# Patient Record
Sex: Female | Born: 1965
Health system: Southern US, Community
[De-identification: ages and names within clinical notes are randomized; demographics above are authoritative.]

## PROBLEM LIST (undated history)

## (undated) DIAGNOSIS — E039 Hypothyroidism, unspecified: Secondary | ICD-10-CM

## (undated) DIAGNOSIS — S060XAA Concussion with loss of consciousness status unknown, initial encounter: Secondary | ICD-10-CM

## (undated) DIAGNOSIS — G4700 Insomnia, unspecified: Secondary | ICD-10-CM

## (undated) DIAGNOSIS — G459 Transient cerebral ischemic attack, unspecified: Secondary | ICD-10-CM

## (undated) DIAGNOSIS — E079 Disorder of thyroid, unspecified: Secondary | ICD-10-CM

## (undated) DIAGNOSIS — Z21 Asymptomatic human immunodeficiency virus [HIV] infection status: Secondary | ICD-10-CM

## (undated) DIAGNOSIS — J45909 Unspecified asthma, uncomplicated: Secondary | ICD-10-CM

## (undated) DIAGNOSIS — F32A Depression, unspecified: Secondary | ICD-10-CM

## (undated) DIAGNOSIS — M5030 Other cervical disc degeneration, unspecified cervical region: Secondary | ICD-10-CM

## (undated) DIAGNOSIS — I10 Essential (primary) hypertension: Secondary | ICD-10-CM

## (undated) DIAGNOSIS — G709 Myoneural disorder, unspecified: Secondary | ICD-10-CM

## (undated) DIAGNOSIS — F419 Anxiety disorder, unspecified: Secondary | ICD-10-CM

## (undated) DIAGNOSIS — B20 Human immunodeficiency virus [HIV] disease: Secondary | ICD-10-CM

## (undated) DIAGNOSIS — M1990 Unspecified osteoarthritis, unspecified site: Secondary | ICD-10-CM

## (undated) DIAGNOSIS — T7840XA Allergy, unspecified, initial encounter: Secondary | ICD-10-CM

## (undated) DIAGNOSIS — M797 Fibromyalgia: Secondary | ICD-10-CM

## (undated) DIAGNOSIS — Z5900 Homelessness unspecified: Secondary | ICD-10-CM

## (undated) DIAGNOSIS — Z226 Carrier of human T-lymphotropic virus type-1 [HTLV-1] infection: Secondary | ICD-10-CM

## (undated) DIAGNOSIS — S060X9A Concussion with loss of consciousness of unspecified duration, initial encounter: Secondary | ICD-10-CM

## (undated) DIAGNOSIS — F329 Major depressive disorder, single episode, unspecified: Secondary | ICD-10-CM

## (undated) HISTORY — DX: Allergy, unspecified, initial encounter: T78.40XA

## (undated) HISTORY — DX: Carrier of human t-lymphotropic virus type-1 (htlv-1) infection: Z22.6

## (undated) HISTORY — DX: Concussion with loss of consciousness of unspecified duration, initial encounter: S06.0X9A

## (undated) HISTORY — DX: Human immunodeficiency virus (HIV) disease: B20

## (undated) HISTORY — DX: Essential (primary) hypertension: I10

## (undated) HISTORY — DX: Asymptomatic human immunodeficiency virus (hiv) infection status: Z21

## (undated) HISTORY — DX: Homelessness unspecified: Z59.00

## (undated) HISTORY — DX: Other cervical disc degeneration, unspecified cervical region: M50.30

## (undated) HISTORY — DX: Disorder of thyroid, unspecified: E07.9

## (undated) HISTORY — DX: Insomnia, unspecified: G47.00

## (undated) HISTORY — DX: Unspecified osteoarthritis, unspecified site: M19.90

## (undated) HISTORY — DX: Myoneural disorder, unspecified: G70.9

## (undated) HISTORY — PX: BREAST BIOPSY: SHX20

## (undated) HISTORY — DX: Unspecified asthma, uncomplicated: J45.909

## (undated) HISTORY — DX: Depression, unspecified: F32.A

## (undated) HISTORY — DX: Concussion with loss of consciousness status unknown, initial encounter: S06.0XAA

## (undated) HISTORY — PX: ABDOMINAL HYSTERECTOMY: SHX81

## (undated) HISTORY — DX: Fibromyalgia: M79.7

## (undated) HISTORY — DX: Hypothyroidism, unspecified: E03.9

## (undated) HISTORY — DX: Anxiety disorder, unspecified: F41.9

## (undated) HISTORY — DX: Major depressive disorder, single episode, unspecified: F32.9

## (undated) HISTORY — DX: Homelessness: Z59.0

---
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
## 1995-06-26 HISTORY — PX: URACHAL CYST EXCISION: SUR579

## 1995-06-26 HISTORY — PX: HERNIA REPAIR: SHX51

## 2004-06-25 HISTORY — PX: CHOLECYSTECTOMY: SHX55

## 2013-06-25 HISTORY — PX: TUMOR EXCISION: SHX421

## 2015-03-29 DIAGNOSIS — M7711 Lateral epicondylitis, right elbow: Secondary | ICD-10-CM | POA: Insufficient documentation

## 2015-07-27 HISTORY — PX: COLONOSCOPY: SHX174

## 2016-09-29 ENCOUNTER — Emergency Department (HOSPITAL_COMMUNITY): Payer: Medicare (Managed Care)

## 2016-09-29 ENCOUNTER — Emergency Department (HOSPITAL_COMMUNITY)
Admission: EM | Admit: 2016-09-29 | Discharge: 2016-09-29 | Disposition: A | Discharge status: Home / Self Care | Payer: Medicare (Managed Care) | Attending: Emergency Medicine | Admitting: Emergency Medicine

## 2016-09-29 DIAGNOSIS — Z79899 Other long term (current) drug therapy: Secondary | ICD-10-CM | POA: Insufficient documentation

## 2016-09-29 DIAGNOSIS — R109 Unspecified abdominal pain: Secondary | ICD-10-CM

## 2016-09-29 DIAGNOSIS — M797 Fibromyalgia: Secondary | ICD-10-CM | POA: Diagnosis not present

## 2016-09-29 DIAGNOSIS — R319 Hematuria, unspecified: Secondary | ICD-10-CM | POA: Diagnosis not present

## 2016-09-29 LAB — CBC WITH DIFFERENTIAL/PLATELET
Basophils Absolute: 0.1 10*3/uL (ref 0.0–0.1)
Basophils Relative: 1 %
EOS ABS: 0.2 10*3/uL (ref 0.0–0.7)
EOS PCT: 2 %
HCT: 40.4 % (ref 36.0–46.0)
Hemoglobin: 13.1 g/dL (ref 12.0–15.0)
LYMPHS ABS: 4.9 10*3/uL — AB (ref 0.7–4.0)
Lymphocytes Relative: 47 %
MCH: 26.7 pg (ref 26.0–34.0)
MCHC: 32.4 g/dL (ref 30.0–36.0)
MCV: 82.4 fL (ref 78.0–100.0)
MONO ABS: 0.6 10*3/uL (ref 0.1–1.0)
Monocytes Relative: 6 %
Neutro Abs: 4.5 10*3/uL (ref 1.7–7.7)
Neutrophils Relative %: 44 %
PLATELETS: 362 10*3/uL (ref 150–400)
RBC: 4.9 MIL/uL (ref 3.87–5.11)
RDW: 13.5 % (ref 11.5–15.5)
WBC: 10.2 10*3/uL (ref 4.0–10.5)

## 2016-09-29 LAB — COMPREHENSIVE METABOLIC PANEL
ALK PHOS: 71 U/L (ref 38–126)
ALT: 15 U/L (ref 14–54)
AST: 19 U/L (ref 15–41)
Albumin: 3.8 g/dL (ref 3.5–5.0)
Anion gap: 11 (ref 5–15)
BUN: 7 mg/dL (ref 6–20)
CALCIUM: 9.3 mg/dL (ref 8.9–10.3)
CO2: 25 mmol/L (ref 22–32)
CREATININE: 0.89 mg/dL (ref 0.44–1.00)
Chloride: 104 mmol/L (ref 101–111)
Glucose, Bld: 95 mg/dL (ref 65–99)
Potassium: 3.6 mmol/L (ref 3.5–5.1)
SODIUM: 140 mmol/L (ref 135–145)
TOTAL PROTEIN: 7 g/dL (ref 6.5–8.1)
Total Bilirubin: 0.9 mg/dL (ref 0.3–1.2)

## 2016-09-29 LAB — RAPID URINE DRUG SCREEN, HOSP PERFORMED
Amphetamines: NOT DETECTED
Barbiturates: NOT DETECTED
Benzodiazepines: POSITIVE — AB
Cocaine: NOT DETECTED
OPIATES: NOT DETECTED
Tetrahydrocannabinol: NOT DETECTED

## 2016-09-29 LAB — URINALYSIS, ROUTINE W REFLEX MICROSCOPIC
BILIRUBIN URINE: NEGATIVE
Glucose, UA: NEGATIVE mg/dL
KETONES UR: NEGATIVE mg/dL
LEUKOCYTES UA: NEGATIVE
Nitrite: NEGATIVE
PH: 5 (ref 5.0–8.0)
Protein, ur: 30 mg/dL — AB
Specific Gravity, Urine: 1.026 (ref 1.005–1.030)

## 2016-09-29 LAB — LIPASE, BLOOD

## 2016-09-29 LAB — POC URINE PREG, ED: PREG TEST UR: NEGATIVE

## 2016-09-29 MED ORDER — MORPHINE SULFATE (PF) 4 MG/ML IV SOLN
4.0000 mg | Freq: Once | INTRAVENOUS | Status: AC
  Administered 2016-09-29: 4 mg via INTRAVENOUS
  Filled 2016-09-29: qty 1

## 2016-09-29 MED ORDER — PREDNISONE 20 MG PO TABS: 40.0000 mg | ORAL_TABLET | Freq: Every day | ORAL | 0 refills | Status: DC

## 2016-09-29 MED ORDER — OXYCODONE-ACETAMINOPHEN 5-325 MG PO TABS: 1.0000 | ORAL_TABLET | ORAL | 0 refills | Status: DC | PRN

## 2016-09-29 MED ORDER — KETOROLAC TROMETHAMINE 30 MG/ML IJ SOLN
30.0000 mg | Freq: Once | INTRAMUSCULAR | Status: AC
  Administered 2016-09-29: 30 mg via INTRAVENOUS
  Filled 2016-09-29: qty 1

## 2016-09-29 NOTE — ED Provider Notes (Signed)
[facility]-EMERGENCY DEPT Provider Note   CSN: [id number] Arrival date & time: 09/29/16  0143     History   Chief Complaint Chief Complaint  Patient presents with  . Flank Pain    HPI Brianna Roberts is a 51 y.o. female with a hx of HLT1, fibromyalgia, HTN, kidney stones presents to the Emergency Department complaining of gradual, persistent, progressively worsening right shoulder pain onset several months ago after an MRI.  She denies falls or trauma.  Pt reports taking Amitriptyline for her pain and using a tens unit without relief. She reports taking oxycodone for breakthrough pain, but has not taken the 1 tablet she has left.  Associated symptoms include right flank pain and dysuria onset 24 hrs ago.  Pt denies urinary urgency, frequency and hematuria.  She reports she normally requires Toradol and morphine for her pain.  Pt reports she relocated to the area 6 mos ago and has not yet found a PCP to manage her chronic conditions.  Nothing makes it better and movement and palpation makes it worse.  Pt denies fever, chills, headache, neck pain, chest pain, abd pain, N/V/D, weakness, dizziness, syncope.     The history is provided by the patient and medical records. No language interpreter was used.    No past medical history on file.  There are no active problems to display for this patient.   No past surgical history on file.  OB History    No data available       Home Medications    Prior to Admission medications   Medication Sig Start Date End Date Taking? Authorizing Provider  acyclovir (ZOVIRAX) 200 MG capsule Take 200 mg by mouth 2 (two) times daily.   Yes Historical Provider, MD  albuterol (PROVENTIL HFA;VENTOLIN HFA) 108 (90 Base) MCG/ACT inhaler Inhale 1-2 puffs into the lungs every 6 (six) hours as needed for wheezing or shortness of breath.   Yes Historical Provider, MD  albuterol (PROVENTIL) (2.5 MG/3ML) 0.083% nebulizer solution Take 2.5 mg by nebulization every 6 (six)  hours as needed for wheezing or shortness of breath.   Yes Historical Provider, MD  amitriptyline (ELAVIL) 50 MG tablet Take 50 mg by mouth at bedtime.   Yes Historical Provider, MD  budesonide-formoterol (SYMBICORT) 160-4.5 MCG/ACT inhaler Inhale 2 puffs into the lungs 2 (two) times daily.   Yes Historical Provider, MD  diazepam (VALIUM) 5 MG tablet Take 5 mg by mouth every 8 (eight) hours as needed for anxiety.   Yes Historical Provider, MD  fluticasone (FLONASE) 50 MCG/ACT nasal spray Place 1 spray into both nostrils daily.   Yes Historical Provider, MD  furosemide (LASIX) 40 MG tablet Take 40 mg by mouth 2 (two) times daily.   Yes Historical Provider, MD  hydrOXYzine (ATARAX/VISTARIL) 10 MG tablet Take 10 mg by mouth at bedtime.   Yes Historical Provider, MD  ipratropium-albuterol (DUONEB) 0.5-2.5 (3) MG/3ML SOLN Take 3 mLs by nebulization every 6 (six) hours as needed (for breathing).   Yes Historical Provider, MD  levothyroxine (SYNTHROID, LEVOTHROID) 25 MCG tablet Take 25 mcg by mouth daily before breakfast.   Yes Historical Provider, MD  losartan (COZAAR) 100 MG tablet Take 100 mg by mouth daily.   Yes Historical Provider, MD  metoprolol (LOPRESSOR) 100 MG tablet Take 100 mg by mouth 2 (two) times daily.   Yes Historical Provider, MD  oxyCODONE (OXY IR/ROXICODONE) 5 MG immediate release tablet Take 5 mg by mouth every 4 (four) hours as needed for severe  pain.   Yes Historical Provider, MD  potassium chloride SA (K-DUR,KLOR-CON) 20 MEQ tablet Take 20 mEq by mouth 2 (two) times daily.   Yes Historical Provider, MD  Probiotic Product (ALIGN) 4 MG CAPS Take 4 mg by mouth daily.   Yes Historical Provider, MD  sertraline (ZOLOFT) 100 MG tablet Take 100 mg by mouth daily.   Yes Historical Provider, MD  oxyCODONE-acetaminophen (PERCOCET/ROXICET) 5-325 MG tablet Take 1 tablet by mouth every 4 (four) hours as needed for severe pain. 09/29/16   Kanaya Gunnarson, PA-C  predniSONE (DELTASONE) 20 MG  tablet Take 2 tablets (40 mg total) by mouth daily. 09/29/16   Dahlia Client Hendrick Pavich, PA-C    Family History No family history on file.  Social History Social History  Substance Use Topics  . Smoking status: Not on file  . Smokeless tobacco: Not on file  . Alcohol use Not on file     Allergies   Sulfa antibiotics; Codeine; Dilaudid [hydromorphone hcl]; Penicillins; and Spironolactone   Review of Systems Review of Systems  Constitutional: Negative for chills and fever.  Respiratory: Negative for shortness of breath.   Gastrointestinal: Negative for abdominal pain, diarrhea and nausea.  Genitourinary: Positive for flank pain. Negative for dysuria and urgency.  Musculoskeletal: Positive for arthralgias (right shoulder) and myalgias. Negative for gait problem and neck pain.  All other systems reviewed and are negative.    Physical Exam Updated Vital Signs BP 128/74   Pulse 93   Temp 98.3 F (36.8 C) (Oral)   Resp 18   Ht  (1.753 m)   Wt (!) 147.9 kg   SpO2 95%   BMI 48.14 kg/m   Physical Exam  Constitutional: She appears well-developed and well-nourished. No distress.  Awake, alert, nontoxic appearance Obese  HENT:  Head: Normocephalic and atraumatic.  Mouth/Throat: Oropharynx is clear and moist. No oropharyngeal exudate.  Eyes: Conjunctivae are normal. No scleral icterus.  Neck: Normal range of motion. Neck supple.  Cardiovascular: Normal rate, regular rhythm and intact distal pulses.   Pulmonary/Chest: Effort normal and breath sounds normal. No respiratory distress. She has no wheezes.  Equal chest expansion  Abdominal: Soft. Bowel sounds are normal. She exhibits no mass. There is no tenderness. There is CVA tenderness. There is no rebound and no guarding.  Musculoskeletal: Normal range of motion. She exhibits no edema.  Decreased ROM of the C, T and L spine due to pain Pt refuses to move or use her right arm stating it is too painful, but is able to assist  her left arm with removing her shirt.; no swelling, erythema or induration; no deformity.  Pt winces and pulls back at the slightest touch of her right arm, shoulder, and paraspinal muscles  Neurological: She is alert.  Speech is clear and goal oriented Moves extremities without ataxia  Skin: Skin is warm and dry. She is not diaphoretic.  Psychiatric: Her mood appears anxious.  Nursing note and vitals reviewed.    ED Treatments / Results  Labs (all labs ordered are listed, but only abnormal results are displayed) Labs Reviewed  URINALYSIS, ROUTINE W REFLEX MICROSCOPIC - Abnormal; Notable for the following:       Result Value   APPearance HAZY (*)    Hgb urine dipstick SMALL (*)    Protein, ur 30 (*)    Bacteria, UA FEW (*)    Squamous Epithelial / LPF 0-5 (*)    All other components within normal limits  CBC WITH DIFFERENTIAL/PLATELET -  Abnormal; Notable for the following:    Lymphs Abs 4.9 (*)    All other components within normal limits  LIPASE, BLOOD - Abnormal; Notable for the following:    Lipase <10 (*)    All other components within normal limits  RAPID URINE DRUG SCREEN, HOSP PERFORMED - Abnormal; Notable for the following:    Benzodiazepines POSITIVE (*)    All other components within normal limits  COMPREHENSIVE METABOLIC PANEL  POC URINE PREG, ED    Radiology Ct Renal Stone Study  Result Date: 09/29/2016 CLINICAL DATA:  Right flank pain x1 month.  History of renal stones. EXAM: CT ABDOMEN AND PELVIS WITHOUT CONTRAST TECHNIQUE: Multidetector CT imaging of the abdomen and pelvis was performed following the standard protocol without IV contrast. COMPARISON:  None. FINDINGS: Lower chest: Normal visualized cardiac chamber size without pericardial effusion. Clear lung bases. Hepatobiliary: Fatty infiltration of the liver. Cholecystectomy. No space-occupying mass of the liver noted. No biliary dilatation is visualized. Pancreas: Unremarkable. No pancreatic ductal dilatation  or surrounding inflammatory changes. Spleen: Normal in size without focal abnormality. Adrenals/Urinary Tract: Normal bilateral adrenal glands. No hydroureteronephrosis on either side. No obstructing calculus. A few punctate calcifications are suggested bilaterally without obstruction. These may represent tiny renal calculi or vascular calcifications. The urinary bladder is physiologically distended without stones or mass. Stomach/Bowel: Stomach is within normal limits. Appendix appears normal. No evidence of bowel wall thickening, distention, or inflammatory changes. Vascular/Lymphatic: No significant vascular findings are present. No enlarged abdominal or pelvic lymph nodes. Reproductive: Status post hysterectomy. No adnexal masses. Other: Pelvic calcifications consistent phleboliths are noted in the lower pelvis, one just anterior to the region the urethra but believed not to be within the urethra lack of any signs of collecting system or bladder obstruction. Musculoskeletal: No acute or significant osseous findings. IMPRESSION: 1. No obstructive uropathy. Tiny punctate calcifications are suggested within both kidneys which may represent vascular or nonobstructing calcifications. 2. No acute bowel inflammation. 3. Cholecystectomy and hysterectomy. 4. Normal-appearing appendix. Electronically Signed   By: Tollie Eth M.D.   On: 09/29/2016 03:31    Procedures Procedures (including critical care time)  Medications Ordered in ED Medications  morphine 4 MG/ML injection 4 mg (4 mg Intravenous Given 09/29/16 0259)  ketorolac (TORADOL) 30 MG/ML injection 30 mg (30 mg Intravenous Given 09/29/16 0418)     Initial Impression / Assessment and Plan / ED Course  I have reviewed the triage vital signs and the nursing notes.  Pertinent labs & imaging results that were available during my care of the patient were reviewed by me and considered in my medical decision making (see chart for details).     Labs  reassuring. No leukocytosis. Urine with small amount hemoglobin. Urine exiting positive for benzodiazepines. CT scan is without obstructive uropathy.  There is no acute bowel inflammation and normal appearing appendix. Patient's pain managed with morphine and Toradol per her request.  She is requesting prednisone at discharge as this helps her fibromyalgia. Patient without known diabetes. Blood sugar within normal today. Will give burst. Patient also discharged with small prescription of oxycodone. Long discussion about need for primary care physician.  The ED will not refill her oxycodone for chronic pain. She will be discharged home to follow-up with primary care physician.  BP 128/74   Pulse 93   Temp 98.3 F (36.8 C) (Oral)   Resp 18   Ht  (1.753 m)   Wt (!) 147.9 kg   SpO2 95%  BMI 48.14 kg/m    Final Clinical Impressions(s) / ED Diagnoses   Final diagnoses:  Right flank pain  Fibromyalgia affecting shoulder region  Hematuria, unspecified type    New Prescriptions New Prescriptions   OXYCODONE-ACETAMINOPHEN (PERCOCET/ROXICET) 5-325 MG TABLET    Take 1 tablet by mouth every 4 (four) hours as needed for severe pain.   PREDNISONE (DELTASONE) 20 MG TABLET    Take 2 tablets (40 mg total) by mouth daily.     Dahlia Client Recardo Linn, PA-C 09/29/16 0430    Melene Plan, DO 09/29/16 [phone]

## 2016-09-29 NOTE — ED Notes (Signed)
Patient transported to CT 

## 2016-09-29 NOTE — ED Triage Notes (Signed)
Pt complaining of R flank pain x 1 month. Pt states hx of kidney stones, states feels same. Pt also states need PCP. Told to see PCP monthly, has not had a PCP x 6 months. Pt with multiple complaints.

## 2016-09-29 NOTE — Discharge Instructions (Signed)
1. Medications: prednisone, oxycodone, usual home medications 2. Treatment: rest, drink plenty of fluids,  3. Follow Up: Please followup with your primary doctor in 2-3 days for discussion of your diagnoses and further evaluation after today's visit; if you do not have a primary care doctor use the resource guide provided to find one; Please return to the ER for high fevers, intractable vomiting, worsening symptoms

## 2016-10-05 ENCOUNTER — Ambulatory Visit (INDEPENDENT_AMBULATORY_CARE_PROVIDER_SITE_OTHER): Payer: Medicare Other | Admitting: Physician Assistant

## 2016-10-05 VITALS — BP 144/86 | HR 75 | Temp 99.3°F | Resp 17 | Ht 68.5 in | Wt 328.0 lb

## 2016-10-05 DIAGNOSIS — F419 Anxiety disorder, unspecified: Secondary | ICD-10-CM | POA: Insufficient documentation

## 2016-10-05 DIAGNOSIS — I10 Essential (primary) hypertension: Secondary | ICD-10-CM | POA: Insufficient documentation

## 2016-10-05 DIAGNOSIS — R600 Localized edema: Secondary | ICD-10-CM | POA: Diagnosis not present

## 2016-10-05 DIAGNOSIS — K580 Irritable bowel syndrome with diarrhea: Secondary | ICD-10-CM

## 2016-10-05 DIAGNOSIS — N83201 Unspecified ovarian cyst, right side: Secondary | ICD-10-CM

## 2016-10-05 DIAGNOSIS — M7711 Lateral epicondylitis, right elbow: Secondary | ICD-10-CM

## 2016-10-05 DIAGNOSIS — E039 Hypothyroidism, unspecified: Secondary | ICD-10-CM | POA: Diagnosis not present

## 2016-10-05 DIAGNOSIS — F0781 Postconcussional syndrome: Secondary | ICD-10-CM

## 2016-10-05 DIAGNOSIS — G43809 Other migraine, not intractable, without status migrainosus: Secondary | ICD-10-CM

## 2016-10-05 DIAGNOSIS — M797 Fibromyalgia: Secondary | ICD-10-CM

## 2016-10-05 DIAGNOSIS — J309 Allergic rhinitis, unspecified: Secondary | ICD-10-CM

## 2016-10-05 DIAGNOSIS — Z0100 Encounter for examination of eyes and vision without abnormal findings: Secondary | ICD-10-CM

## 2016-10-05 DIAGNOSIS — N83209 Unspecified ovarian cyst, unspecified side: Secondary | ICD-10-CM | POA: Insufficient documentation

## 2016-10-05 DIAGNOSIS — Z6841 Body Mass Index (BMI) 40.0 and over, adult: Secondary | ICD-10-CM

## 2016-10-05 DIAGNOSIS — B028 Zoster with other complications: Secondary | ICD-10-CM

## 2016-10-05 DIAGNOSIS — G9332 Myalgic encephalomyelitis/chronic fatigue syndrome: Secondary | ICD-10-CM | POA: Insufficient documentation

## 2016-10-05 DIAGNOSIS — M5030 Other cervical disc degeneration, unspecified cervical region: Secondary | ICD-10-CM

## 2016-10-05 DIAGNOSIS — K589 Irritable bowel syndrome without diarrhea: Secondary | ICD-10-CM | POA: Insufficient documentation

## 2016-10-05 DIAGNOSIS — J3089 Other allergic rhinitis: Secondary | ICD-10-CM

## 2016-10-05 DIAGNOSIS — Z09 Encounter for follow-up examination after completed treatment for conditions other than malignant neoplasm: Secondary | ICD-10-CM

## 2016-10-05 DIAGNOSIS — B333 Retrovirus infections, not elsewhere classified: Secondary | ICD-10-CM | POA: Diagnosis not present

## 2016-10-05 DIAGNOSIS — G43909 Migraine, unspecified, not intractable, without status migrainosus: Secondary | ICD-10-CM | POA: Insufficient documentation

## 2016-10-05 DIAGNOSIS — R5382 Chronic fatigue, unspecified: Secondary | ICD-10-CM

## 2016-10-05 DIAGNOSIS — Z01419 Encounter for gynecological examination (general) (routine) without abnormal findings: Secondary | ICD-10-CM

## 2016-10-05 DIAGNOSIS — N200 Calculus of kidney: Secondary | ICD-10-CM

## 2016-10-05 DIAGNOSIS — Z7689 Persons encountering health services in other specified circumstances: Secondary | ICD-10-CM

## 2016-10-05 DIAGNOSIS — B029 Zoster without complications: Secondary | ICD-10-CM | POA: Insufficient documentation

## 2016-10-05 DIAGNOSIS — A609 Anogenital herpesviral infection, unspecified: Secondary | ICD-10-CM

## 2016-10-05 DIAGNOSIS — F329 Major depressive disorder, single episode, unspecified: Secondary | ICD-10-CM

## 2016-10-05 DIAGNOSIS — L509 Urticaria, unspecified: Secondary | ICD-10-CM

## 2016-10-05 DIAGNOSIS — F32A Depression, unspecified: Secondary | ICD-10-CM | POA: Insufficient documentation

## 2016-10-05 DIAGNOSIS — G4733 Obstructive sleep apnea (adult) (pediatric): Secondary | ICD-10-CM | POA: Insufficient documentation

## 2016-10-05 DIAGNOSIS — J45909 Unspecified asthma, uncomplicated: Secondary | ICD-10-CM

## 2016-10-05 DIAGNOSIS — G4700 Insomnia, unspecified: Secondary | ICD-10-CM

## 2016-10-05 DIAGNOSIS — R531 Weakness: Secondary | ICD-10-CM

## 2016-10-05 HISTORY — DX: Localized edema: R60.0

## 2016-10-05 HISTORY — DX: Unspecified asthma, uncomplicated: J45.909

## 2016-10-05 HISTORY — DX: Allergic rhinitis, unspecified: J30.9

## 2016-10-05 HISTORY — DX: Migraine, unspecified, not intractable, without status migrainosus: G43.909

## 2016-10-05 HISTORY — DX: Unspecified ovarian cyst, unspecified side: N83.209

## 2016-10-05 MED ORDER — ARIPIPRAZOLE 10 MG PO TABS: 10.0000 mg | ORAL_TABLET | Freq: Every day | ORAL | 0 refills | Status: DC

## 2016-10-05 MED ORDER — LEVOTHYROXINE SODIUM 25 MCG PO TABS: 25.0000 ug | ORAL_TABLET | Freq: Every day | ORAL | 3 refills | Status: DC

## 2016-10-05 MED ORDER — ALBUTEROL SULFATE HFA 108 (90 BASE) MCG/ACT IN AERS: 1.0000 | INHALATION_SPRAY | Freq: Four times a day (QID) | RESPIRATORY_TRACT | 3 refills | Status: DC | PRN

## 2016-10-05 MED ORDER — DIAZEPAM 5 MG PO TABS: 5.0000 mg | ORAL_TABLET | Freq: Three times a day (TID) | ORAL | 0 refills | Status: DC | PRN

## 2016-10-05 MED ORDER — ACYCLOVIR 200 MG PO CAPS: 200.0000 mg | ORAL_CAPSULE | Freq: Two times a day (BID) | ORAL | 3 refills | Status: DC

## 2016-10-05 MED ORDER — SERTRALINE HCL 100 MG PO TABS
100.0000 mg | ORAL_TABLET | Freq: Every day | ORAL | 3 refills | Status: DC
Start: 2016-10-05 — End: 2017-08-09

## 2016-10-05 MED ORDER — FUROSEMIDE 40 MG PO TABS: 40.0000 mg | ORAL_TABLET | Freq: Two times a day (BID) | ORAL | 3 refills | Status: DC

## 2016-10-05 MED ORDER — FLUTICASONE PROPIONATE 50 MCG/ACT NA SUSP: 1.0000 | Freq: Every day | NASAL | 3 refills | Status: DC

## 2016-10-05 MED ORDER — OXYCODONE HCL 5 MG PO TABS: 5.0000 mg | ORAL_TABLET | ORAL | 0 refills | Status: DC | PRN

## 2016-10-05 MED ORDER — LOSARTAN POTASSIUM 100 MG PO TABS
100.0000 mg | ORAL_TABLET | Freq: Every day | ORAL | 3 refills | Status: DC
Start: 2016-10-05 — End: 2017-08-09

## 2016-10-05 MED ORDER — HYDROXYZINE HCL 10 MG PO TABS: 10.0000 mg | ORAL_TABLET | Freq: Every day | ORAL | 3 refills | Status: DC

## 2016-10-05 MED ORDER — METOPROLOL TARTRATE 100 MG PO TABS: 100.0000 mg | ORAL_TABLET | Freq: Two times a day (BID) | ORAL | 3 refills | Status: DC

## 2016-10-05 MED ORDER — IPRATROPIUM-ALBUTEROL 0.5-2.5 (3) MG/3ML IN SOLN: 3.0000 mL | Freq: Four times a day (QID) | RESPIRATORY_TRACT | 3 refills | Status: DC | PRN

## 2016-10-05 MED ORDER — AMITRIPTYLINE HCL 50 MG PO TABS: 50.0000 mg | ORAL_TABLET | Freq: Every day | ORAL | 3 refills | Status: DC

## 2016-10-05 NOTE — Progress Notes (Signed)
Patient ID: Brianna Roberts, female     DOB: 1966/01/31, 51 y.o.    MRN: [id number]  PCP: No PCP Per Patient  Chief Complaint  Patient presents with  . Establish Care  . Hospitalization Follow-up    Subjective:   This patient is new to this practice and presents for medication refills, hospital follow-up and to establish care. She is accompanied by her nurse case manager, [place].  History is provided by the patient, who is a fair-to-good historian and through review in [place].  She was seen in the ED with flank pain, thought due to nephrolithiasis, on 09/29/2016 and encouraged to establish for primary care. She also needs multiple specialty referrals. In [place] she was followed by: PCP, rheumatology, infectious disease, neurology, pain management, GYN, GI, orthopedics, psychiatry/psychotherapy, ophthalmology, allergy, sleep medicine and had regular PT/OT.  Husband died of lymphoma in 2012/08/04, due to HTLV-1 in August 04, 2012. She is enrolled in an HTLV study at the [facility]. Moved here from [place] because she needed a change. Has lost about 60 lbs, working to lose more.  "I need to get out of this flare up. It's been 3 months and I'm tired of it."  Had her records sent to another practice where she was to establish, but the provider there declined to see her due to the complexity of her medical issues.  Review of Systems  Constitutional: Positive for fatigue. Negative for activity change, appetite change, chills, diaphoresis, fever and unexpected weight change.  HENT: Positive for congestion, postnasal drip, rhinorrhea and sinus pressure. Negative for dental problem, drooling, ear discharge, ear pain, facial swelling, hearing loss, mouth sores, nosebleeds, sinus pain, sneezing, sore throat, tinnitus, trouble swallowing and voice change.        Allergies are worse here than in [place]  Eyes: Negative.   Respiratory: Negative for apnea, cough, choking, chest tightness, shortness of  breath, wheezing and stridor.        Asthma is controlled  Cardiovascular: Positive for leg swelling. Negative for chest pain and palpitations.  Gastrointestinal: Negative for abdominal distention, abdominal pain, anal bleeding, blood in stool, constipation, diarrhea, nausea, rectal pain and vomiting.       IBS-D is controlled  Endocrine: Negative.   Genitourinary: Negative.   Musculoskeletal: Positive for myalgias. Negative for gait problem and joint swelling.  Allergic/Immunologic: Positive for environmental allergies and immunocompromised state. Negative for food allergies.  Neurological: Positive for weakness (LEFT sided) and headaches.  Hematological: Negative.   Psychiatric/Behavioral: Positive for dysphoric mood and sleep disturbance. The patient is nervous/anxious.    Depression screen PHQ 2/9 10/05/2016  Decreased Interest 0  Down, Depressed, Hopeless 1  PHQ - 2 Score 1     Prior to Admission medications   Medication Sig Start Date End Date Taking? Authorizing Provider  acyclovir (ZOVIRAX) 200 MG capsule Take 200 mg by mouth 2 (two) times daily.   Yes Historical Provider, MD  albuterol (PROVENTIL HFA;VENTOLIN HFA) 108 (90 Base) MCG/ACT inhaler Inhale 1-2 puffs into the lungs every 6 (six) hours as needed for wheezing or shortness of breath.   Yes Historical Provider, MD  albuterol (PROVENTIL) (2.5 MG/3ML) 0.083% nebulizer solution Take 2.5 mg by nebulization every 6 (six) hours as needed for wheezing or shortness of breath.   Yes Historical Provider, MD  amitriptyline (ELAVIL) 50 MG tablet Take 50 mg by mouth at bedtime.   Yes Historical Provider, MD  ARIPiprazole (ABILIFY) 10 MG tablet Take 10 mg by mouth daily.  Yes Historical Provider, MD  budesonide-formoterol (SYMBICORT) 160-4.5 MCG/ACT inhaler Inhale 2 puffs into the lungs 2 (two) times daily.   Yes Historical Provider, MD  diazepam (VALIUM) 5 MG tablet Take 5 mg by mouth every 8 (eight) hours as needed for anxiety.   Yes  Historical Provider, MD  fluticasone (FLONASE) 50 MCG/ACT nasal spray Place 1 spray into both nostrils daily.   Yes Historical Provider, MD  furosemide (LASIX) 40 MG tablet Take 40 mg by mouth 2 (two) times daily.   Yes Historical Provider, MD  hydrOXYzine (ATARAX/VISTARIL) 10 MG tablet Take 10 mg by mouth at bedtime.   Yes Historical Provider, MD  ipratropium-albuterol (DUONEB) 0.5-2.5 (3) MG/3ML SOLN Take 3 mLs by nebulization every 6 (six) hours as needed (for breathing).   Yes Historical Provider, MD  levothyroxine (SYNTHROID, LEVOTHROID) 25 MCG tablet Take 25 mcg by mouth daily before breakfast.   Yes Historical Provider, MD  losartan (COZAAR) 100 MG tablet Take 100 mg by mouth daily.   Yes Historical Provider, MD  metoprolol (LOPRESSOR) 100 MG tablet Take 100 mg by mouth 2 (two) times daily.   Yes Historical Provider, MD  oxyCODONE (OXY IR/ROXICODONE) 5 MG immediate release tablet Take 5 mg by mouth every 4 (four) hours as needed for severe pain.   Yes Historical Provider, MD  oxyCODONE-acetaminophen (PERCOCET/ROXICET) 5-325 MG tablet Take 1 tablet by mouth every 4 (four) hours as needed for severe pain. 09/29/16  Yes Hannah Muthersbaugh, PA-C  potassium chloride SA (K-DUR,KLOR-CON) 20 MEQ tablet Take 20 mEq by mouth 2 (two) times daily.   Yes Historical Provider, MD  predniSONE (DELTASONE) 20 MG tablet Take 2 tablets (40 mg total) by mouth daily. 09/29/16  Yes Hannah Muthersbaugh, PA-C  Probiotic Product (ALIGN) 4 MG CAPS Take 4 mg by mouth daily.   Yes Historical Provider, MD  sertraline (ZOLOFT) 100 MG tablet Take 100 mg by mouth daily.   Yes Historical Provider, MD     Allergies  Allergen Reactions  . Sulfa Antibiotics Anaphylaxis  . Codeine Other (See Comments)    Unknown   . Dilaudid [Hydromorphone Hcl] Itching  . Penicillins Other (See Comments)    Unknown   . Spironolactone Other (See Comments)    "GI Issues"     Patient Active Problem List   Diagnosis Date Noted  . 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 Fibromyalgia 10/05/2016  . Benign essential HTN 10/05/2016  . Hypothyroidism 10/05/2016  . Allergic rhinitis 10/05/2016  . Urticaria 10/05/2016  . Chronic fatigue syndrome 10/05/2016  . Asthma, chronic 10/05/2016  . HSV (herpes simplex virus) anogenital infection 10/05/2016  . Shingles 10/05/2016  . Anxiety and depression 10/05/2016  . Bilateral leg edema 10/05/2016  . Ovarian cyst 10/05/2016  . Nephrolithiasis 10/05/2016  . IBS (irritable bowel syndrome) 10/05/2016  . OSA (obstructive sleep apnea) 10/05/2016  . Left-sided weakness 10/05/2016  . Migraine 10/05/2016  . Arm weakness 03/29/2015     Family History  Problem Relation Age of Onset  . Cancer Mother 74    breast cancer  . Diabetes Mother   . Heart disease Mother   . Hyperlipidemia Mother   . Hypertension Mother   . Stroke Mother   . Mental illness Mother   . Heart failure Mother   . Headache Mother     Rocky Morel Cyst  . Heart disease Father   . Alcohol abuse Father   . Hypertension Sister   . Heart disease Brother   .  Mental illness Brother   . COPD Brother   . Heart failure Brother   . Hypertension Sister      Social History   Social History  . Marital status: Widowed    Spouse name: Ferdie Ping  . Number of children: 0  . Years of education: college+   Occupational History  . disability     fibromyalgia   Social History Main Topics  . Smoking status: Current Every Day Smoker    Packs/day: 0.30    Years: 33.00    Types: Cigarettes  . Smokeless tobacco: Never Used  . Alcohol use No  . Drug use: No  . Sexual activity: No   Other Topics Concern  . Not on file   Social History Narrative   Moved to [place], [place] from [place], [place] 04/28/2016 in need of a change.   Her family remains in [place].   Lives alone.         Objective:  Physical Exam  Constitutional: She is oriented to person, place, and time.  She appears well-developed and well-nourished. She is active and cooperative. No distress.  BP (!) 144/86 (BP Location: Left Arm, Patient Position: Sitting, Cuff Size: Large)   Pulse 75   Temp 99.3 F (37.4 C) (Oral)   Resp 17   Ht 5' 8.5" (1.74 m)   Wt (!) 328 lb (148.8 kg)   SpO2 94%   BMI 49.15 kg/m    Eyes: Conjunctivae are normal.  Pulmonary/Chest: Effort normal.  Neurological: She is alert and oriented to person, place, and time.  Psychiatric: She has a normal mood and affect. Her speech is normal and behavior is normal.             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      Relevant Medications   acyclovir (ZOVIRAX) 200 MG capsule   Other Relevant Orders   Ambulatory referral to Infectious Disease   Fibromyalgia    Continue amitriptyline. Refer to rheumatology. Care Everywhere notes seropositive RA, but the patient does not recall that, only that her previous clinician told her that the studies were normal. I see Sjogren's antibodies (negative), ANA (negative), ESR (11).      Relevant Medications   amitriptyline (ELAVIL) 50 MG tablet   oxyCODONE (OXY IR/ROXICODONE) 5 MG immediate release tablet   Other Relevant Orders   Ambulatory referral to Rheumatology   Benign essential HTN    Above goal today, but records indicate previous control. Continue current regimen, encourage consistent CPAP use. If remains elevated at next visit, will increase drug therapy.      Relevant Medications   metoprolol (LOPRESSOR) 100 MG tablet   losartan (COZAAR) 100 MG tablet   furosemide (LASIX) 40 MG tablet   Hypothyroidism    TSH was normal in 12/2015. Continue low dose levothyroxine. Will repeat when update other labs.      Relevant Medications   metoprolol (LOPRESSOR) 100 MG tablet   levothyroxine (SYNTHROID,  LEVOTHROID) 25 MCG tablet   Allergic rhinitis    Worse since relocating here from [place], but stable. Continue current treatment.      Relevant Medications   fluticasone (FLONASE) 50 MCG/ACT nasal spray   Urticaria    Will review Care Everywhere documents to understand work-up thus far and rationale for not prescribing an  Epipen. Though if she is really experiencing sensation that her throat is closing several times a week, trigger is certainly needed for avoidance/treatment.      Relevant Medications   hydrOXYzine (ATARAX/VISTARIL) 10 MG tablet   Other Relevant Orders   Ambulatory referral to Allergy   Chronic fatigue syndrome    Not discussed at this visit.      Relevant Orders   Ambulatory referral to Allergy   Asthma, chronic    Controlled. Recent prednisone for unrelated issue likely helped with exacerbation due to increased allergy symptoms. Continue current treatment. Refer to local allergy specialist.      Relevant Medications   ipratropium-albuterol (DUONEB) 0.5-2.5 (3) MG/3ML SOLN   albuterol (PROVENTIL HFA;VENTOLIN HFA) 108 (90 Base) MCG/ACT inhaler   HSV (herpes simplex virus) anogenital infection    Continue acyclovir.      Relevant Medications   acyclovir (ZOVIRAX) 200 MG capsule   Shingles    Will likely recommend Shingrix.      Relevant Medications   acyclovir (ZOVIRAX) 200 MG capsule   Anxiety and depression    Continue current treatment for now. Refer to establish with local psychiatry and psychology.      Relevant Medications   sertraline (ZOLOFT) 100 MG tablet   diazepam (VALIUM) 5 MG tablet   ARIPiprazole (ABILIFY) 10 MG tablet   Other Relevant Orders   Ambulatory referral to Psychiatry   Bilateral leg edema    Continue Lasix.      Relevant Medications   furosemide (LASIX) 40 MG tablet   Ovarian cyst    Can be evaluated with GYN.      Nephrolithiasis    Given recurrent symptoms and no objective finding beyond hematuria, recommend  urology evaluation.      Relevant Medications   oxyCODONE (OXY IR/ROXICODONE) 5 MG immediate release tablet   Other Relevant Orders   Ambulatory referral to Urology   Right lateral epicondylitis    Requests orthopedics referral, but wants to hold off initially, given the multiple other specialists she'll be establishing with.      Relevant Medications   oxyCODONE (OXY IR/ROXICODONE) 5 MG immediate release tablet   IBS (irritable bowel syndrome)    Currently stable. Refer to GI to establish local specialist. Colonoscopy 10/03/2015.      Relevant Orders   Ambulatory referral to Gastroenterology   OSA (obstructive sleep apnea)    Continue efforts for weight loss and consistent CPAP use. Requests referral to local sleep specialist. Is working with her nurse case manager to get replacement parts for her set up that have malfunctioned.      Relevant Orders   Ambulatory referral to Sleep Studies   Left-sided weakness    This diagnosis was obtained via Care Everywhere and was not discussed with the patient at this visit.      Relevant Orders   Ambulatory referral to Neurology   Migraine    Refer to neurology. Encourage consistent CPAP use. Continue amitriptyline.      Relevant Medications   sertraline (ZOLOFT) 100 MG tablet   metoprolol (LOPRESSOR) 100 MG tablet   losartan (COZAAR) 100 MG tablet   furosemide (LASIX) 40 MG tablet   amitriptyline (ELAVIL) 50 MG tablet   oxyCODONE (OXY IR/ROXICODONE) 5 MG immediate release tablet   Other Relevant Orders   Ambulatory referral to Neurology   DDD (degenerative disc disease), cervical    This diagnosis was obtained via review of Care Everywhere and not discussed with the patient at  this visit.      Relevant Medications   oxyCODONE (OXY IR/ROXICODONE) 5 MG immediate release tablet   Insomnia    No specific treatment used. No new treatment provided.      BMI 45.0-49.9, adult [facility])    Certainly contributes to multiple issues for  her. Has successfully lost 50 lbs through lifestyle changes. Will plan to refer to [facility] in the future.      Post concussion syndrome    This diagnosis was obtained via Care Everywhere and not discussed at this visit.      Relevant Medications   sertraline (ZOLOFT) 100 MG tablet   hydrOXYzine (ATARAX/VISTARIL) 10 MG tablet   diazepam (VALIUM) 5 MG tablet   ARIPiprazole (ABILIFY) 10 MG tablet   amitriptyline (ELAVIL) 50 MG tablet    Other Visit Diagnoses    Encounter to establish care    -  Primary   Encounter for examination following treatment at hospital       Routine eye exam       Relevant Orders   Ambulatory referral to Ophthalmology   Encounter for annual routine gynecological examination       Relevant Orders   Ambulatory referral to Gynecology       Return in about 6 weeks (around 11/16/2016) for re-evaluation of blood pressure .   Fara Chute, PA-C [facility]

## 2016-10-05 NOTE — Patient Instructions (Signed)
We recommend that you schedule a mammogram for breast cancer screening. Typically, you do not need a referral to do this. Please contact a local imaging center to schedule your mammogram.  [facility] - [phone]  *ask for the Radiology Department [facility]) - [phone] or [phone]  [facility] - [phone] [facility] - [phone] [facility] - [phone]  *ask for the Radiology Department [facility] - [phone]  *ask for the Radiology Department [facility] - [phone]  *ask for the Mammography Department [facility] - [phone]     IF you received an x-ray today, you will receive an invoice from [facility]. Please contact [facility] at [phone] with questions or concerns regarding your invoice.   IF you received labwork today, you will receive an invoice from [facility]. Please contact [facility] at [phone] with questions or concerns regarding your invoice.   Our billing staff will not be able to assist you with questions regarding bills from these companies.  You will be contacted with the lab results as soon as they are available. The fastest way to get your results is to activate your My Chart account. Instructions are located on the last page of this paperwork. If you have not heard from us regarding the results in 2 weeks, please contact this office.      

## 2016-10-06 DIAGNOSIS — F0781 Postconcussional syndrome: Secondary | ICD-10-CM | POA: Insufficient documentation

## 2016-10-06 NOTE — Assessment & Plan Note (Signed)
Will review Care Everywhere documents to understand work-up thus far and rationale for not prescribing an Epipen. Though if she is really experiencing sensation that her throat is closing several times a week, trigger is certainly needed for avoidance/treatment.

## 2016-10-06 NOTE — Assessment & Plan Note (Signed)
This diagnosis was obtained via Care Everywhere and was not discussed with the patient at this visit.

## 2016-10-06 NOTE — Assessment & Plan Note (Signed)
Refer to neurology. Encourage consistent CPAP use. Continue amitriptyline.

## 2016-10-06 NOTE — Assessment & Plan Note (Addendum)
TSH was normal in 12/2015. Continue low dose levothyroxine. Will repeat when update other labs.

## 2016-10-06 NOTE — Assessment & Plan Note (Signed)
Continue amitriptyline. Refer to rheumatology. Care Everywhere notes seropositive RA, but the patient does not recall that, only that her previous clinician told her that the studies were normal. I see Sjogren's antibodies (negative), ANA (negative), ESR (11).

## 2016-10-06 NOTE — Assessment & Plan Note (Signed)
Will likely recommend Shingrix.

## 2016-10-06 NOTE — Assessment & Plan Note (Signed)
Not discussed at this visit.

## 2016-10-06 NOTE — Assessment & Plan Note (Signed)
Continue current treatment for now. Refer to establish with local psychiatry and psychology.

## 2016-10-06 NOTE — Assessment & Plan Note (Signed)
Certainly contributes to multiple issues for her. Has successfully lost 50 lbs through lifestyle changes. Will plan to refer to [facility] in the future.

## 2016-10-06 NOTE — Assessment & Plan Note (Signed)
Currently stable. Refer to GI to establish local specialist. Colonoscopy 10/03/2015.

## 2016-10-06 NOTE — Assessment & Plan Note (Signed)
Refer to local ID specialist to establish care for potential future needs.

## 2016-10-06 NOTE — Assessment & Plan Note (Signed)
Requests orthopedics referral, but wants to hold off initially, given the multiple other specialists she'll be establishing with.

## 2016-10-06 NOTE — Assessment & Plan Note (Signed)
Continue acyclovir °

## 2016-10-06 NOTE — Assessment & Plan Note (Signed)
Controlled. Recent prednisone for unrelated issue likely helped with exacerbation due to increased allergy symptoms. Continue current treatment. Refer to local allergy specialist.

## 2016-10-06 NOTE — Assessment & Plan Note (Addendum)
Continue efforts for weight loss and consistent CPAP use. Requests referral to local sleep specialist. Is working with her nurse case manager to get replacement parts for her set up that have malfunctioned.

## 2016-10-06 NOTE — Assessment & Plan Note (Signed)
Above goal today, but records indicate previous control. Continue current regimen, encourage consistent CPAP use. If remains elevated at next visit, will increase drug therapy.

## 2016-10-06 NOTE — Assessment & Plan Note (Signed)
Continue Lasix 

## 2016-10-06 NOTE — Assessment & Plan Note (Signed)
Worse since relocating here from [place], but stable. Continue current treatment.

## 2016-10-06 NOTE — Assessment & Plan Note (Signed)
This diagnosis was obtained via Care Everywhere and not discussed at this visit.

## 2016-10-06 NOTE — Assessment & Plan Note (Signed)
Given recurrent symptoms and no objective finding beyond hematuria, recommend urology evaluation.

## 2016-10-06 NOTE — Assessment & Plan Note (Signed)
This diagnosis was obtained via review of Care Everywhere and not discussed with the patient at this visit.

## 2016-10-06 NOTE — Assessment & Plan Note (Signed)
Can be evaluated with GYN.

## 2016-10-06 NOTE — Assessment & Plan Note (Signed)
No specific treatment used. No new treatment provided.

## 2016-10-08 ENCOUNTER — Encounter: Payer: Self-pay | Admitting: Neurology

## 2016-10-29 ENCOUNTER — Ambulatory Visit (INDEPENDENT_AMBULATORY_CARE_PROVIDER_SITE_OTHER): Payer: Medicare Other

## 2016-10-29 ENCOUNTER — Ambulatory Visit (INDEPENDENT_AMBULATORY_CARE_PROVIDER_SITE_OTHER): Payer: Medicare Other | Admitting: Physician Assistant

## 2016-10-29 DIAGNOSIS — M25511 Pain in right shoulder: Secondary | ICD-10-CM

## 2016-10-29 DIAGNOSIS — B029 Zoster without complications: Secondary | ICD-10-CM

## 2016-10-29 DIAGNOSIS — B372 Candidiasis of skin and nail: Secondary | ICD-10-CM | POA: Diagnosis not present

## 2016-10-29 DIAGNOSIS — N3943 Post-void dribbling: Secondary | ICD-10-CM

## 2016-10-29 LAB — POCT URINALYSIS DIP (MANUAL ENTRY)
BILIRUBIN UA: NEGATIVE
GLUCOSE UA: NEGATIVE mg/dL
Ketones, POC UA: NEGATIVE mg/dL
Leukocytes, UA: NEGATIVE
Nitrite, UA: NEGATIVE
PROTEIN UA: NEGATIVE mg/dL
Spec Grav, UA: >=1.03 — AB (ref 1.010–1.025)
UROBILINOGEN UA: 0.2 U/dL
pH, UA: 5.5 (ref 5.0–8.0)

## 2016-10-29 LAB — POC MICROSCOPIC URINALYSIS (UMFC): MUCUS RE: ABSENT

## 2016-10-29 MED ORDER — NYSTATIN 100000 UNIT/GM EX POWD: Freq: Two times a day (BID) | CUTANEOUS | 99 refills | Status: DC

## 2016-10-29 MED ORDER — NYSTATIN 100000 UNIT/GM EX CREA: 1.0000 "application " | TOPICAL_CREAM | Freq: Two times a day (BID) | CUTANEOUS | 99 refills | Status: DC

## 2016-10-29 MED ORDER — MELOXICAM 15 MG PO TABS: 15.0000 mg | ORAL_TABLET | Freq: Every day | ORAL | 0 refills | Status: DC

## 2016-10-29 NOTE — Patient Instructions (Addendum)
The calcium oxalate crystals in the urine support kidney stones, though there is no blood, and so any recent stone is likely passed, or not yet in the ureter. There is not current infection. Follow-up with urology is the next step.  The shoulder films are NORMAL, which is good, but does not rule out a possible tendon injury. Try the meloxicam. No ibuprofen or naproxen while you take it. If it doesn't help, let me know and we'll refer you to orthopedics. You may need an MRI.      IF you received an x-ray today, you will receive an invoice from [facility]. Please contact [facility] at [phone] with questions or concerns regarding your invoice.   IF you received labwork today, you will receive an invoice from [facility]. Please contact [facility] at [phone] with questions or concerns regarding your invoice.   Our billing staff will not be able to assist you with questions regarding bills from these companies.  You will be contacted with the lab results as soon as they are available. The fastest way to get your results is to activate your My Chart account. Instructions are located on the last page of this paperwork. If you have not heard from us regarding the results in 2 weeks, please contact this office.    We recommend that you schedule a mammogram for breast cancer screening. Typically, you do not need a referral to do this. Please contact a local imaging center to schedule your mammogram.  [facility] - [phone]  *ask for the Radiology Department [facility] - [phone]) [phone] or [phone]) [phone]  [facility] - [phone] [facility] - [phone]) [phone] [facility] - [phone]  *ask for the Radiology Department [facility] - [phone](336) [phone]  *ask for the Radiology Department [facility] - [phone]) [phone]  *ask for the Mammography Department [facility] - [phone]  [phone]
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
## 2016-10-31 NOTE — Assessment & Plan Note (Addendum)
Appears to have a resolving cluster of vesicles on the back. This is her typical presentation, and she continues daily suppressive therapy with antiviral. Still considering Shingrix vaccine, but patient wants to sort out the specialty visits first.

## 2016-11-14 ENCOUNTER — Telehealth: Payer: Self-pay | Admitting: Family Medicine

## 2016-11-14 NOTE — Telephone Encounter (Signed)
PT CALLING TO LET CHELLE KNOW THAT SHE ISNT ABLE TO COME BACK HERE DUE TO HER [employer] INSURANCE AND SHE APPRECIATE EVERYTHING THAT CHELLE HAS DONE FOR HER

## 2016-11-15 NOTE — Telephone Encounter (Signed)
Difficulty with her insurance coverage. I am not in their network, and so to see me she has to pay OOP. Also cannot see the specialists I referred her to, because I am not in network. They had set her up for a visit with someone else in a couple of weeks.  Happy to see her at any time she needs.

## 2016-11-17 ENCOUNTER — Emergency Department (HOSPITAL_COMMUNITY)
Admission: EM | Admit: 2016-11-17 | Discharge: 2016-11-17 | Disposition: A | Discharge status: Home / Self Care | Payer: Medicare (Managed Care) | Attending: Emergency Medicine | Admitting: Emergency Medicine

## 2016-11-17 ENCOUNTER — Emergency Department (HOSPITAL_COMMUNITY): Payer: Medicare (Managed Care)

## 2016-11-17 ENCOUNTER — Encounter (HOSPITAL_COMMUNITY): Payer: Self-pay

## 2016-11-17 DIAGNOSIS — R109 Unspecified abdominal pain: Secondary | ICD-10-CM | POA: Diagnosis not present

## 2016-11-17 DIAGNOSIS — I10 Essential (primary) hypertension: Secondary | ICD-10-CM | POA: Diagnosis not present

## 2016-11-17 DIAGNOSIS — E039 Hypothyroidism, unspecified: Secondary | ICD-10-CM | POA: Diagnosis not present

## 2016-11-17 DIAGNOSIS — J45909 Unspecified asthma, uncomplicated: Secondary | ICD-10-CM | POA: Diagnosis not present

## 2016-11-17 DIAGNOSIS — Z79899 Other long term (current) drug therapy: Secondary | ICD-10-CM | POA: Insufficient documentation

## 2016-11-17 DIAGNOSIS — M791 Myalgia, unspecified site: Secondary | ICD-10-CM

## 2016-11-17 DIAGNOSIS — F17210 Nicotine dependence, cigarettes, uncomplicated: Secondary | ICD-10-CM | POA: Diagnosis not present

## 2016-11-17 LAB — CBC WITH DIFFERENTIAL/PLATELET
BASOS ABS: 0.1 10*3/uL (ref 0.0–0.1)
BASOS PCT: 1 %
EOS PCT: 1 %
Eosinophils Absolute: 0.1 10*3/uL (ref 0.0–0.7)
HCT: 38.5 % (ref 36.0–46.0)
Hemoglobin: 12.6 g/dL (ref 12.0–15.0)
Lymphocytes Relative: 48 %
Lymphs Abs: 4.7 10*3/uL — ABNORMAL HIGH (ref 0.7–4.0)
MCH: 27.3 pg (ref 26.0–34.0)
MCHC: 32.7 g/dL (ref 30.0–36.0)
MCV: 83.3 fL (ref 78.0–100.0)
MONO ABS: 0.5 10*3/uL (ref 0.1–1.0)
Monocytes Relative: 5 %
NEUTROS ABS: 4.3 10*3/uL (ref 1.7–7.7)
Neutrophils Relative %: 45 %
Platelets: 324 10*3/uL (ref 150–400)
RBC: 4.62 MIL/uL (ref 3.87–5.11)
RDW: 14 % (ref 11.5–15.5)
WBC: 9.7 10*3/uL (ref 4.0–10.5)

## 2016-11-17 LAB — I-STAT CHEM 8, ED
BUN: 7 mg/dL (ref 6–20)
CALCIUM ION: 1.13 mmol/L — AB (ref 1.15–1.40)
CHLORIDE: 101 mmol/L (ref 101–111)
CREATININE: 0.9 mg/dL (ref 0.44–1.00)
GLUCOSE: 89 mg/dL (ref 65–99)
HCT: 37 % (ref 36.0–46.0)
Hemoglobin: 12.6 g/dL (ref 12.0–15.0)
Potassium: 3.3 mmol/L — ABNORMAL LOW (ref 3.5–5.1)
Sodium: 139 mmol/L (ref 135–145)
TCO2: 28 mmol/L (ref 0–100)

## 2016-11-17 LAB — URINALYSIS, ROUTINE W REFLEX MICROSCOPIC
BILIRUBIN URINE: NEGATIVE
GLUCOSE, UA: NEGATIVE mg/dL
HGB URINE DIPSTICK: NEGATIVE
KETONES UR: NEGATIVE mg/dL
LEUKOCYTES UA: NEGATIVE
NITRITE: NEGATIVE
PH: 5 (ref 5.0–8.0)
Protein, ur: 30 mg/dL — AB
RBC / HPF: NONE SEEN RBC/hpf (ref 0–5)
SPECIFIC GRAVITY, URINE: 1.03 (ref 1.005–1.030)

## 2016-11-17 LAB — I-STAT BETA HCG BLOOD, ED (MC, WL, AP ONLY): I-stat hCG, quantitative: <5 m[IU]/mL (ref <5)

## 2016-11-17 MED ORDER — METHOCARBAMOL 500 MG PO TABS: 500.0000 mg | ORAL_TABLET | Freq: Two times a day (BID) | ORAL | 0 refills | Status: DC

## 2016-11-17 MED ORDER — OXYCODONE-ACETAMINOPHEN 5-325 MG PO TABS
ORAL_TABLET | ORAL | Status: AC
  Filled 2016-11-17: qty 1

## 2016-11-17 MED ORDER — KETOROLAC TROMETHAMINE 60 MG/2ML IM SOLN
30.0000 mg | Freq: Once | INTRAMUSCULAR | Status: AC
  Administered 2016-11-17: 30 mg via INTRAMUSCULAR
  Filled 2016-11-17: qty 2

## 2016-11-17 MED ORDER — MELOXICAM 15 MG PO TABS: 15.0000 mg | ORAL_TABLET | Freq: Every day | ORAL | 0 refills | Status: DC

## 2016-11-17 MED ORDER — METHOCARBAMOL 500 MG PO TABS
1000.0000 mg | ORAL_TABLET | Freq: Once | ORAL | Status: AC
  Administered 2016-11-17: 1000 mg via ORAL
  Filled 2016-11-17: qty 2

## 2016-11-17 MED ORDER — OXYCODONE-ACETAMINOPHEN 5-325 MG PO TABS
1.0000 | ORAL_TABLET | ORAL | Status: DC | PRN
  Administered 2016-11-17: 1 via ORAL

## 2016-11-17 NOTE — ED Notes (Signed)
P 

## 2016-11-17 NOTE — ED Notes (Signed)
Pt tolerating PO fluids with no nausea or vomiting.

## 2016-11-17 NOTE — ED Notes (Signed)
Patient transported to CT 

## 2016-11-17 NOTE — ED Triage Notes (Signed)
Pt states hx of kidney stones. Pt states feels same. Pt states unable to void. Pt complaining of R flank pain.

## 2016-11-17 NOTE — ED Notes (Signed)
Bladder scan performed only 113 mL of urine.

## 2016-11-17 NOTE — ED Provider Notes (Signed)
[facility]-EMERGENCY DEPT Provider Note   CSN: [id number] Arrival date & time: 11/17/16  7829 By signing my name below, I, Levon Hedger, attest that this documentation has been prepared under the direction and in the presence of Heitor Steinhoff, MD . Electronically Signed: Levon Hedger, Scribe. 11/17/2016. 4:09 AM.   History   Chief Complaint Chief Complaint  Patient presents with  . Flank Pain   HPI Brianna Roberts is a 51 y.o. female with a history of HTN, fibromyalgia, and nephrolithiasis who presents to the Emergency Department complaining of acute on chronic flank pain which worsened two days ago.  She reports associated decreased urine output, and states she has been unable to void for 2 days. No alleviating or modifying factors noted.  She has taken her regular chronic pain medications with no significant relief of symptoms. Pt reports that she has had issues with her kidneys for about one year, but has had difficulty establishing care. Pt states she was referred to urology, but is not in network with any urologists in the area. She is currently searching for a PCP. Pt has no other acute complaints or associated symptoms at this time.    The history is provided by the patient. No language interpreter was used.  Flank Pain  This is a chronic problem. The current episode started 2 days ago. The problem occurs constantly. The problem has been gradually worsening. Pertinent negatives include no chest pain, no abdominal pain, no headaches and no shortness of breath. Nothing aggravates the symptoms. Nothing relieves the symptoms.   Past Medical History:  Diagnosis Date  . Allergy   . Anxiety   . Arthritis   . Asthma   . 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 Concussion   . DDD (degenerative disc disease), cervical   . Depression   . Homelessness   . Hypertension   . Insomnia   . Neuromuscular disorder (HCC)   . Thyroid disease     Patient Active Problem  List   Diagnosis Date Noted  . Post concussion syndrome 10/06/2016  . 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 Fibromyalgia 10/05/2016  . Benign essential HTN 10/05/2016  . Hypothyroidism 10/05/2016  . Allergic rhinitis 10/05/2016  . Urticaria 10/05/2016  . Chronic fatigue syndrome 10/05/2016  . Asthma, chronic 10/05/2016  . HSV (herpes simplex virus) anogenital infection 10/05/2016  . Shingles 10/05/2016  . Anxiety and depression 10/05/2016  . Bilateral leg edema 10/05/2016  . Ovarian cyst 10/05/2016  . Nephrolithiasis 10/05/2016  . IBS (irritable bowel syndrome) 10/05/2016  . OSA (obstructive sleep apnea) 10/05/2016  . Left-sided weakness 10/05/2016  . Migraine 10/05/2016  . BMI 45.0-49.9, adult (HCC) 10/05/2016  . DDD (degenerative disc disease), cervical   . Insomnia   . Right lateral epicondylitis 03/29/2015    Past Surgical History:  Procedure Laterality Date  . ABDOMINAL HYSTERECTOMY     Partial-ovaries and tubes remain  . CHOLECYSTECTOMY    . HERNIA REPAIR  1997  . TUMOR EXCISION Right 1984   Sinuses  . TUMOR EXCISION Left 2015   sinuses  . URACHAL CYST EXCISION  1997    OB History    No data available      Home Medications    Prior to Admission medications   Medication Sig Start Date End Date Taking? Authorizing Provider  acyclovir (ZOVIRAX) 200 MG capsule Take 1 capsule (200 mg total) by mouth 2 (two) times daily. 10/05/16   Leotis Shames,  Chelle, PA-C  albuterol (PROVENTIL HFA;VENTOLIN HFA) 108 (90 Base) MCG/ACT inhaler Inhale 1-2 puffs into the lungs every 6 (six) hours as needed for wheezing or shortness of breath. 10/05/16   Porfirio Oar, PA-C  amitriptyline (ELAVIL) 50 MG tablet Take 1 tablet (50 mg total) by mouth at bedtime. 10/05/16   Porfirio Oar, PA-C  ARIPiprazole (ABILIFY) 10 MG tablet Take 1 tablet (10 mg total) by mouth daily. 10/05/16   Porfirio Oar, PA-C  budesonide-formoterol (SYMBICORT) 160-4.5 MCG/ACT inhaler  Inhale 2 puffs into the lungs 2 (two) times daily.    [provider]  diazepam (VALIUM) 5 MG tablet Take 1 tablet (5 mg total) by mouth every 8 (eight) hours as needed for anxiety. 10/05/16   Porfirio Oar, PA-C  fluticasone (FLONASE) 50 MCG/ACT nasal spray Place 1 spray into both nostrils daily. 10/05/16   Porfirio Oar, PA-C  furosemide (LASIX) 40 MG tablet Take 1 tablet (40 mg total) by mouth 2 (two) times daily. 10/05/16   Porfirio Oar, PA-C  hydrOXYzine (ATARAX/VISTARIL) 10 MG tablet Take 1 tablet (10 mg total) by mouth at bedtime. 10/05/16   Jeffery, Chelle, PA-C  ipratropium-albuterol (DUONEB) 0.5-2.5 (3) MG/3ML SOLN Take 3 mLs by nebulization every 6 (six) hours as needed (for breathing). 10/05/16   Porfirio Oar, PA-C  levothyroxine (SYNTHROID, LEVOTHROID) 25 MCG tablet Take 1 tablet (25 mcg total) by mouth daily before breakfast. 10/05/16   Porfirio Oar, PA-C  losartan (COZAAR) 100 MG tablet Take 1 tablet (100 mg total) by mouth daily. 10/05/16   Porfirio Oar, PA-C  meloxicam (MOBIC) 15 MG tablet Take 1 tablet (15 mg total) by mouth daily. 10/29/16   Porfirio Oar, PA-C  metoprolol (LOPRESSOR) 100 MG tablet Take 1 tablet (100 mg total) by mouth 2 (two) times daily. 10/05/16   Porfirio Oar, PA-C  nystatin (MYCOSTATIN/NYSTOP) powder Apply topically 2 (two) times daily. 10/29/16 10/29/17  Porfirio Oar, PA-C  nystatin cream (MYCOSTATIN) Apply 1 application topically 2 (two) times daily. 10/29/16   Porfirio Oar, PA-C  oxyCODONE (OXY IR/ROXICODONE) 5 MG immediate release tablet Take 1 tablet (5 mg total) by mouth every 4 (four) hours as needed for severe pain. 10/05/16   Porfirio Oar, PA-C  potassium chloride SA (K-DUR,KLOR-CON) 20 MEQ tablet Take 20 mEq by mouth 2 (two) times daily.    [provider]  predniSONE (DELTASONE) 20 MG tablet Take 2 tablets (40 mg total) by mouth daily. Patient not taking: Reported on 10/29/2016 09/29/16   Muthersbaugh, Dahlia Client, PA-C    Probiotic Product (ALIGN) 4 MG CAPS Take 4 mg by mouth daily.    [provider]  sertraline (ZOLOFT) 100 MG tablet Take 1 tablet (100 mg total) by mouth daily. 10/05/16   Porfirio Oar, PA-C    Family History Family History  Problem Relation Age of Onset  . Cancer Mother 64       breast cancer  . Diabetes Mother   . Heart disease Mother   . Hyperlipidemia Mother   . Hypertension Mother   . Stroke Mother   . Mental illness Mother   . Heart failure Mother   . Headache Mother        Joellyn Quails Cyst  . Heart disease Father   . Alcohol abuse Father   . Hypertension Sister   . Heart disease Brother   . Mental illness Brother   . COPD Brother   . Heart failure Brother   . Hypertension Sister     Social History Social History  Substance Use Topics  . Smoking status: Current Every Day Smoker    Packs/day: 0.30    Years: 33.00    Types: Cigarettes  . Smokeless tobacco: Never Used  . Alcohol use No     Allergies   Sulfa antibiotics; Codeine; Dilaudid [hydromorphone hcl]; Penicillins; and Spironolactone   Review of Systems Review of Systems  Respiratory: Negative for shortness of breath.   Cardiovascular: Negative for chest pain.  Gastrointestinal: Negative for abdominal pain.  Genitourinary: Positive for decreased urine volume and flank pain.  Neurological: Negative for headaches.  All other systems reviewed and are negative.  Physical Exam Updated Vital Signs BP 134/88   Pulse 80   Temp 98.6 F (37 C) (Oral)   Resp 16   Physical Exam  Constitutional: She is oriented to person, place, and time. She appears well-developed and well-nourished. No distress.  HENT:  Head: Normocephalic and atraumatic.  Mouth/Throat: Oropharynx is clear and moist. No oropharyngeal exudate.  Moist mucous membranes   Eyes: Conjunctivae are normal. Pupils are equal, round, and reactive to light.  Neck: No JVD present.  Trachea midline No bruit  Cardiovascular: Normal  rate, regular rhythm and normal heart sounds.   Pulmonary/Chest: Effort normal and breath sounds normal. No stridor. No respiratory distress.  Abdominal: Soft. Bowel sounds are normal. She exhibits no distension.  Neurological: She is alert and oriented to person, place, and time. She has normal reflexes.  Skin: Skin is warm and dry.  Psychiatric: She has a normal mood and affect. Her behavior is normal.  Nursing note and vitals reviewed.  ED Treatments / Results  DIAGNOSTIC STUDIES:  Oxygen Saturation is 99% on RA, normal by my interpretation.    COORDINATION OF CARE:  3:50 AM Discussed treatment plan with pt at bedside and pt agreed to plan.   Labs (all labs ordered are listed, but only abnormal results are displayed) Labs Reviewed  URINALYSIS, ROUTINE W REFLEX MICROSCOPIC    EKG  EKG Interpretation None       Radiology No results found.  Procedures Procedures (including critical care time)  Medications Ordered in ED Medications  oxyCODONE-acetaminophen (PERCOCET/ROXICET) 5-325 MG per tablet 1 tablet (1 tablet Oral Given 11/17/16 0047)  oxyCODONE-acetaminophen (PERCOCET/ROXICET) 5-325 MG per tablet ( Oral Canceled Entry 11/17/16 0219)     Initial Impression / Assessment and Plan / ED Course  [place] DEA database  10/08/2016 10/05/2016 DIAZEPAM 5 MG TABLET [id number] 30 10 0 0 [id number] JEFFERY [address] [place], [place] [id number] GATE [address] [place], [place], Whitley 1965-09-23 [address] [place], [place] [address] 04 0 10/08/2016 10/05/2016 OXYCODONE HCL 5 MG TABLET [id number] 30 8 0 0 [id number] JEFFERY CHELLE [address] [place], [place] [id number] GATE [address] [place], [place], Astryd November 29, 1965 [address] [place], [place] [address] 04 28.13 09/29/2016 09/29/2016 OXYCODONE- ACETAMINOPHEN 5- 325 [id number] 6 1 0 0 [address] [place], [place] [id number] Jearld Fenton,  [place] Eve, Camelle A 12/22/1965 [address] [place], [place] [address] 03 45 06/14/2016 06/14/2016 DIAZEPAM 5 MG TABLET [id number] 90 30 0 0 [id number] Alyson Locket B MD Roni Bread, MD [id number] Jearld Fenton, [place] Orlov, Andraya A 19-Jan-1966 [address] [place], [place] [address] 03   Final Clinical Impressions(s) / ED Diagnoses  MSK flank pain:  There are no stones and this patient will not be getting narcotics for this ongoing issue.  I suspect there is a  component of drug seeking.  I will refill the mobic and start robaxin and the patient will need to follow up with her PMD.  She has had no ureteral stones on 2 visits and it is unclear if those are stones in the kidneys,  They are too deep and small to be causing her symptoms.   She patient is nontoxic-appearing on exam and vital signs are within normal limits.   Return immediately for fever >101, coldness of the extremity, numbness, intractable pain, weakness, bleeding or any concerns. Follow up with your own doctor for ongoing concerns.   The patient is nontoxic-appearing on exam and vital signs are within normal limits.   I have reviewed the triage vital signs and the nursing notes. Pertinent labs &imaging results that were available during my care of the patient were reviewed by me and considered in my medical decision making (see chart for details).  After history, exam, and medical workup I feel the patient has been appropriately medically screened and is safe for discharge home. Pertinent diagnoses were discussed with the patient. Patient was given return precautions.    I personally performed the services described in this documentation, which was scribed in my presence. The recorded information has been reviewed and is accurate.      Taariq Leitz, MD 11/17/16 [phone]

## 2016-11-17 NOTE — ED Notes (Signed)
Pt stated "it felt like fire" while urinating.

## 2016-11-22 ENCOUNTER — Telehealth: Payer: Self-pay | Admitting: Physician Assistant

## 2016-11-22 NOTE — Telephone Encounter (Signed)
Robby Sermonassandra Foy NP with [facility] states she test the pt this past  Tuesday for depression and she scored high, a 23.  She is not taking her Abilify and she encourage her to take it.  Cassandra contact number [phone]

## 2016-11-23 NOTE — Telephone Encounter (Signed)
I agree, the patient needs to take her medications. She also needs to be seen by a number of specialists, but is having difficulty with insurance coverage.  [employer] has arranged for her to see a provider in network.

## 2016-11-23 NOTE — Telephone Encounter (Signed)
Last seen 10/29/16

## 2016-11-26 ENCOUNTER — Institutional Professional Consult (permissible substitution): Payer: 59 | Admitting: Neurology

## 2016-12-06 ENCOUNTER — Encounter: Payer: Self-pay | Admitting: Physician Assistant

## 2016-12-06 NOTE — Telephone Encounter (Signed)
Pt has not gotten better with treatment.  Pt has given more specific detail in mychart message. Please advise with further instruction. [phone]

## 2016-12-10 ENCOUNTER — Ambulatory Visit: Payer: Medicare (Managed Care) | Admitting: Neurology

## 2016-12-10 ENCOUNTER — Encounter: Payer: Self-pay | Admitting: Physician Assistant

## 2016-12-17 ENCOUNTER — Encounter: Payer: Self-pay | Admitting: Gastroenterology

## 2016-12-25 ENCOUNTER — Encounter: Payer: Medicare Other | Admitting: Physician Assistant

## 2016-12-27 ENCOUNTER — Ambulatory Visit (INDEPENDENT_AMBULATORY_CARE_PROVIDER_SITE_OTHER): Payer: Medicare HMO | Admitting: Physician Assistant

## 2016-12-27 ENCOUNTER — Encounter: Payer: Self-pay | Admitting: Physician Assistant

## 2016-12-27 VITALS — BP 141/87 | HR 98 | Temp 98.9°F | Resp 16 | Ht 68.5 in | Wt 330.0 lb

## 2016-12-27 DIAGNOSIS — Z13220 Encounter for screening for lipoid disorders: Secondary | ICD-10-CM | POA: Diagnosis not present

## 2016-12-27 DIAGNOSIS — Z13228 Encounter for screening for other metabolic disorders: Secondary | ICD-10-CM

## 2016-12-27 DIAGNOSIS — Z7409 Other reduced mobility: Secondary | ICD-10-CM

## 2016-12-27 DIAGNOSIS — Z23 Encounter for immunization: Secondary | ICD-10-CM

## 2016-12-27 DIAGNOSIS — Z1231 Encounter for screening mammogram for malignant neoplasm of breast: Secondary | ICD-10-CM

## 2016-12-27 DIAGNOSIS — Z131 Encounter for screening for diabetes mellitus: Secondary | ICD-10-CM | POA: Diagnosis not present

## 2016-12-27 DIAGNOSIS — B372 Candidiasis of skin and nail: Secondary | ICD-10-CM

## 2016-12-27 DIAGNOSIS — J45909 Unspecified asthma, uncomplicated: Secondary | ICD-10-CM

## 2016-12-27 DIAGNOSIS — B029 Zoster without complications: Secondary | ICD-10-CM

## 2016-12-27 DIAGNOSIS — Z1389 Encounter for screening for other disorder: Secondary | ICD-10-CM | POA: Diagnosis not present

## 2016-12-27 DIAGNOSIS — Z0000 Encounter for general adult medical examination without abnormal findings: Secondary | ICD-10-CM | POA: Diagnosis not present

## 2016-12-27 DIAGNOSIS — N200 Calculus of kidney: Secondary | ICD-10-CM

## 2016-12-27 DIAGNOSIS — M797 Fibromyalgia: Secondary | ICD-10-CM | POA: Diagnosis not present

## 2016-12-27 DIAGNOSIS — F329 Major depressive disorder, single episode, unspecified: Secondary | ICD-10-CM

## 2016-12-27 DIAGNOSIS — Z1239 Encounter for other screening for malignant neoplasm of breast: Secondary | ICD-10-CM

## 2016-12-27 DIAGNOSIS — Z6841 Body Mass Index (BMI) 40.0 and over, adult: Secondary | ICD-10-CM

## 2016-12-27 DIAGNOSIS — I10 Essential (primary) hypertension: Secondary | ICD-10-CM

## 2016-12-27 DIAGNOSIS — F419 Anxiety disorder, unspecified: Secondary | ICD-10-CM

## 2016-12-27 DIAGNOSIS — F32A Depression, unspecified: Secondary | ICD-10-CM

## 2016-12-27 LAB — POCT URINALYSIS DIP (MANUAL ENTRY)
BILIRUBIN UA: NEGATIVE mg/dL
Bilirubin, UA: NEGATIVE
Blood, UA: NEGATIVE
Glucose, UA: NEGATIVE mg/dL
LEUKOCYTES UA: NEGATIVE
NITRITE UA: NEGATIVE
Spec Grav, UA: 1.02 (ref 1.010–1.025)
UROBILINOGEN UA: 0.2 U/dL
pH, UA: 6.5 (ref 5.0–8.0)

## 2016-12-27 MED ORDER — MELOXICAM 15 MG PO TABS: 15.0000 mg | ORAL_TABLET | Freq: Every day | ORAL | 0 refills | Status: DC

## 2016-12-27 MED ORDER — OXYCODONE HCL 5 MG PO TABS
5.0000 mg | ORAL_TABLET | ORAL | 0 refills | Status: DC | PRN
Start: 2016-12-27 — End: 2017-08-09

## 2016-12-27 MED ORDER — BUDESONIDE-FORMOTEROL FUMARATE 160-4.5 MCG/ACT IN AERO: 2.0000 | INHALATION_SPRAY | Freq: Two times a day (BID) | RESPIRATORY_TRACT | 3 refills | Status: DC

## 2016-12-27 MED ORDER — PRO COMFORT TENS UNIT DEVI: 1.0000 | Freq: Every day | 1 refills | Status: DC | PRN

## 2016-12-27 MED ORDER — ZOSTER VAC RECOMB ADJUVANTED 50 MCG/0.5ML IM SUSR: 0.5000 mL | Freq: Once | INTRAMUSCULAR | 1 refills | Status: AC

## 2016-12-27 MED ORDER — METHOCARBAMOL 500 MG PO TABS: 500.0000 mg | ORAL_TABLET | Freq: Two times a day (BID) | ORAL | 3 refills | Status: DC

## 2016-12-27 MED ORDER — NYSTATIN 100000 UNIT/GM EX POWD: Freq: Two times a day (BID) | CUTANEOUS | 99 refills | Status: AC

## 2016-12-27 MED ORDER — AMITRIPTYLINE HCL 75 MG PO TABS: 75.0000 mg | ORAL_TABLET | Freq: Every day | ORAL | 3 refills | Status: DC

## 2016-12-27 MED ORDER — DIAZEPAM 5 MG PO TABS: 5.0000 mg | ORAL_TABLET | Freq: Three times a day (TID) | ORAL | 0 refills | Status: DC | PRN

## 2016-12-27 NOTE — Progress Notes (Signed)
Subjective:    Patient ID: Brianna Roberts, female    DOB: Mar 22, 1966, 51 y.o.   MRN: [id number] PCP: Porfirio Oar, PA-C   HPI Brianna Roberts is a 51 year old female presenting for a complete physical examination.  Cervical cancer screening: not a candidate. S/p hysterectomy. No cervix. Breast cancer screening: scheduled next week. Colorectal cancer screening: Last colonoscopy in 2017. On 10 year plan.  Bone density testing: N/A HIV screening: due today STI screening: declines screening today. Has not been sexually active since 2008. Seasonal influenza vaccine: receives annually. Td/Tdap vaccination: defer for now. Pneumococcal vaccination:  Zoster vaccination: due.  Frequency of dental evaluation: has not seen in 2015. Would like referral.  Frequency of eye evaluation: has not been but has an appointment scheduled next month.  Pt's concerns today:  Fibromyalgia: Currently having some muscle spasms. Had an appointment scheduled 2 days ago but was in so much pain she was unable to come. Her right shoulder has really been bothering her. She reports having "fibro-fog." She feels feels she is being undertreated. Continuing oxycodone, meloxicam and amitriptyline 75mg . She will occasionally use Tylenol. She will occasionally valium use for muscle spasms as well as anxiety. Epicondylitis: Used to see a rheumatologist for cortisone shots. Still interested in pursuing a referral now that her insurance has changed. HTN: Occasionally takes home BPs on both arms. Systolics 128-175, diastolics 79-94. Migraines: Has not had in a very long time.  Anxiety and depression: Improving.  IBSD: Not taking probiotic. Stable. Seeing GI next month. Insomnia: Would like to re-initiate referral to sleep studies for CPAP.   Patient Active Problem List   Diagnosis Date Noted  . Post concussion syndrome 10/06/2016  . 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 Fibromyalgia 10/05/2016   . Benign essential HTN 10/05/2016  . Hypothyroidism 10/05/2016  . Allergic rhinitis 10/05/2016  . Urticaria 10/05/2016  . Chronic fatigue syndrome 10/05/2016  . Asthma, chronic 10/05/2016  . HSV (herpes simplex virus) anogenital infection 10/05/2016  . Shingles 10/05/2016  . Anxiety and depression 10/05/2016  . Bilateral leg edema 10/05/2016  . Ovarian cyst 10/05/2016  . Nephrolithiasis 10/05/2016  . IBS (irritable bowel syndrome) 10/05/2016  . OSA (obstructive sleep apnea) 10/05/2016  . Left-sided weakness 10/05/2016  . Migraine 10/05/2016  . BMI 45.0-49.9, adult (HCC) 10/05/2016  . DDD (degenerative disc disease), cervical   . Insomnia   . Right lateral epicondylitis 03/29/2015   Prior to Admission medications   Medication Sig Start Date End Date Taking? Authorizing Provider  acyclovir (ZOVIRAX) 200 MG capsule Take 1 capsule (200 mg total) by mouth 2 (two) times daily. 10/05/16  Yes Jeffery, Chelle, PA-C  albuterol (PROVENTIL HFA;VENTOLIN HFA) 108 (90 Base) MCG/ACT inhaler Inhale 1-2 puffs into the lungs every 6 (six) hours as needed for wheezing or shortness of breath. 10/05/16  Yes Jeffery, Chelle, PA-C  amitriptyline (ELAVIL) 50 MG tablet Take 1 tablet (50 mg total) by mouth at bedtime. 10/05/16  Yes Jeffery, Chelle, PA-C  ARIPiprazole (ABILIFY) 10 MG tablet Take 1 tablet (10 mg total) by mouth daily. 10/05/16  Yes Jeffery, Chelle, PA-C  budesonide-formoterol (SYMBICORT) 160-4.5 MCG/ACT inhaler Inhale 2 puffs into the lungs 2 (two) times daily.   Yes [provider]  diazepam (VALIUM) 5 MG tablet Take 1 tablet (5 mg total) by mouth every 8 (eight) hours as needed for anxiety. 10/05/16  Yes Jeffery, Chelle, PA-C  fluticasone (FLONASE) 50 MCG/ACT nasal spray Place 1 spray into both nostrils daily. 10/05/16  Yes Jeffery, Chelle, PA-C  furosemide (LASIX) 40 MG tablet Take 1 tablet (40 mg total) by mouth 2 (two) times daily. 10/05/16  Yes Jeffery, Chelle, PA-C  hydrOXYzine  (ATARAX/VISTARIL) 10 MG tablet Take 1 tablet (10 mg total) by mouth at bedtime. 10/05/16  Yes Jeffery, Chelle, PA-C  ipratropium-albuterol (DUONEB) 0.5-2.5 (3) MG/3ML SOLN Take 3 mLs by nebulization every 6 (six) hours as needed (for breathing). 10/05/16  Yes Jeffery, Chelle, PA-C  levothyroxine (SYNTHROID, LEVOTHROID) 25 MCG tablet Take 1 tablet (25 mcg total) by mouth daily before breakfast. 10/05/16  Yes Jeffery, Chelle, PA-C  losartan (COZAAR) 100 MG tablet Take 1 tablet (100 mg total) by mouth daily. 10/05/16  Yes Jeffery, Chelle, PA-C  meloxicam (MOBIC) 15 MG tablet Take 1 tablet (15 mg total) by mouth daily. 11/17/16  Yes Palumbo, April, MD  metoprolol (LOPRESSOR) 100 MG tablet Take 1 tablet (100 mg total) by mouth 2 (two) times daily. 10/05/16  Yes Jeffery, Chelle, PA-C  nystatin (MYCOSTATIN/NYSTOP) powder Apply topically 2 (two) times daily. 10/29/16 10/29/17 Yes Jeffery, Chelle, PA-C  nystatin cream (MYCOSTATIN) Apply 1 application topically 2 (two) times daily. 10/29/16  Yes Jeffery, Chelle, PA-C  oxyCODONE (OXY IR/ROXICODONE) 5 MG immediate release tablet Take 1 tablet (5 mg total) by mouth every 4 (four) hours as needed for severe pain. 10/05/16  Yes Jeffery, Chelle, PA-C  potassium chloride SA (K-DUR,KLOR-CON) 20 MEQ tablet Take 20 mEq by mouth 2 (two) times daily.   Yes [provider]  predniSONE (DELTASONE) 20 MG tablet Take 2 tablets (40 mg total) by mouth daily. 09/29/16  Yes Muthersbaugh, Dahlia Client, PA-C  Probiotic Product (ALIGN) 4 MG CAPS Take 4 mg by mouth daily.   Yes [provider]  sertraline (ZOLOFT) 100 MG tablet Take 1 tablet (100 mg total) by mouth daily. 10/05/16  Yes Jeffery, Chelle, PA-C  methocarbamol (ROBAXIN) 500 MG tablet Take 1 tablet (500 mg total) by mouth 2 (two) times daily. Patient not taking: Reported on 12/27/2016 11/17/16   Nicanor Alcon, April, MD   Allergies  Allergen Reactions  . Sulfa Antibiotics Anaphylaxis  . Codeine Other (See Comments)    Unknown   .  Dilaudid [Hydromorphone Hcl] Itching  . Penicillins Other (See Comments)    Unknown   . Spironolactone Other (See Comments)    "GI Issues"     Review of Systems     Objective:   Physical Exam  Constitutional: She appears well-developed and well-nourished.  BP (!) 141/87   Pulse 98   Temp 98.9 F (37.2 C) (Oral)   Resp 16   Ht 5' 8.5" (1.74 m)   Wt (!) 330 lb (149.7 kg)   SpO2 96%   BMI 49.45 kg/m    Patient is obese.  HENT:  Head: Normocephalic and atraumatic.  Right Ear: External ear normal.  Left Ear: External ear normal.  Nose: Nose normal.  Mouth/Throat: Oropharynx is clear and moist.  Eyes: Conjunctivae and EOM are normal. Pupils are equal, round, and reactive to light.  Neck: Normal range of motion. Neck supple. No thyromegaly present.  Cardiovascular: Normal rate, regular rhythm and normal heart sounds.   Pulses:      Radial pulses are 2+ on the right side, and 2+ on the left side.       Dorsalis pedis pulses are 2+ on the right side, and 2+ on the left side.       Right posterior tibial pulse not accessible and left posterior tibial pulse not accessible.  Pulmonary/Chest: Effort normal and breath sounds normal.  Abdominal: Soft. Normal appearance and bowel sounds are normal.  Difficult to palpate due to body habitus.  Genitourinary: No breast swelling, tenderness, discharge or bleeding. Pelvic exam was performed with patient supine.  Musculoskeletal:       Right shoulder: She exhibits tenderness. She exhibits normal range of motion, no swelling and no crepitus.       Left shoulder: She exhibits normal range of motion, no tenderness, no swelling and no crepitus.  Lymphadenopathy:       Head (right side): No submental, no submandibular, no tonsillar, no preauricular, no posterior auricular and no occipital adenopathy present.       Head (left side): No submental, no submandibular, no tonsillar, no preauricular, no posterior auricular and no occipital adenopathy  present.    She has no cervical adenopathy.  Neurological: She is alert. She has normal reflexes.  Skin: Skin is warm and dry. No bruising noted.  Nystatin powder noted in folds of skin around lower abdomen.  Psychiatric: She has a normal mood and affect. Her behavior is normal.   Dep[facility] 12/27/2016 10/29/2016 10/05/2016  Decreased Interest 0 0 0  Down, Depressed, Hopeless 0 0 1  PHQ - 2 Score 0 0 1       Assessment & Plan:   1. Annual physical exam Age appropriate guidance given. Return in 1 year for annual wellness exam, sooner as needed.  2. Breast cancer screening Mammogram scheduled next week. Encouraged monthly SBEs.  3. Screening for metabolic disorder Labs pending. - Comprehensive metabolic panel  4. Screening for diabetes mellitus Labs pending. - Hemoglobin A1c  5. Screening for lipid disorders Labs pending. - Lipid panel  6. Screening for blood or protein in urine Labs pending. - POCT urinalysis dipstick  7. Encounter for screening for HIV Labs pending.  8. Need for shingles vaccine Zoster ordered today. - Zoster Vac Recomb [facility]juvant[facility](SHINGRIX) injection; Inject 0.5 mLs into the muscle once.  Dispense: 1 each; Refill: 1  9. Fibromyalgia RF given for oxycodone, meloxicam, and amitriptyline. Encouraged regular use to optimize pain control. Prescribed TENS unit as hers was broken. Referral to home health initiated. - oxyCODONE (OXY IR/ROXICODONE) 5 MG immediate release tablet; Take 1 tablet (5 mg total) by mouth every 4 (four) hours as needed for severe pain.  Dispense: 30 tablet; Refill: 0 - meloxicam (MOBIC) 15 MG tablet; Take 1 tablet (15 mg total) by mouth daily.  Dispense: 90 tablet; Refill: 0 - amitriptyline (ELAVIL) 75 MG tablet; Take 1 tablet (75 mg total) by mouth at bedtime.  Dispense: 90 tablet; Refill: 3 - Nerve Stimulator (PRO COMFORT TENS UNIT) DEVI; 1 Device by Does not apply route daily as needed.  Dispense: 1 Device; Refill: 1 -  Ambulatory referral to Home Health  10. Benign essential HTN Labs pending. - CBC with Differential/Platelet  11. Herpes zoster without complication Stable.  12. Hypomagnesemia Incidental finding at prior encounter. Labs pending. - Magnesium  13. Cutaneous candidiasis Stable with nystatin. RF given. - nystatin (MYCOSTATIN/NYSTOP) powder; Apply topically 2 (two) times daily.  Dispense: 90 g; Refill: prn  14. Anxiety and depression Improving. RF valium.  - diazepam (VALIUM) 5 MG tablet; Take 1 tablet (5 mg total) by mouth every 8 (eight) hours as needed for anxiety.  Dispense: 90 tablet; Refill: 0  15. Nephrolithiasis Referral to nephrology stands for follow-up.  16. Chronic asthma without complication, unspecified asthma severity, unspecified whether persistent Stable. RF symbicort. -  budesonide-formoterol (SYMBICORT) 160-4.5 MCG/ACT inhaler; Inhale 2 puffs into the lungs 2 (two) times daily.  Dispense: 3 Inhaler; Refill: 3  17. Impaired physical mobility SCAT paperwork completed today. Referral to home health re-initiated. - Ambulatory referral to Home Health  18. BMI 45.0-49.9, adult [facility]) Referral to nutrition re-initiated. - Ambulatory referral to Nutrition and Diabetic Education

## 2016-12-27 NOTE — Patient Instructions (Addendum)
Please contact your new insurance carrier to find out what vaccines they cover. (Specifically check for TD and/or TDaP.)  Check the [place] bus route.  It's ok to try CBD oil.  IF you received an x-ray today, you will receive an [facility] from [facility]. Please contact [facility] at [phone] with questions or concerns regarding your invoice.   IF you received labwork today, you will receive an invoice from [facility]. Please contact [facility] at [phone] with questions or concerns regarding your invoice.   Our billing staff will not be able to assist you with questions regarding bills from these companies.  You will be contacted with the lab results as soon as they are available. The fastest way to get your results is to activate your My Chart account. Instructions are located on the last page of this paperwork. If you have not heard from us regarding the results in 2 weeks, please contact this office.

## 2016-12-27 NOTE — Progress Notes (Signed)
Patient ID: Brianna Roberts, female    DOB: 1965/12/11, 51 y.o.   MRN: [id number]  PCP: Porfirio Oar, PA-C  Chief Complaint  Patient presents with  . Annual Exam    w/ pap    Subjective:   Presents for [employer] Visit.   Cervical Cancer Screening: no longer a candidate, s/p TAH for non-cancerous reason, no history of abnormal pap test. Breast Cancer Screening: mammogram scheduled for next week Colorectal Cancer Screening: reportedly has had a colonoscopy, but no records for documentation. She has scheduled with GI for next month Bone Density Testing: not yet HIV Screening: presumably has had screening previously, but no documentation. STI Screening: very low risk, not sexually active since 2008. Seasonal Influenza Vaccination: annually Td/Tdap Vaccination: unknown date of last booster Pneumococcal Vaccination: received a dose previously, "unspecified," likely Pneumococcal 23. Next dose due at age 83. Zoster Vaccination: not yet Frequency of Dental evaluation: last visit 2015, requests referral Frequency of Eye evaluation: appointment is scheduled for next month.   CARE TEAM: Patient Care Team: Porfirio Oar, PA-C as PCP - General (Family Medicine) She has been referred to but not yet seen by a handful of specialists for her multiple medical problems. Issues with her health benefits have caused the delay.  ADVANCED DIRECTIVES: Does not have a living will or health care power of attorney, but is in the process of creating them.  HOME: Lives in an apartment. No stairs  FUNCTIONAL STATUS: Performs all her own ADLs. Uses an assistive device to ambulate due to pain. No learning or communication barriers.  FALLS: No falls in the past year.  INCONTINENCE: Burning pelvic pain, but no urinary incontinence.  Depression screen [facility] 2/9 12/27/2016 10/29/2016 10/05/2016  Decreased Interest 0 0 0  Down, Depressed, Hopeless 0 0 1  PHQ - 2 Score 0 0 1     Patient Active  Problem List   Diagnosis Date Noted  . Post concussion syndrome 10/06/2016  . 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 Fibromyalgia 10/05/2016  . Benign essential HTN 10/05/2016  . Hypothyroidism 10/05/2016  . Allergic rhinitis 10/05/2016  . Urticaria 10/05/2016  . Chronic fatigue syndrome 10/05/2016  . Asthma, chronic 10/05/2016  . HSV (herpes simplex virus) anogenital infection 10/05/2016  . Shingles 10/05/2016  . Anxiety and depression 10/05/2016  . Bilateral leg edema 10/05/2016  . Ovarian cyst 10/05/2016  . Nephrolithiasis 10/05/2016  . IBS (irritable bowel syndrome) 10/05/2016  . OSA (obstructive sleep apnea) 10/05/2016  . Left-sided weakness 10/05/2016  . Migraine 10/05/2016  . BMI 45.0-49.9, adult (HCC) 10/05/2016  . DDD (degenerative disc disease), cervical   . Insomnia   . Right lateral epicondylitis 03/29/2015    Past Medical History:  Diagnosis Date  . Allergy   . Anxiety   . Arthritis   . Asthma   . 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 Concussion   . DDD (degenerative disc disease), cervical   . Depression   . Homelessness   . Hypertension   . Insomnia   . Neuromuscular disorder (HCC)   . Thyroid disease      Prior to Admission medications   Medication Sig Start Date End Date Taking? Authorizing Provider  acyclovir (ZOVIRAX) 200 MG capsule Take 1 capsule (200 mg total) by mouth 2 (two) times daily. 10/05/16  Yes Naviyah Schaffert, PA-C  albuterol (PROVENTIL HFA;VENTOLIN HFA) 108 (90 Base) MCG/ACT inhaler Inhale 1-2 puffs into the lungs every 6 (  six) hours as needed for wheezing or shortness of breath. 10/05/16  Yes Ranvir Renovato, PA-C  amitriptyline (ELAVIL) 50 MG tablet Take 1 tablet (50 mg total) by mouth at bedtime. 10/05/16  Yes Zakiyah Diop, PA-C  ARIPiprazole (ABILIFY) 10 MG tablet Take 1 tablet (10 mg total) by mouth daily. 10/05/16  Yes Ioannis Schuh, PA-C  budesonide-formoterol  (SYMBICORT) 160-4.5 MCG/ACT inhaler Inhale 2 puffs into the lungs 2 (two) times daily.   Yes [provider]  diazepam (VALIUM) 5 MG tablet Take 1 tablet (5 mg total) by mouth every 8 (eight) hours as needed for anxiety. 10/05/16  Yes Quantrell Splitt, PA-C  fluticasone (FLONASE) 50 MCG/ACT nasal spray Place 1 spray into both nostrils daily. 10/05/16  Yes Nhi Butrum, PA-C  furosemide (LASIX) 40 MG tablet Take 1 tablet (40 mg total) by mouth 2 (two) times daily. 10/05/16  Yes Kelliann Pendergraph, PA-C  hydrOXYzine (ATARAX/VISTARIL) 10 MG tablet Take 1 tablet (10 mg total) by mouth at bedtime. 10/05/16  Yes Cassey Hurrell, PA-C  ipratropium-albuterol (DUONEB) 0.5-2.5 (3) MG/3ML SOLN Take 3 mLs by nebulization every 6 (six) hours as needed (for breathing). 10/05/16  Yes Sigurd Pugh, PA-C  levothyroxine (SYNTHROID, LEVOTHROID) 25 MCG tablet Take 1 tablet (25 mcg total) by mouth daily before breakfast. 10/05/16  Yes Nazario Russom, PA-C  losartan (COZAAR) 100 MG tablet Take 1 tablet (100 mg total) by mouth daily. 10/05/16  Yes Samaa Ueda, PA-C  meloxicam (MOBIC) 15 MG tablet Take 1 tablet (15 mg total) by mouth daily. 11/17/16  Yes Palumbo, April, MD  metoprolol (LOPRESSOR) 100 MG tablet Take 1 tablet (100 mg total) by mouth 2 (two) times daily. 10/05/16  Yes Lili Harts, PA-C  nystatin (MYCOSTATIN/NYSTOP) powder Apply topically 2 (two) times daily. 10/29/16 10/29/17 Yes Hannan Tetzlaff, PA-C  nystatin cream (MYCOSTATIN) Apply 1 application topically 2 (two) times daily. 10/29/16  Yes Damara Klunder, PA-C  oxyCODONE (OXY IR/ROXICODONE) 5 MG immediate release tablet Take 1 tablet (5 mg total) by mouth every 4 (four) hours as needed for severe pain. 10/05/16  Yes Mykenna Viele, PA-C  potassium chloride SA (K-DUR,KLOR-CON) 20 MEQ tablet Take 20 mEq by mouth 2 (two) times daily.   Yes [provider]  predniSONE (DELTASONE) 20 MG tablet Take 2 tablets (40 mg total) by mouth daily. 09/29/16   Yes Muthersbaugh, Dahlia Client, PA-C  Probiotic Product (ALIGN) 4 MG CAPS Take 4 mg by mouth daily.   Yes [provider]  sertraline (ZOLOFT) 100 MG tablet Take 1 tablet (100 mg total) by mouth daily. 10/05/16  Yes Jariana Shumard, PA-C  methocarbamol (ROBAXIN) 500 MG tablet Take 1 tablet (500 mg total) by mouth 2 (two) times daily. Patient not taking: Reported on 12/27/2016 11/17/16   Nicanor Alcon, April, MD    Allergies  Allergen Reactions  . Sulfa Antibiotics Anaphylaxis  . Codeine Other (See Comments)    Unknown   . Dilaudid [Hydromorphone Hcl] Itching  . Penicillins Other (See Comments)    Unknown   . Spironolactone Other (See Comments)    "GI Issues"    Past Surgical History:  Procedure Laterality Date  . ABDOMINAL HYSTERECTOMY     Partial-ovaries and tubes remain  . CHOLECYSTECTOMY    . HERNIA REPAIR  1997  . TUMOR EXCISION Right 1984   Sinuses  . TUMOR EXCISION Left 2015   sinuses  . URACHAL CYST EXCISION  1997    Family History  Problem Relation Age of Onset  . Cancer Mother 65  breast cancer  . Diabetes Mother   . Heart disease Mother   . Hyperlipidemia Mother   . Hypertension Mother   . Stroke Mother   . Mental illness Mother   . Heart failure Mother   . Headache Mother        Joellyn Quails Cyst  . Heart disease Father   . Alcohol abuse Father   . Hypertension Sister   . Heart disease Brother   . Mental illness Brother   . COPD Brother   . Heart failure Brother   . Hypertension Sister     Social History   Social History  . Marital status: Widowed    Spouse name: Newman Pies  . Number of children: 0  . Years of education: college+   Occupational History  . disability     fibromyalgia   Social History Main Topics  . Smoking status: Current Every Day Smoker    Packs/day: 0.30    Years: 33.00    Types: Cigarettes  . Smokeless tobacco: Never Used  . Alcohol use No  . Drug use: No  . Sexual activity: No   Other Topics Concern    . None   Social History Narrative   Moved to [place], [place] from [place], [place] 04/28/2016 in need of a change.   Her family remains in [place].   Lives alone.    Review of Systems Fibromyalgia: Currently having some muscle spasms. Had an appointment scheduled 2 days ago but was in so much pain she was unable to come. Her right shoulder has really been bothering her. She reports having "fibro-fog." She feels feels she is being undertreated. Continuing oxycodone, meloxicam and amitriptyline 75mg . She will occasionally use Tylenol. She will occasionally valium use for muscle spasms as well as anxiety. Epicondylitis: Used to see a rheumatologist for cortisone shots. Still interested in pursuing a referral now that her insurance has changed. HTN: Occasionally takes home BPs on both arms. Systolics 128-175, diastolics 79-94. Migraines: Has not had in a very long time.  Anxiety and depression: Improving.  IBSD: Not taking probiotic. Stable. Seeing GI next month. Insomnia: Would like to re-initiate referral to sleep studies for CPAP.      Objective:  Physical Exam  Constitutional: She is oriented to person, place, and time. Vital signs are normal. She appears well-developed and well-nourished. She is active and cooperative. No distress.  BP (!) 141/87   Pulse 98   Temp 98.9 F (37.2 C) (Oral)   Resp 16   Ht 5' 8.5" (1.74 m)   Wt (!) 330 lb (149.7 kg)   SpO2 96%   BMI 49.45 kg/m    HENT:  Head: Normocephalic and atraumatic.  Right Ear: Hearing, tympanic membrane, external ear and ear canal normal. No foreign bodies.  Left Ear: Hearing, tympanic membrane, external ear and ear canal normal. No foreign bodies.  Nose: Nose normal.  Mouth/Throat: Uvula is midline, oropharynx is clear and moist and mucous membranes are normal. No oral lesions. Normal dentition. No dental abscesses or uvula swelling. No oropharyngeal exudate.  Eyes: Pupils are equal, round, and reactive to light.  Conjunctivae, EOM and lids are normal. Right eye exhibits no discharge. Left eye exhibits no discharge. No scleral icterus.  Fundoscopic exam:      The right eye shows no arteriolar narrowing, no AV nicking, no exudate, no hemorrhage and no papilledema. The right eye shows red reflex.       The left eye shows no arteriolar narrowing,  no AV nicking, no exudate, no hemorrhage and no papilledema. The left eye shows red reflex.  Neck: Trachea normal, normal range of motion and full passive range of motion without pain. Neck supple. No spinous process tenderness and no muscular tenderness present. No thyroid mass and no thyromegaly present.  Cardiovascular: Normal rate, regular rhythm, normal heart sounds, intact distal pulses and normal pulses.   Pulmonary/Chest: Effort normal and breath sounds normal. Right breast exhibits no inverted nipple, no mass, no nipple discharge, no skin change and no tenderness. Left breast exhibits no inverted nipple, no mass, no nipple discharge, no skin change and no tenderness. Breasts are symmetrical.  Musculoskeletal: She exhibits no edema.       Right shoulder: She exhibits decreased range of motion, tenderness and pain.       Cervical back: Normal.       Thoracic back: Normal.       Lumbar back: Normal.  Lymphadenopathy:       Head (right side): No tonsillar, no preauricular, no posterior auricular and no occipital adenopathy present.       Head (left side): No tonsillar, no preauricular, no posterior auricular and no occipital adenopathy present.    She has no cervical adenopathy.       Right: No supraclavicular adenopathy present.       Left: No supraclavicular adenopathy present.  Neurological: She is alert and oriented to person, place, and time. She has normal strength and normal reflexes. No cranial nerve deficit. She exhibits normal muscle tone. Coordination and gait normal.  Skin: Skin is warm, dry and intact. No rash noted. She is not diaphoretic. No  cyanosis or erythema. Nails show no clubbing.  Psychiatric: She has a normal mood and affect. Her speech is normal and behavior is normal. Judgment and thought content normal.           Assessment & Plan:   Problem List Items Addressed This Visit    Fibromyalgia    Proceed with rheumatology evaluation.      Relevant Medications   oxyCODONE (OXY IR/ROXICODONE) 5 MG immediate release tablet   meloxicam (MOBIC) 15 MG tablet   amitriptyline (ELAVIL) 75 MG tablet   Nerve Stimulator (PRO COMFORT TENS UNIT) DEVI   Other Relevant Orders   Ambulatory referral to Home Health   Benign essential HTN   Relevant Orders   CBC with Differential/Platelet (Completed)   Asthma, chronic   Relevant Medications   budesonide-formoterol (SYMBICORT) 160-4.5 MCG/ACT inhaler   Shingles    Associated with urinary tract infections.      Relevant Medications   nystatin (MYCOSTATIN/NYSTOP) powder   Anxiety and depression    Proceed with psychiatry evaluation.      Relevant Medications   diazepam (VALIUM) 5 MG tablet   Nephrolithiasis    Proceed with urology evaluation.      Relevant Medications   oxyCODONE (OXY IR/ROXICODONE) 5 MG immediate release tablet   BMI 45.0-49.9, adult (HCC)    Exercise is limited, due to pain, but weight likely contributes, in turn. Once she has established with the multiple specialists, would recommend medical weight management with Dr. Dalbert GarnetBeasley.      Relevant Orders   Ambulatory referral to Nutrition and Diabetic Education   Hypomagnesemia    Update level.       Other Visit Diagnoses    Encounter for Medicare annual wellness exam    -  Primary   Age appropriate anticipatory guidance provided. Proceed with specialist visits.  Breast cancer screening       Encounter for screening mammogram for breast cancer       Relevant Orders   MM DIGITAL SCREENING BILATERAL   Screening for metabolic disorder       Relevant Orders   Comprehensive metabolic panel  (Completed)   Screening for diabetes mellitus       Relevant Orders   Hemoglobin A1c (Completed)   Screening for lipid disorders       Relevant Orders   Lipid panel (Completed)   Screening for blood or protein in urine       Relevant Orders   POCT urinalysis dipstick (Completed)   Need for shingles vaccine       Cutaneous candidiasis       Continue PRN use of anti-fungal cream and powder.   Relevant Medications   nystatin (MYCOSTATIN/NYSTOP) powder   Impaired physical mobility       Relevant Orders   Ambulatory referral to Home Health       Return in about 4 weeks (around 01/24/2017) for re-evaluation of chronic issues.   Fernande Bras, PA-C Primary Care at [facility]

## 2016-12-28 LAB — COMPREHENSIVE METABOLIC PANEL
ALBUMIN: 4.4 g/dL (ref 3.5–5.5)
ALT: 11 IU/L (ref 0–32)
AST: 14 IU/L (ref 0–40)
Albumin/Globulin Ratio: 1.6 (ref 1.2–2.2)
Alkaline Phosphatase: 88 IU/L (ref 39–117)
BILIRUBIN TOTAL: 0.2 mg/dL (ref 0.0–1.2)
BUN / CREAT RATIO: 13 (ref 9–23)
BUN: 11 mg/dL (ref 6–24)
CALCIUM: 9 mg/dL (ref 8.7–10.2)
CHLORIDE: 103 mmol/L (ref 96–106)
CO2: 21 mmol/L (ref 20–29)
Creatinine, Ser: 0.87 mg/dL (ref 0.57–1.00)
GFR calc Af Amer: 90 mL/min/{1.73_m2} (ref >59)
GFR, EST NON AFRICAN AMERICAN: 78 mL/min/{1.73_m2} (ref >59)
GLOBULIN, TOTAL: 2.7 g/dL (ref 1.5–4.5)
Glucose: 99 mg/dL (ref 65–99)
POTASSIUM: 3.9 mmol/L (ref 3.5–5.2)
SODIUM: 143 mmol/L (ref 134–144)
Total Protein: 7.1 g/dL (ref 6.0–8.5)

## 2016-12-28 LAB — CBC WITH DIFFERENTIAL/PLATELET
BASOS: 0 %
Basophils Absolute: 0.1 10*3/uL (ref 0.0–0.2)
EOS (ABSOLUTE): 0.1 10*3/uL (ref 0.0–0.4)
EOS: 1 %
HEMATOCRIT: 39.2 % (ref 34.0–46.6)
HEMOGLOBIN: 13.1 g/dL (ref 11.1–15.9)
IMMATURE GRANS (ABS): 0 10*3/uL (ref 0.0–0.1)
IMMATURE GRANULOCYTES: 0 %
LYMPHS: 32 %
Lymphocytes Absolute: 3.9 10*3/uL — ABNORMAL HIGH (ref 0.7–3.1)
MCH: 26.7 pg (ref 26.6–33.0)
MCHC: 33.4 g/dL (ref 31.5–35.7)
MCV: 80 fL (ref 79–97)
Monocytes Absolute: 0.4 10*3/uL (ref 0.1–0.9)
Monocytes: 4 %
NEUTROS ABS: 7.6 10*3/uL — AB (ref 1.4–7.0)
NEUTROS PCT: 63 %
Platelets: 361 10*3/uL (ref 150–379)
RBC: 4.9 x10E6/uL (ref 3.77–5.28)
RDW: 14.8 % (ref 12.3–15.4)
WBC: 12.2 10*3/uL — ABNORMAL HIGH (ref 3.4–10.8)

## 2016-12-28 LAB — LIPID PANEL
CHOL/HDL RATIO: 2.7 ratio (ref 0.0–4.4)
Cholesterol, Total: 143 mg/dL (ref 100–199)
HDL: 53 mg/dL (ref >39)
LDL Calculated: 70 mg/dL (ref 0–99)
Triglycerides: 101 mg/dL (ref 0–149)
VLDL Cholesterol Cal: 20 mg/dL (ref 5–40)

## 2016-12-28 LAB — HEMOGLOBIN A1C
ESTIMATED AVERAGE GLUCOSE: 123 mg/dL
HEMOGLOBIN A1C: 5.9 % — AB (ref 4.8–5.6)

## 2016-12-28 LAB — MAGNESIUM: Magnesium: 2.1 mg/dL (ref 1.6–2.3)

## 2016-12-30 ENCOUNTER — Encounter: Payer: Self-pay | Admitting: Obstetrics & Gynecology

## 2016-12-31 ENCOUNTER — Encounter: Payer: Self-pay | Admitting: Physician Assistant

## 2016-12-31 DIAGNOSIS — R7303 Prediabetes: Secondary | ICD-10-CM | POA: Insufficient documentation

## 2017-01-01 ENCOUNTER — Encounter: Payer: Self-pay | Admitting: Physician Assistant

## 2017-01-02 ENCOUNTER — Ambulatory Visit: Payer: 59 | Admitting: Obstetrics & Gynecology

## 2017-01-04 NOTE — Assessment & Plan Note (Signed)
Update level.  

## 2017-01-04 NOTE — Assessment & Plan Note (Signed)
Proceed with urology evaluation. 

## 2017-01-04 NOTE — Assessment & Plan Note (Signed)
Associated with urinary tract infections.

## 2017-01-04 NOTE — Assessment & Plan Note (Signed)
Proceed with psychiatry evaluation.

## 2017-01-04 NOTE — Assessment & Plan Note (Signed)
Proceed with rheumatology evaluation.

## 2017-01-04 NOTE — Assessment & Plan Note (Signed)
Exercise is limited, due to pain, but weight likely contributes, in turn. Once she has established with the multiple specialists, would recommend medical weight management with Dr. Dalbert GarnetBeasley.

## 2017-01-09 DIAGNOSIS — M5030 Other cervical disc degeneration, unspecified cervical region: Secondary | ICD-10-CM | POA: Diagnosis not present

## 2017-01-09 DIAGNOSIS — R69 Illness, unspecified: Secondary | ICD-10-CM | POA: Diagnosis not present

## 2017-01-09 DIAGNOSIS — K589 Irritable bowel syndrome without diarrhea: Secondary | ICD-10-CM | POA: Diagnosis not present

## 2017-01-09 DIAGNOSIS — M797 Fibromyalgia: Secondary | ICD-10-CM | POA: Diagnosis not present

## 2017-01-09 DIAGNOSIS — I10 Essential (primary) hypertension: Secondary | ICD-10-CM | POA: Diagnosis not present

## 2017-01-11 ENCOUNTER — Telehealth: Payer: Self-pay | Admitting: Family Medicine

## 2017-01-11 NOTE — Telephone Encounter (Signed)
Mr. Brianna Roberts called to inform us that pt missed visit today  Req. To adjust visit orders to 1wx1v, 2x2, 1x2

## 2017-01-11 NOTE — Telephone Encounter (Signed)
THIS MESSAGE IS FROM S. SREEJESH (P.T.) FROM [facility] FOR DR. Katrinka BlazingSMITH: (THIS MESSAGE WAS ROUTED TO DR. Katrinka BlazingSMITH BECAUSE MR. SREEJESH SAID THE ORDER HAS TO BE SIGNED BY AN MD. - CHELLE IS HER PCP). VERBAL ORDERS ARE NEEDED FOR PHYSICAL THERAPY 2 TIMES A WEEK FOR 2 WEEKS. AFTER THAT 1 TIME A WEEK FOR 3 WEEKS. HER BLOOD PRESSURE WAS 156/96 AND HE WANTED TO SPEAK TO A NURSE. JILL TALKED WITH HIM. BEST PHONE [phone] (MR. SREEJESH - PHYSICAL THERAPIST) MBC

## 2017-01-15 ENCOUNTER — Encounter: Payer: Self-pay | Admitting: Physician Assistant

## 2017-01-16 DIAGNOSIS — R69 Illness, unspecified: Secondary | ICD-10-CM | POA: Diagnosis not present

## 2017-01-16 DIAGNOSIS — K589 Irritable bowel syndrome without diarrhea: Secondary | ICD-10-CM | POA: Diagnosis not present

## 2017-01-16 DIAGNOSIS — I10 Essential (primary) hypertension: Secondary | ICD-10-CM | POA: Diagnosis not present

## 2017-01-16 DIAGNOSIS — M797 Fibromyalgia: Secondary | ICD-10-CM | POA: Diagnosis not present

## 2017-01-16 DIAGNOSIS — M5030 Other cervical disc degeneration, unspecified cervical region: Secondary | ICD-10-CM | POA: Diagnosis not present

## 2017-01-16 NOTE — Telephone Encounter (Signed)
Please advise 

## 2017-01-17 NOTE — Telephone Encounter (Signed)
Please move forward with PSYCHIATRY referral. Patient has changed insurance since referral placed in April. Doesn't have to be anywhere is particular. Neuropsychiatry would be ideal.

## 2017-01-18 DIAGNOSIS — K589 Irritable bowel syndrome without diarrhea: Secondary | ICD-10-CM | POA: Diagnosis not present

## 2017-01-18 DIAGNOSIS — M5030 Other cervical disc degeneration, unspecified cervical region: Secondary | ICD-10-CM | POA: Diagnosis not present

## 2017-01-18 DIAGNOSIS — R69 Illness, unspecified: Secondary | ICD-10-CM | POA: Diagnosis not present

## 2017-01-18 DIAGNOSIS — M797 Fibromyalgia: Secondary | ICD-10-CM | POA: Diagnosis not present

## 2017-01-18 DIAGNOSIS — I10 Essential (primary) hypertension: Secondary | ICD-10-CM | POA: Diagnosis not present

## 2017-01-19 DIAGNOSIS — M5030 Other cervical disc degeneration, unspecified cervical region: Secondary | ICD-10-CM | POA: Diagnosis not present

## 2017-01-19 DIAGNOSIS — M797 Fibromyalgia: Secondary | ICD-10-CM | POA: Diagnosis not present

## 2017-01-19 DIAGNOSIS — R69 Illness, unspecified: Secondary | ICD-10-CM | POA: Diagnosis not present

## 2017-01-19 DIAGNOSIS — K589 Irritable bowel syndrome without diarrhea: Secondary | ICD-10-CM | POA: Diagnosis not present

## 2017-01-19 DIAGNOSIS — I10 Essential (primary) hypertension: Secondary | ICD-10-CM | POA: Diagnosis not present

## 2017-01-19 NOTE — Telephone Encounter (Signed)
Spoke with Rodena GoldmannSreejesh of physical therapy of [facility]; provided verbal approval for recommended therapy.

## 2017-01-22 ENCOUNTER — Encounter: Payer: Self-pay | Admitting: Physician Assistant

## 2017-01-22 ENCOUNTER — Ambulatory Visit (INDEPENDENT_AMBULATORY_CARE_PROVIDER_SITE_OTHER): Payer: Medicare HMO | Admitting: Physician Assistant

## 2017-01-22 VITALS — BP 131/88 | HR 86 | Temp 98.1°F | Resp 18 | Ht 68.5 in | Wt 332.0 lb

## 2017-01-22 DIAGNOSIS — F419 Anxiety disorder, unspecified: Secondary | ICD-10-CM

## 2017-01-22 DIAGNOSIS — J45909 Unspecified asthma, uncomplicated: Secondary | ICD-10-CM

## 2017-01-22 DIAGNOSIS — G4733 Obstructive sleep apnea (adult) (pediatric): Secondary | ICD-10-CM | POA: Diagnosis not present

## 2017-01-22 DIAGNOSIS — R69 Illness, unspecified: Secondary | ICD-10-CM | POA: Diagnosis not present

## 2017-01-22 DIAGNOSIS — I10 Essential (primary) hypertension: Secondary | ICD-10-CM

## 2017-01-22 DIAGNOSIS — M5030 Other cervical disc degeneration, unspecified cervical region: Secondary | ICD-10-CM | POA: Diagnosis not present

## 2017-01-22 DIAGNOSIS — M25511 Pain in right shoulder: Secondary | ICD-10-CM | POA: Diagnosis not present

## 2017-01-22 DIAGNOSIS — Z6841 Body Mass Index (BMI) 40.0 and over, adult: Secondary | ICD-10-CM

## 2017-01-22 DIAGNOSIS — F32A Depression, unspecified: Secondary | ICD-10-CM

## 2017-01-22 DIAGNOSIS — R7303 Prediabetes: Secondary | ICD-10-CM | POA: Diagnosis not present

## 2017-01-22 DIAGNOSIS — K589 Irritable bowel syndrome without diarrhea: Secondary | ICD-10-CM | POA: Diagnosis not present

## 2017-01-22 DIAGNOSIS — F329 Major depressive disorder, single episode, unspecified: Secondary | ICD-10-CM

## 2017-01-22 DIAGNOSIS — M797 Fibromyalgia: Secondary | ICD-10-CM | POA: Diagnosis not present

## 2017-01-22 NOTE — Patient Instructions (Addendum)
We recommend that you schedule a mammogram for breast cancer screening. Typically, you do not need a referral to do this. Please contact a local imaging center to schedule your mammogram.  [facility] - [phone]  *ask for the Radiology Department [facility] - [phone] or [phone]) [phone]  [facility] - [phone]) [phone] [facility] - [phone]) [phone] [facility] - [phone]  *ask for the Radiology Department [facility] - [phone]) [phone]  *ask for the Radiology Department [facility] - [phone]  *ask for the Mammography Department [facility] - [phone]    IF you received an x-ray today, you will receive an invoice from [facility]. Please contact [facility] at [phone] with questions or concerns regarding your invoice.   IF you received labwork today, you will receive an invoice from [facility]. Please contact [facility] at [phone] with questions or concerns regarding your invoice.   Our billing staff will not be able to assist you with questions regarding bills from these companies.  You will be contacted with the lab results as soon as they are available. The fastest way to get your results is to activate your My Chart account. Instructions are located on the last page of this paperwork. If you have not heard from us regarding the results in 2 weeks, please contact this office.    Definitely use the nebs if you are coughing, wheezing.  The [facility] is where I referred you. You can call them directly to schedule.  HAIR: Brianna Roberts at [employer].

## 2017-01-22 NOTE — Progress Notes (Signed)
Chief Complaint  Patient presents with  . Chronic Issues    Depression scale score 19, pain management, medication issues   HPI: Brianna Roberts is a 51 year old female who comes into the office today for a follow up of her chronic issues. Obtaining a history today difficult due to the the multiple complaints and scattered thoughts of the patient.  Pain Management She has recently started physical therapy for her right shoulder. The PT takes her BP, and it is consisently elevated, with today's reading at 184/102. Her PT was concerned and wanted to send her to the hospital, but she denied. She normally takes her medications at noon, and the readings he is taking are without her blood pressure medications, and she believes that is what is causing the elevated readings. She has agreed to start taking her blood pressure medications at 9am, so they will be in full effect when her PT arrives for a session. Her PT also has a nurse that accompanies him to her visits.  She feels that her right shoulder has gotten worse since her last visit. She has tried ice, heat, and her TUNS unit. She feels her fibromyalgia pain medication is not taking down the pain enough. She feels it is swollen and is painful to the touch. She would like to be referred to a rheumatologist, for they have previous given her steroid injections for it, or is open to another specialist who can help her.  The patient also reports her right hand feels weak when writing and driving, and that this pain could be related to her lateral epicondylitis.  Smoking Cessation Activity trying to quit smoking by not buying cigarettes. Joined epic program, but they dont start up till November.  Depression Brought to patient's attention her depression screening results today were vastly different from previous screening. When the truthfulness of her past screening were questioned, she was offended and said that today "she has been dragged into the deep end  this time" and would like to manage her depression before it gets worse.   Review of Systems  Constitutional: Negative for fever.  Respiratory: Positive for cough and wheezing (every night). Negative for sputum production.   Cardiovascular: Negative for chest pain.  Gastrointestinal: Positive for diarrhea (attributest to IBS). Negative for nausea and vomiting.  Musculoskeletal: Positive for joint pain and myalgias.  Psychiatric/Behavioral: Positive for depression. Negative for suicidal ideas.    Patient Active Problem List   Diagnosis Date Noted  . Prediabetes 12/31/2016  . Hypomagnesemia 12/27/2016  . Post concussion syndrome 10/06/2016  . 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 Fibromyalgia 10/05/2016  . Benign essential HTN 10/05/2016  . Hypothyroidism 10/05/2016  . Allergic rhinitis 10/05/2016  . Urticaria 10/05/2016  . Chronic fatigue syndrome 10/05/2016  . Asthma, chronic 10/05/2016  . HSV (herpes simplex virus) anogenital infection 10/05/2016  . Shingles 10/05/2016  . Anxiety and depression 10/05/2016  . Bilateral leg edema 10/05/2016  . Ovarian cyst 10/05/2016  . Nephrolithiasis 10/05/2016  . IBS (irritable bowel syndrome) 10/05/2016  . OSA (obstructive sleep apnea) 10/05/2016  . Left-sided weakness 10/05/2016  . Migraine 10/05/2016  . BMI 45.0-49.9, adult (HCC) 10/05/2016  . DDD (degenerative disc disease), cervical   . Insomnia   . Right lateral epicondylitis 03/29/2015   Prior to Admission medications   Medication Sig Start Date End Date Taking? Authorizing Provider  acyclovir (ZOVIRAX) 200 MG capsule Take 1 capsule (200 mg total) by mouth 2 (two) times daily.  10/05/16   Porfirio Oar, PA-C  albuterol (PROVENTIL HFA;VENTOLIN HFA) 108 (90 Base) MCG/ACT inhaler Inhale 1-2 puffs into the lungs every 6 (six) hours as needed for wheezing or shortness of breath. 10/05/16   Porfirio Oar, PA-C  amitriptyline (ELAVIL) 75 MG tablet Take 1  tablet (75 mg total) by mouth at bedtime. 12/27/16   Porfirio Oar, PA-C  amLODipine (NORVASC) 2.5 MG tablet  12/28/16   [provider]  ARIPiprazole (ABILIFY) 10 MG tablet Take 1 tablet (10 mg total) by mouth daily. 10/05/16   Porfirio Oar, PA-C  budesonide-formoterol (SYMBICORT) 160-4.5 MCG/ACT inhaler Inhale 2 puffs into the lungs 2 (two) times daily. 12/27/16   Jeffery, Chelle, PA-C  diazepam (VALIUM) 5 MG tablet Take 1 tablet (5 mg total) by mouth every 8 (eight) hours as needed for anxiety. 12/27/16   Jeffery, Chelle, PA-C  fluticasone (FLONASE) 50 MCG/ACT nasal spray Place 1 spray into both nostrils daily. 10/05/16   Porfirio Oar, PA-C  furosemide (LASIX) 40 MG tablet Take 1 tablet (40 mg total) by mouth 2 (two) times daily. 10/05/16   Porfirio Oar, PA-C  hydrOXYzine (ATARAX/VISTARIL) 10 MG tablet Take 1 tablet (10 mg total) by mouth at bedtime. 10/05/16   Jeffery, Chelle, PA-C  ipratropium-albuterol (DUONEB) 0.5-2.5 (3) MG/3ML SOLN Take 3 mLs by nebulization every 6 (six) hours as needed (for breathing). 10/05/16   Porfirio Oar, PA-C  levothyroxine (SYNTHROID, LEVOTHROID) 25 MCG tablet Take 1 tablet (25 mcg total) by mouth daily before breakfast. 10/05/16   Porfirio Oar, PA-C  losartan (COZAAR) 100 MG tablet Take 1 tablet (100 mg total) by mouth daily. 10/05/16   Porfirio Oar, PA-C  meloxicam (MOBIC) 15 MG tablet Take 1 tablet (15 mg total) by mouth daily. 12/27/16   Porfirio Oar, PA-C  metoprolol (LOPRESSOR) 100 MG tablet Take 1 tablet (100 mg total) by mouth 2 (two) times daily. 10/05/16   Porfirio Oar, PA-C  Nerve Stimulator (PRO COMFORT TENS UNIT) DEVI 1 Device by Does not apply route daily as needed. 12/27/16   Porfirio Oar, PA-C  nystatin (MYCOSTATIN/NYSTOP) powder Apply topically 2 (two) times daily. 12/27/16 12/27/17  Porfirio Oar, PA-C  oxyCODONE (OXY IR/ROXICODONE) 5 MG immediate release tablet Take 1 tablet (5 mg total) by mouth every 4 (four) hours as needed for  severe pain. 12/27/16   Porfirio Oar, PA-C  potassium chloride SA (K-DUR,KLOR-CON) 20 MEQ tablet Take 20 mEq by mouth 2 (two) times daily.    [provider]  Probiotic Product (ALIGN) 4 MG CAPS Take 4 mg by mouth daily.    [provider]  sertraline (ZOLOFT) 100 MG tablet Take 1 tablet (100 mg total) by mouth daily. 10/05/16   Porfirio Oar, PA-C   Allergies  Allergen Reactions  . Sulfa Antibiotics Anaphylaxis  . Codeine Other (See Comments)    Unknown   . Dilaudid [Hydromorphone Hcl] Itching  . Other     Other reaction(s): Info Not Available  . Penicillins Other (See Comments)    Unknown   . Spironolactone Other (See Comments)    "GI Issues"     Depression screen [facility] 2/9 01/22/2017 12/27/2016 10/29/2016 10/05/2016  Decreased Interest 1 0 0 0  Down, Depressed, Hopeless 1 0 0 1  PHQ - 2 Score 2 0 0 1  Altered sleeping 3 - - -  Tired, decreased energy 3 - - -  Change in appetite 3 - - -  Feeling bad or failure about yourself  3 - - -  Trouble  concentrating 3 - - -  Moving slowly or fidgety/restless 2 - - -  Suicidal thoughts 0 - - -  PHQ-9 Score 19 - - -  Difficult doing work/chores Very difficult - - -    Vital Signs BP 131/88 (BP Location: Right Arm, Patient Position: Sitting, Cuff Size: Large)   Pulse 86   Temp 98.1 F (36.7 C) (Oral)   Resp 18   Ht 5' 8.5" (1.74 m)   Wt (!) 332 lb (150.6 kg)   SpO2 95%   BMI 49.75 kg/m    Physical Exam  Constitutional: She appears well-developed. She appears distressed.  Cardiovascular: Normal rate, regular rhythm and normal heart sounds.   Pulses:      Radial pulses are 2+ on the right side, and 2+ on the left side.  Pulmonary/Chest: Effort normal and breath sounds normal.  Musculoskeletal:  Left wrist full active ROM. Right wrist limited active range of motion. Left wrist flexion and extension 5/5. Right wrist flexion and extension 4/5. Left fingers extension, flexion, abduction, adduction full range of  motion with 5/5 strength. Right fingers reduced active range of motion with extension, flexion, abduction, adduction with 4/5 strength.  Patient's efforts were questionable.   Lymphadenopathy:    She has no cervical adenopathy.       Right: No supraclavicular adenopathy present.       Left: No supraclavicular adenopathy present.  Psychiatric: She is agitated. She exhibits a depressed mood.  Patient became emotional when discussing her recent feeling of depression.   Assessment and Plan 1. Benign essential HTN  2. Pain in joint of right shoulder - Ambulatory referral to Rheumatology  3. Anxiety and depression  4. Chronic asthma without complication, unspecified asthma severity, unspecified whether persistent Use nebulizer treatments for coughing and wheezing  5. BMI 45.0-49.9, adult (HCC) Continue to try and make healthy diet choices [facility] to make a sleep study appointment - Amb Ref to Medical Weight Management  6. Prediabetes  Brianna Yochim "Mia" Sutton Hirsch, PA-Student [facility] [employer] Phys[facility] 01/22/17 6:51 PM

## 2017-01-22 NOTE — Progress Notes (Signed)
Patient ID: Brianna Roberts, female    DOB: October 08, 1965, 51 y.o.   MRN: [id number]  PCP: Brianna Oar, PA-C  Chief Complaint  Patient presents with  . Chronic Issues    Depression scale score 19, pain management, medication issues    Subjective:   Presents for evaluation of multiple issues.  Her situation is complicated by multiple issues, recent change in insurance coverage, previous coverage that did not include coverage of local specialty care, limited finances and no local social or family support.  She has started home PT for her RIGHT shoulder pain. Due to elevated BP at a recent session, she now also receives nursing visits. She has also identified that she has been taking her meds at 12 noon and 12 midnight, and her PT sessions are at 11 am. She plans to change her doses to 9a and 9 pm, and expects to have better readings at her sessions going forward. The shoulder pain is worsening, despite the PT. She has been referred to rheumatology, but so far has been declined due to her diagnosis of fibromyalgia.  Depression is her other primary concern at this point. She has been referred to psychiatry, but appointment has not yet been scheduled due to insurance coverage. It appears that she can be seen at [facility], and she has just been notified of that today. Prior to today's visit she has related depression, but PHQ-2 has been 0. Today is dramatically different and she reports feeling "draggedinto the deep end." She has no intention or plan for self-harm.  Depression screen [facility] 2/9 01/22/2017 12/27/2016 10/29/2016 10/05/2016  Decreased Interest 1 0 0 0  Down, Depressed, Hopeless 1 0 0 1  PHQ - 2 Score 2 0 0 1  Altered sleeping 3 - - -  Tired, decreased energy 3 - - -  Change in appetite 3 - - -  Feeling bad or failure about yourself  3 - - -  Trouble concentrating 3 - - -  Moving slowly or fidgety/restless 2 - - -  Suicidal thoughts 0 - - -  PHQ-9 Score 19 - - -    Difficult doing work/chores Very difficult - - -     Review of Systems Constitutional: Negative for fever.  Respiratory: Positive for cough and wheezing (every night). Negative for sputum production.   Cardiovascular: Negative for chest pain.  Gastrointestinal: Positive for diarrhea (attributest to IBS). Negative for nausea and vomiting.  Musculoskeletal: Positive for joint pain and myalgias.  Psychiatric/Behavioral: Positive for depression. Negative for suicidal ideas.     Patient Active Problem List   Diagnosis Date Noted  . Prediabetes 12/31/2016  . Hypomagnesemia 12/27/2016  . Post concussion syndrome 10/06/2016  . 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 Fibromyalgia 10/05/2016  . Benign essential HTN 10/05/2016  . Hypothyroidism 10/05/2016  . Allergic rhinitis 10/05/2016  . Urticaria 10/05/2016  . Chronic fatigue syndrome 10/05/2016  . Asthma, chronic 10/05/2016  . HSV (herpes simplex virus) anogenital infection 10/05/2016  . Shingles 10/05/2016  . Anxiety and depression 10/05/2016  . Bilateral leg edema 10/05/2016  . Ovarian cyst 10/05/2016  . Nephrolithiasis 10/05/2016  . IBS (irritable bowel syndrome) 10/05/2016  . OSA (obstructive sleep apnea) 10/05/2016  . Left-sided weakness 10/05/2016  . Migraine 10/05/2016  . BMI 45.0-49.9, adult (HCC) 10/05/2016  . DDD (degenerative disc disease), cervical   . Insomnia   . Right lateral epicondylitis 03/29/2015     Prior to Admission  medications   Medication Sig Start Date End Date Taking? Authorizing Provider  acyclovir (ZOVIRAX) 200 MG capsule Take 1 capsule (200 mg total) by mouth 2 (two) times daily. 10/05/16   Brianna Oar, PA-C  albuterol (PROVENTIL HFA;VENTOLIN HFA) 108 (90 Base) MCG/ACT inhaler Inhale 1-2 puffs into the lungs every 6 (six) hours as needed for wheezing or shortness of breath. 10/05/16   Brianna Oar, PA-C  amitriptyline (ELAVIL) 75 MG tablet Take 1 tablet (75 mg  total) by mouth at bedtime. 12/27/16   Brianna Oar, PA-C  amLODipine (NORVASC) 2.5 MG tablet  12/28/16   [provider]  ARIPiprazole (ABILIFY) 10 MG tablet Take 1 tablet (10 mg total) by mouth daily. 10/05/16   Brianna Oar, PA-C  budesonide-formoterol (SYMBICORT) 160-4.5 MCG/ACT inhaler Inhale 2 puffs into the lungs 2 (two) times daily. 12/27/16   Brianna Aldous, PA-C  diazepam (VALIUM) 5 MG tablet Take 1 tablet (5 mg total) by mouth every 8 (eight) hours as needed for anxiety. 12/27/16   Brianna Buell, PA-C  fluticasone (FLONASE) 50 MCG/ACT nasal spray Place 1 spray into both nostrils daily. 10/05/16   Brianna Oar, PA-C  furosemide (LASIX) 40 MG tablet Take 1 tablet (40 mg total) by mouth 2 (two) times daily. 10/05/16   Brianna Oar, PA-C  hydrOXYzine (ATARAX/VISTARIL) 10 MG tablet Take 1 tablet (10 mg total) by mouth at bedtime. 10/05/16   Brianna Cosma, PA-C  ipratropium-albuterol (DUONEB) 0.5-2.5 (3) MG/3ML SOLN Take 3 mLs by nebulization every 6 (six) hours as needed (for breathing). 10/05/16   Brianna Oar, PA-C  levothyroxine (SYNTHROID, LEVOTHROID) 25 MCG tablet Take 1 tablet (25 mcg total) by mouth daily before breakfast. 10/05/16   Brianna Oar, PA-C  losartan (COZAAR) 100 MG tablet Take 1 tablet (100 mg total) by mouth daily. 10/05/16   Brianna Oar, PA-C  meloxicam (MOBIC) 15 MG tablet Take 1 tablet (15 mg total) by mouth daily. 12/27/16   Brianna Oar, PA-C  metoprolol (LOPRESSOR) 100 MG tablet Take 1 tablet (100 mg total) by mouth 2 (two) times daily. 10/05/16   Brianna Oar, PA-C  Nerve Stimulator (PRO COMFORT TENS UNIT) DEVI 1 Device by Does not apply route daily as needed. 12/27/16   Brianna Oar, PA-C  nystatin (MYCOSTATIN/NYSTOP) powder Apply topically 2 (two) times daily. 12/27/16 12/27/17  Brianna Oar, PA-C  oxyCODONE (OXY IR/ROXICODONE) 5 MG immediate release tablet Take 1 tablet (5 mg total) by mouth every 4 (four) hours as needed for severe pain.  12/27/16   Brianna Oar, PA-C  potassium chloride SA (K-DUR,KLOR-CON) 20 MEQ tablet Take 20 mEq by mouth 2 (two) times daily.    [provider]  predniSONE (DELTASONE) 20 MG tablet Take 2 tablets (40 mg total) by mouth daily. 09/29/16   Muthersbaugh, Dahlia Client, PA-C  Probiotic Product (ALIGN) 4 MG CAPS Take 4 mg by mouth daily.    [provider]  sertraline (ZOLOFT) 100 MG tablet Take 1 tablet (100 mg total) by mouth daily. 10/05/16   Brianna Oar, PA-C     Allergies  Allergen Reactions  . Sulfa Antibiotics Anaphylaxis  . Codeine Other (See Comments)    Unknown   . Dilaudid [Hydromorphone Hcl] Itching  . Other     Other reaction(s): Info Not Available  . Penicillins Other (See Comments)    Unknown   . Spironolactone Other (See Comments)    "GI Issues"       Objective:  Physical Exam  Constitutional: She is oriented to person, place, and time.  She appears well-developed and well-nourished. She is active and cooperative. No distress.  BP 131/88 (BP Location: Right Arm, Patient Position: Sitting, Cuff Size: Large)   Pulse 86   Temp 98.1 F (36.7 C) (Oral)   Resp 18   Ht 5' 8.5" (1.74 m)   Wt (!) 332 lb (150.6 kg)   SpO2 95%   BMI 49.75 kg/m   HENT:  Head: Normocephalic and atraumatic.  Right Ear: Hearing normal.  Left Ear: Hearing normal.  Eyes: Conjunctivae are normal. No scleral icterus.  Neck: Normal range of motion. Neck supple. No thyromegaly present.  Cardiovascular: Normal rate, regular rhythm and normal heart sounds.   Pulses:      Radial pulses are 2+ on the right side, and 2+ on the left side.  Pulmonary/Chest: Effort normal and breath sounds normal.  Musculoskeletal:       Right shoulder: She exhibits decreased range of motion, tenderness and pain. She exhibits no bony tenderness, no swelling, no effusion, no crepitus, no deformity, no laceration, no spasm, normal pulse and normal strength.  Strength testing of the upper extremities notable  for slightly decreased strength on the RIGHT relative to the LEFT, though effort is questionable, possibly due to pain.  Lymphadenopathy:       Head (right side): No tonsillar, no preauricular, no posterior auricular and no occipital adenopathy present.       Head (left side): No tonsillar, no preauricular, no posterior auricular and no occipital adenopathy present.    She has no cervical adenopathy.       Right: No supraclavicular adenopathy present.       Left: No supraclavicular adenopathy present.  Neurological: She is alert and oriented to person, place, and time. No sensory deficit.  Skin: Skin is warm, dry and intact. No rash noted. No cyanosis or erythema. Nails show no clubbing.  Psychiatric: She has a normal mood and affect. Her speech is normal and behavior is normal.       Wt Readings from Last 3 Encounters:  01/22/17 (!) 332 lb (150.6 kg)  12/27/16 (!) 330 lb (149.7 kg)  10/05/16 (!) 328 lb (148.8 kg)       Assessment & Plan:   Problem List Items Addressed This Visit    Benign essential HTN - Primary    Controlled. Continue current treatment.      Relevant Medications   amLODipine (NORVASC) 2.5 MG tablet   Asthma, chronic    She has a nebulizer at home and is encouraged to use it as needed. She reports that she has the albuterol for it.      Anxiety and depression    She will call [facility] to schedule.      OSA (obstructive sleep apnea)    She has CPAP, but question fit and settings. [facility] to [facility]. She needs to call and schedule.      BMI 45.0-49.9, adult (HCC)    Referred to medical weight management.      Relevant Orders   Amb Ref to Medical Weight Management   Prediabetes    Encouraged healthy eating, especially as she is unable to get much exercise at this time.       Other Visit Diagnoses    Pain in joint of right shoulder       Refer to rheumatology. If this pain is deteremined not to be rheumatologic in nature,  will refer to orthopedics.   Relevant Orders   Ambulatory referral to  Rheumatology       No Follow-up on file.   Fernande Brashelle S. Antolin Belsito, PA-C Primary Care at [facility]

## 2017-01-24 DIAGNOSIS — R69 Illness, unspecified: Secondary | ICD-10-CM | POA: Diagnosis not present

## 2017-01-24 DIAGNOSIS — I10 Essential (primary) hypertension: Secondary | ICD-10-CM | POA: Diagnosis not present

## 2017-01-24 DIAGNOSIS — K589 Irritable bowel syndrome without diarrhea: Secondary | ICD-10-CM | POA: Diagnosis not present

## 2017-01-24 DIAGNOSIS — M5030 Other cervical disc degeneration, unspecified cervical region: Secondary | ICD-10-CM | POA: Diagnosis not present

## 2017-01-24 DIAGNOSIS — M797 Fibromyalgia: Secondary | ICD-10-CM | POA: Diagnosis not present

## 2017-01-24 NOTE — Assessment & Plan Note (Signed)
Referred to medical weight management 

## 2017-01-24 NOTE — Assessment & Plan Note (Signed)
She has a nebulizer at home and is encouraged to use it as needed. She reports that she has the albuterol for it.

## 2017-01-24 NOTE — Assessment & Plan Note (Signed)
She will call [facility] to schedule.

## 2017-01-24 NOTE — Assessment & Plan Note (Signed)
Encouraged healthy eating, especially as she is unable to get much exercise at this time.

## 2017-01-24 NOTE — Assessment & Plan Note (Signed)
She has CPAP, but question fit and settings. Referred to [facility]. She needs to call and schedule.

## 2017-01-24 NOTE — Assessment & Plan Note (Signed)
Controlled. Continue current treatment. 

## 2017-01-25 DIAGNOSIS — R69 Illness, unspecified: Secondary | ICD-10-CM | POA: Diagnosis not present

## 2017-01-25 DIAGNOSIS — K589 Irritable bowel syndrome without diarrhea: Secondary | ICD-10-CM | POA: Diagnosis not present

## 2017-01-25 DIAGNOSIS — M797 Fibromyalgia: Secondary | ICD-10-CM | POA: Diagnosis not present

## 2017-01-25 DIAGNOSIS — I10 Essential (primary) hypertension: Secondary | ICD-10-CM | POA: Diagnosis not present

## 2017-01-25 DIAGNOSIS — M5030 Other cervical disc degeneration, unspecified cervical region: Secondary | ICD-10-CM | POA: Diagnosis not present

## 2017-01-28 ENCOUNTER — Encounter: Payer: Self-pay | Admitting: Neurology

## 2017-01-28 DIAGNOSIS — Z0000 Encounter for general adult medical examination without abnormal findings: Secondary | ICD-10-CM | POA: Diagnosis not present

## 2017-01-28 DIAGNOSIS — M5030 Other cervical disc degeneration, unspecified cervical region: Secondary | ICD-10-CM | POA: Diagnosis not present

## 2017-01-28 DIAGNOSIS — R69 Illness, unspecified: Secondary | ICD-10-CM | POA: Diagnosis not present

## 2017-01-28 DIAGNOSIS — M797 Fibromyalgia: Secondary | ICD-10-CM | POA: Diagnosis not present

## 2017-01-28 DIAGNOSIS — G4733 Obstructive sleep apnea (adult) (pediatric): Secondary | ICD-10-CM | POA: Diagnosis not present

## 2017-01-28 DIAGNOSIS — G894 Chronic pain syndrome: Secondary | ICD-10-CM | POA: Diagnosis not present

## 2017-01-28 DIAGNOSIS — R7303 Prediabetes: Secondary | ICD-10-CM | POA: Diagnosis not present

## 2017-01-28 DIAGNOSIS — M6281 Muscle weakness (generalized): Secondary | ICD-10-CM | POA: Diagnosis not present

## 2017-01-28 DIAGNOSIS — K589 Irritable bowel syndrome without diarrhea: Secondary | ICD-10-CM | POA: Diagnosis not present

## 2017-01-28 DIAGNOSIS — I10 Essential (primary) hypertension: Secondary | ICD-10-CM | POA: Diagnosis not present

## 2017-01-30 DIAGNOSIS — I10 Essential (primary) hypertension: Secondary | ICD-10-CM | POA: Diagnosis not present

## 2017-01-30 DIAGNOSIS — G894 Chronic pain syndrome: Secondary | ICD-10-CM | POA: Diagnosis not present

## 2017-01-31 DIAGNOSIS — M5030 Other cervical disc degeneration, unspecified cervical region: Secondary | ICD-10-CM | POA: Diagnosis not present

## 2017-01-31 DIAGNOSIS — R69 Illness, unspecified: Secondary | ICD-10-CM | POA: Diagnosis not present

## 2017-01-31 DIAGNOSIS — K589 Irritable bowel syndrome without diarrhea: Secondary | ICD-10-CM | POA: Diagnosis not present

## 2017-01-31 DIAGNOSIS — I10 Essential (primary) hypertension: Secondary | ICD-10-CM | POA: Diagnosis not present

## 2017-01-31 DIAGNOSIS — M797 Fibromyalgia: Secondary | ICD-10-CM | POA: Diagnosis not present

## 2017-02-01 ENCOUNTER — Emergency Department (HOSPITAL_COMMUNITY): Payer: Medicare HMO

## 2017-02-01 ENCOUNTER — Ambulatory Visit: Payer: Medicare (Managed Care) | Admitting: Gastroenterology

## 2017-02-01 ENCOUNTER — Encounter (HOSPITAL_COMMUNITY): Payer: Self-pay

## 2017-02-01 ENCOUNTER — Emergency Department (HOSPITAL_COMMUNITY)
Admission: EM | Admit: 2017-02-01 | Discharge: 2017-02-01 | Disposition: A | Discharge status: Home / Self Care | Payer: Medicare HMO | Attending: Emergency Medicine | Admitting: Emergency Medicine

## 2017-02-01 DIAGNOSIS — Z79899 Other long term (current) drug therapy: Secondary | ICD-10-CM | POA: Diagnosis not present

## 2017-02-01 DIAGNOSIS — M5030 Other cervical disc degeneration, unspecified cervical region: Secondary | ICD-10-CM | POA: Diagnosis not present

## 2017-02-01 DIAGNOSIS — J45909 Unspecified asthma, uncomplicated: Secondary | ICD-10-CM | POA: Diagnosis not present

## 2017-02-01 DIAGNOSIS — R1031 Right lower quadrant pain: Secondary | ICD-10-CM | POA: Insufficient documentation

## 2017-02-01 DIAGNOSIS — M797 Fibromyalgia: Secondary | ICD-10-CM | POA: Diagnosis not present

## 2017-02-01 DIAGNOSIS — I10 Essential (primary) hypertension: Secondary | ICD-10-CM | POA: Diagnosis not present

## 2017-02-01 DIAGNOSIS — F17210 Nicotine dependence, cigarettes, uncomplicated: Secondary | ICD-10-CM | POA: Insufficient documentation

## 2017-02-01 DIAGNOSIS — R1011 Right upper quadrant pain: Secondary | ICD-10-CM | POA: Diagnosis not present

## 2017-02-01 DIAGNOSIS — K589 Irritable bowel syndrome without diarrhea: Secondary | ICD-10-CM | POA: Diagnosis not present

## 2017-02-01 DIAGNOSIS — R69 Illness, unspecified: Secondary | ICD-10-CM | POA: Diagnosis not present

## 2017-02-01 DIAGNOSIS — R109 Unspecified abdominal pain: Secondary | ICD-10-CM

## 2017-02-01 LAB — COMPREHENSIVE METABOLIC PANEL
ALBUMIN: 3.9 g/dL (ref 3.5–5.0)
ALK PHOS: 66 U/L (ref 38–126)
ALT: 17 U/L (ref 14–54)
ANION GAP: 7 (ref 5–15)
AST: 17 U/L (ref 15–41)
BILIRUBIN TOTAL: 0.9 mg/dL (ref 0.3–1.2)
BUN: 8 mg/dL (ref 6–20)
CALCIUM: 8.6 mg/dL — AB (ref 8.9–10.3)
CO2: 29 mmol/L (ref 22–32)
Chloride: 105 mmol/L (ref 101–111)
Creatinine, Ser: 0.93 mg/dL (ref 0.44–1.00)
GFR calc Af Amer: >60 mL/min (ref >60)
GFR calc non Af Amer: >60 mL/min (ref >60)
GLUCOSE: 93 mg/dL (ref 65–99)
Potassium: 3.7 mmol/L (ref 3.5–5.1)
SODIUM: 141 mmol/L (ref 135–145)
TOTAL PROTEIN: 7 g/dL (ref 6.5–8.1)

## 2017-02-01 LAB — URINALYSIS, ROUTINE W REFLEX MICROSCOPIC
Glucose, UA: NEGATIVE mg/dL
HGB URINE DIPSTICK: NEGATIVE
Ketones, ur: NEGATIVE mg/dL
LEUKOCYTES UA: NEGATIVE
NITRITE: NEGATIVE
PROTEIN: 30 mg/dL — AB
Specific Gravity, Urine: 1.03 (ref 1.005–1.030)
pH: 6 (ref 5.0–8.0)

## 2017-02-01 LAB — CBC
HCT: 40 % (ref 36.0–46.0)
HEMOGLOBIN: 12.9 g/dL (ref 12.0–15.0)
MCH: 26.6 pg (ref 26.0–34.0)
MCHC: 32.3 g/dL (ref 30.0–36.0)
MCV: 82.5 fL (ref 78.0–100.0)
Platelets: 347 10*3/uL (ref 150–400)
RBC: 4.85 MIL/uL (ref 3.87–5.11)
RDW: 14.1 % (ref 11.5–15.5)
WBC: 11.1 10*3/uL — ABNORMAL HIGH (ref 4.0–10.5)

## 2017-02-01 LAB — LIPASE, BLOOD: Lipase: 19 U/L (ref 11–51)

## 2017-02-01 MED ORDER — IBUPROFEN 600 MG PO TABS: 600.0000 mg | ORAL_TABLET | Freq: Four times a day (QID) | ORAL | 0 refills | Status: DC | PRN

## 2017-02-01 MED ORDER — ONDANSETRON HCL 4 MG PO TABS: 4.0000 mg | ORAL_TABLET | Freq: Three times a day (TID) | ORAL | 0 refills | Status: DC | PRN

## 2017-02-01 MED ORDER — DIPHENHYDRAMINE HCL 50 MG/ML IJ SOLN
25.0000 mg | Freq: Once | INTRAMUSCULAR | Status: AC
  Administered 2017-02-01: 25 mg via INTRAVENOUS
  Filled 2017-02-01: qty 1

## 2017-02-01 MED ORDER — METHYLPREDNISOLONE SODIUM SUCC 125 MG IJ SOLR
125.0000 mg | Freq: Once | INTRAMUSCULAR | Status: AC
  Administered 2017-02-01: 125 mg via INTRAVENOUS
  Filled 2017-02-01: qty 2

## 2017-02-01 MED ORDER — METHOCARBAMOL 500 MG PO TABS: 1000.0000 mg | ORAL_TABLET | Freq: Three times a day (TID) | ORAL | 0 refills | Status: DC | PRN

## 2017-02-01 MED ORDER — PREDNISONE 20 MG PO TABS: ORAL_TABLET | ORAL | 0 refills | Status: DC

## 2017-02-01 MED ORDER — PREDNISONE 20 MG PO TABS
ORAL_TABLET | ORAL | 0 refills | Status: DC
Start: 2017-02-01 — End: 2017-02-09

## 2017-02-01 MED ORDER — ONDANSETRON HCL 4 MG/2ML IJ SOLN
4.0000 mg | Freq: Once | INTRAMUSCULAR | Status: AC
  Administered 2017-02-01: 4 mg via INTRAVENOUS
  Filled 2017-02-01: qty 2

## 2017-02-01 MED ORDER — IOPAMIDOL (ISOVUE-300) INJECTION 61%
INTRAVENOUS | Status: AC
  Administered 2017-02-01: 100 mL via INTRAVENOUS
  Filled 2017-02-01: qty 100

## 2017-02-01 MED ORDER — MORPHINE SULFATE (PF) 4 MG/ML IV SOLN
4.0000 mg | Freq: Once | INTRAVENOUS | Status: AC
  Administered 2017-02-01: 4 mg via INTRAVENOUS
  Filled 2017-02-01: qty 1

## 2017-02-01 NOTE — ED Notes (Signed)
To CT

## 2017-02-01 NOTE — ED Notes (Signed)
Assisted patient to bathroom to obtain UA via [facility]-patient states unable to ambulate on own due to pain in right shoulder and right lower extremity

## 2017-02-01 NOTE — ED Notes (Signed)
Ultrasound at bedside

## 2017-02-01 NOTE — ED Notes (Signed)
Patient awake and alert in no distress-pain decreased

## 2017-02-01 NOTE — ED Triage Notes (Signed)
Pt with rt lower abdominal pain to flank x few days.  No n/v.  Pt states hx of kidney stones.  Some difficulty urinating.  No fever.

## 2017-02-01 NOTE — ED Notes (Signed)
Patient requesting pain medication for her flank pain and right shoulder and right lower leg pain/Dr. Ranae PalmsYelverton made aware of patient request

## 2017-02-01 NOTE — ED Notes (Signed)
Patient returned from CT and states she is having throat discomfort, swollen uvula and itchy throat after CT contrast-Dr. Ranae PalmsYelverton made aware and in to evaluate patient. Patient speaking in full sentences with no muffling-VSS-administered meds as ordered.

## 2017-02-01 NOTE — ED Notes (Signed)
Call lab to check on urine progress, states getting ready to run urine

## 2017-02-01 NOTE — ED Notes (Signed)
Patient states pain level 8-9/10/states her normal pain level is 7/10 on a daily basis. States throat feels "a little" swollen"-speaking in clear sentences with no muffling, able to swallow with no difficulty and no wheezing, stridor or respiratory distress.

## 2017-02-01 NOTE — ED Provider Notes (Signed)
[facility]-EMERGENCY DEPT Provider Note   CSN: [id number] Arrival date & time: 02/01/17  1338     History   Chief Complaint Chief Complaint  Patient presents with  . Flank Pain  . Abdominal Pain    HPI Michon Kaczmarek is a 51 y.o. female.  HPI Patient presents with recurrent right flank pain. States it's worse in the last few days. Pain radiates to her right upper quadrant. Not associated with nausea or vomiting. Is not exacerbated by food. States she has been taking oxycodone with little relief. She's having some difficulty urinating. Denies any gross hematuria. Will see in emergency department several times last few months for similar complaints. Past Medical History:  Diagnosis Date  . Allergy   . Anxiety   . Arthritis   . Asthma   . 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 Concussion   . DDD (degenerative disc disease), cervical   . Depression   . Homelessness   . Hypertension   . Insomnia   . Neuromuscular disorder (HCC)   . Thyroid disease     Patient Active Problem List   Diagnosis Date Noted  . Prediabetes 12/31/2016  . Hypomagnesemia 12/27/2016  . Post concussion syndrome 10/06/2016  . 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 Fibromyalgia 10/05/2016  . Benign essential HTN 10/05/2016  . Hypothyroidism 10/05/2016  . Allergic rhinitis 10/05/2016  . Urticaria 10/05/2016  . Chronic fatigue syndrome 10/05/2016  . Asthma, chronic 10/05/2016  . HSV (herpes simplex virus) anogenital infection 10/05/2016  . Shingles 10/05/2016  . Anxiety and depression 10/05/2016  . Bilateral leg edema 10/05/2016  . Ovarian cyst 10/05/2016  . Nephrolithiasis 10/05/2016  . IBS (irritable bowel syndrome) 10/05/2016  . OSA (obstructive sleep apnea) 10/05/2016  . Left-sided weakness 10/05/2016  . Migraine 10/05/2016  . BMI 45.0-49.9, adult (HCC) 10/05/2016  . DDD (degenerative disc disease), cervical   . Insomnia   . Right  lateral epicondylitis 03/29/2015    Past Surgical History:  Procedure Laterality Date  . ABDOMINAL HYSTERECTOMY     Partial-ovaries and tubes remain  . CHOLECYSTECTOMY    . HERNIA REPAIR  1997  . TUMOR EXCISION Right 1984   Sinuses  . TUMOR EXCISION Left 2015   sinuses  . URACHAL CYST EXCISION  1997    OB History    No data available       Home Medications    Prior to Admission medications   Medication Sig Start Date End Date Taking? Authorizing Provider  acetaminophen (TYLENOL) 500 MG tablet Take 500-1,000 mg by mouth 2 (two) times daily as needed for mild pain.   Yes [provider]  acyclovir (ZOVIRAX) 200 MG capsule Take 1 capsule (200 mg total) by mouth 2 (two) times daily. 10/05/16  Yes Jeffery, Chelle, PA-C  albuterol (PROVENTIL HFA;VENTOLIN HFA) 108 (90 Base) MCG/ACT inhaler Inhale 1-2 puffs into the lungs every 6 (six) hours as needed for wheezing or shortness of breath. Patient taking differently: Inhale 2 puffs into the lungs every 6 (six) hours as needed for wheezing or shortness of breath.  10/05/16  Yes Jeffery, Chelle, PA-C  amitriptyline (ELAVIL) 75 MG tablet Take 1 tablet (75 mg total) by mouth at bedtime. 12/27/16  Yes Jeffery, Chelle, PA-C  amLODipine (NORVASC) 2.5 MG tablet Take 2.5 mg by mouth daily.  12/28/16  Yes [provider]  ARIPiprazole (ABILIFY) 10 MG tablet Take 1 tablet (10 mg total) by mouth daily. 10/05/16  Yes Jeffery, Chelle, PA-C  budesonide-formoterol (SYMBICORT) 160-4.5 MCG/ACT inhaler Inhale 2 puffs into the lungs 2 (two) times daily. 12/27/16  Yes Jeffery, Chelle, PA-C  diazepam (VALIUM) 5 MG tablet Take 1 tablet (5 mg total) by mouth every 8 (eight) hours as needed for anxiety. 12/27/16  Yes Jeffery, Chelle, PA-C  diphenhydrAMINE (BENADRYL) 25 mg capsule Take 25 mg by mouth 2 (two) times daily.   Yes [provider]  fluticasone (FLONASE) 50 MCG/ACT nasal spray Place 1 spray into both nostrils daily. Patient taking  differently: Place 2 sprays into both nostrils daily.  10/05/16  Yes Jeffery, Chelle, PA-C  furosemide (LASIX) 40 MG tablet Take 1 tablet (40 mg total) by mouth 2 (two) times daily. 10/05/16  Yes Jeffery, Chelle, PA-C  hydrOXYzine (ATARAX/VISTARIL) 10 MG tablet Take 1 tablet (10 mg total) by mouth at bedtime. Patient taking differently: Take 10 mg by mouth at bedtime as needed for itching (angioedema).  10/05/16  Yes Jeffery, Chelle, PA-C  ipratropium-albuterol (DUONEB) 0.5-2.5 (3) MG/3ML SOLN Take 3 mLs by nebulization every 6 (six) hours as needed (for breathing). 10/05/16  Yes Jeffery, Chelle, PA-C  levothyroxine (SYNTHROID, LEVOTHROID) 25 MCG tablet Take 1 tablet (25 mcg total) by mouth daily before breakfast. Patient taking differently: Take 25 mcg by mouth daily.  10/05/16  Yes Jeffery, Chelle, PA-C  losartan (COZAAR) 100 MG tablet Take 1 tablet (100 mg total) by mouth daily. 10/05/16  Yes Jeffery, Chelle, PA-C  meloxicam (MOBIC) 15 MG tablet Take 1 tablet (15 mg total) by mouth daily. 12/27/16  Yes Jeffery, Chelle, PA-C  metoprolol (LOPRESSOR) 100 MG tablet Take 1 tablet (100 mg total) by mouth 2 (two) times daily. 10/05/16  Yes Jeffery, Chelle, PA-C  nystatin (MYCOSTATIN/NYSTOP) powder Apply topically 2 (two) times daily. Patient taking differently: Apply 8 g topically 2 (two) times daily.  12/27/16 12/27/17 Yes Jeffery, Chelle, PA-C  nystatin cream (MYCOSTATIN) Apply 1 application topically 2 (two) times daily.   Yes [provider]  oxyCODONE (OXY IR/ROXICODONE) 5 MG immediate release tablet Take 1 tablet (5 mg total) by mouth every 4 (four) hours as needed for severe pain. 12/27/16  Yes Jeffery, Chelle, PA-C  potassium chloride SA (K-DUR,KLOR-CON) 20 MEQ tablet Take 20 mEq by mouth 2 (two) times daily.   Yes [provider]  Probiotic Product (ALIGN) 4 MG CAPS Take 4 mg by mouth daily.   Yes [provider]  sertraline (ZOLOFT) 100 MG tablet Take 1 tablet (100 mg total) by  mouth daily. 10/05/16  Yes Jeffery, Chelle, PA-C  trolamine salicylate (ASPERCREME) 10 % cream Apply 1 application topically at bedtime as needed for muscle pain.   Yes [provider]  ibuprofen (ADVIL,MOTRIN) 600 MG tablet Take 1 tablet (600 mg total) by mouth every 6 (six) hours as needed. 02/01/17   Loren Racer, MD  methocarbamol (ROBAXIN) 500 MG tablet Take 2 tablets (1,000 mg total) by mouth every 8 (eight) hours as needed for muscle spasms. 02/01/17   Loren Racer, MD  Nerve Stimulator (PRO COMFORT TENS UNIT) DEVI 1 Device by Does not apply route daily as needed. 12/27/16   Porfirio Oar, PA-C  ondansetron (ZOFRAN) 4 MG tablet Take 1 tablet (4 mg total) by mouth every 8 (eight) hours as needed for nausea or vomiting. 02/01/17   Loren Racer, MD  predniSONE (DELTASONE) 20 MG tablet 3 tabs po day one, then 2 po daily x 4 days 02/01/17   Loren Racer, MD    Family History Family  History  Problem Relation Age of Onset  . Cancer Mother 66       breast cancer  . Diabetes Mother   . Heart disease Mother   . Hyperlipidemia Mother   . Hypertension Mother   . Stroke Mother   . Mental illness Mother   . Heart failure Mother   . Headache Mother        Joellyn Quails Cyst  . Heart disease Father   . Alcohol abuse Father   . Hypertension Sister   . Heart disease Brother   . Mental illness Brother   . COPD Brother   . Heart failure Brother   . Hypertension Sister     Social History Social History  Substance Use Topics  . Smoking status: Current Every Day Smoker    Packs/day: 0.30    Years: 33.00    Types: Cigarettes  . Smokeless tobacco: Never Used  . Alcohol use No     Allergies   Sulfa antibiotics; Iodinated diagnostic agents; Codeine; Dilaudid [hydromorphone hcl]; Other; Penicillins; and Spironolactone   Review of Systems Review of Systems  Constitutional: Positive for fatigue. Negative for chills and fever.  Eyes: Negative for photophobia and visual  disturbance.  Respiratory: Negative for cough and shortness of breath.   Cardiovascular: Negative for chest pain.  Gastrointestinal: Positive for abdominal pain. Negative for constipation, diarrhea, nausea and vomiting.  Genitourinary: Positive for difficulty urinating and flank pain. Negative for dysuria, frequency, hematuria, vaginal bleeding and vaginal discharge.  Musculoskeletal: Positive for back pain and myalgias. Negative for neck pain and neck stiffness.  Skin: Negative for rash and wound.  Neurological: Negative for dizziness, weakness, light-headedness, numbness and headaches.  All other systems reviewed and are negative.    Physical Exam Updated Vital Signs BP 126/90   Pulse 73   Temp 98.3 F (36.8 C) (Oral)   Resp 16   Ht 5\' 9"  (1.753 m)   Wt (!) 150.8 kg (332 lb 6 oz)   SpO2 93%   BMI 49.08 kg/m   Physical Exam  Constitutional: She is oriented to person, place, and time. She appears well-developed and well-nourished. No distress.  HENT:  Head: Normocephalic and atraumatic.  Mouth/Throat: Oropharynx is clear and moist. No oropharyngeal exudate.  Eyes: Pupils are equal, round, and reactive to light. EOM are normal.  Neck: Normal range of motion. Neck supple.  Cardiovascular: Normal rate and regular rhythm.  Exam reveals no gallop and no friction rub.   No murmur heard. Pulmonary/Chest: Effort normal and breath sounds normal. No respiratory distress. She has no wheezes. She has no rales.  Abdominal: Soft. Bowel sounds are normal. There is tenderness. There is no rebound and no guarding.  Patient has tenderness to palpation in the right upper quadrant. There is no rebound or guarding.  Musculoskeletal: Normal range of motion. She exhibits tenderness. She exhibits no edema.  Diffuse right-sided lumbar paraspinal muscular tenderness. Questionable right CVA tenderness. No midline thoracic or lumbar tenderness. No lower extremity swelling, asymmetry or tenderness. Distal  pulses are 2+.  Neurological: She is alert and oriented to person, place, and time.  5/5 motor in all extremity. Sensation fully intact.  Skin: Skin is warm and dry. Capillary refill takes less than 2 seconds. No rash noted. She is not diaphoretic. No erythema.  Psychiatric: She has a normal mood and affect. Her behavior is normal.  Nursing note and vitals reviewed.    ED Treatments / Results  Labs (all labs ordered are listed,  but only abnormal results are displayed) Labs Reviewed  URINE CULTURE - Abnormal; Notable for the following:       Result Value   Culture MULTIPLE SPECIES PRESENT, SUGGEST RECOLLECTION (*)    All other components within normal limits  COMPREHENSIVE METABOLIC PANEL - Abnormal; Notable for the following:    Calcium 8.6 (*)    All other components within normal limits  CBC - Abnormal; Notable for the following:    WBC 11.1 (*)    All other components within normal limits  URINALYSIS, ROUTINE W REFLEX MICROSCOPIC - Abnormal; Notable for the following:    Color, Urine AMBER (*)    APPearance HAZY (*)    Bilirubin Urine SMALL (*)    Protein, ur 30 (*)    Bacteria, UA MANY (*)    Squamous Epithelial / LPF 0-5 (*)    All other components within normal limits  LIPASE, BLOOD    EKG  EKG Interpretation None       Radiology Ct Abdomen Pelvis W Contrast  Result Date: 02/01/2017 CLINICAL DATA:  Acute onset right lower quadrant abdominal pain and flank pain. Difficulty urinating. Initial encounter. EXAM: CT ABDOMEN AND PELVIS WITH CONTRAST TECHNIQUE: Multidetector CT imaging of the abdomen and pelvis was performed using the standard protocol following bolus administration of intravenous contrast. CONTRAST:  100 mL<See Chart> ISOVUE-300 IOPAMIDOL (ISOVUE-300) INJECTION 61% COMPARISON:  CT of the abdomen and pelvis from 11/17/2016, and renal ultrasound performed earlier today at 7:56 p.m. FINDINGS: Lower chest: The visualized lung bases are grossly clear. The  visualized portions of the mediastinum are unremarkable. Hepatobiliary: The liver is unremarkable in appearance. The patient is status post cholecystectomy, with clips noted at the gallbladder fossa. The common bile duct remains normal in caliber. Pancreas: The pancreas is within normal limits. Spleen: The spleen is unremarkable in appearance. Adrenals/Urinary Tract: The adrenal glands are unremarkable in appearance. The kidneys are within normal limits. There is no evidence of hydronephrosis. No renal or ureteral stones are identified. No perinephric stranding is seen. Stomach/Bowel: The stomach is unremarkable in appearance. The small bowel is within normal limits. The appendix is borderline prominent but otherwise within normal limits. There is no evidence for appendicitis. The colon is unremarkable in appearance. Vascular/Lymphatic: The abdominal aorta is unremarkable in appearance. The inferior vena cava is grossly unremarkable. No retroperitoneal lymphadenopathy is seen. No pelvic sidewall lymphadenopathy is identified. Reproductive: The bladder is relatively decompressed and not well assessed. The patient is status post hysterectomy. A 3.3 cm left adnexal cystic focus is likely physiologic. Other: No additional soft tissue abnormalities are seen. Musculoskeletal: No acute osseous abnormalities are identified. The visualized musculature is unremarkable in appearance. IMPRESSION: 1. No acute abnormality seen to explain the patient's symptoms. 2. 3.3 cm left adnexal cystic focus is likely physiologic, though pelvic ultrasound could be considered for further evaluation, as deemed clinically appropriate. Electronically Signed   By: Roanna RaiderJeffery  Chang M.D.   On: 02/01/2017 21:33   [place] Renal  Result Date: 02/01/2017 CLINICAL DATA:  Right flank pain. History of nephrolithiasis. Morbid obesity. EXAM: RENAL / URINARY TRACT ULTRASOUND COMPLETE COMPARISON:  Abdomen and pelvis CT dated 11/17/2016. FINDINGS: Right Kidney:  Length: 10.6 cm. Normal echotexture. No mass or hydronephrosis. No visible calculi. Left Kidney: Length: 11.0 cm. Normal echotexture. Prominent dromedary hump or poorly defined mass in the midportion of the kidney. This appeared represent normal cortex on the previous noncontrast CT, with no visible mass without contrast. No hydronephrosis or visible calculi. Bladder:  Appears normal for degree of bladder distention. IMPRESSION: 1. No acute abnormality. 2. Probable left renal dromedary hump. A poorly defined mass in the midportion of the left kidney is less likely. A mass could be excluded with an abdomen CT with contrast. Electronically Signed   By: Beckie Salts M.D.   On: 02/01/2017 20:12    Procedures Procedures (including critical care time)  Medications Ordered in ED Medications  morphine 4 MG/ML injection 4 mg (4 mg Intravenous Given 02/01/17 2113)  ondansetron (ZOFRAN) injection 4 mg (4 mg Intravenous Given 02/01/17 2112)  diphenhydrAMINE (BENADRYL) injection 25 mg (25 mg Intravenous Given 02/01/17 2112)  iopamidol (ISOVUE-300) 61 % injection (100 mLs Intravenous Contrast Given 02/01/17 2055)  methylPREDNISolone sodium succinate (SOLU-MEDROL) 125 mg/2 mL injection 125 mg (125 mg Intravenous Given 02/01/17 2123)     Initial Impression / Assessment and Plan / ED Course  I have reviewed the triage vital signs and the nursing notes.  Pertinent labs & imaging results that were available during my care of the patient were reviewed by me and considered in my medical decision making (see chart for details).     No acute abnormality seen on renal ultrasound and CT abdomen and pelvis. Patient did have some mild throat swelling sensation after IV contrast was given. Given Solu-Medrol and Benadryl with complete resolution of her symptoms. She is advised to follow up closely with her primary physician and gastroenterologist and nephrologist as previously arranged. Return precautions have been  given. Final Clinical Impressions(s) / ED Diagnoses   Final diagnoses:  Right flank pain  Right upper quadrant abdominal pain    New Prescriptions Discharge Medication List as of 02/01/2017 10:57 PM       Loren Racer, MD 02/03/17 1423

## 2017-02-03 LAB — URINE CULTURE

## 2017-02-06 DIAGNOSIS — M797 Fibromyalgia: Secondary | ICD-10-CM | POA: Diagnosis not present

## 2017-02-06 DIAGNOSIS — K589 Irritable bowel syndrome without diarrhea: Secondary | ICD-10-CM | POA: Diagnosis not present

## 2017-02-06 DIAGNOSIS — M5030 Other cervical disc degeneration, unspecified cervical region: Secondary | ICD-10-CM | POA: Diagnosis not present

## 2017-02-06 DIAGNOSIS — R69 Illness, unspecified: Secondary | ICD-10-CM | POA: Diagnosis not present

## 2017-02-06 DIAGNOSIS — I10 Essential (primary) hypertension: Secondary | ICD-10-CM | POA: Diagnosis not present

## 2017-02-07 ENCOUNTER — Ambulatory Visit: Payer: Medicare HMO | Admitting: Family Medicine

## 2017-02-09 ENCOUNTER — Encounter: Payer: Self-pay | Admitting: Physician Assistant

## 2017-02-09 ENCOUNTER — Ambulatory Visit (INDEPENDENT_AMBULATORY_CARE_PROVIDER_SITE_OTHER): Payer: Medicare HMO | Admitting: Physician Assistant

## 2017-02-09 VITALS — BP 133/84 | HR 87 | Temp 98.1°F | Resp 18 | Ht 69.0 in | Wt 338.0 lb

## 2017-02-09 DIAGNOSIS — Z6841 Body Mass Index (BMI) 40.0 and over, adult: Secondary | ICD-10-CM

## 2017-02-09 DIAGNOSIS — M5030 Other cervical disc degeneration, unspecified cervical region: Secondary | ICD-10-CM

## 2017-02-09 DIAGNOSIS — F419 Anxiety disorder, unspecified: Secondary | ICD-10-CM

## 2017-02-09 DIAGNOSIS — R69 Illness, unspecified: Secondary | ICD-10-CM | POA: Diagnosis not present

## 2017-02-09 DIAGNOSIS — G4733 Obstructive sleep apnea (adult) (pediatric): Secondary | ICD-10-CM | POA: Diagnosis not present

## 2017-02-09 DIAGNOSIS — M797 Fibromyalgia: Secondary | ICD-10-CM

## 2017-02-09 DIAGNOSIS — R5382 Chronic fatigue, unspecified: Secondary | ICD-10-CM

## 2017-02-09 DIAGNOSIS — K580 Irritable bowel syndrome with diarrhea: Secondary | ICD-10-CM

## 2017-02-09 DIAGNOSIS — R39198 Other difficulties with micturition: Secondary | ICD-10-CM | POA: Diagnosis not present

## 2017-02-09 DIAGNOSIS — I10 Essential (primary) hypertension: Secondary | ICD-10-CM

## 2017-02-09 DIAGNOSIS — G9332 Myalgic encephalomyelitis/chronic fatigue syndrome: Secondary | ICD-10-CM

## 2017-02-09 DIAGNOSIS — F329 Major depressive disorder, single episode, unspecified: Secondary | ICD-10-CM | POA: Diagnosis not present

## 2017-02-09 DIAGNOSIS — F32A Depression, unspecified: Secondary | ICD-10-CM

## 2017-02-09 DIAGNOSIS — R109 Unspecified abdominal pain: Secondary | ICD-10-CM

## 2017-02-09 LAB — POCT URINALYSIS DIP (MANUAL ENTRY)
BILIRUBIN UA: NEGATIVE
BILIRUBIN UA: NEGATIVE mg/dL
GLUCOSE UA: NEGATIVE mg/dL
LEUKOCYTES UA: NEGATIVE
Nitrite, UA: NEGATIVE
Protein Ur, POC: NEGATIVE mg/dL
Spec Grav, UA: >=1.03 — AB (ref 1.010–1.025)
Urobilinogen, UA: 0.2 E.U./dL
pH, UA: 5.5 (ref 5.0–8.0)

## 2017-02-09 LAB — POC MICROSCOPIC URINALYSIS (UMFC)

## 2017-02-09 MED ORDER — TRAMADOL HCL 50 MG PO TABS: 50.0000 mg | ORAL_TABLET | Freq: Three times a day (TID) | ORAL | 0 refills | Status: DC | PRN

## 2017-02-09 MED ORDER — POTASSIUM CHLORIDE CRYS ER 20 MEQ PO TBCR: 20.0000 meq | EXTENDED_RELEASE_TABLET | Freq: Two times a day (BID) | ORAL | 0 refills | Status: DC

## 2017-02-09 NOTE — Progress Notes (Signed)
Patient ID: Brianna Roberts, female    DOB: 1965-10-19, 51 y.o.   MRN: [id number]  PCP: Porfirio Oar, PA-C  Chief Complaint  Patient presents with  . Hospitalization Follow-up    Right Flank Pain. Difficulty Urinating. Progressively Worsened Last few Months. Seen in ED 02/01/17.  . Depression    See screening.  . Diarrhea    Started today    Subjective:   Presents for hospital follow-up and evaluation of diarrhea. Due to multiple issues, it is somewhat difficult to gather a clear history.  Nurse came to the house and recommended that she go to the ED. Thought she may have had a kidney stone. It was the worst she's had in 5 years. Any movement increases the pain. Still unable to completely empty her bladder.  Her BP went up. Has done this before. Goes up for months at a time, randomly, 200's/90's. Pain is worse, unable to do her PT. "I'm nocturnal. The PT comes during the day and wakes me up. Of course my pressure was up." Was told at her last visit that he wouldn't be returning. She doesn't know if it is because of her BP, lack of improvement, or something else.  "I feel awful, but in another way." Exhausted. Different from chronic fatigue. Different from depression. Nausea this morning. Diarrhea has recurred in the past month, x 1 here in the office. Not dizzy, but not steady, "My head just doesn't feel right." Just wants to stay in bed, but has too much to do. "My cognitive skills are awful." Started a load of laundry, and then forgot about it until the next day. Put clothes in the drier and then forgot to start it. "This is worse than Fibro-fog." It's frightening to her. "I don't know what's wrong with me. I think it's more than one thing." "It's not a good time of year for me. It's not a good time of year for my family." "We have had back to back to back losses." Was scheduled with the [facility], in October. Completed the paperwork and made a down  payment, but then was told that they didn't take her insurance.. I have talked with Pollyann Savoy. Safety contracted with her. She is out of the office this week, and they will schedule upon her return.  Feels a knot under the RIGHT ribs. "Sometimes it just flares up." "There is something going on in this hip and my leg that is not fibro." BURSITIS previously in this hip. 03/2012 had flare like this.  Has "gained 6 pounds since I saw you last." "I didn't fill my prescriptions." "They gave me more prednisone." "I can't keep gaining weight." Did get enrolled in [employer]. Has a card for the [facility]. Has rehabbed and lost weight with pool exercise previously. Loves the pool. Helps her mood, too. Was told that Medicaid doesn't pay for nutrition visits, which she chose over medical weight management at our last visit. TENS unit-hasn't been able to get a new one, even with the prescription I provided.  At her first visit with me, in April, I made multiple referrals. Still pending: Sees neurology, Dr. Allena Katz, in November. Allergy-wants to hold off Eye-not currently a need GI-hasn't scheduled yet GYN-hasn't scheduled yet.    Review of Systems As above. No CP, SOB, dizziness. No nausea or vomiting. No melena, hematochezia.    Patient Active Problem List   Diagnosis Date Noted  . Prediabetes 12/31/2016  . Hypomagnesemia 12/27/2016  .  Post concussion syndrome 10/06/2016  . 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 Fibromyalgia 10/05/2016  . Benign essential HTN 10/05/2016  . Hypothyroidism 10/05/2016  . Allergic rhinitis 10/05/2016  . Urticaria 10/05/2016  . Chronic fatigue syndrome 10/05/2016  . Asthma, chronic 10/05/2016  . HSV (herpes simplex virus) anogenital infection 10/05/2016  . Shingles 10/05/2016  . Anxiety and depression 10/05/2016  . Bilateral leg edema 10/05/2016  . Ovarian cyst 10/05/2016  . Nephrolithiasis 10/05/2016  . IBS (irritable  bowel syndrome) 10/05/2016  . OSA (obstructive sleep apnea) 10/05/2016  . Left-sided weakness 10/05/2016  . Migraine 10/05/2016  . BMI 45.0-49.9, adult (HCC) 10/05/2016  . DDD (degenerative disc disease), cervical   . Insomnia   . Right lateral epicondylitis 03/29/2015     Prior to Admission medications   Medication Sig Start Date End Date Taking? Authorizing Provider  acetaminophen (TYLENOL) 500 MG tablet Take 500-1,000 mg by mouth 2 (two) times daily as needed for mild pain.   Yes [provider]  acyclovir (ZOVIRAX) 200 MG capsule Take 1 capsule (200 mg total) by mouth 2 (two) times daily. 10/05/16  Yes Jaree Trinka, PA-C  albuterol (PROVENTIL HFA;VENTOLIN HFA) 108 (90 Base) MCG/ACT inhaler Inhale 1-2 puffs into the lungs every 6 (six) hours as needed for wheezing or shortness of breath. Patient taking differently: Inhale 2 puffs into the lungs every 6 (six) hours as needed for wheezing or shortness of breath.  10/05/16  Yes Sheralee Qazi, PA-C  amitriptyline (ELAVIL) 75 MG tablet Take 1 tablet (75 mg total) by mouth at bedtime. 12/27/16  Yes Lashay Osborne, PA-C  amLODipine (NORVASC) 2.5 MG tablet Take 2.5 mg by mouth daily.  12/28/16  Yes [provider]  ARIPiprazole (ABILIFY) 10 MG tablet Take 1 tablet (10 mg total) by mouth daily. 10/05/16  Yes Jeret Goyer, PA-C  budesonide-formoterol (SYMBICORT) 160-4.5 MCG/ACT inhaler Inhale 2 puffs into the lungs 2 (two) times daily. 12/27/16  Yes Yumna Ebers, PA-C  diazepam (VALIUM) 5 MG tablet Take 1 tablet (5 mg total) by mouth every 8 (eight) hours as needed for anxiety. 12/27/16  Yes Leif Loflin, PA-C  diphenhydrAMINE (BENADRYL) 25 mg capsule Take 25 mg by mouth 2 (two) times daily.   Yes [provider]  fluticasone (FLONASE) 50 MCG/ACT nasal spray Place 1 spray into both nostrils daily. Patient taking differently: Place 2 sprays into both nostrils daily.  10/05/16  Yes Kinsie Belford, PA-C  furosemide  (LASIX) 40 MG tablet Take 1 tablet (40 mg total) by mouth 2 (two) times daily. 10/05/16  Yes Ahjanae Cassel, PA-C  hydrOXYzine (ATARAX/VISTARIL) 10 MG tablet Take 1 tablet (10 mg total) by mouth at bedtime. Patient taking differently: Take 10 mg by mouth at bedtime as needed for itching (angioedema).  10/05/16  Yes Kirsten Spearing, PA-C  ibuprofen (ADVIL,MOTRIN) 600 MG tablet Take 1 tablet (600 mg total) by mouth every 6 (six) hours as needed. 02/01/17  Yes Loren Racer, MD  ipratropium-albuterol (DUONEB) 0.5-2.5 (3) MG/3ML SOLN Take 3 mLs by nebulization every 6 (six) hours as needed (for breathing). 10/05/16  Yes Jaamal Farooqui, PA-C  levothyroxine (SYNTHROID, LEVOTHROID) 25 MCG tablet Take 1 tablet (25 mcg total) by mouth daily before breakfast. Patient taking differently: Take 25 mcg by mouth daily.  10/05/16  Yes Rowan Pollman, PA-C  losartan (COZAAR) 100 MG tablet Take 1 tablet (100 mg total) by mouth daily. 10/05/16  Yes Cahterine Heinzel, PA-C  meloxicam (MOBIC) 15 MG tablet Take 1 tablet (  15 mg total) by mouth daily. 12/27/16  Yes Makani Seckman, PA-C  methocarbamol (ROBAXIN) 500 MG tablet Take 2 tablets (1,000 mg total) by mouth every 8 (eight) hours as needed for muscle spasms. 02/01/17  Yes Loren Racer, MD  metoprolol (LOPRESSOR) 100 MG tablet Take 1 tablet (100 mg total) by mouth 2 (two) times daily. 10/05/16  Yes Porfirio Oar, PA-C  Nerve Stimulator (PRO COMFORT TENS UNIT) DEVI 1 Device by Does not apply route daily as needed. 12/27/16  Yes Tremel Setters, PA-C  nystatin (MYCOSTATIN/NYSTOP) powder Apply topically 2 (two) times daily. Patient taking differently: Apply 8 g topically 2 (two) times daily.  12/27/16 12/27/17 Yes Remmy Crass, PA-C  nystatin cream (MYCOSTATIN) Apply 1 application topically 2 (two) times daily.   Yes [provider]  oxyCODONE (OXY IR/ROXICODONE) 5 MG immediate release tablet Take 1 tablet (5 mg total) by mouth every 4 (four) hours as needed for  severe pain. 12/27/16  Yes Daric Koren, PA-C  potassium chloride SA (K-DUR,KLOR-CON) 20 MEQ tablet Take 20 mEq by mouth 2 (two) times daily.   Yes [provider]  Probiotic Product (ALIGN) 4 MG CAPS Take 4 mg by mouth daily.   Yes [provider]  sertraline (ZOLOFT) 100 MG tablet Take 1 tablet (100 mg total) by mouth daily. 10/05/16  Yes Angelita Harnack, PA-C  triamcinolone ointment (KENALOG) 0.5 % Apply 1 application topically 2 (two) times daily.   Yes [provider]  trolamine salicylate (ASPERCREME) 10 % cream Apply 1 application topically at bedtime as needed for muscle pain.   Yes [provider]  ondansetron (ZOFRAN) 4 MG tablet Take 1 tablet (4 mg total) by mouth every 8 (eight) hours as needed for nausea or vomiting. Patient not taking: Reported on 02/09/2017 02/01/17   Loren Racer, MD  predniSONE (DELTASONE) 20 MG tablet 3 tabs po day one, then 2 po daily x 4 days Patient not taking: Reported on 02/09/2017 02/01/17   Loren Racer, MD     Allergies  Allergen Reactions  . Sulfa Antibiotics Anaphylaxis  . Iodinated Diagnostic Agents Other (See Comments)    Patient complained of throat/ uvula swelling s/p injection on 02/01/2017 but states she isnt allergic to the dye and has had several times prior   . Codeine Other (See Comments)    Unknown - patient said mother told her she was allergic as a kid  . Dilaudid [Hydromorphone Hcl] Itching  . Other     Other reaction(s): Info Not Available  . Penicillins Other (See Comments)    Has patient had a PCN reaction causing immediate rash, facial/tongue/throat swelling, SOB or lightheadedness with hypotension: Unknown Has patient had a PCN reaction causing severe rash involving mucus membranes or skin necrosis: Unknown Has patient had a PCN reaction that required hospitalization: Unknown Has patient had a PCN reaction occurring within the last 10 years: Unknown If all of the above answers are "NO",  then may proceed with Cephalosporin use.  Had a reaction when she was young.   Marland Kitchen Spironolactone Other (See Comments)    "GI Issues"       Objective:  Physical Exam     Results for orders placed or performed in visit on 02/09/17  POCT Microscopic Urinalysis ([facility])  Result Value Ref Range   WBC,UR,HPF,POC Few (A) None WBC/hpf   RBC,UR,HPF,POC None None RBC/hpf   Bacteria Many (A) None, Too numerous to count   Mucus Present (A) Absent   Epithelial Cells, UR Per Microscopy  Many (A) None, Too numerous to count cells/hpf  POCT urinalysis dipstick  Result Value Ref Range   Color, UA yellow yellow   Clarity, UA cloudy (A) clear   Glucose, UA negative negative mg/dL   Bilirubin, UA negative negative   Ketones, POC UA negative negative mg/dL   Spec Grav, UA >=4.098 (A) 1.010 - 1.025   Blood, UA small (A) negative   pH, UA 5.5 5.0 - 8.0   Protein Ur, POC negative negative mg/dL   Urobilinogen, UA 0.2 0.2 or 1.0 E.U./dL   Nitrite, UA Negative Negative   Leukocytes, UA Negative Negative       Assessment & Plan:   Problem List Items Addressed This Visit    Fibromyalgia   Relevant Medications   traMADol (ULTRAM) 50 MG tablet   Benign essential HTN   Chronic fatigue syndrome   Anxiety and depression   IBS (irritable bowel syndrome)   OSA (obstructive sleep apnea)   DDD (degenerative disc disease), cervical   Relevant Medications   traMADol (ULTRAM) 50 MG tablet   BMI 45.0-49.9, adult (HCC)    Other Visit Diagnoses    Flank pain    -  Primary   Relevant Orders   POCT Microscopic Urinalysis ([facility]) (Completed)   POCT urinalysis dipstick (Completed)   Urine Culture   Voiding difficulty       Relevant Orders   POCT Microscopic Urinalysis ([facility]) (Completed)   POCT urinalysis dipstick (Completed)   Urine Culture     Await Urine culture results. Referrals have been ordered previously. She needs to self advocate and schedule the visits. Names and numbers provided  again.  Return in about 4 weeks (around 03/09/2017) for re-evaluation of chronic issues.   Fernande Bras, PA-C Primary Care at [facility]

## 2017-02-09 NOTE — Patient Instructions (Addendum)
Rheumatology: Zenovia Jordan, MD [phone] [facility] [phone] [facility] [phone]       IF you received an x-ray today, you will receive an invoice from [facility]. Please contact [facility] at [phone] with questions or concerns regarding your invoice.   IF you received labwork today, you will receive an invoice from [facility]. Please contact [facility] at [phone] with questions or concerns regarding your invoice.   Our billing staff will not be able to assist you with questions regarding bills from these companies.  You will be contacted with the lab results as soon as they are available. The fastest way to get your results is to activate your My Chart account. Instructions are located on the last page of this paperwork. If you have not heard from [place] regarding the results in 2 weeks, please contact this office.

## 2017-02-13 DIAGNOSIS — M797 Fibromyalgia: Secondary | ICD-10-CM | POA: Diagnosis not present

## 2017-02-13 DIAGNOSIS — K589 Irritable bowel syndrome without diarrhea: Secondary | ICD-10-CM | POA: Diagnosis not present

## 2017-02-13 DIAGNOSIS — R69 Illness, unspecified: Secondary | ICD-10-CM | POA: Diagnosis not present

## 2017-02-13 DIAGNOSIS — M5030 Other cervical disc degeneration, unspecified cervical region: Secondary | ICD-10-CM | POA: Diagnosis not present

## 2017-02-13 DIAGNOSIS — I10 Essential (primary) hypertension: Secondary | ICD-10-CM | POA: Diagnosis not present

## 2017-02-19 ENCOUNTER — Telehealth: Payer: Self-pay

## 2017-02-20 ENCOUNTER — Other Ambulatory Visit: Payer: Self-pay | Admitting: Physician Assistant

## 2017-02-20 ENCOUNTER — Encounter: Payer: Self-pay | Admitting: Physician Assistant

## 2017-02-20 DIAGNOSIS — I10 Essential (primary) hypertension: Secondary | ICD-10-CM

## 2017-02-20 DIAGNOSIS — R339 Retention of urine, unspecified: Secondary | ICD-10-CM

## 2017-02-20 DIAGNOSIS — N200 Calculus of kidney: Secondary | ICD-10-CM

## 2017-02-21 MED ORDER — POTASSIUM CHLORIDE CRYS ER 20 MEQ PO TBCR: 20.0000 meq | EXTENDED_RELEASE_TABLET | Freq: Two times a day (BID) | ORAL | 0 refills | Status: DC

## 2017-02-21 NOTE — Telephone Encounter (Signed)
My Chart req refill Potassium Appt 9/12 with Chelle Refilled

## 2017-02-22 DIAGNOSIS — R339 Retention of urine, unspecified: Secondary | ICD-10-CM | POA: Insufficient documentation

## 2017-03-05 ENCOUNTER — Ambulatory Visit: Payer: Medicare HMO | Admitting: Physician Assistant

## 2017-03-05 ENCOUNTER — Encounter: Payer: Self-pay | Admitting: Physician Assistant

## 2017-03-13 ENCOUNTER — Ambulatory Visit (INDEPENDENT_AMBULATORY_CARE_PROVIDER_SITE_OTHER): Payer: Medicare HMO | Admitting: Physician Assistant

## 2017-03-13 ENCOUNTER — Encounter: Payer: Self-pay | Admitting: Physician Assistant

## 2017-03-13 VITALS — BP 138/92 | HR 84 | Temp 98.4°F | Resp 16 | Ht 69.0 in | Wt 344.2 lb

## 2017-03-13 DIAGNOSIS — R339 Retention of urine, unspecified: Secondary | ICD-10-CM

## 2017-03-13 DIAGNOSIS — R109 Unspecified abdominal pain: Secondary | ICD-10-CM

## 2017-03-13 DIAGNOSIS — R635 Abnormal weight gain: Secondary | ICD-10-CM

## 2017-03-13 DIAGNOSIS — R39198 Other difficulties with micturition: Secondary | ICD-10-CM | POA: Diagnosis not present

## 2017-03-13 DIAGNOSIS — M797 Fibromyalgia: Secondary | ICD-10-CM | POA: Diagnosis not present

## 2017-03-13 DIAGNOSIS — Z23 Encounter for immunization: Secondary | ICD-10-CM

## 2017-03-13 DIAGNOSIS — E039 Hypothyroidism, unspecified: Secondary | ICD-10-CM

## 2017-03-13 DIAGNOSIS — R400 Somnolence: Secondary | ICD-10-CM

## 2017-03-13 LAB — POCT URINALYSIS DIP (MANUAL ENTRY)
BILIRUBIN UA: NEGATIVE
BILIRUBIN UA: NEGATIVE mg/dL
Glucose, UA: NEGATIVE mg/dL
LEUKOCYTES UA: NEGATIVE
Nitrite, UA: NEGATIVE
PH UA: 6.5 (ref 5.0–8.0)
Protein Ur, POC: NEGATIVE mg/dL
Spec Grav, UA: 1.02 (ref 1.010–1.025)
Urobilinogen, UA: 0.2 E.U./dL

## 2017-03-13 MED ORDER — TRAMADOL HCL 50 MG PO TABS: 50.0000 mg | ORAL_TABLET | Freq: Three times a day (TID) | ORAL | 0 refills | Status: DC | PRN

## 2017-03-13 NOTE — Progress Notes (Signed)
Patient ID: Brianna Roberts, female    DOB: 13-Jan-1966, 51 y.o.   MRN: [id number]  PCP: Harrison Mons, PA-C  Chief Complaint  Patient presents with  . Follow-up    flank pain, lower back pain, abdominal pain     Subjective:   Presents for evaluation of chronic issues, which are multiple and complicated by poor/lack of local support system.  Chronic pain, fibromyalgia and flank pain, chronic fatigue syndrome and depression have been the primary issues since I met her in April.  Missed her appointments with me and her therapist last week, having lost track of what day it was. Her therapist is now out of the office until October, and she will follow-up again then. "I think if I saw her today, I wouldn't know what to say." "I'm actually happy down here. So what's going ?"  "Pain is bad, but I'm heavier." "I don't know what to say anymore." "I'm just in one of those spaces where I'm sick of myself again." "Can't walk up the steps without dying, so I usually just decide to stay in bed."  Tramadol, amitriptyline and methocarbamol were working. Had been able to eliminate the NSAID. Then she got a letter that her benefit plan no longer covers methocarbamol. Did not try resuming the meloxicam when her pain worsened off the methocarbamol. Has been using diazepam instead, which she has done previously, and still feels awful. Feels exhausted. Difficulty staying awake. Sleeping 15 hours at a time. Describes a history of narcolepsy, developed following 3 years of efforts to treat insomnia, though she has not previously reported that diagnosis.  Is scheduled to attend the information session for Medical Weight Management on 03/19/2017. Is scheduled to see rheumatology, neurology. Still has not heard from urology, despite referral x 2.   Review of Systems As above.    Patient Active Problem List   Diagnosis Date Noted  . Incomplete emptying of bladder 02/22/2017  . Prediabetes  12/31/2016  . Hypomagnesemia 12/27/2016  . Post concussion syndrome 10/06/2016  . 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 Fibromyalgia 10/05/2016  . Benign essential HTN 10/05/2016  . Hypothyroidism 10/05/2016  . Allergic rhinitis 10/05/2016  . Urticaria 10/05/2016  . Chronic fatigue syndrome 10/05/2016  . Asthma, chronic 10/05/2016  . HSV (herpes simplex virus) anogenital infection 10/05/2016  . Shingles 10/05/2016  . Anxiety and depression 10/05/2016  . Bilateral leg edema 10/05/2016  . Ovarian cyst 10/05/2016  . Nephrolithiasis 10/05/2016  . IBS (irritable bowel syndrome) 10/05/2016  . OSA (obstructive sleep apnea) 10/05/2016  . Left-sided weakness 10/05/2016  . Migraine 10/05/2016  . BMI 45.0-49.9, adult ([place]) 10/05/2016  . DDD (degenerative disc disease), cervical   . Insomnia   . Right lateral epicondylitis 03/29/2015     Prior to Admission medications   Medication Sig Start Date End Date Taking? Authorizing Provider  acetaminophen (TYLENOL) 500 MG tablet Take 500-1,000 mg by mouth 2 (two) times daily as needed for mild pain.   Yes [provider]  acyclovir (ZOVIRAX) 200 MG capsule Take 1 capsule (200 mg total) by mouth 2 (two) times daily. 10/05/16  Yes Meloney Feld, PA-C  albuterol (PROVENTIL HFA;VENTOLIN HFA) 108 (90 Base) MCG/ACT inhaler Inhale 1-2 puffs into the lungs every 6 (six) hours as needed for wheezing or shortness of breath. Patient taking differently: Inhale 2 puffs into the lungs every 6 (six) hours as needed for wheezing or shortness of breath.  10/05/16  Yes Harrison Mons,  PA-C  amitriptyline (ELAVIL) 75 MG tablet Take 1 tablet (75 mg total) by mouth at bedtime. 12/27/16  Yes Siennah Barrasso, PA-C  amLODipine (NORVASC) 2.5 MG tablet Take 2.5 mg by mouth daily.  12/28/16  Yes [provider]  ARIPiprazole (ABILIFY) 10 MG tablet Take 1 tablet (10 mg total) by mouth daily. 10/05/16  Yes Cerena Baine, PA-C    budesonide-formoterol (SYMBICORT) 160-4.5 MCG/ACT inhaler Inhale 2 puffs into the lungs 2 (two) times daily. 12/27/16  Yes Denissa Cozart, PA-C  diazepam (VALIUM) 5 MG tablet Take 1 tablet (5 mg total) by mouth every 8 (eight) hours as needed for anxiety. 12/27/16  Yes Aidric Endicott, PA-C  diphenhydrAMINE (BENADRYL) 25 mg capsule Take 25 mg by mouth 2 (two) times daily.   Yes [provider]  fluticasone (FLONASE) 50 MCG/ACT nasal spray Place 1 spray into both nostrils daily. Patient taking differently: Place 2 sprays into both nostrils daily.  10/05/16  Yes Kellyann Ordway, PA-C  furosemide (LASIX) 40 MG tablet Take 1 tablet (40 mg total) by mouth 2 (two) times daily. 10/05/16  Yes Rehmat Murtagh, PA-C  hydrOXYzine (ATARAX/VISTARIL) 10 MG tablet Take 1 tablet (10 mg total) by mouth at bedtime. Patient taking differently: Take 10 mg by mouth at bedtime as needed for itching (angioedema).  10/05/16  Yes Alvan Culpepper, PA-C  ipratropium-albuterol (DUONEB) 0.5-2.5 (3) MG/3ML SOLN Take 3 mLs by nebulization every 6 (six) hours as needed (for breathing). 10/05/16  Yes Lilliann Rossetti, PA-C  levothyroxine (SYNTHROID, LEVOTHROID) 25 MCG tablet Take 1 tablet (25 mcg total) by mouth daily before breakfast. Patient taking differently: Take 25 mcg by mouth daily.  10/05/16  Yes Dariusz Brase, PA-C  losartan (COZAAR) 100 MG tablet Take 1 tablet (100 mg total) by mouth daily. 10/05/16  Yes Dezmin Kittelson, PA-C  meloxicam (MOBIC) 15 MG tablet Take 1 tablet (15 mg total) by mouth daily. 12/27/16  Yes Ayumi Wangerin, PA-C  methocarbamol (ROBAXIN) 500 MG tablet Take 2 tablets (1,000 mg total) by mouth every 8 (eight) hours as needed for muscle spasms. 02/01/17  Yes Julianne Rice, MD  metoprolol (LOPRESSOR) 100 MG tablet Take 1 tablet (100 mg total) by mouth 2 (two) times daily. 10/05/16  Yes Harrison Mons, PA-C  Nerve Stimulator (PRO COMFORT TENS UNIT) DEVI 1 Device by Does not apply route daily as needed.  12/27/16  Yes Mercy Malena, PA-C  nystatin (MYCOSTATIN/NYSTOP) powder Apply topically 2 (two) times daily. Patient taking differently: Apply 8 g topically 2 (two) times daily.  12/27/16 12/27/17 Yes Tore Carreker, PA-C  nystatin cream (MYCOSTATIN) Apply 1 application topically 2 (two) times daily.   Yes [provider]  ondansetron (ZOFRAN) 4 MG tablet Take 1 tablet (4 mg total) by mouth every 8 (eight) hours as needed for nausea or vomiting. 02/01/17  Yes Julianne Rice, MD  oxyCODONE (OXY IR/ROXICODONE) 5 MG immediate release tablet Take 1 tablet (5 mg total) by mouth every 4 (four) hours as needed for severe pain. 12/27/16  Yes Kimba Lottes, PA-C  potassium chloride SA (K-DUR,KLOR-CON) 20 MEQ tablet Take 1 tablet (20 mEq total) by mouth 2 (two) times daily. 02/21/17  Yes Avelyn Touch, PA-C  Probiotic Product (ALIGN) 4 MG CAPS Take 4 mg by mouth daily.   Yes [provider]  sertraline (ZOLOFT) 100 MG tablet Take 1 tablet (100 mg total) by mouth daily. 10/05/16  Yes Lundynn Cohoon, PA-C  traMADol (ULTRAM) 50 MG tablet Take 1-2 tablets (50-100 mg total) by mouth every 8 (eight)  hours as needed. 02/09/17  Yes Domingue Coltrain, PA-C  triamcinolone ointment (KENALOG) 0.5 % Apply 1 application topically 2 (two) times daily.   Yes [provider]  trolamine salicylate (ASPERCREME) 10 % cream Apply 1 application topically at bedtime as needed for muscle pain.   Yes [provider]     Allergies  Allergen Reactions  . Sulfa Antibiotics Anaphylaxis  . Iodinated Diagnostic Agents Other (See Comments)    Patient complained of throat/ uvula swelling s/p injection on 02/01/2017 but states she isnt allergic to the dye and has had several times prior   . Codeine Other (See Comments)    Unknown - patient said mother told her she was allergic as a kid  . Dilaudid [Hydromorphone Hcl] Itching  . Other     Other reaction(s): Info Not Available  . Penicillins Other (See  Comments)    Has patient had a PCN reaction causing immediate rash, facial/tongue/throat swelling, SOB or lightheadedness with hypotension: Unknown Has patient had a PCN reaction causing severe rash involving mucus membranes or skin necrosis: Unknown Has patient had a PCN reaction that required hospitalization: Unknown Has patient had a PCN reaction occurring within the last 10 years: Unknown If all of the above answers are "NO", then may proceed with Cephalosporin use.  Had a reaction when she was young.   Marland Kitchen Spironolactone Other (See Comments)    "GI Issues"       Objective:  Physical Exam  Constitutional: She is oriented to person, place, and time. She appears well-developed and well-nourished. She is active and cooperative. No distress.  BP (!) 138/92   Pulse 84   Temp 98.4 F (36.9 C)   Resp 16   Ht 5' 9"  (1.753 m)   Wt (!) 344 lb 3.2 oz (156.1 kg)   SpO2 96%   BMI 50.83 kg/m   HENT:  Head: Normocephalic and atraumatic.  Right Ear: Hearing normal.  Left Ear: Hearing normal.  Eyes: Conjunctivae are normal. No scleral icterus.  Neck: Normal range of motion. Neck supple. No thyromegaly present.  Cardiovascular: Normal rate, regular rhythm and normal heart sounds.   Pulses:      Radial pulses are 2+ on the right side, and 2+ on the left side.  Pulmonary/Chest: Effort normal and breath sounds normal.  Lymphadenopathy:       Head (right side): No tonsillar, no preauricular, no posterior auricular and no occipital adenopathy present.       Head (left side): No tonsillar, no preauricular, no posterior auricular and no occipital adenopathy present.    She has no cervical adenopathy.       Right: No supraclavicular adenopathy present.       Left: No supraclavicular adenopathy present.  Neurological: She is alert and oriented to person, place, and time. No sensory deficit.  Skin: Skin is warm, dry and intact. No rash noted. No cyanosis or erythema. Nails show no clubbing.    Psychiatric: She has a normal mood and affect. Her speech is normal and behavior is normal.       Wt Readings from Last 3 Encounters:  03/13/17 (!) 344 lb 3.2 oz (156.1 kg)  02/09/17 (!) 338 lb (153.3 kg)  02/01/17 (!) 332 lb 6 oz (150.8 kg)  01/22/17 332 lb 12/27/16 330 lb 10/05/16 328 lb    Assessment & Plan:   Problem List Items Addressed This Visit    Fibromyalgia - Primary   Relevant Medications   traMADol (ULTRAM) 50 MG  tablet   Hypothyroidism   Relevant Orders   TSH (Completed)   Incomplete emptying of bladder   Relevant Orders   POCT urinalysis dipstick (Completed)    Other Visit Diagnoses    Weight gain       Relevant Orders   TSH (Completed)   Uncontrolled daytime somnolence       Relevant Orders   CBC with Differential/Platelet (Completed)   Comprehensive metabolic panel (Completed)   Need for influenza vaccination       Relevant Orders   Flu Vaccine QUAD 36+ mos IM (Completed)   Flank pain       Voiding difficulty           Return in about 2 months (around 05/13/2017) for re-evalaution of fibromyalgia, weight, fatigue, etc.   Fara Chute, PA-C Primary Care at [place]

## 2017-03-13 NOTE — Patient Instructions (Addendum)
[facility] [phone]. I've referred you there twice. Please call them to schedule a visit.  Try adding back the meloxicam. Let me know if you still need a muscle relaxer. I would try tizanidine next.  Contact your insurance plan to find out if they cover tetanus or Tdap.    IF you received an x-ray today, you will receive an invoice from [facility]. Please contact [facility] at [phone] with questions or concerns regarding your invoice.   IF you received labwork today, you will receive an invoice from [facility]. Please contact [facility] at [phone] with questions or concerns regarding your invoice.   Our billing staff will not be able to assist you with questions regarding bills from these companies.  You will be contacted with the lab results as soon as they are available. The fastest way to get your results is to activate your My Chart account. Instructions are located on the last page of this paperwork. If you have not heard from [place] regarding the results in 2 weeks, please contact this office.

## 2017-03-14 LAB — COMPREHENSIVE METABOLIC PANEL
ALT: 16 IU/L (ref 0–32)
AST: 19 IU/L (ref 0–40)
Albumin/Globulin Ratio: 1.8 (ref 1.2–2.2)
Albumin: 4.3 g/dL (ref 3.5–5.5)
Alkaline Phosphatase: 79 IU/L (ref 39–117)
BUN/Creatinine Ratio: 8 — ABNORMAL LOW (ref 9–23)
BUN: 7 mg/dL (ref 6–24)
Bilirubin Total: 0.5 mg/dL (ref 0.0–1.2)
CALCIUM: 9 mg/dL (ref 8.7–10.2)
CO2: 24 mmol/L (ref 20–29)
CREATININE: 0.9 mg/dL (ref 0.57–1.00)
Chloride: 100 mmol/L (ref 96–106)
GFR, EST AFRICAN AMERICAN: 86 mL/min/{1.73_m2} (ref >59)
GFR, EST NON AFRICAN AMERICAN: 75 mL/min/{1.73_m2} (ref >59)
GLUCOSE: 102 mg/dL — AB (ref 65–99)
Globulin, Total: 2.4 g/dL (ref 1.5–4.5)
Potassium: 3.9 mmol/L (ref 3.5–5.2)
Sodium: 139 mmol/L (ref 134–144)
TOTAL PROTEIN: 6.7 g/dL (ref 6.0–8.5)

## 2017-03-14 LAB — CBC WITH DIFFERENTIAL/PLATELET
Basophils Absolute: 0.1 10*3/uL (ref 0.0–0.2)
Basos: 1 %
EOS (ABSOLUTE): 0.2 10*3/uL (ref 0.0–0.4)
EOS: 2 %
HEMATOCRIT: 38.4 % (ref 34.0–46.6)
HEMOGLOBIN: 12.6 g/dL (ref 11.1–15.9)
Immature Grans (Abs): 0 10*3/uL (ref 0.0–0.1)
Immature Granulocytes: 0 %
LYMPHS ABS: 4 10*3/uL — AB (ref 0.7–3.1)
Lymphs: 42 %
MCH: 26.4 pg — AB (ref 26.6–33.0)
MCHC: 32.8 g/dL (ref 31.5–35.7)
MCV: 80 fL (ref 79–97)
MONOS ABS: 0.7 10*3/uL (ref 0.1–0.9)
Monocytes: 8 %
Neutrophils Absolute: 4.6 10*3/uL (ref 1.4–7.0)
Neutrophils: 47 %
PLATELETS: 398 10*3/uL — AB (ref 150–379)
RBC: 4.78 x10E6/uL (ref 3.77–5.28)
RDW: 14.4 % (ref 12.3–15.4)
WBC: 9.6 10*3/uL (ref 3.4–10.8)

## 2017-03-14 LAB — TSH: TSH: 4.46 u[IU]/mL (ref 0.450–4.500)

## 2017-03-14 LAB — URINE CULTURE

## 2017-03-17 DIAGNOSIS — G894 Chronic pain syndrome: Secondary | ICD-10-CM | POA: Insufficient documentation

## 2017-03-17 HISTORY — DX: Chronic pain syndrome: G89.4

## 2017-03-17 NOTE — Assessment & Plan Note (Signed)
Increased pain since stopping methocarbamol, inadequate relief with use of diazepam. Encouraged resumption of meloxicam. Rx tramadol.

## 2017-03-17 NOTE — Assessment & Plan Note (Signed)
Given increased weight gain, depression, fatigue, update labs.

## 2017-03-17 NOTE — Assessment & Plan Note (Signed)
Repeat urine culture today. Provided contact information for [facility] so that she can contact them directly to schedule.

## 2017-03-19 ENCOUNTER — Encounter (INDEPENDENT_AMBULATORY_CARE_PROVIDER_SITE_OTHER): Payer: Medicare HMO

## 2017-03-26 ENCOUNTER — Encounter: Payer: Self-pay | Admitting: Physician Assistant

## 2017-03-26 ENCOUNTER — Other Ambulatory Visit: Payer: Self-pay | Admitting: Physician Assistant

## 2017-03-26 DIAGNOSIS — F32A Depression, unspecified: Secondary | ICD-10-CM

## 2017-03-26 DIAGNOSIS — F419 Anxiety disorder, unspecified: Secondary | ICD-10-CM

## 2017-03-26 DIAGNOSIS — M797 Fibromyalgia: Secondary | ICD-10-CM

## 2017-03-26 DIAGNOSIS — F329 Major depressive disorder, single episode, unspecified: Secondary | ICD-10-CM

## 2017-03-27 ENCOUNTER — Other Ambulatory Visit: Payer: Self-pay | Admitting: Physician Assistant

## 2017-03-27 DIAGNOSIS — M797 Fibromyalgia: Secondary | ICD-10-CM

## 2017-03-28 MED ORDER — GLUCOSE BLOOD VI STRP: ORAL_STRIP | 99 refills | Status: DC

## 2017-03-28 MED ORDER — MELOXICAM 15 MG PO TABS: 15.0000 mg | ORAL_TABLET | Freq: Every day | ORAL | 0 refills | Status: DC

## 2017-03-28 MED ORDER — LANCETS THIN MISC: 99 refills | Status: DC

## 2017-03-28 MED ORDER — DIAZEPAM 5 MG PO TABS: 5.0000 mg | ORAL_TABLET | Freq: Three times a day (TID) | ORAL | 0 refills | Status: DC | PRN

## 2017-03-28 NOTE — Telephone Encounter (Signed)
Meds ordered this encounter  Medications  . meloxicam (MOBIC) 15 MG tablet    Sig: Take 1 tablet (15 mg total) by mouth daily.    Dispense:  90 tablet    Refill:  0  . diazepam (VALIUM) 5 MG tablet    Sig: Take 1 tablet (5 mg total) by mouth every 8 (eight) hours as needed for anxiety.    Dispense:  90 tablet    Refill:  0

## 2017-03-29 ENCOUNTER — Telehealth: Payer: Self-pay

## 2017-03-29 NOTE — Telephone Encounter (Signed)
Pt advised that rx for valium was being faxed to pharmacy

## 2017-04-01 ENCOUNTER — Telehealth: Payer: Self-pay | Admitting: Physician Assistant

## 2017-04-01 ENCOUNTER — Other Ambulatory Visit: Payer: Self-pay | Admitting: Physician Assistant

## 2017-04-01 DIAGNOSIS — J3089 Other allergic rhinitis: Secondary | ICD-10-CM

## 2017-04-02 ENCOUNTER — Ambulatory Visit (INDEPENDENT_AMBULATORY_CARE_PROVIDER_SITE_OTHER): Payer: Medicare HMO | Admitting: Physician Assistant

## 2017-04-02 ENCOUNTER — Encounter: Payer: Self-pay | Admitting: Physician Assistant

## 2017-04-02 VITALS — BP 130/84 | HR 82 | Temp 98.1°F | Resp 18 | Ht 69.0 in | Wt 341.4 lb

## 2017-04-02 DIAGNOSIS — G43911 Migraine, unspecified, intractable, with status migrainosus: Secondary | ICD-10-CM | POA: Diagnosis not present

## 2017-04-02 DIAGNOSIS — J019 Acute sinusitis, unspecified: Secondary | ICD-10-CM

## 2017-04-02 MED ORDER — AZITHROMYCIN 250 MG PO TABS: ORAL_TABLET | ORAL | 0 refills | Status: DC

## 2017-04-02 MED ORDER — AZELASTINE HCL 0.1 % NA SOLN: 2.0000 | Freq: Two times a day (BID) | NASAL | 0 refills | Status: DC

## 2017-04-02 MED ORDER — PREDNISONE 20 MG PO TABS: ORAL_TABLET | ORAL | 0 refills | Status: DC

## 2017-04-02 NOTE — Patient Instructions (Addendum)
We recommend that you schedule a mammogram for breast cancer screening. Typically, you do not need a referral to do this. Please contact a local imaging center to schedule your mammogram.  [facility] - [phone]  *ask for the Radiology Department [facility]) - [phone] or [phone]  [facility] - [phone] [facility] - [phone] [facility] - [phone]  *ask for the Radiology Department [facility] - [phone]  *ask for the Radiology Department [facility] - [phone]  *ask for the Mammography Department [facility] - [phone]     IF you received an x-ray today, you will receive an invoice from [facility]. Please contact  Radiology at [phone] with questions or concerns regarding your invoice.   IF you received labwork today, you will receive an invoice from [facility]. Please contact [facility] at [phone] with questions or concerns regarding your invoice.   Our billing staff will not be able to assist you with questions regarding bills from these companies.  You will be contacted with the lab results as soon as they are available. The fastest way to get your results is to activate your My Chart account. Instructions are located on the last page of this paperwork. If you have not heard from us regarding the results in 2 weeks, please contact this office.      

## 2017-04-02 NOTE — Progress Notes (Signed)
Patient ID: Brianna Roberts, female    DOB: [birth year], 51 y.o.   MRN: [id number]  PCP: Porfirio Oar, PA-C  Chief Complaint  Patient presents with  . Sinus Problem    x4 days, pt states her sinuses have a lot pressure and swelling and pt reports a migraine. Pt states she used some ice help with the swelling.   . Ear Pain    Right ear, pt states along with the sinus pressure she is having ear pain    Subjective:   Presents for evaluation of 4 days of sinus pain and headache.  She notes that she's had increased congestion on the RIGHT for about 4 weeks. The CPAP makes a different sound.  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 She has had sinus surgery x 2.  With the HA, she also has RIGHT ear pain and has developed her classic migraine headache. No fever, chills. She does have photophobia and phonophobia. No atypical features.`  Mood is much improved of late and she is pleased to have lost a few pounds. She has not yet scheduled with urology for chronic urinary symptoms. She reports that she didn't check her messages in My Chart after the last time she reached out to me, so did not get my reply until last night.    Review of Systems As above.    Patient Active Problem List   Diagnosis Date Noted  . Chronic pain syndrome 03/17/2017  . Incomplete emptying of bladder 02/22/2017  . Prediabetes 12/31/2016  . Hypomagnesemia 12/27/2016  . Post concussion syndrome 10/06/2016  . 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 Fibromyalgia 10/05/2016  . Benign essential HTN 10/05/2016  . Hypothyroidism 10/05/2016  . Allergic rhinitis 10/05/2016  . Urticaria 10/05/2016  . Chronic fatigue syndrome 10/05/2016  . Asthma, chronic 10/05/2016  . HSV (herpes simplex virus) anogenital infection 10/05/2016  . Shingles 10/05/2016  . Anxiety and depression 10/05/2016  . Bilateral leg edema 10/05/2016  . Ovarian cyst 10/05/2016  .  Nephrolithiasis 10/05/2016  . IBS (irritable bowel syndrome) 10/05/2016  . OSA (obstructive sleep apnea) 10/05/2016  . Left-sided weakness 10/05/2016  . Migraine 10/05/2016  . BMI 45.0-49.9, adult (HCC) 10/05/2016  . DDD (degenerative disc disease), cervical   . Insomnia   . Right lateral epicondylitis 03/29/2015     Prior to Admission medications   Medication Sig Start Date End Date Taking? Authorizing Provider  acetaminophen (TYLENOL) 500 MG tablet Take 500-1,000 mg by mouth 2 (two) times daily as needed for mild pain.   Yes [provider]  acyclovir (ZOVIRAX) 200 MG capsule Take 1 capsule (200 mg total) by mouth 2 (two) times daily. 10/05/16  Yes Aviyon Hocevar, PA-C  albuterol (PROVENTIL HFA;VENTOLIN HFA) 108 (90 Base) MCG/ACT inhaler Inhale 1-2 puffs into the lungs every 6 (six) hours as needed for wheezing or shortness of breath. Patient taking differently: Inhale 2 puffs into the lungs every 6 (six) hours as needed for wheezing or shortness of breath.  10/05/16  Yes Jibri Schriefer, PA-C  amitriptyline (ELAVIL) 75 MG tablet Take 1 tablet (75 mg total) by mouth at bedtime. 12/27/16  Yes Fleming Prill, PA-C  amLODipine (NORVASC) 2.5 MG tablet Take 2.5 mg by mouth daily.  12/28/16  Yes [provider]  ARIPiprazole (ABILIFY) 10 MG tablet Take 1 tablet (10 mg total) by mouth daily. 10/05/16  Yes Iola Turri, PA-C  budesonide-formoterol (SYMBICORT) 160-4.5 MCG/ACT inhaler  Inhale 2 puffs into the lungs 2 (two) times daily. 12/27/16  Yes Mell Mellott, PA-C  diazepam (VALIUM) 5 MG tablet Take 1 tablet (5 mg total) by mouth every 8 (eight) hours as needed for anxiety. 03/28/17  Yes Nicolis Boody, PA-C  diphenhydrAMINE (BENADRYL) 25 mg capsule Take 25 mg by mouth 2 (two) times daily.   Yes [provider]  fluticasone (FLONASE) 50 MCG/ACT nasal spray USE 1 SPRAY IN EACH NOSTRIL DAILY. 04/02/17  Yes Hortence Charter, PA-C  furosemide (LASIX) 40 MG tablet Take 1 tablet  (40 mg total) by mouth 2 (two) times daily. 10/05/16  Yes Seri Kimmer, PA-C  glucose blood test strip Use as instructed 03/28/17  Yes Mychelle Kendra, PA-C  hydrOXYzine (ATARAX/VISTARIL) 10 MG tablet Take 1 tablet (10 mg total) by mouth at bedtime. Patient taking differently: Take 10 mg by mouth at bedtime as needed for itching (angioedema).  10/05/16  Yes Kenyonna Micek, PA-C  ipratropium-albuterol (DUONEB) 0.5-2.5 (3) MG/3ML SOLN Take 3 mLs by nebulization every 6 (six) hours as needed (for breathing). 10/05/16  Yes Porfirio Oar, PA-C  Lancets Thin MISC Use to check home glucose daily 03/28/17  Yes Mateus Rewerts, PA-C  levothyroxine (SYNTHROID, LEVOTHROID) 25 MCG tablet Take 1 tablet (25 mcg total) by mouth daily before breakfast. Patient taking differently: Take 25 mcg by mouth daily.  10/05/16  Yes Reynol Arnone, PA-C  losartan (COZAAR) 100 MG tablet Take 1 tablet (100 mg total) by mouth daily. 10/05/16  Yes Ajooni Karam, PA-C  meloxicam (MOBIC) 15 MG tablet Take 1 tablet (15 mg total) by mouth daily. 03/28/17  Yes Goldman Birchall, PA-C  methocarbamol (ROBAXIN) 500 MG tablet Take 2 tablets (1,000 mg total) by mouth every 8 (eight) hours as needed for muscle spasms. 02/01/17  Yes Loren Racer, MD  metoprolol (LOPRESSOR) 100 MG tablet Take 1 tablet (100 mg total) by mouth 2 (two) times daily. 10/05/16  Yes Porfirio Oar, PA-C  Nerve Stimulator (PRO COMFORT TENS UNIT) DEVI 1 Device by Does not apply route daily as needed. 12/27/16  Yes Seleena Reimers, PA-C  nystatin (MYCOSTATIN/NYSTOP) powder Apply topically 2 (two) times daily. Patient taking differently: Apply 8 g topically 2 (two) times daily.  12/27/16 12/27/17 Yes Mersadies Petree, PA-C  nystatin cream (MYCOSTATIN) Apply 1 application topically 2 (two) times daily.   Yes [provider]  ondansetron (ZOFRAN) 4 MG tablet Take 1 tablet (4 mg total) by mouth every 8 (eight) hours as needed for nausea or vomiting. 02/01/17  Yes  Loren Racer, MD  oxyCODONE (OXY IR/ROXICODONE) 5 MG immediate release tablet Take 1 tablet (5 mg total) by mouth every 4 (four) hours as needed for severe pain. 12/27/16  Yes Yulonda Wheeling, PA-C  potassium chloride SA (K-DUR,KLOR-CON) 20 MEQ tablet Take 1 tablet (20 mEq total) by mouth 2 (two) times daily. 02/21/17  Yes Reeya Bound, PA-C  Probiotic Product (ALIGN) 4 MG CAPS Take 4 mg by mouth daily.   Yes [provider]  sertraline (ZOLOFT) 100 MG tablet Take 1 tablet (100 mg total) by mouth daily. 10/05/16  Yes Ballard Budney, PA-C  traMADol (ULTRAM) 50 MG tablet Take 1-2 tablets (50-100 mg total) by mouth every 8 (eight) hours as needed. 03/13/17  Yes Chisa Kushner, PA-C  triamcinolone ointment (KENALOG) 0.5 % Apply 1 application topically 2 (two) times daily.   Yes [provider]  trolamine salicylate (ASPERCREME) 10 % cream Apply 1 application topically at bedtime as needed for muscle pain.   Yes [provider]     Allergies  Allergen Reactions  . Sulfa Antibiotics Anaphylaxis  . Iodinated Diagnostic Agents Other (See Comments)    Patient complained of throat/ uvula swelling s/p injection on 02/01/2017 but states she isnt allergic to the dye and has had several times prior   . Codeine Other (See Comments)    Unknown - patient said mother told her she was allergic as a kid  . Dilaudid [Hydromorphone Hcl] Itching  . Other     Other reaction(s): Info Not Available  . Penicillins Other (See Comments)    Has patient had a PCN reaction causing immediate rash, facial/tongue/throat swelling, SOB or lightheadedness with hypotension: Unknown Has patient had a PCN reaction causing severe rash involving mucus membranes or skin necrosis: Unknown Has patient had a PCN reaction that required hospitalization: Unknown Has patient had a PCN reaction occurring within the last 10 years: Unknown If all of the above answers are "NO", then may proceed with Cephalosporin  use.  Had a reaction when she was young.   Marland Kitchen Spironolactone Other (See Comments)    "GI Issues"       Objective:  Physical Exam  Constitutional: She is oriented to person, place, and time. She appears well-developed and well-nourished. She is active and cooperative. She appears distressed (mild, wearing sunglasses in the exam room, lying on the exam table).  BP 130/84 (BP Location: Left Arm, Patient Position: Sitting, Cuff Size: Large)   Pulse 82   Temp 98.1 F (36.7 C) (Oral)   Resp 18   Ht  (1.753 m)   Wt (!) 341 lb 6.4 oz (154.9 kg)   SpO2 97%   BMI 50.42 kg/m   HENT:  Head: Normocephalic and atraumatic.  Right Ear: Hearing, tympanic membrane and ear canal normal. There is tenderness (tenderness of the ear on exam, otherwise normal).  Left Ear: Hearing, tympanic membrane, external ear and ear canal normal.  Nose: Mucosal edema (mild) present. Right sinus exhibits maxillary sinus tenderness and frontal sinus tenderness. Left sinus exhibits no maxillary sinus tenderness and no frontal sinus tenderness.  RIGHT side of the face and head exquisitely tender to even very light touch  Eyes: Conjunctivae are normal. No scleral icterus.  Neck: Normal range of motion. Neck supple. No thyromegaly present.  Cardiovascular: Normal rate, regular rhythm and normal heart sounds.   Pulses:      Radial pulses are 2+ on the right side, and 2+ on the left side.  Pulmonary/Chest: Effort normal and breath sounds normal.  Lymphadenopathy:       Head (right side): No tonsillar, no preauricular, no posterior auricular and no occipital adenopathy present.       Head (left side): No tonsillar, no preauricular, no posterior auricular and no occipital adenopathy present.    She has no cervical adenopathy.       Right: No supraclavicular adenopathy present.       Left: No supraclavicular adenopathy present.  Neurological: She is alert and oriented to person, place, and time. No sensory deficit.    Skin: Skin is warm, dry and intact. No rash noted. No cyanosis or erythema. Nails show no clubbing.  Psychiatric: She has a normal mood and affect. Her speech is normal and behavior is normal.    Wt Readings from Last 3 Encounters:  04/02/17 (!) 341 lb 6.4 oz (154.9 kg)  03/13/17 (!) 344 lb 3.2 oz (156.1 kg)  02/09/17 (!) 338 lb (153.3 kg)  10/05/2016 328 lb  Assessment & Plan:   1. Acute sinusitis, recurrence not specified, unspecified location Higher risk for bacterial infection due to HTLV 1. Will eventually need referral to ENT (she's had sinus surgery x 2), but cannot afford, nor does she have the time or energy, to do that right now. Supportive care. - azithromycin (ZITHROMAX) 250 MG tablet; Take 2 tabs PO x 1 dose, then 1 tab PO QD x 4 days  Dispense: 6 tablet; Refill: 0 - azelastine (ASTELIN) 0.1 % nasal spray; Place 2 sprays into both nostrils 2 (two) times daily. Use in each nostril as directed  Dispense: 30 mL; Refill: 0 - predniSONE (DELTASONE) 20 MG tablet; Take 3 PO QAM x3days, 2 PO QAM x3days, 1 PO QAM x3days  Dispense: 18 tablet; Refill: 0  2. Intractable migraine with status migrainosus, unspecified migraine type Likely triggered by sinusitis.    No Follow-up on file.   Fernande Bras, PA-C [facility]

## 2017-04-02 NOTE — Progress Notes (Signed)
Subjective:    Patient ID: Brianna Roberts, female    DOB: 1966-03-08, 51 y.o.   MRN: [id number]  HPI Chief Complaint  Patient presents with  . Sinus Problem    x4 days, pt states her sinuses have a lot pressure and swelling and pt reports a migraine. Pt states she used some ice help with the swelling.   . Ear Pain    Right ear, pt states along with the sinus pressure she is having ear pain   Patient returns today for re-evaluation of mood, fibromyalgia, fatigue and weight.  She states that her depression has improved. She is also down 3.5 pounds since her last visit, she [facility] but is back on their waiting list.   Patient has noted RIGHT sided nasal congestion for the past month.  She reports a 8.5/10 headache that starts in the RIGHT side of her nose that radiates into the periorbital area, RIGHT side of the head, and in the the RIGHT side of the neck. She states the headache started about 4 days ago and has been constant and intensified. She states it feels like "after a boxer gets punched and my face is on fire." She relates RIGHT facial swelling. Patient describes sensitivity to light and sound and wears sunglasses in clinic today. She takes Tramadol and Amitriptyline at night, which did not help with her headache. She uses Flonase and Benadryl every night and that has not helped with her symptoms either. She was not able to sleep last until 8am this morning, usually she sleeps around 4am.  She only notes some mild pain behind the LEFT ear, but most of her complaints are RIGHT sided. 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  Woke up 2 days ago "drenched in sweat, hair was wet, bed was wet."  CPAP machine "not whistling as loud, because not getting very much air in the RIGHT nostril."  She states "I have about an hour of vision left in  my RIGHT eye. I have ocular migraines."   Last eye exam: April 2018  Review of Systems  HENT: Positive for sinus pain and sinus pressure. Negative for rhinorrhea, sore throat and tinnitus.   Eyes: Positive for visual disturbance.  Respiratory: Negative for shortness of breath.   Cardiovascular: Negative for chest pain and palpitations.  Gastrointestinal: Negative for abdominal pain.  Neurological: Positive for dizziness, weakness, light-headedness and headaches. Negative for syncope.   Patient Active Problem List   Diagnosis Date Noted  . Chronic pain syndrome 03/17/2017  . Incomplete emptying of bladder 02/22/2017  . Prediabetes 12/31/2016  . Hypomagnesemia 12/27/2016  . Post concussion syndrome 10/06/2016  . 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 Fibromyalgia 10/05/2016  . Benign essential HTN 10/05/2016  . Hypothyroidism 10/05/2016  . Allergic rhinitis 10/05/2016  . Urticaria 10/05/2016  . Chronic fatigue syndrome 10/05/2016  . Asthma, chronic 10/05/2016  . HSV (herpes simplex virus) anogenital infection 10/05/2016  . Shingles 10/05/2016  . Anxiety and depression 10/05/2016  . Bilateral leg edema 10/05/2016  . Ovarian cyst 10/05/2016  . Nephrolithiasis 10/05/2016  . IBS (irritable bowel syndrome) 10/05/2016  . OSA (obstructive sleep apnea) 10/05/2016  . Left-sided weakness 10/05/2016  . Migraine 10/05/2016  . BMI 45.0-49.9, adult (HCC) 10/05/2016  . DDD (degenerative disc disease), cervical   .  Insomnia   . Right lateral epicondylitis 03/29/2015   Prior to Admission medications   Medication Sig Start Date End Date Taking? Authorizing Provider  acetaminophen (TYLENOL) 500 MG tablet Take 500-1,000 mg by mouth 2 (two) times daily as needed for mild pain.   Yes [provider]  acyclovir (ZOVIRAX) 200 MG capsule Take 1 capsule (200 mg total) by mouth 2 (two) times daily. 10/05/16  Yes Jeffery, Chelle, PA-C  albuterol (PROVENTIL  HFA;VENTOLIN HFA) 108 (90 Base) MCG/ACT inhaler Inhale 1-2 puffs into the lungs every 6 (six) hours as needed for wheezing or shortness of breath. Patient taking differently: Inhale 2 puffs into the lungs every 6 (six) hours as needed for wheezing or shortness of breath.  10/05/16  Yes Jeffery, Chelle, PA-C  amitriptyline (ELAVIL) 75 MG tablet Take 1 tablet (75 mg total) by mouth at bedtime. 12/27/16  Yes Jeffery, Chelle, PA-C  amLODipine (NORVASC) 2.5 MG tablet Take 2.5 mg by mouth daily.  12/28/16  Yes [provider]  ARIPiprazole (ABILIFY) 10 MG tablet Take 1 tablet (10 mg total) by mouth daily. 10/05/16  Yes Jeffery, Chelle, PA-C  budesonide-formoterol (SYMBICORT) 160-4.5 MCG/ACT inhaler Inhale 2 puffs into the lungs 2 (two) times daily. 12/27/16  Yes Jeffery, Chelle, PA-C  diazepam (VALIUM) 5 MG tablet Take 1 tablet (5 mg total) by mouth every 8 (eight) hours as needed for anxiety. 03/28/17  Yes Jeffery, Chelle, PA-C  diphenhydrAMINE (BENADRYL) 25 mg capsule Take 25 mg by mouth 2 (two) times daily.   Yes [provider]  fluticasone (FLONASE) 50 MCG/ACT nasal spray USE 1 SPRAY IN EACH NOSTRIL DAILY. 04/02/17  Yes Jeffery, Chelle, PA-C  furosemide (LASIX) 40 MG tablet Take 1 tablet (40 mg total) by mouth 2 (two) times daily. 10/05/16  Yes Jeffery, Chelle, PA-C  glucose blood test strip Use as instructed 03/28/17  Yes Jeffery, Chelle, PA-C  hydrOXYzine (ATARAX/VISTARIL) 10 MG tablet Take 1 tablet (10 mg total) by mouth at bedtime. Patient taking differently: Take 10 mg by mouth at bedtime as needed for itching (angioedema).  10/05/16  Yes Jeffery, Chelle, PA-C  ipratropium-albuterol (DUONEB) 0.5-2.5 (3) MG/3ML SOLN Take 3 mLs by nebulization every 6 (six) hours as needed (for breathing). 10/05/16  Yes Porfirio Oar, PA-C  Lancets Thin MISC Use to check home glucose daily 03/28/17  Yes Jeffery, Chelle, PA-C  levothyroxine (SYNTHROID, LEVOTHROID) 25 MCG tablet Take 1 tablet (25 mcg total) by  mouth daily before breakfast. Patient taking differently: Take 25 mcg by mouth daily.  10/05/16  Yes Jeffery, Chelle, PA-C  losartan (COZAAR) 100 MG tablet Take 1 tablet (100 mg total) by mouth daily. 10/05/16  Yes Jeffery, Chelle, PA-C  meloxicam (MOBIC) 15 MG tablet Take 1 tablet (15 mg total) by mouth daily. 03/28/17  Yes Jeffery, Chelle, PA-C  methocarbamol (ROBAXIN) 500 MG tablet Take 2 tablets (1,000 mg total) by mouth every 8 (eight) hours as needed for muscle spasms. 02/01/17  Yes Loren Racer, MD  metoprolol (LOPRESSOR) 100 MG tablet Take 1 tablet (100 mg total) by mouth 2 (two) times daily. 10/05/16  Yes Porfirio Oar, PA-C  Nerve Stimulator (PRO COMFORT TENS UNIT) DEVI 1 Device by Does not apply route daily as needed. 12/27/16  Yes Jeffery, Chelle, PA-C  nystatin (MYCOSTATIN/NYSTOP) powder Apply topically 2 (two) times daily. Patient taking differently: Apply 8 g topically 2 (two) times daily.  12/27/16 12/27/17 Yes Jeffery, Chelle, PA-C  nystatin cream (MYCOSTATIN) Apply 1 application topically 2 (two) times daily.   Yes  [provider]  ondansetron (ZOFRAN) 4 MG tablet Take 1 tablet (4 mg total) by mouth every 8 (eight) hours as needed for nausea or vomiting. 02/01/17  Yes Loren Racer, MD  oxyCODONE (OXY IR/ROXICODONE) 5 MG immediate release tablet Take 1 tablet (5 mg total) by mouth every 4 (four) hours as needed for severe pain. 12/27/16  Yes Jeffery, Chelle, PA-C  potassium chloride SA (K-DUR,KLOR-CON) 20 MEQ tablet Take 1 tablet (20 mEq total) by mouth 2 (two) times daily. 02/21/17  Yes Jeffery, Chelle, PA-C  Probiotic Product (ALIGN) 4 MG CAPS Take 4 mg by mouth daily.   Yes [provider]  sertraline (ZOLOFT) 100 MG tablet Take 1 tablet (100 mg total) by mouth daily. 10/05/16  Yes Jeffery, Chelle, PA-C  traMADol (ULTRAM) 50 MG tablet Take 1-2 tablets (50-100 mg total) by mouth every 8 (eight) hours as needed. 03/13/17  Yes Jeffery, Chelle, PA-C  triamcinolone ointment  (KENALOG) 0.5 % Apply 1 application topically 2 (two) times daily.   Yes [provider]  trolamine salicylate (ASPERCREME) 10 % cream Apply 1 application topically at bedtime as needed for muscle pain.   Yes [provider]  azelastine (ASTELIN) 0.1 % nasal spray Place 2 sprays into both nostrils 2 (two) times daily. Use in each nostril as directed 04/02/17   Porfirio Oar, PA-C  azithromycin (ZITHROMAX) 250 MG tablet Take 2 tabs PO x 1 dose, then 1 tab PO QD x 4 days 04/02/17   Porfirio Oar, PA-C  predniSONE (DELTASONE) 20 MG tablet Take 3 PO QAM x3days, 2 PO QAM x3days, 1 PO QAM x3days 04/02/17   Porfirio Oar, PA-C   Allergies  Allergen Reactions  . Sulfa Antibiotics Anaphylaxis  . Iodinated Diagnostic Agents Other (See Comments)    Patient complained of throat/ uvula swelling s/p injection on 02/01/2017 but states she isnt allergic to the dye and has had several times prior   . Codeine Other (See Comments)    Unknown - patient said mother told her she was allergic as a kid  . Dilaudid [Hydromorphone Hcl] Itching  . Other     Other reaction(s): Info Not Available  . Penicillins Other (See Comments)    Has patient had a PCN reaction causing immediate rash, facial/tongue/throat swelling, SOB or lightheadedness with hypotension: Unknown Has patient had a PCN reaction causing severe rash involving mucus membranes or skin necrosis: Unknown Has patient had a PCN reaction that required hospitalization: Unknown Has patient had a PCN reaction occurring within the last 10 years: Unknown If all of the above answers are "NO", then may proceed with Cephalosporin use.  Had a reaction when she was young.   Marland Kitchen Spironolactone Other (See Comments)    "GI Issues"   Social History   Social History  . Marital status: Widowed    Spouse name: Newman Pies  . Number of children: 0  . Years of education: college+   Occupational History  . disability     fibromyalgia    Social History Main Topics  . Smoking status: Current Every Day Smoker    Packs/day: 0.30    Years: 33.00    Types: Cigarettes  . Smokeless tobacco: Never Used  . Alcohol use No  . Drug use: No  . Sexual activity: No   Other Topics Concern  . Not on file   Social History Narrative   Moved to [place], [place] from [place], [place] 04/28/2016 in need of a change.   Her family remains in  [place].   Lives alone.      Objective:   Physical Exam  Constitutional: She is oriented to person, place, and time. She appears well-developed and well-nourished.  HENT:  Head: Normocephalic and atraumatic.  Right Ear: External ear normal.  Left Ear: External ear normal.  Nose: Right sinus exhibits maxillary sinus tenderness and frontal sinus tenderness.  Mouth/Throat: Oropharynx is clear and moist. No oropharyngeal exudate.  Neck: Neck supple. No JVD present. No tracheal deviation present. No thyromegaly present.  Cardiovascular: Normal rate, regular rhythm, normal heart sounds and intact distal pulses.  Exam reveals no gallop and no friction rub.   No murmur heard. Pulmonary/Chest: Effort normal and breath sounds normal. No stridor. No respiratory distress. She has no wheezes. She has no rales. She exhibits no tenderness.  Lymphadenopathy:    She has no cervical adenopathy.  Neurological: She is alert and oriented to person, place, and time.  Skin: Skin is warm and dry. No rash noted. She is not diaphoretic. No erythema. No pallor.      Assessment & Plan:  1. Acute sinusitis, recurrence not specified, unspecified location - azithromycin (ZITHROMAX) 250 MG tablet; Take 2 tabs PO x 1 dose, then 1 tab PO QD x 4 days  Dispense: 6 tablet; Refill: 0 - azelastine (ASTELIN) 0.1 % nasal spray; Place 2 sprays into both nostrils 2 (two) times daily. Use in each nostril as directed  Dispense: 30 mL; Refill: 0 - predniSONE (DELTASONE) 20 MG tablet; Take 3 PO QAM x3days, 2 PO QAM x3days, 1 PO QAM  x3days  Dispense: 18 tablet; Refill: 0  2. Intractable migraine with status migrainosus, unspecified migraine type - The acute RIGHT frontal sinusitis could be the culprit of the RIGHT sided migraine. Patient will notify us if her symptoms persist or worsen.   Respectfully, Gala Romney PA-S 2019

## 2017-04-16 ENCOUNTER — Telehealth: Payer: Self-pay | Admitting: Physician Assistant

## 2017-04-16 ENCOUNTER — Encounter: Payer: Self-pay | Admitting: Physician Assistant

## 2017-04-16 NOTE — Telephone Encounter (Signed)
Order is in. How do we need to get it to [facility]?

## 2017-04-16 NOTE — Telephone Encounter (Signed)
Please advise 

## 2017-04-16 NOTE — Telephone Encounter (Signed)
[employer]  Nurse case manager is calling to see if an order for a tens unit for the patient be sent to [facility]  Phone number is [phone] fax number [phone]

## 2017-04-17 DIAGNOSIS — R69 Illness, unspecified: Secondary | ICD-10-CM | POA: Diagnosis not present

## 2017-04-17 MED ORDER — GLUCOSE BLOOD VI STRP: ORAL_STRIP | 99 refills | Status: DC

## 2017-04-24 ENCOUNTER — Telehealth: Payer: Self-pay | Admitting: Physician Assistant

## 2017-04-24 NOTE — Telephone Encounter (Signed)
Pt is needing to talk with someone about her elevated blood pressure .  The reading yesterday is 161/90 And this morning it was 169/99   Best number [phone]

## 2017-04-26 ENCOUNTER — Telehealth: Payer: Self-pay

## 2017-04-26 DIAGNOSIS — R69 Illness, unspecified: Secondary | ICD-10-CM | POA: Diagnosis not present

## 2017-04-26 NOTE — Telephone Encounter (Signed)
Spoke with Pt and she took her BP while we were on the phone and it was 156/90. Pt states she had a dizzy spell on Friday 04/19/2017 and then again yesterday 04/25/2017. I asked Pt how her fluid and eating habits have been recently and she states she hasn't had much of an appetite and pt states she has increase her physical activities and she hasn't been eating and drinking like she should. Pt states she does NOT have a headache or chest pain. I advised pt to try to eat and drink more because of increased activities and pt thought that would help. I also advised pt if her symptoms got worse to come in. I offered to make a appointment for her today but she denied.

## 2017-04-27 ENCOUNTER — Encounter: Payer: Self-pay | Admitting: Physician Assistant

## 2017-04-27 ENCOUNTER — Ambulatory Visit (INDEPENDENT_AMBULATORY_CARE_PROVIDER_SITE_OTHER): Payer: Medicare HMO | Admitting: Physician Assistant

## 2017-04-27 VITALS — BP 130/100 | HR 88 | Temp 98.3°F | Resp 18 | Ht 69.0 in | Wt 338.4 lb

## 2017-04-27 DIAGNOSIS — G4733 Obstructive sleep apnea (adult) (pediatric): Secondary | ICD-10-CM | POA: Diagnosis not present

## 2017-04-27 DIAGNOSIS — R5382 Chronic fatigue, unspecified: Secondary | ICD-10-CM

## 2017-04-27 DIAGNOSIS — G4700 Insomnia, unspecified: Secondary | ICD-10-CM | POA: Diagnosis not present

## 2017-04-27 DIAGNOSIS — E039 Hypothyroidism, unspecified: Secondary | ICD-10-CM | POA: Diagnosis not present

## 2017-04-27 DIAGNOSIS — R42 Dizziness and giddiness: Secondary | ICD-10-CM | POA: Diagnosis not present

## 2017-04-27 DIAGNOSIS — G43911 Migraine, unspecified, intractable, with status migrainosus: Secondary | ICD-10-CM

## 2017-04-27 DIAGNOSIS — G9332 Myalgic encephalomyelitis/chronic fatigue syndrome: Secondary | ICD-10-CM

## 2017-04-27 DIAGNOSIS — R531 Weakness: Secondary | ICD-10-CM | POA: Diagnosis not present

## 2017-04-27 DIAGNOSIS — R413 Other amnesia: Secondary | ICD-10-CM

## 2017-04-27 DIAGNOSIS — I10 Essential (primary) hypertension: Secondary | ICD-10-CM | POA: Diagnosis not present

## 2017-04-27 DIAGNOSIS — R29818 Other symptoms and signs involving the nervous system: Secondary | ICD-10-CM

## 2017-04-27 DIAGNOSIS — R7303 Prediabetes: Secondary | ICD-10-CM | POA: Diagnosis not present

## 2017-04-27 DIAGNOSIS — R600 Localized edema: Secondary | ICD-10-CM | POA: Diagnosis not present

## 2017-04-27 DIAGNOSIS — R400 Somnolence: Secondary | ICD-10-CM

## 2017-04-27 DIAGNOSIS — K580 Irritable bowel syndrome with diarrhea: Secondary | ICD-10-CM | POA: Diagnosis not present

## 2017-04-27 MED ORDER — AMLODIPINE BESYLATE 5 MG PO TABS: 5.0000 mg | ORAL_TABLET | Freq: Every day | ORAL | 1 refills | Status: DC

## 2017-04-27 NOTE — Progress Notes (Signed)
Patient ID: Brianna Roberts, female    DOB: [birth year], 51 y.o.   MRN: [id number]  PCP: Porfirio Oar, PA-C  Chief Complaint  Patient presents with  . Hypertension    Pt states she was getting elevated BP over the past week.  . Dizziness    Pt states she has been having dizzy spells and near fainting spells. Pt states she does not feel dizzy today but has a headache.    Subjective:   Presents for evaluation of elevated BP and dizziness.  She has HTN, and has been controlled with amlodipine 2.5 mg daily, furosemide 40 mg BID, losartan 100 mg daily, metoprolol tartrate 100 mg BID, though she reports periodic "spikes."  She reports that she generally likes to keep a check on her BP and realized on 8/17 that she hadn't checked in a while. When she did, it was elevated, and since then she has continued to note elevated readings at home: 133-169/79-99.   THen on 10/23 she stood to go somewhere and felt overwhelming generalized weakness and fatigue, such that she had to use a walker. She developed dizziness and thinks that she "balacked out" because she was talking to someone and then all of a sudden realized that the woman had moved much closer to her and she had not been aware of her movement. The woman did not tell her that she moved closer for any reason (as in to help the patient, or because she noticed something seemed "off" about her). She describes the dizziness as the feeling that her head is spinning on the inside. One pre-syncopal episode 3 days ago, occurred with standing up.   The fatigue is such that she sometimes falls asleep when she is sitting at the dinner table. She is more forgetful, feels like she is losing track of time. Feels like sometimes she lurches when walking.  Also having a flare of IBS presently. Believes that the RIGHT shoulder pain is improving and that her hip pain is improving.  She has been exercising in a local pool, just walking. Finds that she  sleeps much better with this exercise and is so happy to be doing it. She does note some increased LE edema after being in the pool.  She has been referred to neurology, and her appointment is 11/09. In addition to the weakness and dizziness, she has longstanding headaches and OSA. Migraine headaches associated with photophobia, but no phonophobia, nausea or vomiting. Her mother had an unusual brain tumor (reportedly very rare and case used for educational purposes at [employer]).  She has also scheduled with urology, but doesn't recall when that is. It's in her calendar. She has not scheduled with rheumatology, orthopedics, GI, GYN, ENT, ID-all requested when she first moved to this area and established here. She previously saw all these specialists in [place].     Review of Systems As above.    Patient Active Problem List   Diagnosis Date Noted  . Chronic pain syndrome 03/17/2017  . Incomplete emptying of bladder 02/22/2017  . Prediabetes 12/31/2016  . Hypomagnesemia 12/27/2016  . Post concussion syndrome 10/06/2016  . 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 Fibromyalgia 10/05/2016  . Benign essential HTN 10/05/2016  . Hypothyroidism 10/05/2016  . Allergic rhinitis 10/05/2016  . Urticaria 10/05/2016  . Chronic fatigue syndrome 10/05/2016  . Asthma, chronic 10/05/2016  . HSV (herpes simplex virus) anogenital infection 10/05/2016  . Shingles 10/05/2016  . Anxiety and depression  10/05/2016  . Bilateral leg edema 10/05/2016  . Ovarian cyst 10/05/2016  . Nephrolithiasis 10/05/2016  . IBS (irritable bowel syndrome) 10/05/2016  . OSA (obstructive sleep apnea) 10/05/2016  . Left-sided weakness 10/05/2016  . Migraine 10/05/2016  . BMI 45.0-49.9, adult (HCC) 10/05/2016  . DDD (degenerative disc disease), cervical   . Insomnia   . Right lateral epicondylitis 03/29/2015     Prior to Admission medications   Medication Sig Start Date End Date  Taking? Authorizing Provider  acyclovir (ZOVIRAX) 200 MG capsule Take 1 capsule (200 mg total) by mouth 2 (two) times daily. 10/05/16  Yes Garmon Dehn, PA-C  albuterol (PROVENTIL HFA;VENTOLIN HFA) 108 (90 Base) MCG/ACT inhaler Inhale 1-2 puffs into the lungs every 6 (six) hours as needed for wheezing or shortness of breath. Patient taking differently: Inhale 2 puffs into the lungs every 6 (six) hours as needed for wheezing or shortness of breath.  10/05/16  Yes Vlasta Baskin, PA-C  amitriptyline (ELAVIL) 75 MG tablet Take 1 tablet (75 mg total) by mouth at bedtime. 12/27/16  Yes Jaeleigh Monaco, PA-C  amLODipine (NORVASC) 2.5 MG tablet Take 2.5 mg by mouth daily.  12/28/16  Yes [provider]  azelastine (ASTELIN) 0.1 % nasal spray Place 2 sprays into both nostrils 2 (two) times daily. Use in each nostril as directed 04/02/17  Yes Sussie Minor, PA-C  budesonide-formoterol (SYMBICORT) 160-4.5 MCG/ACT inhaler Inhale 2 puffs into the lungs 2 (two) times daily. 12/27/16  Yes Cleland Simkins, PA-C  diazepam (VALIUM) 5 MG tablet Take 1 tablet (5 mg total) by mouth every 8 (eight) hours as needed for anxiety. 03/28/17  Yes Brock Mokry, PA-C  diphenhydrAMINE (BENADRYL) 25 mg capsule Take 25 mg by mouth 2 (two) times daily.   Yes [provider]  fluticasone (FLONASE) 50 MCG/ACT nasal spray USE 1 SPRAY IN EACH NOSTRIL DAILY. 04/02/17  Yes Leocadia Idleman, PA-C  furosemide (LASIX) 40 MG tablet Take 1 tablet (40 mg total) by mouth 2 (two) times daily. 10/05/16  Yes Oluwademilade Mckiver, PA-C  glucose blood test strip Use as instructed 04/17/17  Yes Jariya Reichow, PA-C  hydrOXYzine (ATARAX/VISTARIL) 10 MG tablet Take 1 tablet (10 mg total) by mouth at bedtime. Patient taking differently: Take 10 mg by mouth at bedtime as needed for itching (angioedema).  10/05/16  Yes Skyler Carel, PA-C  ipratropium-albuterol (DUONEB) 0.5-2.5 (3) MG/3ML SOLN Take 3 mLs by nebulization every 6 (six) hours as  needed (for breathing). 10/05/16  Yes Porfirio Oar, PA-C  Lancets Thin MISC Use to check home glucose daily 03/28/17  Yes Mescal Flinchbaugh, PA-C  levothyroxine (SYNTHROID, LEVOTHROID) 25 MCG tablet Take 1 tablet (25 mcg total) by mouth daily before breakfast. Patient taking differently: Take 25 mcg by mouth daily.  10/05/16  Yes Murrell Dome, PA-C  losartan (COZAAR) 100 MG tablet Take 1 tablet (100 mg total) by mouth daily. 10/05/16  Yes Raif Chachere, PA-C  meloxicam (MOBIC) 15 MG tablet Take 1 tablet (15 mg total) by mouth daily. 03/28/17  Yes Banesa Tristan, PA-C  methocarbamol (ROBAXIN) 500 MG tablet Take 2 tablets (1,000 mg total) by mouth every 8 (eight) hours as needed for muscle spasms. 02/01/17  Yes Loren Racer, MD  metoprolol (LOPRESSOR) 100 MG tablet Take 1 tablet (100 mg total) by mouth 2 (two) times daily. 10/05/16  Yes Porfirio Oar, PA-C  Nerve Stimulator (PRO COMFORT TENS UNIT) DEVI 1 Device by Does not apply route daily as needed. 12/27/16  Yes Oceana Walthall, PA-C  nystatin (MYCOSTATIN/NYSTOP)  powder Apply topically 2 (two) times daily. Patient taking differently: Apply 8 g topically 2 (two) times daily.  12/27/16 12/27/17 Yes Charmian Forbis, PA-C  nystatin cream (MYCOSTATIN) Apply 1 application topically 2 (two) times daily.   Yes [provider]  oxyCODONE (OXY IR/ROXICODONE) 5 MG immediate release tablet Take 1 tablet (5 mg total) by mouth every 4 (four) hours as needed for severe pain. 12/27/16  Yes Silva Aamodt, PA-C  potassium chloride SA (K-DUR,KLOR-CON) 20 MEQ tablet Take 1 tablet (20 mEq total) by mouth 2 (two) times daily. 02/21/17  Yes Nole Robey, PA-C  sertraline (ZOLOFT) 100 MG tablet Take 1 tablet (100 mg total) by mouth daily. 10/05/16  Yes Naira Standiford, PA-C  traMADol (ULTRAM) 50 MG tablet Take 1-2 tablets (50-100 mg total) by mouth every 8 (eight) hours as needed. 03/13/17  Yes Avin Upperman, PA-C  triamcinolone ointment (KENALOG) 0.5 % Apply 1  application topically 2 (two) times daily.   Yes [provider]  trolamine salicylate (ASPERCREME) 10 % cream Apply 1 application topically at bedtime as needed for muscle pain.   Yes [provider]  acetaminophen (TYLENOL) 500 MG tablet Take 500-1,000 mg by mouth 2 (two) times daily as needed for mild pain.    [provider]  ARIPiprazole (ABILIFY) 10 MG tablet Take 1 tablet (10 mg total) by mouth daily. Patient not taking: Reported on 04/27/2017 10/05/16   Porfirio Oar, PA-C  ondansetron (ZOFRAN) 4 MG tablet Take 1 tablet (4 mg total) by mouth every 8 (eight) hours as needed for nausea or vomiting. Patient not taking: Reported on 04/27/2017 02/01/17   Loren Racer, MD  Probiotic Product (ALIGN) 4 MG CAPS Take 4 mg by mouth daily.    [provider]     Allergies  Allergen Reactions  . Sulfa Antibiotics Anaphylaxis  . Iodinated Diagnostic Agents Other (See Comments)    Patient complained of throat/ uvula swelling s/p injection on 02/01/2017 but states she isnt allergic to the dye and has had several times prior   . Codeine Other (See Comments)    Unknown - patient said mother told her she was allergic as a kid  . Dilaudid [Hydromorphone Hcl] Itching  . Other     Other reaction(s): Info Not Available  . Penicillins Other (See Comments)    Has patient had a PCN reaction causing immediate rash, facial/tongue/throat swelling, SOB or lightheadedness with hypotension: Unknown Has patient had a PCN reaction causing severe rash involving mucus membranes or skin necrosis: Unknown Has patient had a PCN reaction that required hospitalization: Unknown Has patient had a PCN reaction occurring within the last 10 years: Unknown If all of the above answers are "NO", then may proceed with Cephalosporin use.  Had a reaction when she was young.   Marland Kitchen Spironolactone Other (See Comments)    "GI Issues"       Objective:  Physical Exam  Constitutional: She is  oriented to person, place, and time. She appears well-developed and well-nourished. She is active and cooperative. No distress.  BP (!) 130/100 (BP Location: Left Arm, Patient Position: Sitting, Cuff Size: Large)   Pulse 88   Temp 98.3 F (36.8 C) (Oral)   Resp 18   Ht 5\' 9"  (1.753 m)   Wt (!) 338 lb 6.4 oz (153.5 kg)   SpO2 97%   BMI 49.97 kg/m   HENT:  Head: Normocephalic and atraumatic.  Right Ear: Hearing normal.  Left Ear: Hearing normal.  Eyes: Conjunctivae are  normal. No scleral icterus.  Neck: Normal range of motion. Neck supple. No thyromegaly present.  Cardiovascular: Normal rate, regular rhythm and normal heart sounds.  Pulses:      Radial pulses are 2+ on the right side, and 2+ on the left side.  No LE Edema noted on exam  Pulmonary/Chest: Effort normal and breath sounds normal.  Musculoskeletal:       Right shoulder: She exhibits decreased range of motion and tenderness (but less than previous exams).  Lymphadenopathy:       Head (right side): No tonsillar, no preauricular, no posterior auricular and no occipital adenopathy present.       Head (left side): No tonsillar, no preauricular, no posterior auricular and no occipital adenopathy present.    She has no cervical adenopathy.       Right: No supraclavicular adenopathy present.       Left: No supraclavicular adenopathy present.  Neurological: She is alert and oriented to person, place, and time. No sensory deficit.  Skin: Skin is warm, dry and intact. No rash noted. No cyanosis or erythema. Nails show no clubbing.  Psychiatric: She has a normal mood and affect. Her speech is normal and behavior is normal. She exhibits abnormal recent memory (by report).  Demeanor with student PA was quite depressed and thoughts scattered. With me, her presentation was optimistic and positive, pointing out things that were improved since her last visit.     Wt Readings from Last 3 Encounters:  04/27/17 (!) 338 lb 6.4 oz (153.5 kg)   04/02/17 (!) 341 lb 6.4 oz (154.9 kg)  03/13/17 (!) 344 lb 3.2 oz (156.1 kg)    While walking in the hallway after her visit, she reported that she had one of the lurching episodes and almost ran in to me. This was not observed by myself, so the PA student walking behind [place].    Assessment & Plan:   Problem List Items Addressed This Visit    Benign essential HTN - Primary    Elevated readings x 2 weeks per patient's documentation, and here today. Increase amlodipine from 2.5 mg to 5 mg. Monitor for increased LE edema.      Relevant Medications   amLODipine (NORVASC) 5 MG tablet   Other Relevant Orders   CBC with Differential/Platelet   Comprehensive metabolic panel (Completed)   TSH   T4, free   Hypothyroidism    Has been stable, but update due to substantial increase in fatigue and weakness.      Chronic fatigue syndrome    Update labs. Pursue CPAP compliance. Continue exercise.      Bilateral leg edema    Not present on exam today. Reportedly worse after water exercise. Continue efforts for weight loss. COntinue furosemide. COntinue efforts to control HTN, though increased amlodipine may aggravate this.      IBS (irritable bowel syndrome)    Recommend that she proceed with GI referral made in 09/2016.      OSA (obstructive sleep apnea)    Stressed the importance of regular CPAP use and possible need for retitration.      Migraine    Proceed with neurology visit next week as planned. Untreated OSA is a likely trigger.      Relevant Medications   amLODipine (NORVASC) 5 MG tablet   Other Relevant Orders   MR Brain W Wo Contrast   Insomnia    Patient reports this is currently not an issue, since she started water exercise.  Prediabetes    Update A1C. Continue efforts for healthy lifestyle changes.      Relevant Orders   Hemoglobin A1c    Other Visit Diagnoses    Dizziness       Relevant Orders   MR Brain W Wo Contrast   CBC with Differential/Platelet     Comprehensive metabolic panel (Completed)   Hemoglobin A1c   TSH   T4, free   Weakness generalized       Relevant Orders   MR Brain W Wo Contrast   Comprehensive metabolic panel (Completed)   TSH   Vitamin B12   Memory loss       Relevant Orders   MR Brain W Wo Contrast   Other symptoms and signs involving the nervous system       Relevant Orders   MR Brain W Wo Contrast   Somnolence           Return in about 2 weeks (around 05/11/2017) for re-evaluation of blood pressure.   Fernande Brashelle S. Matej Sappenfield, PA-C [facility]

## 2017-04-27 NOTE — Progress Notes (Signed)
Subjective:    Patient ID: Brianna Roberts, female    DOB: [birth year], 51 y.o.   MRN: [id number]  HPI  Brianna Roberts is a 51 year old African American female with a past medical history significant for hypertension, hypothyroidism, chronic fatigue syndrome, hypothyroidism, anxiety, depression, obstructive sleep apnea, and insomnia who presents today with dizziness and elevated blood pressure.   Brianna Roberts states on 02/08/17 she realized she had not checked her blood pressure in awhile and realized when she checked it that it was elevated. She tried to then make it a point the check it regularly and it continued to be elevated. Her blood pressure ranged from 133-169/79-99. Brianna Roberts reports she is compliant with her blood pressure medications. She states she has some side effects with her medications, but they are tolerable for right now.   Patient reports on 04/16/17 she stood up to go somewhere and she felt really weak. She states she had to use her walker. While she was out she started sweating and developed leg weakness and dizziness. She states while she was standing talking to someone her believes she had to have "blacked out" because the woman moved from one spot to another without Brianna Roberts realizing it. She describes her dizziness as her head is spinning on the inside.   Brianna Roberts recently started swimming and thought she may have been overexerting herself. She states after she went home she was unable to sleep for 24 hours. After 24-25 hours she felt fine and was able to go back to her normal activities. She states in the pool she is just walking and when she walks in the pool she comes out and has pitting edema in her lower extremities.   Brianna Roberts states 3 days ago she was once again getting up to do something and she felt like she was going to faint again.  Brianna Roberts also states she is having episodes of exhaustion. She states sometimes she is so tired that she could fall asleep at the dinner  table. Brianna Roberts reports she has had a decreased appetite for about 3 weeks. She states for about 1 week the only thing she was drinking was ginger ale. She also reports she has dry mouth and increased thirst. Brianna Roberts also states her IBS with diarrhea is flaring right now.  Brianna Roberts also presents with a migraine. She reports it started last night and she went to bed. She denies nausea or vomiting. She endorses photophobia but denies phonophobia.   Of note, Brianna Roberts reports her mother had a brain tumor and her case was so rare she was used for education purposes at Brianna Roberts.    Review of Systems Negative other than stated above.   Medications:  Prior to Admission medications   Medication Sig Start Date End Date Taking? Authorizing Provider  acyclovir (ZOVIRAX) 200 MG capsule Take 1 capsule (200 mg total) by mouth 2 (two) times daily. 10/05/16  Yes Jeffery, Chelle, PA-C  albuterol (PROVENTIL HFA;VENTOLIN HFA) 108 (90 Base) MCG/ACT inhaler Inhale 1-2 puffs into the lungs every 6 (six) hours as needed for wheezing or shortness of breath. Patient taking differently: Inhale 2 puffs into the lungs every 6 (six) hours as needed for wheezing or shortness of breath.  10/05/16  Yes Jeffery, Chelle, PA-C  amitriptyline (ELAVIL) 75 MG tablet Take 1 tablet (75 mg total) by mouth at bedtime. 12/27/16  Yes Jeffery, Chelle, PA-C  amLODipine (NORVASC) 5 MG tablet Take 1 tablet (  5 mg total) by mouth daily. 04/27/17  Yes Jeffery, Chelle, PA-C  azelastine (ASTELIN) 0.1 % nasal spray Place 2 sprays into both nostrils 2 (two) times daily. Use in each nostril as directed 04/02/17  Yes Jeffery, Chelle, PA-C  budesonide-formoterol (SYMBICORT) 160-4.5 MCG/ACT inhaler Inhale 2 puffs into the lungs 2 (two) times daily. 12/27/16  Yes Jeffery, Chelle, PA-C  diazepam (VALIUM) 5 MG tablet Take 1 tablet (5 mg total) by mouth every 8 (eight) hours as needed for anxiety. 03/28/17  Yes Jeffery, Chelle, PA-C  diphenhydrAMINE  (BENADRYL) 25 mg capsule Take 25 mg by mouth 2 (two) times daily.   Yes [provider]  fluticasone (FLONASE) 50 MCG/ACT nasal spray USE 1 SPRAY IN EACH NOSTRIL DAILY. 04/02/17  Yes Jeffery, Chelle, PA-C  furosemide (LASIX) 40 MG tablet Take 1 tablet (40 mg total) by mouth 2 (two) times daily. 10/05/16  Yes Jeffery, Chelle, PA-C  glucose blood test strip Use as instructed 04/17/17  Yes Jeffery, Chelle, PA-C  hydrOXYzine (ATARAX/VISTARIL) 10 MG tablet Take 1 tablet (10 mg total) by mouth at bedtime. Patient taking differently: Take 10 mg by mouth at bedtime as needed for itching (angioedema).  10/05/16  Yes Jeffery, Chelle, PA-C  ipratropium-albuterol (DUONEB) 0.5-2.5 (3) MG/3ML SOLN Take 3 mLs by nebulization every 6 (six) hours as needed (for breathing). 10/05/16  Yes Porfirio Oar, PA-C  Lancets Thin MISC Use to check home glucose daily 03/28/17  Yes Jeffery, Chelle, PA-C  levothyroxine (SYNTHROID, LEVOTHROID) 25 MCG tablet Take 1 tablet (25 mcg total) by mouth daily before breakfast. Patient taking differently: Take 25 mcg by mouth daily.  10/05/16  Yes Jeffery, Chelle, PA-C  losartan (COZAAR) 100 MG tablet Take 1 tablet (100 mg total) by mouth daily. 10/05/16  Yes Jeffery, Chelle, PA-C  meloxicam (MOBIC) 15 MG tablet Take 1 tablet (15 mg total) by mouth daily. 03/28/17  Yes Jeffery, Chelle, PA-C  methocarbamol (ROBAXIN) 500 MG tablet Take 2 tablets (1,000 mg total) by mouth every 8 (eight) hours as needed for muscle spasms. 02/01/17  Yes Loren Racer, MD  metoprolol (LOPRESSOR) 100 MG tablet Take 1 tablet (100 mg total) by mouth 2 (two) times daily. 10/05/16  Yes Porfirio Oar, PA-C  Nerve Stimulator (PRO COMFORT TENS UNIT) DEVI 1 Device by Does not apply route daily as needed. 12/27/16  Yes Jeffery, Chelle, PA-C  nystatin (MYCOSTATIN/NYSTOP) powder Apply topically 2 (two) times daily. Patient taking differently: Apply 8 g topically 2 (two) times daily.  12/27/16 12/27/17 Yes Jeffery, Chelle,  PA-C  nystatin cream (MYCOSTATIN) Apply 1 application topically 2 (two) times daily.   Yes [provider]  oxyCODONE (OXY IR/ROXICODONE) 5 MG immediate release tablet Take 1 tablet (5 mg total) by mouth every 4 (four) hours as needed for severe pain. 12/27/16  Yes Jeffery, Chelle, PA-C  potassium chloride SA (K-DUR,KLOR-CON) 20 MEQ tablet Take 1 tablet (20 mEq total) by mouth 2 (two) times daily. 02/21/17  Yes Jeffery, Chelle, PA-C  sertraline (ZOLOFT) 100 MG tablet Take 1 tablet (100 mg total) by mouth daily. 10/05/16  Yes Jeffery, Chelle, PA-C  traMADol (ULTRAM) 50 MG tablet Take 1-2 tablets (50-100 mg total) by mouth every 8 (eight) hours as needed. 03/13/17  Yes Jeffery, Chelle, PA-C  triamcinolone ointment (KENALOG) 0.5 % Apply 1 application topically 2 (two) times daily.   Yes [provider]  trolamine salicylate (ASPERCREME) 10 % cream Apply 1 application topically at bedtime as needed for muscle pain.   Yes [provider]  acetaminophen (TYLENOL) 500 MG tablet Take 500-1,000 mg by mouth 2 (two) times daily as needed for mild pain.    [provider]  ARIPiprazole (ABILIFY) 10 MG tablet Take 1 tablet (10 mg total) by mouth daily. Patient not taking: Reported on 04/27/2017 10/05/16   Porfirio Oar, PA-C  ondansetron (ZOFRAN) 4 MG tablet Take 1 tablet (4 mg total) by mouth every 8 (eight) hours as needed for nausea or vomiting. Patient not taking: Reported on 04/27/2017 02/01/17   Loren Racer, MD  Probiotic Product (ALIGN) 4 MG CAPS Take 4 mg by mouth daily.    [provider]    Allergies:  Allergies  Allergen Reactions  . Sulfa Antibiotics Anaphylaxis  . Iodinated Diagnostic Agents Other (See Comments)    Patient complained of throat/ uvula swelling s/p injection on 02/01/2017 but states she isnt allergic to the dye and has had several times prior   . Codeine Other (See Comments)    Unknown - patient said mother told her she was allergic as a  kid  . Dilaudid [Hydromorphone Hcl] Itching  . Other     Other reaction(s): Info Not Available  . Penicillins Other (See Comments)    Has patient had a PCN reaction causing immediate rash, facial/tongue/throat swelling, SOB or lightheadedness with hypotension: Unknown Has patient had a PCN reaction causing severe rash involving mucus membranes or skin necrosis: Unknown Has patient had a PCN reaction that required hospitalization: Unknown Has patient had a PCN reaction occurring within the last 10 years: Unknown If all of the above answers are "NO", then may proceed with Cephalosporin use.  Had a reaction when she was young.   Marland Kitchen Spironolactone Other (See Comments)    "GI Issues"   Chronic Medical Conditions:  Patient Active Problem List   Diagnosis Date Noted  . Chronic pain syndrome 03/17/2017  . Incomplete emptying of bladder 02/22/2017  . Prediabetes 12/31/2016  . Hypomagnesemia 12/27/2016  . Post concussion syndrome 10/06/2016  . 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 Fibromyalgia 10/05/2016  . Benign essential HTN 10/05/2016  . Hypothyroidism 10/05/2016  . Allergic rhinitis 10/05/2016  . Urticaria 10/05/2016  . Chronic fatigue syndrome 10/05/2016  . Asthma, chronic 10/05/2016  . HSV (herpes simplex virus) anogenital infection 10/05/2016  . Shingles 10/05/2016  . Anxiety and depression 10/05/2016  . Bilateral leg edema 10/05/2016  . Ovarian cyst 10/05/2016  . Nephrolithiasis 10/05/2016  . IBS (irritable bowel syndrome) 10/05/2016  . OSA (obstructive sleep apnea) 10/05/2016  . Left-sided weakness 10/05/2016  . Migraine 10/05/2016  . BMI 45.0-49.9, adult (HCC) 10/05/2016  . DDD (degenerative disc disease), cervical   . Insomnia   . Right lateral epicondylitis 03/29/2015      Objective:   Physical Exam  Constitutional: She appears well-developed and well-nourished. She is active and cooperative. No distress.  BP (!) 130/100 (BP Location:  Left Arm, Patient Position: Sitting, Cuff Size: Large)   Pulse 88   Temp 98.3 F (36.8 C) (Oral)   Resp 18   Ht 5\' 9"  (1.753 m)   Wt (!) 338 lb 6.4 oz (153.5 kg)   SpO2 97%   BMI 49.97 kg/m    HENT:  Head: Normocephalic and atraumatic.  Eyes: Pupils are equal, round, and reactive to light. Conjunctivae are normal.  Neck: Normal range of motion. Neck supple. No thyromegaly present.  Cardiovascular: Normal rate, regular rhythm, S1 normal, S2 normal, normal heart sounds and intact distal pulses.   No  murmur heard. Pulses:      Radial pulses are 2+ on the right side, and 2+ on the left side.  No edema in lower extremities  Pulmonary/Chest: Effort normal and breath sounds normal. She has no wheezes.  Musculoskeletal:       Right shoulder: She exhibits decreased range of motion and tenderness.       Left shoulder: She exhibits normal range of motion and no tenderness.  Lymphadenopathy:    She has no cervical adenopathy.  Neurological: She is alert. She has normal strength. She displays no tremor. She exhibits normal muscle tone. She displays no seizure activity. Coordination and gait normal.  Skin: Skin is warm and dry.  Psychiatric:  Patient had difficulty recalling dates and timing of events      Assessment & Plan:  1. Benign essential HTN - Increase Amlodipine to 5mg  daily  - Continue Furosemide 40mg  BID  - Continue Losartan 100mg  daily  - Continue Metoprolol 100mg  BID  - Encouraged patient to attempt to eat well-balanced meals that include fruits and vegetables, whole grains, lean proteins, and low sodium foods. Also encouraged patient to get at least 30 minutes of exercise 5 times per week.  - Also encouraged patient to check blood pressure at home.  - Return to clinic in 2 weeks for hypertension follow-up.  2. Dizziness - CBC with Differential/Platelet obtained today in clinic - Comprehensive metabolic panel obtained today in clinic - MR Brain with & without Contrast  order placed today in clinic - Encouraged patient to continue water aerobics, but to not over-exert herself  - Patient to return to clinic if dizziness does not improve  3. Weakness generalized - MR Brain with & without Contrast order placed today in clinic   4. Intractable migraine with status migrainosus, unspecified migraine type - MR Brain with & without Contrast order placed today in clinic  5. OSA (obstructive sleep apnea) - Instructed patient to schedule appointment with sleep medicine  - Educated patient on the importance of getting adequate, restorative sleep. Informed patient that some of her symptoms may improve if she gets restorative sleep.   6. Irritable bowel syndrome with diarrhea - Instructed patient to schedule appointment with gastroenterology - Encouraged patient to attempt to eat well-balanced meals that include fruits and vegetables, whole grains, lean proteins, and low sodium foods  7. Hypothyroidism, unspecified type - TSH & Free T4 obtained today in clinic - Continue Levothyroxine daily  8. Chronic fatigue syndrome - Instructed patient to schedule appointment with sleep medicine  - Educated patient on the importance of getting adequate, restorative sleep. Informed patient that some of her symptoms may improve if she gets restorative sleep.   10. Insomnia, unspecified type - Instructed patient to schedule appointment with sleep medicine  - Educated patient on the importance of getting adequate, restorative sleep. Informed patient that some of her symptoms may improve if she gets restorative sleep.   11. Prediabetes - Hemoglobin A1c obtained today in clinic  - Encouraged patient to attempt to eat well-balanced meals that include fruits and vegetables, whole grains, lean proteins, and low sodium foods. Also encouraged patient to get at least 30 minutes of exercise 5 times per week.  - Return to clinic in 3 months for pre-diabetes follow-up.  12. Memory  loss - MR Brain with & without Contrast order placed today in clinic - Educated patient on the importance of getting adequate, restorative sleep. Informed patient that some of her symptoms may improve if  she gets restorative sleep.   13. Other symptoms and signs involving the nervous system - MR Brain with & without Contrast order placed today in clinic  14. Somnolence - Vitamin B12 level obtained today in clinic  - Educated patient on the importance of getting adequate, restorative sleep. Informed patient that some of her symptoms may improve if she gets restorative sleep.  Judie Petit, PA-S

## 2017-04-27 NOTE — Patient Instructions (Addendum)
We are going to address the blood pressure and dizziness today. I believe that the dizziness is likely caused by the elevated blood pressure.  It is extremely important that you proceed with the following specialty evaluations for which you have previously been referred: 1. Neurology 2. Sleep medicine 3. Gastroenterology  I think that you would benefit significantly from Medical Weight Management at [facility].  Please continue water exercise. Even just walking in the water is an excellent resistance exercise! It will get easier, and you will be able to push yourself more, but for now, just doing gentle, slow movements is great!    IF you received an x-ray today, [facility] will [facility] an invoice [facility]. Please contact [facility] at [phone] with questions or concerns regarding your invoice.   IF you received labwork today, you will receive an invoice from [facility]. Please contact [facility] at [phone] with questions or concerns regarding your invoice.   Our billing staff will not be able to assist you with questions regarding bills from these companies.  You will be contacted with the lab results as soon as they are available. The fastest way to get your results is to activate your My Chart account. Instructions are located on the last page of this paperwork. If you have not heard from us regarding the results in 2 weeks, please contact this office.

## 2017-04-28 ENCOUNTER — Encounter: Payer: Self-pay | Admitting: Physician Assistant

## 2017-04-28 NOTE — Assessment & Plan Note (Signed)
Has been stable, but update due to substantial increase in fatigue and weakness.

## 2017-04-28 NOTE — Assessment & Plan Note (Signed)
Proceed with neurology visit next week as planned. Untreated OSA is a likely trigger.

## 2017-04-28 NOTE — Assessment & Plan Note (Signed)
Recommend that she proceed with GI referral made in 09/2016.

## 2017-04-28 NOTE — Assessment & Plan Note (Signed)
Patient reports this is currently not an issue, since she started water exercise.

## 2017-04-28 NOTE — Assessment & Plan Note (Signed)
Stressed the importance of regular CPAP use and possible need for retitration.

## 2017-04-28 NOTE — Assessment & Plan Note (Signed)
Not present on exam today. Reportedly worse after water exercise. Continue efforts for weight loss. COntinue furosemide. COntinue efforts to control HTN, though increased amlodipine may aggravate this.

## 2017-04-28 NOTE — Assessment & Plan Note (Signed)
Elevated readings x 2 weeks per patient's documentation, and here today. Increase amlodipine from 2.5 mg to 5 mg. Monitor for increased LE edema.

## 2017-04-28 NOTE — Assessment & Plan Note (Signed)
Update labs. Pursue CPAP compliance. Continue exercise.

## 2017-04-28 NOTE — Assessment & Plan Note (Signed)
Update A1C. Continue efforts for healthy lifestyle changes.

## 2017-04-30 LAB — CBC WITH DIFFERENTIAL/PLATELET
BASOS ABS: 63 {cells}/uL (ref 0–200)
Basophils Relative: 0.8 %
EOS ABS: 158 {cells}/uL (ref 15–500)
Eosinophils Relative: 2 %
HEMATOCRIT: 39.9 % (ref 35.0–45.0)
Hemoglobin: 13 g/dL (ref 11.7–15.5)
Lymphs Abs: 3523 cells/uL (ref 850–3900)
MCH: 26.9 pg — ABNORMAL LOW (ref 27.0–33.0)
MCHC: 32.6 g/dL (ref 32.0–36.0)
MCV: 82.4 fL (ref 80.0–100.0)
MPV: 9.4 fL (ref 7.5–12.5)
Monocytes Relative: 5.7 %
NEUTROS PCT: 46.9 %
Neutro Abs: 3705 cells/uL (ref 1500–7800)
Platelets: 358 10*3/uL (ref 140–400)
RBC: 4.84 10*6/uL (ref 3.80–5.10)
RDW: 13 % (ref 11.0–15.0)
TOTAL LYMPHOCYTE: 44.6 %
WBC: 7.9 10*3/uL (ref 3.8–10.8)
WBCMIX: 450 {cells}/uL (ref 200–950)

## 2017-04-30 LAB — COMPREHENSIVE METABOLIC PANEL
AG Ratio: 1.5 (calc) (ref 1.0–2.5)
ALBUMIN MSPROF: 4.1 g/dL (ref 3.6–5.1)
ALKALINE PHOSPHATASE (APISO): 74 U/L (ref 33–130)
ALT: 15 U/L (ref 6–29)
AST: 14 U/L (ref 10–35)
BILIRUBIN TOTAL: 0.6 mg/dL (ref 0.2–1.2)
BUN: 8 mg/dL (ref 7–25)
CALCIUM: 9.1 mg/dL (ref 8.6–10.4)
CO2: 28 mmol/L (ref 20–32)
Chloride: 105 mmol/L (ref 98–110)
Creat: 0.87 mg/dL (ref 0.50–1.05)
Globulin: 2.7 g/dL (calc) (ref 1.9–3.7)
Glucose, Bld: 90 mg/dL (ref 65–99)
POTASSIUM: 3.9 mmol/L (ref 3.5–5.3)
Sodium: 140 mmol/L (ref 135–146)
Total Protein: 6.8 g/dL (ref 6.1–8.1)

## 2017-04-30 LAB — VITAMIN B12: VITAMIN B 12: 224 pg/mL (ref 200–1100)

## 2017-04-30 LAB — TSH: TSH: 2.32 mIU/L

## 2017-04-30 LAB — HEMOGLOBIN A1C
Hgb A1c MFr Bld: 5.9 % of total Hgb — ABNORMAL HIGH (ref <5.7)
Mean Plasma Glucose: 123 (calc)
eAG (mmol/L): 6.8 (calc)

## 2017-04-30 LAB — T4, FREE: FREE T4: 0.9 ng/dL (ref 0.8–1.8)

## 2017-05-03 ENCOUNTER — Ambulatory Visit: Payer: Medicare (Managed Care) | Admitting: Neurology

## 2017-05-04 ENCOUNTER — Encounter: Payer: Self-pay | Admitting: Physician Assistant

## 2017-05-10 DIAGNOSIS — G4733 Obstructive sleep apnea (adult) (pediatric): Secondary | ICD-10-CM | POA: Diagnosis not present

## 2017-05-14 ENCOUNTER — Encounter: Payer: Self-pay | Admitting: Physician Assistant

## 2017-05-14 ENCOUNTER — Ambulatory Visit: Payer: Medicare HMO | Admitting: Physician Assistant

## 2017-05-20 ENCOUNTER — Encounter: Payer: Self-pay | Admitting: Physician Assistant

## 2017-05-23 ENCOUNTER — Other Ambulatory Visit: Payer: Self-pay | Admitting: Physician Assistant

## 2017-05-23 DIAGNOSIS — I10 Essential (primary) hypertension: Secondary | ICD-10-CM

## 2017-05-25 ENCOUNTER — Encounter: Payer: Self-pay | Admitting: Physician Assistant

## 2017-05-28 ENCOUNTER — Ambulatory Visit: Payer: Self-pay

## 2017-05-28 ENCOUNTER — Ambulatory Visit: Payer: Medicare HMO | Admitting: Physician Assistant

## 2017-05-28 ENCOUNTER — Telehealth: Payer: Self-pay | Admitting: Physician Assistant

## 2017-05-28 NOTE — Telephone Encounter (Signed)
See triage

## 2017-05-28 NOTE — Telephone Encounter (Signed)
  Reason for Disposition . [1] Sinus congestion (pressure, fullness) AND [2] present > 10 days  Answer Assessment - Initial Assessment Questions 1. LOCATION: "Where does it hurt?"      Facial pain 2. ONSET: "When did the sinus pain start?"  (e.g., hours, days)      Started 04/27/17 3. SEVERITY: "How bad is the pain?"   (Scale 1-10; mild, moderate or severe)   - MILD (1-3): doesn't interfere with normal activities    - MODERATE (4-7): interferes with normal activities (e.g., work or school) or awakens from sleep   - SEVERE (8-10): excruciating pain and patient unable to do any normal activities        7 4. RECURRENT SYMPTOM: "Have you ever had sinus problems before?" If so, ask: "When was the last time?" and "What happened that time?"      Yes 5. NASAL CONGESTION: "Is the nose blocked?" If so, ask, "Can you open it or must you breathe through the mouth?"     Can breathe through nose 6. NASAL DISCHARGE: "Do you have discharge from your nose?" If so ask, "What color?"     Yes - Brown 7. FEVER: "Do you have a fever?" If so, ask: "What is it, how was it measured, and when did it start?"      No 8. OTHER SYMPTOMS: "Do you have any other symptoms?" (e.g., sore throat, cough, earache, difficulty breathing)     Facial pain and lethargy 9. PREGNANCY: "Is there any chance you are pregnant?" "When was your last menstrual period?"     No  Protocols used: SINUS PAIN OR CONGESTION-A-AH Unable to wear CPAP mask because of facial discomfort. Using "nasal spray I have here at home." Appointment made for tomorrow.

## 2017-05-28 NOTE — Telephone Encounter (Signed)
Copied from CRM [id number]. Topic: Quick Communication - See Telephone Encounter >> May 28, 2017  9:35 AM Everardo PacificMoton, Lisha Vitale, NT wrote: CRM for notification. See Telephone encounter for: Patient can't make appointment today and would like to know if her doctor can call her in a z-pack for her sinus infection  05/28/17.

## 2017-05-29 ENCOUNTER — Ambulatory Visit (INDEPENDENT_AMBULATORY_CARE_PROVIDER_SITE_OTHER): Payer: Medicare HMO | Admitting: Physician Assistant

## 2017-05-29 ENCOUNTER — Other Ambulatory Visit: Payer: Self-pay

## 2017-05-29 ENCOUNTER — Encounter: Payer: Self-pay | Admitting: Physician Assistant

## 2017-05-29 VITALS — BP 138/82 | HR 98 | Temp 98.1°F | Resp 16 | Ht 69.0 in | Wt 341.0 lb

## 2017-05-29 DIAGNOSIS — J019 Acute sinusitis, unspecified: Secondary | ICD-10-CM | POA: Diagnosis not present

## 2017-05-29 DIAGNOSIS — F32A Depression, unspecified: Secondary | ICD-10-CM

## 2017-05-29 DIAGNOSIS — F329 Major depressive disorder, single episode, unspecified: Secondary | ICD-10-CM | POA: Diagnosis not present

## 2017-05-29 DIAGNOSIS — F419 Anxiety disorder, unspecified: Secondary | ICD-10-CM | POA: Diagnosis not present

## 2017-05-29 DIAGNOSIS — M797 Fibromyalgia: Secondary | ICD-10-CM | POA: Diagnosis not present

## 2017-05-29 DIAGNOSIS — R69 Illness, unspecified: Secondary | ICD-10-CM | POA: Diagnosis not present

## 2017-05-29 MED ORDER — DIAZEPAM 5 MG PO TABS: 5.0000 mg | ORAL_TABLET | Freq: Three times a day (TID) | ORAL | 0 refills | Status: DC | PRN

## 2017-05-29 MED ORDER — PREDNISONE 20 MG PO TABS: ORAL_TABLET | ORAL | 0 refills | Status: DC

## 2017-05-29 MED ORDER — DOXYCYCLINE HYCLATE 100 MG PO CAPS: 100.0000 mg | ORAL_CAPSULE | Freq: Two times a day (BID) | ORAL | 0 refills | Status: DC

## 2017-05-29 MED ORDER — TRAMADOL HCL 50 MG PO TABS: 50.0000 mg | ORAL_TABLET | Freq: Three times a day (TID) | ORAL | 0 refills | Status: DC | PRN

## 2017-05-29 NOTE — Progress Notes (Signed)
Chief Complaint  Patient presents with  . Facial Pain    nasal congestion, facial pressure/pain, x 1 week   Subjective:    Patient ID: Brianna Roberts, female    DOB: [birth year], 51 y.o.   MRN: [id number]  HPI Brianna Roberts is a 51 year old female who presents for right sided facial pain.  She states that she has had pain and swelling in her right maxillary and frontal sinuses for 1 week. The pain is a 6/10 at its worst. She is also having swelling in that area. She is also having some coughing and postnasal drip. She has been using Azelastine nasal spray and that has been helping. She also has a history of migraine headaches  Patient has a new CPAP mask and thinks that might have caused her sysmptoms  Patient states that her IBS has been worse. She has been having a lot of diarrhea.  Review of Systems  Constitutional: Positive for appetite change and fatigue. Negative for fever.  HENT: Positive for postnasal drip, sinus pressure and sinus pain (right side ). Negative for sore throat.   Respiratory: Negative for shortness of breath.   Cardiovascular: Negative for chest pain and palpitations.  Gastrointestinal: Positive for diarrhea. Negative for abdominal pain, blood in stool, constipation, nausea and vomiting.  Genitourinary: Negative for dysuria and frequency.  Neurological: Positive for light-headedness (for a month). Negative for headaches.   Patient Active Problem List   Diagnosis Date Noted  . Chronic pain syndrome 03/17/2017  . Incomplete emptying of bladder 02/22/2017  . Prediabetes 12/31/2016  . Hypomagnesemia 12/27/2016  . Post concussion syndrome 10/06/2016  . 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 Fibromyalgia 10/05/2016  . Benign essential HTN 10/05/2016  . Hypothyroidism 10/05/2016  . Allergic rhinitis 10/05/2016  . Urticaria 10/05/2016  . Chronic fatigue syndrome 10/05/2016  . Asthma, chronic 10/05/2016  . HSV (herpes simplex virus)  anogenital infection 10/05/2016  . Shingles 10/05/2016  . Anxiety and depression 10/05/2016  . Bilateral leg edema 10/05/2016  . Ovarian cyst 10/05/2016  . Nephrolithiasis 10/05/2016  . IBS (irritable bowel syndrome) 10/05/2016  . OSA (obstructive sleep apnea) 10/05/2016  . Left-sided weakness 10/05/2016  . Migraine 10/05/2016  . BMI 45.0-49.9, adult (HCC) 10/05/2016  . DDD (degenerative disc disease), cervical   . Insomnia   . Right lateral epicondylitis 03/29/2015   Past Medical History:  Diagnosis Date  . Allergy   . Anxiety   . Arthritis   . Asthma   . 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 Concussion   . DDD (degenerative disc disease), cervical   . Depression   . Homelessness   . Hypertension   . Insomnia   . Neuromuscular disorder (HCC)   . Thyroid disease    Current Outpatient Medications on File Prior to Visit  Medication Sig Dispense Refill  . acetaminophen (TYLENOL) 500 MG tablet Take 500-1,000 mg by mouth 2 (two) times daily as needed for mild pain.    Marland Kitchen acyclovir (ZOVIRAX) 200 MG capsule Take 1 capsule (200 mg total) by mouth 2 (two) times daily. 180 capsule 3  . albuterol (PROVENTIL HFA;VENTOLIN HFA) 108 (90 Base) MCG/ACT inhaler Inhale 1-2 puffs into the lungs every 6 (six) hours as needed for wheezing or shortness of breath. (Patient taking differently: Inhale 2 puffs into the lungs every 6 (six) hours as needed for wheezing or shortness of breath. ) 1 Inhaler 3  . amitriptyline (ELAVIL) 75 MG  tablet Take 1 tablet (75 mg total) by mouth at bedtime. 90 tablet 3  . amLODipine (NORVASC) 5 MG tablet Take 1 tablet (5 mg total) by mouth daily. 30 tablet 1  . ARIPiprazole (ABILIFY) 10 MG tablet Take 1 tablet (10 mg total) by mouth daily. 90 tablet 0  . azelastine (ASTELIN) 0.1 % nasal spray Place 2 sprays into both nostrils 2 (two) times daily. Use in each nostril as directed 30 mL 0  . budesonide-formoterol (SYMBICORT) 160-4.5 MCG/ACT  inhaler Inhale 2 puffs into the lungs 2 (two) times daily. 3 Inhaler 3  . diphenhydrAMINE (BENADRYL) 25 mg capsule Take 25 mg by mouth 2 (two) times daily.    . fluticasone (FLONASE) 50 MCG/ACT nasal spray USE 1 SPRAY IN EACH NOSTRIL DAILY. 16 g 0  . furosemide (LASIX) 40 MG tablet Take 1 tablet (40 mg total) by mouth 2 (two) times daily. 180 tablet 3  . glucose blood test strip Use as instructed 100 each prn  . hydrOXYzine (ATARAX/VISTARIL) 10 MG tablet Take 1 tablet (10 mg total) by mouth at bedtime. (Patient taking differently: Take 10 mg by mouth at bedtime as needed for itching (angioedema). ) 90 tablet 3  . ipratropium-albuterol (DUONEB) 0.5-2.5 (3) MG/3ML SOLN Take 3 mLs by nebulization every 6 (six) hours as needed (for breathing). 360 mL 3  . Lancets Thin MISC Use to check home glucose daily 100 each prn  . levothyroxine (SYNTHROID, LEVOTHROID) 25 MCG tablet Take 1 tablet (25 mcg total) by mouth daily before breakfast. (Patient taking differently: Take 25 mcg by mouth daily. ) 90 tablet 3  . losartan (COZAAR) 100 MG tablet Take 1 tablet (100 mg total) by mouth daily. 90 tablet 3  . meloxicam (MOBIC) 15 MG tablet Take 1 tablet (15 mg total) by mouth daily. 90 tablet 0  . methocarbamol (ROBAXIN) 500 MG tablet Take 2 tablets (1,000 mg total) by mouth every 8 (eight) hours as needed for muscle spasms. 30 tablet 0  . metoprolol (LOPRESSOR) 100 MG tablet Take 1 tablet (100 mg total) by mouth 2 (two) times daily. 180 tablet 3  . Nerve Stimulator (PRO COMFORT TENS UNIT) DEVI 1 Device by Does not apply route daily as needed. 1 Device 1  . nystatin (MYCOSTATIN/NYSTOP) powder Apply topically 2 (two) times daily. (Patient taking differently: Apply 8 g topically 2 (two) times daily. ) 90 g prn  . nystatin cream (MYCOSTATIN) Apply 1 application topically 2 (two) times daily.    . ondansetron (ZOFRAN) 4 MG tablet Take 1 tablet (4 mg total) by mouth every 8 (eight) hours as needed for nausea or vomiting. 12  tablet 0  . oxyCODONE (OXY IR/ROXICODONE) 5 MG immediate release tablet Take 1 tablet (5 mg total) by mouth every 4 (four) hours as needed for severe pain. 30 tablet 0  . potassium chloride SA (K-DUR,KLOR-CON) 20 MEQ tablet TAKE 1 TABLET BY MOUTH TWICE DAILY. 60 tablet 0  . Probiotic Product (ALIGN) 4 MG CAPS Take 4 mg by mouth daily.    . sertraline (ZOLOFT) 100 MG tablet Take 1 tablet (100 mg total) by mouth daily. 90 tablet 3  . triamcinolone ointment (KENALOG) 0.5 % Apply 1 application topically 2 (two) times daily.    Marland Kitchen. trolamine salicylate (ASPERCREME) 10 % cream Apply 1 application topically at bedtime as needed for muscle pain.     No current facility-administered medications on file prior to visit.    Allergies  Allergen Reactions  . Sulfa Antibiotics Anaphylaxis  .  Iodinated Diagnostic Agents Other (See Comments)    Patient complained of throat/ uvula swelling s/p injection on 02/01/2017 but states she isnt allergic to the dye and has had several times prior   . Codeine Other (See Comments)    Unknown - patient said mother told her she was allergic as a kid  . Dilaudid [Hydromorphone Hcl] Itching  . Other     Other reaction(s): Info Not Available  . Penicillins Other (See Comments)    Has patient had a PCN reaction causing immediate rash, facial/tongue/throat swelling, SOB or lightheadedness with hypotension: Unknown Has patient had a PCN reaction causing severe rash involving mucus membranes or skin necrosis: Unknown Has patient had a PCN reaction that required hospitalization: Unknown Has patient had a PCN reaction occurring within the last 10 years: Unknown If all of the above answers are "NO", then may proceed with Cephalosporin use.  Had a reaction when she was young.   Marland Kitchen. Spironolactone Other (See Comments)    "GI Issues"      Objective:   Physical Exam  Constitutional: She appears well-developed and well-nourished.  HENT:  Head: Normocephalic and atraumatic.    Right Ear: External ear normal.  Left Ear: External ear normal.  Mouth/Throat: Oropharynx is clear and moist.  Generalized pain to palpation of right side of face but worse in maxillary and frontal sinuses  Eyes: Conjunctivae are normal. Pupils are equal, round, and reactive to light.  Neck: Neck supple.  Cardiovascular: Normal rate, regular rhythm and normal heart sounds.  Pulmonary/Chest: Effort normal and breath sounds normal.  Lymphadenopathy:    She has no cervical adenopathy.  Neurological: She is alert.  Skin: Skin is warm.  Psychiatric: She has a normal mood and affect.      Assessment & Plan:  1. Acute non-recurrent sinusitis, unspecified location Prescribed medications below. - doxycycline (VIBRAMYCIN) 100 MG capsule; Take 1 capsule (100 mg total) by mouth 2 (two) times daily.  Dispense: 20 capsule; Refill: 0 - predniSONE (DELTASONE) 20 MG tablet; Take 3 PO QAM x3days, 2 PO QAM x3days, 1 PO QAM x3days  Dispense: 18 tablet; Refill: 0 -Patient education provided.  2. Fibromyalgia Refilled medication per patient request - traMADol (ULTRAM) 50 MG tablet; Take 1-2 tablets (50-100 mg total) by mouth every 8 (eight) hours as needed.  Dispense: 90 tablet; Refill: 0  3. Anxiety and depression Refilled medication per patient request - diazepam (VALIUM) 5 MG tablet; Take 1 tablet (5 mg total) by mouth every 8 (eight) hours as needed for anxiety.  Dispense: 90 tablet; Refill: 0  Follow up as needed if symptoms persist or worsen.   Niyanna Asch W [place], [place]-PA

## 2017-05-29 NOTE — Progress Notes (Signed)
Patient ID: Brianna Roberts, female    DOB: Mar 21, 1966, 51 y.o.   MRN: [id number]  PCP: Porfirio Oar, PA-C  Chief Complaint  Patient presents with  . Facial Pain    nasal congestion, facial pressure/pain, x 1 week    Subjective:   Presents for evaluation of 1 week of RIGHT sided facial pain and nasal congestion. 6/10 at worst. Feels swelling in the same area. Cough. Post-nasal drip.  Azelastine is helping. Thinks her new CPAP mask may be causing the problem. She has a new nasal pillows device.  Notes that her diarrhea has been worse, due to IBS flare.  I saw her for acute sinusitis 04/02/2017, treated with azithromycin and oral prednisone. She is Sulfa and PCN allergic.  Has not had the brain MRI ordered for evaluation of progressively worsening migraine due to the cost. She is responsible for 20%, and cannot currently afford it.    Review of Systems Constitutional: Positive for appetite change and fatigue. Negative for fever.  HENT: Positive for postnasal drip, sinus pressure and sinus pain (right side ). Negative for sore throat.   Respiratory: Negative for shortness of breath.   Cardiovascular: Negative for chest pain and palpitations.  Gastrointestinal: Positive for diarrhea. Negative for abdominal pain, blood in stool, constipation, nausea and vomiting.  Genitourinary: Negative for dysuria and frequency.  Neurological: Positive for light-headedness (for a month). Negative for headaches.       Patient Active Problem List   Diagnosis Date Noted  . Chronic pain syndrome 03/17/2017  . Incomplete emptying of bladder 02/22/2017  . Prediabetes 12/31/2016  . Hypomagnesemia 12/27/2016  . Post concussion syndrome 10/06/2016  . 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 Fibromyalgia 10/05/2016  . Benign essential HTN 10/05/2016  . Hypothyroidism 10/05/2016  . Allergic rhinitis 10/05/2016  . Urticaria 10/05/2016  . Chronic fatigue syndrome  10/05/2016  . Asthma, chronic 10/05/2016  . HSV (herpes simplex virus) anogenital infection 10/05/2016  . Shingles 10/05/2016  . Anxiety and depression 10/05/2016  . Bilateral leg edema 10/05/2016  . Ovarian cyst 10/05/2016  . Nephrolithiasis 10/05/2016  . IBS (irritable bowel syndrome) 10/05/2016  . OSA (obstructive sleep apnea) 10/05/2016  . Left-sided weakness 10/05/2016  . Migraine 10/05/2016  . BMI 45.0-49.9, adult (HCC) 10/05/2016  . DDD (degenerative disc disease), cervical   . Insomnia   . Right lateral epicondylitis 03/29/2015     Prior to Admission medications   Medication Sig Start Date End Date Taking? Authorizing Provider  acetaminophen (TYLENOL) 500 MG tablet Take 500-1,000 mg by mouth 2 (two) times daily as needed for mild pain.   Yes [provider]  acyclovir (ZOVIRAX) 200 MG capsule Take 1 capsule (200 mg total) by mouth 2 (two) times daily. 10/05/16  Yes Ashe Graybeal, PA-C  albuterol (PROVENTIL HFA;VENTOLIN HFA) 108 (90 Base) MCG/ACT inhaler Inhale 1-2 puffs into the lungs every 6 (six) hours as needed for wheezing or shortness of breath. Patient taking differently: Inhale 2 puffs into the lungs every 6 (six) hours as needed for wheezing or shortness of breath.  10/05/16  Yes Ilya Ess, PA-C  amitriptyline (ELAVIL) 75 MG tablet Take 1 tablet (75 mg total) by mouth at bedtime. 12/27/16  Yes Marylynne Keelin, PA-C  amLODipine (NORVASC) 5 MG tablet Take 1 tablet (5 mg total) by mouth daily. 04/27/17  Yes Kitzia Camus, PA-C  ARIPiprazole (ABILIFY) 10 MG tablet Take 1 tablet (10 mg total) by mouth daily. 10/05/16  Yes Leotis Shames,  Falon Huesca, PA-C  azelastine (ASTELIN) 0.1 % nasal spray Place 2 sprays into both nostrils 2 (two) times daily. Use in each nostril as directed 04/02/17  Yes Marguerita Stapp, PA-C  budesonide-formoterol (SYMBICORT) 160-4.5 MCG/ACT inhaler Inhale 2 puffs into the lungs 2 (two) times daily. 12/27/16  Yes Avalin Briley, PA-C  diazepam (VALIUM)  5 MG tablet Take 1 tablet (5 mg total) by mouth every 8 (eight) hours as needed for anxiety. 03/28/17  Yes Chidubem Chaires, PA-C  diphenhydrAMINE (BENADRYL) 25 mg capsule Take 25 mg by mouth 2 (two) times daily.   Yes [provider]  fluticasone (FLONASE) 50 MCG/ACT nasal spray USE 1 SPRAY IN EACH NOSTRIL DAILY. 04/02/17  Yes Tekelia Kareem, PA-C  furosemide (LASIX) 40 MG tablet Take 1 tablet (40 mg total) by mouth 2 (two) times daily. 10/05/16  Yes Rama Sorci, PA-C  glucose blood test strip Use as instructed 04/17/17  Yes Ayeshia Coppin, PA-C  hydrOXYzine (ATARAX/VISTARIL) 10 MG tablet Take 1 tablet (10 mg total) by mouth at bedtime. Patient taking differently: Take 10 mg by mouth at bedtime as needed for itching (angioedema).  10/05/16  Yes Delmon Andrada, PA-C  ipratropium-albuterol (DUONEB) 0.5-2.5 (3) MG/3ML SOLN Take 3 mLs by nebulization every 6 (six) hours as needed (for breathing). 10/05/16  Yes Porfirio Oar, PA-C  Lancets Thin MISC Use to check home glucose daily 03/28/17  Yes Kimberlea Schlag, PA-C  levothyroxine (SYNTHROID, LEVOTHROID) 25 MCG tablet Take 1 tablet (25 mcg total) by mouth daily before breakfast. Patient taking differently: Take 25 mcg by mouth daily.  10/05/16  Yes Marshon Bangs, PA-C  losartan (COZAAR) 100 MG tablet Take 1 tablet (100 mg total) by mouth daily. 10/05/16  Yes Kenlee Maler, PA-C  meloxicam (MOBIC) 15 MG tablet Take 1 tablet (15 mg total) by mouth daily. 03/28/17  Yes Gertie Broerman, PA-C  methocarbamol (ROBAXIN) 500 MG tablet Take 2 tablets (1,000 mg total) by mouth every 8 (eight) hours as needed for muscle spasms. 02/01/17  Yes Loren Racer, MD  metoprolol (LOPRESSOR) 100 MG tablet Take 1 tablet (100 mg total) by mouth 2 (two) times daily. 10/05/16  Yes Porfirio Oar, PA-C  Nerve Stimulator (PRO COMFORT TENS UNIT) DEVI 1 Device by Does not apply route daily as needed. 12/27/16  Yes Jaquayla Hege, PA-C  nystatin (MYCOSTATIN/NYSTOP)  powder Apply topically 2 (two) times daily. Patient taking differently: Apply 8 g topically 2 (two) times daily.  12/27/16 12/27/17 Yes Michaeljoseph Revolorio, PA-C  nystatin cream (MYCOSTATIN) Apply 1 application topically 2 (two) times daily.   Yes [provider]  ondansetron (ZOFRAN) 4 MG tablet Take 1 tablet (4 mg total) by mouth every 8 (eight) hours as needed for nausea or vomiting. 02/01/17  Yes Loren Racer, MD  oxyCODONE (OXY IR/ROXICODONE) 5 MG immediate release tablet Take 1 tablet (5 mg total) by mouth every 4 (four) hours as needed for severe pain. 12/27/16  Yes Vella Colquitt, PA-C  potassium chloride SA (K-DUR,KLOR-CON) 20 MEQ tablet TAKE 1 TABLET BY MOUTH TWICE DAILY. 05/23/17  Yes Redmond Whittley, PA-C  Probiotic Product (ALIGN) 4 MG CAPS Take 4 mg by mouth daily.   Yes [provider]  sertraline (ZOLOFT) 100 MG tablet Take 1 tablet (100 mg total) by mouth daily. 10/05/16  Yes Juanda Luba, PA-C  traMADol (ULTRAM) 50 MG tablet Take 1-2 tablets (50-100 mg total) by mouth every 8 (eight) hours as needed. 03/13/17  Yes Lynel Forester, PA-C  triamcinolone ointment (KENALOG) 0.5 % Apply 1 application topically  2 (two) times daily.   Yes [provider]  trolamine salicylate (ASPERCREME) 10 % cream Apply 1 application topically at bedtime as needed for muscle pain.   Yes [provider]     Allergies  Allergen Reactions  . Sulfa Antibiotics Anaphylaxis  . Iodinated Diagnostic Agents Other (See Comments)    Patient complained of throat/ uvula swelling s/p injection on 02/01/2017 but states she isnt allergic to the dye and has had several times prior   . Codeine Other (See Comments)    Unknown - patient said mother told her she was allergic as a kid  . Dilaudid [Hydromorphone Hcl] Itching  . Other     Other reaction(s): Info Not Available  . Penicillins Other (See Comments)    Has patient had a PCN reaction causing immediate rash, facial/tongue/throat  swelling, SOB or lightheadedness with hypotension: Unknown Has patient had a PCN reaction causing severe rash involving mucus membranes or skin necrosis: Unknown Has patient had a PCN reaction that required hospitalization: Unknown Has patient had a PCN reaction occurring within the last 10 years: Unknown If all of the above answers are "NO", then may proceed with Cephalosporin use.  Had a reaction when she was young.   Marland Kitchen. Spironolactone Other (See Comments)    "GI Issues"       Objective:  Physical Exam  Constitutional: She is oriented to person, place, and time. She appears well-developed and well-nourished. She is active and cooperative. No distress.  BP 138/82   Pulse 98   Temp 98.1 F (36.7 C)   Resp 16   Ht 5\' 9"  (1.753 m)   Wt (!) 341 lb (154.7 kg)   SpO2 94%   BMI 50.36 kg/m   HENT:  Head: Normocephalic and atraumatic.  Right Ear: Hearing, tympanic membrane, external ear and ear canal normal.  Left Ear: Hearing, tympanic membrane, external ear and ear canal normal.  Nose: Mucosal edema present. No rhinorrhea. No epistaxis.  No foreign bodies. Right sinus exhibits maxillary sinus tenderness and frontal sinus tenderness. Left sinus exhibits no maxillary sinus tenderness and no frontal sinus tenderness.  Mouth/Throat: Uvula is midline, oropharynx is clear and moist and mucous membranes are normal. No oral lesions.  Eyes: Conjunctivae are normal. No scleral icterus.  Neck: Normal range of motion. Neck supple. No thyromegaly present.  Cardiovascular: Normal rate, regular rhythm and normal heart sounds.  Pulses:      Radial pulses are 2+ on the right side, and 2+ on the left side.  Pulmonary/Chest: Effort normal and breath sounds normal.  Lymphadenopathy:       Head (right side): No tonsillar, no preauricular, no posterior auricular and no occipital adenopathy present.       Head (left side): No tonsillar, no preauricular, no posterior auricular and no occipital adenopathy  present.    She has no cervical adenopathy.       Right: No supraclavicular adenopathy present.       Left: No supraclavicular adenopathy present.  Neurological: She is alert and oriented to person, place, and time. No sensory deficit.  Skin: Skin is warm, dry and intact. No rash noted. No cyanosis or erythema. Nails show no clubbing.  Psychiatric: She has a normal mood and affect. Her speech is normal and behavior is normal.       Assessment & Plan:   Problem List Items Addressed This Visit    Fibromyalgia   Relevant Medications   traMADol (ULTRAM) 50 MG tablet  Anxiety and depression   Relevant Medications   diazepam (VALIUM) 5 MG tablet    Other Visit Diagnoses    Acute non-recurrent sinusitis, unspecified location    -  Primary   Treat with doxycycline this time. Again, prednisone. monitor glucose.    Relevant Medications   doxycycline (VIBRAMYCIN) 100 MG capsule   predniSONE (DELTASONE) 20 MG tablet       Return if symptoms worsen or fail to improve.   Fernande Brashelle S. Asaf Elmquist, PA-C [facility]

## 2017-05-29 NOTE — Patient Instructions (Addendum)
Please call [facility] to schedule [phone].   IF you received an x-ray today, you will receive an invoice from [facility]. Please contact [facility] at [phone] with questions or concerns regarding your invoice.   IF you received labwork today, you will receive an invoice from [facility]. Please contact [facility] at [phone] with questions or concerns regarding your invoice.   Our billing staff will not be able to assist you with questions regarding bills from these companies.  You will be contacted with the lab results as soon as they are available. The fastest way to get your results is to activate your My Chart account. Instructions are located on the last page of this paperwork. If you have not heard from us regarding the results in 2 weeks, please contact this office.

## 2017-06-03 ENCOUNTER — Telehealth: Payer: Self-pay | Admitting: Physician Assistant

## 2017-06-03 DIAGNOSIS — M797 Fibromyalgia: Secondary | ICD-10-CM

## 2017-06-03 MED ORDER — PREVAIL WASHCLOTHS MISC
99 refills | Status: DC
Start: 2017-06-03 — End: 2023-08-30

## 2017-06-03 NOTE — Telephone Encounter (Signed)
Patient uses [facility] for TENS unit and [employer] pre-moistened wash cloths.  She needs new prescriptions for both sent there. Phone [phone] Fax [phone]  The pharmacist she knows is Luisa Hartatrick. The Medical supply staff are Marylene Landngela and Mozambiquerina.

## 2017-06-13 ENCOUNTER — Encounter: Payer: Self-pay | Admitting: Physician Assistant

## 2017-06-14 ENCOUNTER — Other Ambulatory Visit: Payer: Self-pay | Admitting: Physician Assistant

## 2017-06-14 ENCOUNTER — Telehealth: Payer: Self-pay | Admitting: Physician Assistant

## 2017-06-14 DIAGNOSIS — I10 Essential (primary) hypertension: Secondary | ICD-10-CM

## 2017-06-14 NOTE — Telephone Encounter (Signed)
Copied from CRM [phone]. Topic: Quick Communication - See Telephone Encounter >> Jun 14, 2017  2:35 PM Guinevere FerrariMorris, Sheron Tallman E, NT wrote: CRM for notification. See Telephone encounter for: Irving Burtonmily is calling because she had questions about  some e script prescriptions. Medications are amLODipine (NORVASC) 5 MG tablet and  potassium chloride SA (K-DUR,KLOR-CON) 20 MEQ tablet         06/14/17.

## 2017-06-14 NOTE — Telephone Encounter (Signed)
Refill request for Norvasc and K-Dur / LOV 05/29/17 with Porfirio Oarhelle Jeffery / Both of these medications have been filled today

## 2017-06-20 ENCOUNTER — Other Ambulatory Visit: Payer: Self-pay | Admitting: Physician Assistant

## 2017-06-20 DIAGNOSIS — I10 Essential (primary) hypertension: Secondary | ICD-10-CM

## 2017-06-20 NOTE — Telephone Encounter (Signed)
Error-Close Encounter 

## 2017-06-24 ENCOUNTER — Other Ambulatory Visit: Payer: Self-pay

## 2017-06-24 DIAGNOSIS — I10 Essential (primary) hypertension: Secondary | ICD-10-CM

## 2017-06-24 MED ORDER — POTASSIUM CHLORIDE CRYS ER 20 MEQ PO TBCR: 20.0000 meq | EXTENDED_RELEASE_TABLET | Freq: Two times a day (BID) | ORAL | 0 refills | Status: DC

## 2017-06-24 MED ORDER — AMLODIPINE BESYLATE 5 MG PO TABS: 5.0000 mg | ORAL_TABLET | Freq: Every day | ORAL | 1 refills | Status: DC

## 2017-06-27 ENCOUNTER — Other Ambulatory Visit: Payer: Self-pay | Admitting: Physician Assistant

## 2017-06-27 DIAGNOSIS — J019 Acute sinusitis, unspecified: Secondary | ICD-10-CM

## 2017-06-30 ENCOUNTER — Encounter: Payer: Self-pay | Admitting: Physician Assistant

## 2017-07-02 NOTE — Telephone Encounter (Signed)
Please see the patient's message in My Chart.  Please contact [employer] to get the form(s) we need to get her medications authorized, and to find out what albuterol product is covered by her plan.  Thank you!

## 2017-07-03 DIAGNOSIS — M797 Fibromyalgia: Secondary | ICD-10-CM | POA: Diagnosis not present

## 2017-07-21 ENCOUNTER — Encounter: Payer: Self-pay | Admitting: Physician Assistant

## 2017-07-29 ENCOUNTER — Other Ambulatory Visit: Payer: Self-pay | Admitting: Physician Assistant

## 2017-07-29 DIAGNOSIS — F329 Major depressive disorder, single episode, unspecified: Secondary | ICD-10-CM

## 2017-07-29 DIAGNOSIS — F419 Anxiety disorder, unspecified: Secondary | ICD-10-CM

## 2017-07-29 DIAGNOSIS — F32A Depression, unspecified: Secondary | ICD-10-CM

## 2017-07-29 DIAGNOSIS — M797 Fibromyalgia: Secondary | ICD-10-CM

## 2017-07-29 NOTE — Telephone Encounter (Signed)
Meloxicam refill Last OV: 03/13/17 Last Refill:03/28/17 Pharmacy:[facility]  Tramadol refill Last OV: 03/13/17 Last Refill:05/29/17 Pharmacy:[facility]  Diazepam refill Last OV: 03/13/17 Last Refill:05/29/17 [facility]

## 2017-07-30 NOTE — Telephone Encounter (Signed)
Rx sent electronically.  Meds ordered this encounter  Medications  . meloxicam (MOBIC) 15 MG tablet    Sig: TAKE 1 TABLET EACH DAY.    Dispense:  90 tablet    Refill:  0  . traMADol (ULTRAM) 50 MG tablet    Sig: TAKE 1 OR 2 TABLETS EVERY 8 HOURS AS NEEDED.    Dispense:  90 tablet    Refill:  0  . diazepam (VALIUM) 5 MG tablet    Sig: TAKE 1 TABLET EVERY 8 HOURS AS NEEDED FOR ANXIETY.    Dispense:  90 tablet    Refill:  0

## 2017-08-03 DIAGNOSIS — M797 Fibromyalgia: Secondary | ICD-10-CM | POA: Diagnosis not present

## 2017-08-07 ENCOUNTER — Ambulatory Visit: Payer: Self-pay | Admitting: Physician Assistant

## 2017-08-08 DIAGNOSIS — E039 Hypothyroidism, unspecified: Secondary | ICD-10-CM | POA: Diagnosis not present

## 2017-08-08 DIAGNOSIS — I10 Essential (primary) hypertension: Secondary | ICD-10-CM | POA: Diagnosis not present

## 2017-08-08 DIAGNOSIS — Z6841 Body Mass Index (BMI) 40.0 and over, adult: Secondary | ICD-10-CM | POA: Diagnosis not present

## 2017-08-08 DIAGNOSIS — B009 Herpesviral infection, unspecified: Secondary | ICD-10-CM | POA: Diagnosis not present

## 2017-08-08 DIAGNOSIS — J45909 Unspecified asthma, uncomplicated: Secondary | ICD-10-CM | POA: Diagnosis not present

## 2017-08-08 DIAGNOSIS — G4730 Sleep apnea, unspecified: Secondary | ICD-10-CM | POA: Diagnosis not present

## 2017-08-08 DIAGNOSIS — R69 Illness, unspecified: Secondary | ICD-10-CM | POA: Diagnosis not present

## 2017-08-08 DIAGNOSIS — F419 Anxiety disorder, unspecified: Secondary | ICD-10-CM | POA: Diagnosis not present

## 2017-08-08 DIAGNOSIS — G8929 Other chronic pain: Secondary | ICD-10-CM | POA: Diagnosis not present

## 2017-08-09 ENCOUNTER — Other Ambulatory Visit: Payer: Self-pay

## 2017-08-09 ENCOUNTER — Ambulatory Visit (INDEPENDENT_AMBULATORY_CARE_PROVIDER_SITE_OTHER): Payer: Medicare HMO | Admitting: Physician Assistant

## 2017-08-09 ENCOUNTER — Encounter: Payer: Self-pay | Admitting: Physician Assistant

## 2017-08-09 VITALS — BP 125/84 | HR 95 | Temp 98.4°F | Resp 16 | Ht 69.0 in | Wt 341.4 lb

## 2017-08-09 DIAGNOSIS — F329 Major depressive disorder, single episode, unspecified: Secondary | ICD-10-CM

## 2017-08-09 DIAGNOSIS — F419 Anxiety disorder, unspecified: Secondary | ICD-10-CM

## 2017-08-09 DIAGNOSIS — J45909 Unspecified asthma, uncomplicated: Secondary | ICD-10-CM | POA: Diagnosis not present

## 2017-08-09 DIAGNOSIS — I10 Essential (primary) hypertension: Secondary | ICD-10-CM | POA: Diagnosis not present

## 2017-08-09 DIAGNOSIS — M791 Myalgia, unspecified site: Secondary | ICD-10-CM

## 2017-08-09 DIAGNOSIS — R600 Localized edema: Secondary | ICD-10-CM

## 2017-08-09 DIAGNOSIS — Z1231 Encounter for screening mammogram for malignant neoplasm of breast: Secondary | ICD-10-CM | POA: Diagnosis not present

## 2017-08-09 DIAGNOSIS — R202 Paresthesia of skin: Secondary | ICD-10-CM | POA: Diagnosis not present

## 2017-08-09 DIAGNOSIS — E039 Hypothyroidism, unspecified: Secondary | ICD-10-CM | POA: Diagnosis not present

## 2017-08-09 DIAGNOSIS — M545 Low back pain, unspecified: Secondary | ICD-10-CM

## 2017-08-09 DIAGNOSIS — Z23 Encounter for immunization: Secondary | ICD-10-CM | POA: Diagnosis not present

## 2017-08-09 DIAGNOSIS — M79609 Pain in unspecified limb: Secondary | ICD-10-CM

## 2017-08-09 DIAGNOSIS — Z598 Other problems related to housing and economic circumstances: Secondary | ICD-10-CM | POA: Diagnosis not present

## 2017-08-09 DIAGNOSIS — R7303 Prediabetes: Secondary | ICD-10-CM | POA: Diagnosis not present

## 2017-08-09 DIAGNOSIS — M797 Fibromyalgia: Secondary | ICD-10-CM

## 2017-08-09 DIAGNOSIS — R5383 Other fatigue: Secondary | ICD-10-CM | POA: Diagnosis not present

## 2017-08-09 DIAGNOSIS — R69 Illness, unspecified: Secondary | ICD-10-CM | POA: Diagnosis not present

## 2017-08-09 DIAGNOSIS — F32A Depression, unspecified: Secondary | ICD-10-CM

## 2017-08-09 DIAGNOSIS — Z599 Problem related to housing and economic circumstances, unspecified: Secondary | ICD-10-CM

## 2017-08-09 MED ORDER — METOPROLOL TARTRATE 100 MG PO TABS: 100.0000 mg | ORAL_TABLET | Freq: Two times a day (BID) | ORAL | 3 refills | Status: DC

## 2017-08-09 MED ORDER — LEVOTHYROXINE SODIUM 25 MCG PO TABS: 25.0000 ug | ORAL_TABLET | Freq: Every day | ORAL | 3 refills | Status: DC

## 2017-08-09 MED ORDER — MELOXICAM 15 MG PO TABS: ORAL_TABLET | ORAL | 0 refills | Status: DC

## 2017-08-09 MED ORDER — AMLODIPINE BESYLATE 5 MG PO TABS: 5.0000 mg | ORAL_TABLET | Freq: Every day | ORAL | 3 refills | Status: DC

## 2017-08-09 MED ORDER — LOSARTAN POTASSIUM 100 MG PO TABS: 100.0000 mg | ORAL_TABLET | Freq: Every day | ORAL | 3 refills | Status: DC

## 2017-08-09 MED ORDER — ALBUTEROL SULFATE HFA 108 (90 BASE) MCG/ACT IN AERS: 1.0000 | INHALATION_SPRAY | Freq: Four times a day (QID) | RESPIRATORY_TRACT | 3 refills | Status: DC | PRN

## 2017-08-09 MED ORDER — FUROSEMIDE 40 MG PO TABS: 40.0000 mg | ORAL_TABLET | Freq: Two times a day (BID) | ORAL | 3 refills | Status: DC

## 2017-08-09 MED ORDER — AMITRIPTYLINE HCL 75 MG PO TABS: 75.0000 mg | ORAL_TABLET | Freq: Every day | ORAL | 3 refills | Status: DC

## 2017-08-09 MED ORDER — OXYCODONE HCL 5 MG PO TABS: 5.0000 mg | ORAL_TABLET | ORAL | 0 refills | Status: DC | PRN

## 2017-08-09 NOTE — Progress Notes (Signed)
Patient ID: Brianna Roberts, female    DOB: 1966-01-02, 52 y.o.   MRN: [id number]  PCP: Porfirio Oar, PA-C  Chief Complaint  Patient presents with  . Follow-up    blood pressure and fibromyalgia    Subjective:   Presents for evaluation of HTN and fibromyalgia.  Her situation is difficult. She moved here from [place] with no social contacts and has very limited finances. As such, she has not been able to establish with the multiple specialists needed to manage her many chronic medical problems. Her pain often keeps her home bound, and she struggles to advocate for herself to get what she needs. Home BP remains elevated.  Chronic diarrhea has been worse for the past 4 months. Sometimes it's so bad that she develops dizziness. GI referral has been made, but she is unable to afford the visit and anticipated evaluation. "I'll just have to deal with it."  Hand swelling. Brings photos of marked swelling of the hands, without erythema.  Medication problems: -ProAir is no longer on her prescription drug formulary -Amitriptyline is no longer covered, and she pays $90 -needs refill of pain medicine -Ran out of sertraline and didn't have money to buy more x 1 month, and feels great. -Taking OTC B12 gummy. Interested in the injection, because her friend has lost weight -Chronic insomniac. But is sleeping more and better than before, such that she thinks she's sleeping too much. -Tongue bleeding -Continues to smoke up to 1 PPD  Dietary: -binges on junk food. -eats only once/day   Review of Systems Pan-positive. It is of note that her mood is variously very low and cheerful. Not manic. Complaints that are identified as very bothersome to one member of the team are minimized to another. Constitutional: Positive for fatigue.  HENT: Negative.   Eyes: Negative.   Respiratory: Negative.   Cardiovascular: Negative.   Gastrointestinal: Positive for diarrhea. Negative for abdominal  distention, abdominal pain and constipation.  Endocrine: Negative.   Genitourinary: Negative.   Musculoskeletal: Positive for arthralgias, back pain, joint swelling and myalgias. Negative for gait problem.  Allergic/Immunologic: Negative.   Neurological: Positive for dizziness and light-headedness. Negative for tremors, seizures, syncope, facial asymmetry, speech difficulty, numbness and headaches.  Hematological: Bruises/bleeds easily (Tongue bleeding randomly occured.).  Psychiatric/Behavioral: Negative for dysphoric mood.   Depression screen [facility] 2/9 05/29/2017 04/02/2017 03/13/2017 02/09/2017 01/22/2017  Decreased Interest - 0 3 3 1   Down, Depressed, Hopeless 3 0 3 3 1   PHQ - 2 Score 3 0 6 6 2   Altered sleeping - - 3 3 3   Tired, decreased energy - - 3 3 3   Change in appetite - - 3 3 3   Feeling bad or failure about yourself  - - 3 3 3   Trouble concentrating - - 3 3 3   Moving slowly or fidgety/restless - - 2 2 2   Suicidal thoughts - - 0 0 0  PHQ-9 Score - - 23 23 19   Difficult doing work/chores - - Extremely dIfficult Extremely dIfficult Very difficult      Patient Active Problem List   Diagnosis Date Noted  . Chronic pain syndrome 03/17/2017  . Incomplete emptying of bladder 02/22/2017  . Prediabetes 12/31/2016  . Hypomagnesemia 12/27/2016  . Post concussion syndrome 10/06/2016  . 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 Fibromyalgia 10/05/2016  . Benign essential HTN 10/05/2016  . Hypothyroidism 10/05/2016  . Allergic rhinitis 10/05/2016  . Urticaria 10/05/2016  . Chronic fatigue syndrome  10/05/2016  . Asthma, chronic 10/05/2016  . HSV (herpes simplex virus) anogenital infection 10/05/2016  . Shingles 10/05/2016  . Anxiety and depression 10/05/2016  . Bilateral leg edema 10/05/2016  . Ovarian cyst 10/05/2016  . Nephrolithiasis 10/05/2016  . IBS (irritable bowel syndrome) 10/05/2016  . OSA (obstructive sleep apnea) 10/05/2016  . Left-sided  weakness 10/05/2016  . Migraine 10/05/2016  . BMI 45.0-49.9, adult (HCC) 10/05/2016  . DDD (degenerative disc disease), cervical   . Insomnia   . Right lateral epicondylitis 03/29/2015     Prior to Admission medications   Medication Sig Start Date End Date Taking? Authorizing Provider  acetaminophen (TYLENOL) 500 MG tablet Take 500-1,000 mg by mouth 2 (two) times daily as needed for mild pain.   Yes [provider]  acyclovir (ZOVIRAX) 200 MG capsule Take 1 capsule (200 mg total) by mouth 2 (two) times daily. 10/05/16  Yes Caedan Sumler, PA-C  albuterol (PROVENTIL HFA;VENTOLIN HFA) 108 (90 Base) MCG/ACT inhaler Inhale 1-2 puffs into the lungs every 6 (six) hours as needed for wheezing or shortness of breath. Patient taking differently: Inhale 2 puffs into the lungs every 6 (six) hours as needed for wheezing or shortness of breath.  10/05/16  Yes Andray Assefa, PA-C  amitriptyline (ELAVIL) 75 MG tablet Take 1 tablet (75 mg total) by mouth at bedtime. 12/27/16  Yes Floreen Teegarden, PA-C  amLODipine (NORVASC) 5 MG tablet Take 1 tablet (5 mg total) by mouth daily. 06/24/17  Yes Aniella Wandrey, PA-C  ARIPiprazole (ABILIFY) 10 MG tablet Take 1 tablet (10 mg total) by mouth daily. 10/05/16  Yes , Slevin Gunby, PA-C  azelastine (ASTELIN) 0.1 % nasal spray USE 2 SPRAYS EACH NOSTRIL TWICE DAILY. 06/27/17  Yes Zakiah Beckerman, PA-C  budesonide-formoterol (SYMBICORT) 160-4.5 MCG/ACT inhaler Inhale 2 puffs into the lungs 2 (two) times daily. 12/27/16  Yes , Mennie Spiller, PA-C  diazepam (VALIUM) 5 MG tablet TAKE 1 TABLET EVERY 8 HOURS AS NEEDED FOR ANXIETY. 07/30/17  Yes Karry Causer, PA-C  diphenhydrAMINE (BENADRYL) 25 mg capsule Take 25 mg by mouth 2 (two) times daily.   Yes [provider]  doxycycline (VIBRAMYCIN) 100 MG capsule Take 1 capsule (100 mg total) by mouth 2 (two) times daily. 05/29/17  Yes Pearley Millington, PA-C  fluticasone (FLONASE) 50 MCG/ACT nasal spray USE 1 SPRAY IN EACH  NOSTRIL DAILY. 04/02/17  Yes Payzlee Ryder, PA-C  furosemide (LASIX) 40 MG tablet Take 1 tablet (40 mg total) by mouth 2 (two) times daily. 10/05/16  Yes Tiffaney Heimann, PA-C  glucose blood test strip Use as instructed 04/17/17  Yes Poppy Mcafee, PA-C  hydrOXYzine (ATARAX/VISTARIL) 10 MG tablet Take 1 tablet (10 mg total) by mouth at bedtime. Patient taking differently: Take 10 mg by mouth at bedtime as needed for itching (angioedema).  10/05/16  Yes Porfirio Oar, , PA-C  Incontinence Supply Disposable (PREVAIL WASHCLOTHS) MISC Use daily as directed 06/03/17  Yes Onie Hayashi, PA-C  ipratropium-albuterol (DUONEB) 0.5-2.5 (3) MG/3ML SOLN Take 3 mLs by nebulization every 6 (six) hours as needed (for breathing). 10/05/16  Yes Porfirio Oar, , PA-C  Lancets Thin MISC Use to check home glucose daily 03/28/17  Yes Terrelle Ruffolo, PA-C  levothyroxine (SYNTHROID, LEVOTHROID) 25 MCG tablet Take 1 tablet (25 mcg total) by mouth daily before breakfast. Patient taking differently: Take 25 mcg by mouth daily.  10/05/16  Yes Brenen Beigel, PA-C  losartan (COZAAR) 100 MG tablet Take 1 tablet (100 mg total) by mouth daily. 10/05/16  Yes Daleiza Bacchi, PA-C  meloxicam (  MOBIC) 15 MG tablet TAKE 1 TABLET EACH DAY. 07/30/17  Yes Keilani Terrance, PA-C  methocarbamol (ROBAXIN) 500 MG tablet Take 2 tablets (1,000 mg total) by mouth every 8 (eight) hours as needed for muscle spasms. 02/01/17  Yes Loren Racer, MD  metoprolol (LOPRESSOR) 100 MG tablet Take 1 tablet (100 mg total) by mouth 2 (two) times daily. 10/05/16  Yes Porfirio Oar, PA-C  Nerve Stimulator (PRO COMFORT TENS UNIT) DEVI 1 Device by Does not apply route daily as needed. 12/27/16  Yes Rayli Wiederhold, PA-C  nystatin (MYCOSTATIN/NYSTOP) powder Apply topically 2 (two) times daily. Patient taking differently: Apply 8 g topically 2 (two) times daily.  12/27/16 12/27/17 Yes Jalena Vanderlinden, PA-C  nystatin cream (MYCOSTATIN) Apply 1 application topically 2  (two) times daily.   Yes [provider]  ondansetron (ZOFRAN) 4 MG tablet Take 1 tablet (4 mg total) by mouth every 8 (eight) hours as needed for nausea or vomiting. 02/01/17  Yes Loren Racer, MD  oxyCODONE (OXY IR/ROXICODONE) 5 MG immediate release tablet Take 1 tablet (5 mg total) by mouth every 4 (four) hours as needed for severe pain. 12/27/16  Yes Drey Shaff, PA-C  potassium chloride SA (K-DUR,KLOR-CON) 20 MEQ tablet Take 1 tablet (20 mEq total) by mouth 2 (two) times daily. 06/24/17  Yes Alvin Rubano, PA-C  predniSONE (DELTASONE) 20 MG tablet Take 3 PO QAM x3days, 2 PO QAM x3days, 1 PO QAM x3days 05/29/17  Yes Sheridan Gettel, PA-C  Probiotic Product (ALIGN) 4 MG CAPS Take 4 mg by mouth daily.   Yes [provider]  sertraline (ZOLOFT) 100 MG tablet Take 1 tablet (100 mg total) by mouth daily. 10/05/16  Yes Ayline Dingus, PA-C  traMADol (ULTRAM) 50 MG tablet TAKE 1 OR 2 TABLETS EVERY 8 HOURS AS NEEDED. 07/30/17  Yes Seith Aikey, PA-C  triamcinolone ointment (KENALOG) 0.5 % Apply 1 application topically 2 (two) times daily.   Yes [provider]  trolamine salicylate (ASPERCREME) 10 % cream Apply 1 application topically at bedtime as needed for muscle pain.   Yes [provider]     Allergies  Allergen Reactions  . Sulfa Antibiotics Anaphylaxis  . Iodinated Diagnostic Agents Other (See Comments)    Patient complained of throat/ uvula swelling s/p injection on 02/01/2017 but states she isnt allergic to the dye and has had several times prior   . Codeine Other (See Comments)    Unknown - patient said mother told her she was allergic as a kid  . Dilaudid [Hydromorphone Hcl] Itching  . Other     Other reaction(s): Info Not Available  . Penicillins Other (See Comments)    Has patient had a PCN reaction causing immediate rash, facial/tongue/throat swelling, SOB or lightheadedness with hypotension: Unknown Has patient had a PCN reaction causing  severe rash involving mucus membranes or skin necrosis: Unknown Has patient had a PCN reaction that required hospitalization: Unknown Has patient had a PCN reaction occurring within the last 10 years: Unknown If all of the above answers are "NO", then may proceed with Cephalosporin use.  Had a reaction when she was young.   Marland Kitchen Spironolactone Other (See Comments)    "GI Issues"       Objective:  Physical Exam  Constitutional: She is oriented to person, place, and time. She appears well-developed and well-nourished. She is active and cooperative. No distress.  BP 125/84   Pulse 95   Temp 98.4 F (36.9 C)   Resp 16   Ht  5\' 9"  (1.753 m)   Wt (!) 341 lb 6.4 oz (154.9 kg)   SpO2 98%   BMI 50.42 kg/m   HENT:  Head: Normocephalic and atraumatic.  Right Ear: Hearing normal.  Left Ear: Hearing normal.  Eyes: Conjunctivae are normal. No scleral icterus.  Neck: Normal range of motion. Neck supple. No thyromegaly present.  Cardiovascular: Normal rate, regular rhythm and normal heart sounds.  Pulses:      Radial pulses are 2+ on the right side, and 2+ on the left side.  Pulmonary/Chest: Effort normal and breath sounds normal.  Musculoskeletal:       Right shoulder: She exhibits decreased range of motion and tenderness (exquisite tenderness to very light touch).       Right wrist: She exhibits decreased range of motion and tenderness. She exhibits no bony tenderness.       Left wrist: She exhibits decreased range of motion and tenderness.       Right hand: She exhibits decreased range of motion and tenderness. She exhibits normal capillary refill, no deformity and no swelling. Normal sensation noted. Normal strength noted.       Left hand: She exhibits decreased range of motion and tenderness. She exhibits normal capillary refill, no deformity and no swelling. Normal sensation noted. Normal strength noted.  Lymphadenopathy:       Head (right side): No tonsillar, no preauricular, no  posterior auricular and no occipital adenopathy present.       Head (left side): No tonsillar, no preauricular, no posterior auricular and no occipital adenopathy present.    She has no cervical adenopathy.       Right: No supraclavicular adenopathy present.       Left: No supraclavicular adenopathy present.  Neurological: She is alert and oriented to person, place, and time. No sensory deficit.  Skin: Skin is warm, dry and intact. No rash noted. No cyanosis or erythema. Nails show no clubbing.  Psychiatric: Her speech is normal and behavior is normal. Judgment and thought content normal. Her mood appears not anxious. Her affect is labile. Her affect is not angry, not blunt and not inappropriate. Cognition and memory are normal. She exhibits a depressed mood.     Wt Readings from Last 3 Encounters:  08/09/17 (!) 341 lb 6.4 oz (154.9 kg)  05/29/17 (!) 341 lb (154.7 kg)  04/27/17 (!) 338 lb 6.4 oz (153.5 kg)       Assessment & Plan:   Problem List Items Addressed This Visit    Fibromyalgia    Unable to affords rheumatology. COntinue current medication regimen. Increased exercise could help.      Relevant Medications   oxyCODONE (OXY IR/ROXICODONE) 5 MG immediate release tablet   amitriptyline (ELAVIL) 75 MG tablet   meloxicam (MOBIC) 15 MG tablet   Benign essential HTN - Primary    Controlled here in the office. Continue current treatment.  Collect urine metanephrines to evaluate for pheochromocytoma.      Relevant Medications   furosemide (LASIX) 40 MG tablet   losartan (COZAAR) 100 MG tablet   metoprolol tartrate (LOPRESSOR) 100 MG tablet   Other Relevant Orders   Comprehensive metabolic panel (Completed)   CBC with Differential/Platelet (Completed)   B12 and Folate Panel (Completed)   Metanephrines, urine, 24 hour (Completed)   Hypothyroidism   Relevant Medications   metoprolol tartrate (LOPRESSOR) 100 MG tablet   Other Relevant Orders   TSH (Completed)   Asthma,  chronic    Appears stable.  Relevant Medications   albuterol (PROVENTIL HFA;VENTOLIN HFA) 108 (90 Base) MCG/ACT inhaler   Anxiety and depression    Difficult situation given her physical disability and financial strain.  No changes today.      Relevant Medications   amitriptyline (ELAVIL) 75 MG tablet   Bilateral leg edema    This is chronic, previous workup has not identified a cause.  For some reason aquatic exercise seems to exacerbate the situation.  She takes furosemide 40 mg twice daily already.  Her reports of uncontrolled blood pressure at home suggest hypertension may play a role though vitals are controlled today.      Relevant Medications   furosemide (LASIX) 40 MG tablet   Prediabetes    Stressed the importance of healthier eating and getting more physical activity.  She is interested in assistance with lifestyle changes to prevent progression from prediabetes to diabetes.  Referral to medical weight management.      Relevant Orders   Hemoglobin A1c (Completed)   Comprehensive metabolic panel (Completed)    Other Visit Diagnoses    Encounter for screening mammogram for breast cancer       Relevant Orders   MM Digital Screening   Need for Tdap vaccination       Declined, not clear if her insurance will cover this.   Financial problems       We reviewed the [facility] "$4 list."  She was unaware of lower cost medications at [facility].  She can obtain many of her medications for $4 per month.   Acute right-sided low back pain without sciatica       Relevant Medications   oxyCODONE (OXY IR/ROXICODONE) 5 MG immediate release tablet   meloxicam (MOBIC) 15 MG tablet   Other Relevant Orders   Urine Culture (Completed)       Return in about 3 months (around 11/06/2017) for Reevaluation of blood pressure, prediabetes, etc.  However if she is seeing specialists postpone.   Fernande Bras, PA-C [facility] at [facility]

## 2017-08-09 NOTE — Patient Instructions (Addendum)
Prioritize the neurologist and the GI specialist.      IF you received an x-ray today, you will receive an invoice from [facility]. Please contact [facility] at [phone] with questions or concerns regarding your invoice.   IF you received labwork today, you will receive an invoice from [facility]. Please contact [facility] at [phone] with questions or concerns regarding your invoice.   Our billing staff will not be able to assist you with questions regarding bills from these companies.  You will be contacted with the lab results as soon as they are available. The fastest way to get your results is to activate your My Chart account. Instructions are located on the last page of this paperwork. If you have not heard from us regarding the results in 2 weeks, please contact this office.

## 2017-08-09 NOTE — Progress Notes (Signed)
Subjective:    Patient ID: Brianna Roberts, female    DOB: 1966/01/23, 52 y.o.   MRN: [id number]  HPI Patient is here for a check up of her blood pressure and fibromyalgia.  Patient Active Problem List   Diagnosis Date Noted  . Chronic pain syndrome 03/17/2017  . Incomplete emptying of bladder 02/22/2017  . Prediabetes 12/31/2016  . Hypomagnesemia 12/27/2016  . Post concussion syndrome 10/06/2016  . 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 Fibromyalgia 10/05/2016  . Benign essential HTN 10/05/2016  . Hypothyroidism 10/05/2016  . Allergic rhinitis 10/05/2016  . Urticaria 10/05/2016  . Chronic fatigue syndrome 10/05/2016  . Asthma, chronic 10/05/2016  . HSV (herpes simplex virus) anogenital infection 10/05/2016  . Shingles 10/05/2016  . Anxiety and depression 10/05/2016  . Bilateral leg edema 10/05/2016  . Ovarian cyst 10/05/2016  . Nephrolithiasis 10/05/2016  . IBS (irritable bowel syndrome) 10/05/2016  . OSA (obstructive sleep apnea) 10/05/2016  . Left-sided weakness 10/05/2016  . Migraine 10/05/2016  . BMI 45.0-49.9, adult (HCC) 10/05/2016  . DDD (degenerative disc disease), cervical   . Insomnia   . Right lateral epicondylitis 03/29/2015   Allergies  Allergen Reactions  . Sulfa Antibiotics Anaphylaxis  . Iodinated Diagnostic Agents Other (See Comments)    Patient complained of throat/ uvula swelling s/p injection on 02/01/2017 but states she isnt allergic to the dye and has had several times prior   . Codeine Other (See Comments)    Unknown - patient said mother told her she was allergic as a kid  . Dilaudid [Hydromorphone Hcl] Itching  . Other     Other reaction(s): Info Not Available  . Penicillins Other (See Comments)    Has patient had a PCN reaction causing immediate rash, facial/tongue/throat swelling, SOB or lightheadedness with hypotension: Unknown Has patient had a PCN reaction causing severe rash involving mucus membranes or skin  necrosis: Unknown Has patient had a PCN reaction that required hospitalization: Unknown Has patient had a PCN reaction occurring within the last 10 years: Unknown If all of the above answers are "NO", then may proceed with Cephalosporin use.  Had a reaction when she was young.   Marland Kitchen Spironolactone Other (See Comments)    "GI Issues"   Prior to Admission medications   Medication Sig Start Date End Date Taking? Authorizing Provider  acyclovir (ZOVIRAX) 200 MG capsule Take 1 capsule (200 mg total) by mouth 2 (two) times daily. 10/05/16  Yes Jeffery, Chelle, PA-C  albuterol (PROVENTIL HFA;VENTOLIN HFA) 108 (90 Base) MCG/ACT inhaler Inhale 1-2 puffs into the lungs every 6 (six) hours as needed for wheezing or shortness of breath. Patient taking differently: Inhale 2 puffs into the lungs every 6 (six) hours as needed for wheezing or shortness of breath.  10/05/16  Yes Jeffery, Chelle, PA-C  amitriptyline (ELAVIL) 75 MG tablet Take 1 tablet (75 mg total) by mouth at bedtime. 12/27/16  Yes Jeffery, Chelle, PA-C  amLODipine (NORVASC) 5 MG tablet Take 1 tablet (5 mg total) by mouth daily. 06/24/17  Yes Jeffery, Chelle, PA-C  azelastine (ASTELIN) 0.1 % nasal spray USE 2 SPRAYS EACH NOSTRIL TWICE DAILY. 06/27/17  Yes Jeffery, Chelle, PA-C  diazepam (VALIUM) 5 MG tablet TAKE 1 TABLET EVERY 8 HOURS AS NEEDED FOR ANXIETY. 07/30/17  Yes Jeffery, Chelle, PA-C  diphenhydrAMINE (BENADRYL) 25 mg capsule Take 25 mg by mouth 2 (two) times daily.   Yes [provider]  fluticasone (FLONASE) 50 MCG/ACT nasal spray USE 1 SPRAY  IN EACH NOSTRIL DAILY. 04/02/17  Yes Jeffery, Chelle, PA-C  furosemide (LASIX) 40 MG tablet Take 1 tablet (40 mg total) by mouth 2 (two) times daily. 10/05/16  Yes Jeffery, Chelle, PA-C  glucose blood test strip Use as instructed 04/17/17  Yes Jeffery, Chelle, PA-C  hydrOXYzine (ATARAX/VISTARIL) 10 MG tablet Take 1 tablet (10 mg total) by mouth at bedtime. Patient taking differently: Take 10 mg by  mouth at bedtime as needed for itching (angioedema).  10/05/16  Yes Porfirio Oar, PA-C  Incontinence Supply Disposable (PREVAIL WASHCLOTHS) MISC Use daily as directed 06/03/17  Yes Jeffery, Chelle, PA-C  ipratropium-albuterol (DUONEB) 0.5-2.5 (3) MG/3ML SOLN Take 3 mLs by nebulization every 6 (six) hours as needed (for breathing). 10/05/16  Yes Porfirio Oar, PA-C  Lancets Thin MISC Use to check home glucose daily 03/28/17  Yes Jeffery, Chelle, PA-C  levothyroxine (SYNTHROID, LEVOTHROID) 25 MCG tablet Take 1 tablet (25 mcg total) by mouth daily before breakfast. Patient taking differently: Take 25 mcg by mouth daily.  10/05/16  Yes Jeffery, Chelle, PA-C  losartan (COZAAR) 100 MG tablet Take 1 tablet (100 mg total) by mouth daily. 10/05/16  Yes Jeffery, Chelle, PA-C  meloxicam (MOBIC) 15 MG tablet TAKE 1 TABLET EACH DAY. 07/30/17  Yes Jeffery, Chelle, PA-C  methocarbamol (ROBAXIN) 500 MG tablet Take 2 tablets (1,000 mg total) by mouth every 8 (eight) hours as needed for muscle spasms. 02/01/17  Yes Loren Racer, MD  metoprolol (LOPRESSOR) 100 MG tablet Take 1 tablet (100 mg total) by mouth 2 (two) times daily. 10/05/16  Yes Porfirio Oar, PA-C  Nerve Stimulator (PRO COMFORT TENS UNIT) DEVI 1 Device by Does not apply route daily as needed. 12/27/16  Yes Jeffery, Chelle, PA-C  nystatin (MYCOSTATIN/NYSTOP) powder Apply topically 2 (two) times daily. Patient taking differently: Apply 8 g topically 2 (two) times daily.  12/27/16 12/27/17 Yes Jeffery, Chelle, PA-C  nystatin cream (MYCOSTATIN) Apply 1 application topically 2 (two) times daily.   Yes [provider]  oxyCODONE (OXY IR/ROXICODONE) 5 MG immediate release tablet Take 1 tablet (5 mg total) by mouth every 4 (four) hours as needed for severe pain. 12/27/16  Yes Jeffery, Chelle, PA-C  potassium chloride SA (K-DUR,KLOR-CON) 20 MEQ tablet Take 1 tablet (20 mEq total) by mouth 2 (two) times daily. 06/24/17  Yes Jeffery, Chelle, PA-C  sertraline  (ZOLOFT) 100 MG tablet Take 1 tablet (100 mg total) by mouth daily. 10/05/16  Yes Jeffery, Chelle, PA-C  traMADol (ULTRAM) 50 MG tablet TAKE 1 OR 2 TABLETS EVERY 8 HOURS AS NEEDED. 07/30/17  Yes Jeffery, Chelle, PA-C  triamcinolone ointment (KENALOG) 0.5 % Apply 1 application topically 2 (two) times daily.   Yes [provider]  trolamine salicylate (ASPERCREME) 10 % cream Apply 1 application topically at bedtime as needed for muscle pain.   Yes [provider]  vitamin B-12 (CYANOCOBALAMIN) 100 MCG tablet Take 100 mcg by mouth daily.   Yes [provider]  ARIPiprazole (ABILIFY) 10 MG tablet Take 1 tablet (10 mg total) by mouth daily. Patient not taking: Reported on 08/09/2017 10/05/16   Porfirio Oar, PA-C  budesonide-formoterol (SYMBICORT) 160-4.5 MCG/ACT inhaler Inhale 2 puffs into the lungs 2 (two) times daily. Patient not taking: Reported on 08/09/2017 12/27/16   Porfirio Oar, PA-C  ondansetron (ZOFRAN) 4 MG tablet Take 1 tablet (4 mg total) by mouth every 8 (eight) hours as needed for nausea or vomiting. Patient not taking: Reported on 08/09/2017 02/01/17   Loren Racer, MD   Social  History   Socioeconomic History  . Marital status: Widowed    Spouse name: Newman Pies  . Number of children: 0  . Years of education: college+  . Highest education level: Not on file  Social Needs  . Financial resource strain: Not on file  . Food insecurity - worry: Not on file  . Food insecurity - inability: Not on file  . Transportation needs - medical: Not on file  . Transportation needs - non-medical: Not on file  Occupational History  . Occupation: disability    Comment: fibromyalgia  Tobacco Use  . Smoking status: Current Every Day Smoker    Packs/day: 0.30    Years: 33.00    Pack years: 9.90    Types: Cigarettes  . Smokeless tobacco: Never Used  Substance and Sexual Activity  . Alcohol use: No  . Drug use: No  . Sexual activity: No  Other Topics  Concern  . Not on file  Social History Narrative   Moved to [place], [place] from [place] 04/28/2016 in need of a change.   Her family remains in [place].   Lives alone.   Family History  Problem Relation Age of Onset  . Cancer Mother 71       breast cancer  . Diabetes Mother   . Heart disease Mother   . Hyperlipidemia Mother   . Hypertension Mother   . Stroke Mother   . Mental illness Mother   . Heart failure Mother   . Headache Mother        Joellyn Quails Cyst  . Heart disease Father   . Alcohol abuse Father   . Hypertension Sister   . Heart disease Brother   . Mental illness Brother   . COPD Brother   . Heart failure Brother   . Hypertension Sister    Past Surgical History:  Procedure Laterality Date  . ABDOMINAL HYSTERECTOMY     Partial-ovaries and tubes remain  . CHOLECYSTECTOMY    . HERNIA REPAIR  1997  . TUMOR EXCISION Right 1984   Sinuses  . TUMOR EXCISION Left 2015   sinuses  . URACHAL CYST EXCISION  1997    Review of Systems  Constitutional: Positive for fatigue.  HENT: Negative.   Eyes: Negative.   Respiratory: Negative.   Cardiovascular: Negative.   Gastrointestinal: Positive for diarrhea. Negative for abdominal distention, abdominal pain and constipation.  Endocrine: Negative.   Genitourinary: Negative.   Musculoskeletal: Positive for arthralgias, back pain, joint swelling and myalgias. Negative for gait problem.  Allergic/Immunologic: Negative.   Neurological: Positive for dizziness and light-headedness. Negative for tremors, seizures, syncope, facial asymmetry, speech difficulty, numbness and headaches.  Hematological: Bruises/bleeds easily (Tongue bleeding randomly occured.).  Psychiatric/Behavioral: Negative for dysphoric mood.       Objective:   Physical Exam  Constitutional: She is oriented to person, place, and time. She appears well-developed.  Obese. BP 125/84   Pulse 95   Temp 98.4 F (36.9 C)   Resp 16   Ht 5\' 9"   (1.753 m)   Wt (!) 341 lb 6.4 oz (154.9 kg)   SpO2 98%   BMI 50.42 kg/m   HENT:  Head: Normocephalic and atraumatic.  Right Ear: External ear normal.  Left Ear: External ear normal.  Nose: Nose normal.  Mouth/Throat: Oropharynx is clear and moist.  Eyes: Conjunctivae and EOM are normal. Pupils are equal, round, and reactive to light.  Neck: Normal range of motion. Neck supple.  Cardiovascular: Normal rate, regular  rhythm, normal heart sounds and intact distal pulses.  Pulmonary/Chest: Effort normal and breath sounds normal.  Abdominal: Soft. Bowel sounds are normal.  Musculoskeletal: Normal range of motion. She exhibits edema (Edematous hands and feet.) and tenderness (Tenderness upon palpation of wrists, right scapula.).  Neurological: She is alert and oriented to person, place, and time. She has normal reflexes.  Skin: Skin is warm and dry.  Psychiatric: She has a normal mood and affect.        Assessment & Plan:   1. Benign essential HTN - Comprehensive metabolic panel - CBC with Differential/Platelet - B12 and Folate Panel - Metanephrines, urine, 24 hour; Future - amLODipine (NORVASC) 5 MG tablet; Take 1 tablet (5 mg total) by mouth daily.  Dispense: 90 tablet; Refill: 3 - losartan (COZAAR) 100 MG tablet; Take 1 tablet (100 mg total) by mouth daily.  Dispense: 90 tablet; Refill: 3 - metoprolol tartrate (LOPRESSOR) 100 MG tablet; Take 1 tablet (100 mg total) by mouth 2 (two) times daily.  Dispense: 180 tablet; Refill: 3  2. Hypothyroidism, unspecified type - TSH - levothyroxine (SYNTHROID, LEVOTHROID) 25 MCG tablet; Take 1 tablet (25 mcg total) by mouth daily before breakfast.  Dispense: 90 tablet; Refill: 3  3. Prediabetes - Hemoglobin A1c - Comprehensive metabolic panel -Foot exam normal  4. Anxiety and depression Patient has stopped taking Zoloft and is doing well off of it. All other medications are working optimally for her mental health at this time.  5.  Encounter for screening mammogram for breast cancer - MM Digital Screening; Future  6. Need for Tdap vaccination Medicare does not cover, held off until later date.  7. Financial problems No solution to this at this time as patient is on disability for fibromyalgia.  8. Fibromyalgia - oxyCODONE (OXY IR/ROXICODONE) 5 MG immediate release tablet; Take 1 tablet (5 mg total) by mouth every 4 (four) hours as needed for severe pain.  Dispense: 30 tablet; Refill: 0 - amitriptyline (ELAVIL) 75 MG tablet; Take 1 tablet (75 mg total) by mouth at bedtime.  Dispense: 90 tablet; Refill: 3 - meloxicam (MOBIC) 15 MG tablet; TAKE 1 TABLET EACH DAY.  Dispense: 90 tablet; Refill: 0  9. Bilateral leg edema -Apparent on physical exam. - furosemide (LASIX) 40 MG tablet; Take 1 tablet (40 mg total) by mouth 2 (two) times daily.  Dispense: 180 tablet; Refill: 3  10. Chronic asthma without complication, unspecified asthma severity, unspecified whether persistent - albuterol (PROVENTIL HFA;VENTOLIN HFA) 108 (90 Base) MCG/ACT inhaler; Inhale 1-2 puffs into the lungs every 6 (six) hours as needed for wheezing or shortness of breath.  Dispense: 1 Inhaler; Refill: 3  11. Acute right-sided low back pain without sciatica - Urine Culture  Follow up after appointments with neurology and GI specialists, if needed. Otherwise follow up in three months to evaluate TSH, anxiety/depression, fibromyalgia and other chronic issues.

## 2017-08-10 LAB — CBC WITH DIFFERENTIAL/PLATELET
Basophils Absolute: 0.1 10*3/uL (ref 0.0–0.2)
Basos: 1 %
EOS (ABSOLUTE): 0.2 10*3/uL (ref 0.0–0.4)
Eos: 2 %
Hematocrit: 41 % (ref 34.0–46.6)
Hemoglobin: 13.5 g/dL (ref 11.1–15.9)
Immature Grans (Abs): 0 10*3/uL (ref 0.0–0.1)
Immature Granulocytes: 0 %
Lymphocytes Absolute: 3.7 10*3/uL — ABNORMAL HIGH (ref 0.7–3.1)
Lymphs: 38 %
MCH: 26.7 pg (ref 26.6–33.0)
MCHC: 32.9 g/dL (ref 31.5–35.7)
MCV: 81 fL (ref 79–97)
Monocytes Absolute: 0.5 10*3/uL (ref 0.1–0.9)
Monocytes: 5 %
Neutrophils Absolute: 5.3 10*3/uL (ref 1.4–7.0)
Neutrophils: 54 %
Platelets: 310 10*3/uL (ref 150–379)
RBC: 5.06 x10E6/uL (ref 3.77–5.28)
RDW: 14.8 % (ref 12.3–15.4)
WBC: 9.6 10*3/uL (ref 3.4–10.8)

## 2017-08-10 LAB — B12 AND FOLATE PANEL
Folate: >20 ng/mL (ref >3.0)
Vitamin B-12: 626 pg/mL (ref 232–1245)

## 2017-08-10 LAB — COMPREHENSIVE METABOLIC PANEL
A/G RATIO: 1.4 (ref 1.2–2.2)
ALBUMIN: 4.2 g/dL (ref 3.5–5.5)
ALT: 17 IU/L (ref 0–32)
AST: 13 IU/L (ref 0–40)
Alkaline Phosphatase: 72 IU/L (ref 39–117)
BILIRUBIN TOTAL: 0.5 mg/dL (ref 0.0–1.2)
BUN / CREAT RATIO: 9 (ref 9–23)
BUN: 8 mg/dL (ref 6–24)
CO2: 23 mmol/L (ref 20–29)
Calcium: 9.5 mg/dL (ref 8.7–10.2)
Chloride: 100 mmol/L (ref 96–106)
Creatinine, Ser: 0.88 mg/dL (ref 0.57–1.00)
GFR calc non Af Amer: 76 mL/min/{1.73_m2} (ref >59)
GFR, EST AFRICAN AMERICAN: 88 mL/min/{1.73_m2} (ref >59)
GLOBULIN, TOTAL: 2.9 g/dL (ref 1.5–4.5)
GLUCOSE: 97 mg/dL (ref 65–99)
POTASSIUM: 4.2 mmol/L (ref 3.5–5.2)
SODIUM: 140 mmol/L (ref 134–144)
TOTAL PROTEIN: 7.1 g/dL (ref 6.0–8.5)

## 2017-08-10 LAB — TSH: TSH: 4.9 u[IU]/mL — ABNORMAL HIGH (ref 0.450–4.500)

## 2017-08-10 LAB — HEMOGLOBIN A1C
Est. average glucose Bld gHb Est-mCnc: 128 mg/dL
Hgb A1c MFr Bld: 6.1 % — ABNORMAL HIGH (ref 4.8–5.6)

## 2017-08-14 DIAGNOSIS — I10 Essential (primary) hypertension: Secondary | ICD-10-CM | POA: Diagnosis not present

## 2017-08-14 DIAGNOSIS — M545 Low back pain: Secondary | ICD-10-CM | POA: Diagnosis not present

## 2017-08-14 DIAGNOSIS — R7303 Prediabetes: Secondary | ICD-10-CM | POA: Diagnosis not present

## 2017-08-15 ENCOUNTER — Encounter: Payer: Self-pay | Admitting: Physician Assistant

## 2017-08-15 DIAGNOSIS — E039 Hypothyroidism, unspecified: Secondary | ICD-10-CM

## 2017-08-16 LAB — URINE CULTURE

## 2017-08-16 LAB — METANEPHRINES, URINE, 24 HOUR
METANEPH TOTAL UR: 55 ug/L
METANEPHRINES 24H UR: 55 ug/(24.h) (ref 45–290)
Normetanephrine, 24H Ur: 729 ug/24 hr — ABNORMAL HIGH (ref 82–500)
Normetanephrine, Ur: 729 ug/L

## 2017-08-17 MED ORDER — LEVOTHYROXINE SODIUM 50 MCG PO TABS: 50.0000 ug | ORAL_TABLET | Freq: Every day | ORAL | 3 refills | Status: DC

## 2017-08-19 ENCOUNTER — Encounter: Payer: Self-pay | Admitting: Physician Assistant

## 2017-08-19 DIAGNOSIS — I10 Essential (primary) hypertension: Secondary | ICD-10-CM

## 2017-08-21 ENCOUNTER — Encounter (INDEPENDENT_AMBULATORY_CARE_PROVIDER_SITE_OTHER): Payer: Medicare HMO

## 2017-08-21 MED ORDER — AMLODIPINE BESYLATE 10 MG PO TABS: 10.0000 mg | ORAL_TABLET | Freq: Every day | ORAL | 3 refills | Status: DC

## 2017-08-21 NOTE — Assessment & Plan Note (Addendum)
Controlled here in the office. Continue current treatment.  Collect urine metanephrines to evaluate for pheochromocytoma.

## 2017-08-21 NOTE — Assessment & Plan Note (Signed)
Stressed the importance of healthier eating and getting more physical activity.  She is interested in assistance with lifestyle changes to prevent progression from prediabetes to diabetes.  Referral to medical weight management.

## 2017-08-21 NOTE — Assessment & Plan Note (Signed)
Unable to affords rheumatology. COntinue current medication regimen. Increased exercise could help.

## 2017-08-21 NOTE — Assessment & Plan Note (Signed)
This is chronic, previous workup has not identified a cause.  For some reason aquatic exercise seems to exacerbate the situation.  She takes furosemide 40 mg twice daily already.  Her reports of uncontrolled blood pressure at home suggest hypertension may play a role though vitals are controlled today.

## 2017-08-21 NOTE — Assessment & Plan Note (Signed)
Appears stable 

## 2017-08-21 NOTE — Assessment & Plan Note (Signed)
Difficult situation given her physical disability and financial strain.  No changes today.

## 2017-08-24 NOTE — Telephone Encounter (Signed)
See patient's message about her prescriptions. I'm [facility]? Please resend.

## 2017-08-28 ENCOUNTER — Ambulatory Visit (INDEPENDENT_AMBULATORY_CARE_PROVIDER_SITE_OTHER): Payer: Medicare HMO | Admitting: Family Medicine

## 2017-08-29 ENCOUNTER — Telehealth: Payer: Self-pay | Admitting: Physician Assistant

## 2017-08-29 NOTE — Telephone Encounter (Signed)
Copied from CRM [phone]. Topic: Quick Communication - Rx Refill/Question >> Aug 29, 2017 10:37 AM Louie BunPalacios Medina, [name] wrote: Medication: amLODipine (NORVASC) 10 MG tablet, levothyroxine (SYNTHROID, LEVOTHROID) 50 MCG tablet, oxyCODONE (OXY IR/ROXICODONE) 5 MG immediate release tablet, diazepam (VALIUM) 5 MG tablet and albuterol (PROVENTIL HFA;VENTOLIN HFA) 108 (90 Base) MCG/ACT inhaler,azelastine (ASTELIN) 0.1 % nasal spray, amitriptyline (ELAVIL) 75 MG tablet   Has the patient contacted their pharmacy? Yes   (Agent: If no, request that the patient contact the pharmacy for the refill.)   Preferred Pharmacy (with phone number or street name):[facility] [address] - [place], [place] - [address]   Agent: Please be advised that RX refills may take up to 3 business days. We ask that you follow-up with your pharmacy.

## 2017-08-29 NOTE — Telephone Encounter (Signed)
This was request was included in a previous message;   Last OV 08/09/17 PCP: [facility]

## 2017-08-29 NOTE — Telephone Encounter (Signed)
Last office visit 08/09/17;  PCP with Theora Gianottihelle Jeffrey Preferred Pharm[facility] : [facility] [address] for the medications on the $4.00 list                                       [facility] for the other oral medications                                        [facility] for TENS unit supplies   I spoke with the pt and she told me what she needs below:  Pt. Needs Azelastine 0.1 % nasal spray @ [facility] Pt. Needs Oxycodone 5 mg IR @ [facility] Pt. 4 x 4 electrodes (2) and Large Butterfly (2) [facility]

## 2017-08-29 NOTE — Telephone Encounter (Signed)
Copied from CRM [phone]. Topic: Quick Communication - Rx Refill/Question >> Aug 29, 2017 10:43 AM Brianna Roberts, [name] wrote: Medication: biostime for her Nerve Stimulator unit 4x4 size and the butterfly size Has the patient contacted their pharmacy? Yes (Agent: If no, request that the patient contact the pharmacy for the refill.) Preferred Pharmacy (with phone number or street name): [facility] [place] - [address] Agent: Please be advised that RX refills may take up to 3 business days. We ask that you follow-up with your pharmacy.

## 2017-08-30 ENCOUNTER — Telehealth: Payer: Self-pay

## 2017-08-30 DIAGNOSIS — M797 Fibromyalgia: Secondary | ICD-10-CM

## 2017-08-30 MED ORDER — OXYCODONE HCL 5 MG PO TABS: 5.0000 mg | ORAL_TABLET | ORAL | 0 refills | Status: DC | PRN

## 2017-08-30 NOTE — Telephone Encounter (Signed)
Spoke with pt advised no uti per mychart and if she had a uti I know Chelle wouldn't have let her leave without knowing and talking with her about the antibiotic she was subscribing.  Also Rx was filled on the 08/09/2017 and to [facility] to see if it was sent there.  Pt advises it would've expired- I advised it would not they would put it back in stock.  Call and see if this is the case with the oxycodone and if so just ask them to re-fill it.  Pt agreeable and will [facility].  Advised to call office if she needs further assistance with this.  Pt agreeable. Dgaddy, CMA

## 2017-08-30 NOTE — Telephone Encounter (Signed)
Left message on voicemail to return office call.  Reason for call was to advise Brianna Roberts sent new rx for [facility]codone t5 mg [facility]. Dgaddy, CMA

## 2017-08-30 NOTE — Telephone Encounter (Signed)
Pt calling back to advise [facility]'t receiver her oxycodone rx electronically.  I advised Chelle is out of town and Valarie ConesWeber is covering her so I will speak with her to help resolve this issue and to try and find out where it was sent or if by mistake Chelle forgot and I will give her a call back.  Pt agreeable. Dgaddy, CMA

## 2017-08-30 NOTE — Telephone Encounter (Signed)
Check [place] drug database she has not had it filled since 7/18.  I will refill today as [facility].

## 2017-08-30 NOTE — Telephone Encounter (Signed)
Patient is calling in and states her medications are mixed up. She states that [facility] told her today 08/30/17 that there was a antibiotic for her in regards to a UTI. Patient states she was not informed by anyone that she had a UTI and she is very upset with that. She states she requested Oxycodone and that it was never sent in. She [facility] Please contact patient.   CB# [phone]

## 2017-08-31 DIAGNOSIS — M797 Fibromyalgia: Secondary | ICD-10-CM | POA: Diagnosis not present

## 2017-09-21 DIAGNOSIS — R69 Illness, unspecified: Secondary | ICD-10-CM | POA: Diagnosis not present

## 2017-09-27 ENCOUNTER — Encounter: Payer: Self-pay | Admitting: Gastroenterology

## 2017-09-30 ENCOUNTER — Encounter: Payer: Self-pay | Admitting: Physician Assistant

## 2017-10-01 DIAGNOSIS — M797 Fibromyalgia: Secondary | ICD-10-CM | POA: Diagnosis not present

## 2017-10-11 NOTE — Telephone Encounter (Signed)
Please advise 

## 2017-10-11 NOTE — Telephone Encounter (Signed)
This message, forwarded to me today, 10/11/2017, is from 08/29/2017. I suspect that it has been addressed. Please contact the patient and clarify.

## 2017-10-12 ENCOUNTER — Other Ambulatory Visit: Payer: Self-pay | Admitting: Physician Assistant

## 2017-10-12 DIAGNOSIS — F32A Depression, unspecified: Secondary | ICD-10-CM

## 2017-10-12 DIAGNOSIS — J019 Acute sinusitis, unspecified: Secondary | ICD-10-CM

## 2017-10-12 DIAGNOSIS — F419 Anxiety disorder, unspecified: Secondary | ICD-10-CM

## 2017-10-12 DIAGNOSIS — M797 Fibromyalgia: Secondary | ICD-10-CM

## 2017-10-12 DIAGNOSIS — F329 Major depressive disorder, single episode, unspecified: Secondary | ICD-10-CM

## 2017-10-14 ENCOUNTER — Other Ambulatory Visit: Payer: Self-pay

## 2017-10-14 ENCOUNTER — Telehealth: Payer: Self-pay | Admitting: Physician Assistant

## 2017-10-14 DIAGNOSIS — J019 Acute sinusitis, unspecified: Secondary | ICD-10-CM

## 2017-10-14 MED ORDER — AZELASTINE HCL 0.1 % NA SOLN: NASAL | 0 refills | Status: DC

## 2017-10-14 MED ORDER — DIAZEPAM 5 MG PO TABS: 5.0000 mg | ORAL_TABLET | Freq: Three times a day (TID) | ORAL | 0 refills | Status: DC | PRN

## 2017-10-14 MED ORDER — TRAMADOL HCL 50 MG PO TABS: 50.0000 mg | ORAL_TABLET | Freq: Three times a day (TID) | ORAL | 0 refills | Status: DC | PRN

## 2017-10-14 NOTE — Telephone Encounter (Signed)
Patient is requesting a refill of the following medications: Requested Prescriptions   Pending Prescriptions Disp Refills  . traMADol (ULTRAM) 50 MG tablet 90 tablet 0  . diazepam (VALIUM) 5 MG tablet 90 tablet 0    Date of patient request: 10/12/17 Last office visit: 08/09/17 Date of last refill: 07/30/17 Last refill amount: #90 0RF  Follow up time period per chart: 11/13/17

## 2017-10-14 NOTE — Telephone Encounter (Signed)
Rx sent electronically.  Meds ordered this encounter  Medications  . traMADol (ULTRAM) 50 MG tablet    Sig: Take 1 tablet (50 mg total) by mouth 3 (three) times daily as needed.    Dispense:  90 tablet    Refill:  0  . diazepam (VALIUM) 5 MG tablet    Sig: Take 1 tablet (5 mg total) by mouth every 8 (eight) hours as needed for anxiety.    Dispense:  90 tablet    Refill:  0

## 2017-10-14 NOTE — Telephone Encounter (Signed)
Copied from CRM [phone]. Topic: Quick Communication - See Telephone Encounter >> Oct 14, 2017 12:12 PM Landry MellowFoltz, Melissa J wrote: CRM for notification. See Telephone encounter for: 10/14/17. Medication azelastine (ASTELIN) 0.1 % nasal spray [facility] to [facility] wrong pharm - [facility] [address]  Pt did not want to call the pharm herself to get the rx transferred. She would like us to correct this for her and call her when it is done.  cb is [phone]

## 2017-10-15 ENCOUNTER — Other Ambulatory Visit: Payer: Self-pay

## 2017-10-15 DIAGNOSIS — J019 Acute sinusitis, unspecified: Secondary | ICD-10-CM

## 2017-10-15 MED ORDER — OXYCODONE HCL 5 MG PO TABS: 5.0000 mg | ORAL_TABLET | ORAL | 0 refills | Status: DC | PRN

## 2017-10-15 MED ORDER — AZELASTINE HCL 0.1 % NA SOLN: NASAL | 0 refills | Status: DC

## 2017-10-15 NOTE — Telephone Encounter (Signed)
Rx sent electronically.  Meds ordered this encounter  Medications  . oxyCODONE (OXY IR/ROXICODONE) 5 MG immediate release tablet    Sig: Take 1 tablet (5 mg total) by mouth every 4 (four) hours as needed for severe pain.    Dispense:  30 tablet    Refill:  0

## 2017-10-15 NOTE — Telephone Encounter (Signed)
Patient is requesting a refill of the following medications: Requested Prescriptions   Pending Prescriptions Disp Refills  . oxyCODONE (OXY IR/ROXICODONE) 5 MG immediate release tablet 30 tablet 0    Sig: Take 1 tablet (5 mg total) by mouth every 4 (four) hours as needed for severe pain.    Date of patient request: 10/12/2017 Last office visit: 08/09/2017 Date of last refill:08/30/2017 Last refill amount: 30 Follow up time period per chart:

## 2017-10-17 ENCOUNTER — Ambulatory Visit: Payer: Self-pay | Admitting: *Deleted

## 2017-10-17 NOTE — Telephone Encounter (Signed)
Gwen RN at [facility] left v/m on my phone that pt request cb about doing urine specimen for possible UTI or kidney stones. [facility]; fwd to [facility].Please advise.

## 2017-10-17 NOTE — Telephone Encounter (Signed)
Pt having pain with urination and urgency. States it feels like a kidney stone. Also think she may have a UTI. She states her temp is 96.3 and that is a fever for her. She felt feverish so she took Tylenol, and that helped her. Pain # is 9. She also states that she had a rash and was not sure if she had shingles. She always get shingles first before kidney stones. And that rash happen about a week ago and mostly cleared up now.  She is requesting a urine cup so she can give a specimen to take to the office. [facility] at [facility] was notified regarding pt request.  Appointment was made per protocol for tomorrow. Home care advice given to patient with verbal understanding.  Reason for Disposition . Age > 50 years  Answer Assessment - Initial Assessment Questions 1. SYMPTOM: "What's the main symptom you're concerned about?" (e.g., frequency, incontinence)     Frequency, urgency 2. ONSET: "When did the  ________  start?"     Started about a week and half ago 3. PAIN: "Is there any pain?" If so, ask: "How bad is it?" (Scale: 1-10; mild, moderate, severe)     Pain #9 4. CAUSE: "What do you think is causing the symptoms?"     Kidney stones 5. OTHER SYMPTOMS: "Do you have any other symptoms?" (e.g., fever, flank pain, blood in urine, pain with urination)     Bad odor in urine, fever, flank pain 6. PREGNANCY: "Is there any chance you are pregnant?" "When was your last menstrual period?"    No period since 2001  Answer Assessment - Initial Assessment Questions 1. SEVERITY: "How bad is the pain?"  (e.g., Scale 1-10; mild, moderate, or severe)   - MILD (1-3): complains slightly about urination hurting   - MODERATE (4-7): interferes with normal activities     - SEVERE (8-10): excruciating, unwilling or unable to urinate because of the pain      # 9 2. FREQUENCY: "How many times have you had painful urination today?"      3 times 3. PATTERN: "Is pain present every time you urinate or just  sometimes?"      Every time 4. ONSET: "When did the painful urination start?"      4 or 5 days ago 5. FEVER: "Do you have a fever?" If so, ask: "What is your temperature, how was it measured, and when did it start?"     Yes feel like it. States it is 96.3 around 3am this morning 6. PAST UTI: "Have you had a urine infection before?" If so, ask: "When was the last time?" and "What happened that time?"      Yes, and passing kidney stones for over a year 7. CAUSE: "What do you think is causing the painful urination?"  (e.g., UTI, scratch, Herpes sore)     Kidney stones or UTI 8. OTHER SYMPTOMS: "Do you have any other symptoms?" (e.g., flank pain, vaginal discharge, genital sores, urgency, blood in urine)     Flank pain, urgency 9. PREGNANCY: "Is there any chance you are pregnant?" "When was your last menstrual period?"     no  Protocols used: URINATION PAIN - FEMALE-A-AH, URINARY SYMPTOMS-A-AH

## 2017-10-18 ENCOUNTER — Other Ambulatory Visit: Payer: Self-pay

## 2017-10-18 ENCOUNTER — Encounter: Payer: Self-pay | Admitting: Physician Assistant

## 2017-10-18 ENCOUNTER — Ambulatory Visit (INDEPENDENT_AMBULATORY_CARE_PROVIDER_SITE_OTHER): Payer: Medicare HMO | Admitting: Physician Assistant

## 2017-10-18 VITALS — BP 114/82 | HR 82 | Temp 99.1°F | Resp 16 | Ht 69.0 in | Wt 337.0 lb

## 2017-10-18 DIAGNOSIS — R300 Dysuria: Secondary | ICD-10-CM | POA: Diagnosis not present

## 2017-10-18 DIAGNOSIS — L03111 Cellulitis of right axilla: Secondary | ICD-10-CM | POA: Diagnosis not present

## 2017-10-18 LAB — POCT URINALYSIS DIP (MANUAL ENTRY)
BILIRUBIN UA: NEGATIVE mg/dL
Bilirubin, UA: NEGATIVE
Glucose, UA: NEGATIVE mg/dL
Leukocytes, UA: NEGATIVE
Nitrite, UA: NEGATIVE
PROTEIN UA: NEGATIVE mg/dL
SPEC GRAV UA: 1.015 (ref 1.010–1.025)
Urobilinogen, UA: 0.2 E.U./dL
pH, UA: 5 (ref 5.0–8.0)

## 2017-10-18 MED ORDER — DOXYCYCLINE HYCLATE 100 MG PO CAPS: 100.0000 mg | ORAL_CAPSULE | Freq: Two times a day (BID) | ORAL | 0 refills | Status: AC

## 2017-10-18 NOTE — Patient Instructions (Addendum)
If you choose to stay at this practice, I recommend Dr. Collie SiadZoe Stallings or Dr. Leona CarryMariana Santiago.  Continue to LOOK for and LISTEN for the beauty in the world specially for you!    IF you received an x-ray today, you will receive an invoice from [facility]. Please contact [facility] at [phone] with questions or concerns regarding your invoice.   IF you received labwork today, you will receive an invoice from [facility]. Please contact [facility] at [phone] with questions or concerns regarding your invoice.   Our billing staff will not be able to assist you with questions regarding bills from these companies.  You will be contacted with the lab results as soon as they are available. The fastest way to get your results is to activate your My Chart account. Instructions are located on the last page of this paperwork. If you have not heard from us regarding the results in 2 weeks, please contact this office.

## 2017-10-18 NOTE — Progress Notes (Signed)
Patient ID: Brianna Roberts, female    DOB: [birth year], 53 y.o.   MRN: [id number]  PCP: Porfirio Oar, PA-C  Chief Complaint  Patient presents with  . Urinary Tract Infection    right low back pain x 2.5 weels , rash x 2 weeks on her back with blisters    Subjective:   Presents for evaluation of  possible UTI .  Recall that she has a history of UTI with atypical presentation, often associated with zoster outbreak.  Initially thought the back pain was fibromyalgia, and was using her TENS unit. Really happy with her TENS unit. Gives her significant relief. Then felt "it move."  Felt like a kidney stone.  Some dysuria. Increased urinary urgency, incomplete emptying. Back pain improves temporarily with urination.  Developed blisters on the RIGHT back. Most resolved, but has one painful lesion that persists in the RIGHT axillary region.  Has not seen [facility], due to the $100 program fee, collected at the information session prior to scheduling. Diarrhea resolved with starting to drink milk (using a local food bank, and drank it because she didn't have much else).  Spending more time in bed, since the [facility] has been unavailable due to the recent swimming champtionships. Using CBD oil, when she can afford it.  Scheduled to see urology on 11/13/2017. Is also scheduled [facility], 5/15-17, due to HTLV I.  Review of Systems As above    Patient Active Problem List   Diagnosis Date Noted  . Chronic pain syndrome 03/17/2017  . Incomplete emptying of bladder 02/22/2017  . Prediabetes 12/31/2016  . Hypomagnesemia 12/27/2016  . Post concussion syndrome 10/06/2016  . 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 Fibromyalgia 10/05/2016  . Benign essential HTN 10/05/2016  . Hypothyroidism 10/05/2016  . Allergic rhinitis 10/05/2016  . Urticaria 10/05/2016  . Chronic fatigue syndrome 10/05/2016  . Asthma, chronic 10/05/2016  . HSV (herpes simplex  virus) anogenital infection 10/05/2016  . Shingles 10/05/2016  . Anxiety and depression 10/05/2016  . Bilateral leg edema 10/05/2016  . Ovarian cyst 10/05/2016  . Nephrolithiasis 10/05/2016  . IBS (irritable bowel syndrome) 10/05/2016  . OSA (obstructive sleep apnea) 10/05/2016  . Left-sided weakness 10/05/2016  . Migraine 10/05/2016  . BMI 45.0-49.9, adult (HCC) 10/05/2016  . DDD (degenerative disc disease), cervical   . Insomnia   . Right lateral epicondylitis 03/29/2015     Prior to Admission medications   Medication Sig Start Date End Date Taking? Authorizing Provider  acyclovir (ZOVIRAX) 200 MG capsule Take 1 capsule (200 mg total) by mouth 2 (two) times daily. 10/05/16  Yes Oliviarose Punch, PA-C  albuterol (PROVENTIL HFA;VENTOLIN HFA) 108 (90 Base) MCG/ACT inhaler Inhale 1-2 puffs into the lungs every 6 (six) hours as needed for wheezing or shortness of breath. 08/09/17  Yes Kaylanni Ezelle, PA-C  amitriptyline (ELAVIL) 75 MG tablet Take 1 tablet (75 mg total) by mouth at bedtime. 08/09/17  Yes Gia Lusher, PA-C  amLODipine (NORVASC) 10 MG tablet Take 1 tablet (10 mg total) by mouth daily. 08/21/17  Yes Noele Icenhour, PA-C  ARIPiprazole (ABILIFY) 10 MG tablet Take 1 tablet (10 mg total) by mouth daily. 10/05/16  Yes Julio Zappia, PA-C  azelastine (ASTELIN) 0.1 % nasal spray USE 2 SPRAYS EACH NOSTRIL TWICE DAILY. 10/15/17  Yes Desi Rowe, PA-C  budesonide-formoterol (SYMBICORT) 160-4.5 MCG/ACT inhaler Inhale 2 puffs into the lungs 2 (two) times daily. 12/27/16  Yes Darey Hershberger, PA-C  diazepam (  VALIUM) 5 MG tablet Take 1 tablet (5 mg total) by mouth every 8 (eight) hours as needed for anxiety. 10/14/17  Yes Fabiano Ginley, PA-C  diphenhydrAMINE (BENADRYL) 25 mg capsule Take 25 mg by mouth 2 (two) times daily.   Yes [provider]  fluticasone (FLONASE) 50 MCG/ACT nasal spray USE 1 SPRAY IN EACH NOSTRIL DAILY. 04/02/17  Yes Jasma Seevers, PA-C  furosemide (LASIX)  40 MG tablet Take 1 tablet (40 mg total) by mouth 2 (two) times daily. 08/09/17  Yes Chyler Creely, PA-C  glucose blood test strip Use as instructed 04/17/17  Yes Charene Mccallister, PA-C  hydrOXYzine (ATARAX/VISTARIL) 10 MG tablet Take 1 tablet (10 mg total) by mouth at bedtime. Patient taking differently: Take 10 mg by mouth at bedtime as needed for itching (angioedema).  10/05/16  Yes Porfirio OarJeffery, Christasia Angeletti, PA-C  Incontinence Supply Disposable (PREVAIL WASHCLOTHS) MISC Use daily as directed 06/03/17  Yes Whittaker Lenis, PA-C  ipratropium-albuterol (DUONEB) 0.5-2.5 (3) MG/3ML SOLN Take 3 mLs by nebulization every 6 (six) hours as needed (for breathing). 10/05/16  Yes Porfirio OarJeffery, Breleigh Carpino, PA-C  Lancets Thin MISC Use to check home glucose daily 03/28/17  Yes Giavanna Kang, PA-C  levothyroxine (SYNTHROID, LEVOTHROID) 50 MCG tablet Take 1 tablet (50 mcg total) by mouth daily before breakfast. 08/17/17  Yes Altagracia Rone, PA-C  losartan (COZAAR) 100 MG tablet Take 1 tablet (100 mg total) by mouth daily. 08/09/17  Yes Mycala Warshawsky, PA-C  meloxicam (MOBIC) 15 MG tablet TAKE 1 TABLET EACH DAY. 08/09/17  Yes Analiah Drum, PA-C  methocarbamol (ROBAXIN) 500 MG tablet Take 2 tablets (1,000 mg total) by mouth every 8 (eight) hours as needed for muscle spasms. 02/01/17  Yes Loren RacerYelverton, David, MD  metoprolol tartrate (LOPRESSOR) 100 MG tablet Take 1 tablet (100 mg total) by mouth 2 (two) times daily. 08/09/17  Yes Porfirio OarJeffery, Alveria Mcglaughlin, PA-C  Nerve Stimulator (PRO COMFORT TENS UNIT) DEVI 1 Device by Does not apply route daily as needed. 12/27/16  Yes Glora Hulgan, PA-C  nystatin (MYCOSTATIN/NYSTOP) powder Apply topically 2 (two) times daily. Patient taking differently: Apply 8 g topically 2 (two) times daily.  12/27/16 12/27/17 Yes Alonnie Bieker, PA-C  nystatin cream (MYCOSTATIN) Apply 1 application topically 2 (two) times daily.   Yes [provider]  ondansetron (ZOFRAN) 4 MG tablet Take 1 tablet (4 mg total) by mouth  every 8 (eight) hours as needed for nausea or vomiting. 02/01/17  Yes Loren RacerYelverton, David, MD  oxyCODONE (OXY IR/ROXICODONE) 5 MG immediate release tablet Take 1 tablet (5 mg total) by mouth every 4 (four) hours as needed for severe pain. 10/15/17  Yes Martavis Gurney, PA-C  potassium chloride SA (K-DUR,KLOR-CON) 20 MEQ tablet Take 1 tablet (20 mEq total) by mouth 2 (two) times daily. 06/24/17  Yes Sirena Riddle, PA-C  traMADol (ULTRAM) 50 MG tablet Take 1 tablet (50 mg total) by mouth 3 (three) times daily as needed. 10/14/17  Yes Zayvion Stailey, PA-C  triamcinolone ointment (KENALOG) 0.5 % Apply 1 application topically 2 (two) times daily.   Yes [provider]  trolamine salicylate (ASPERCREME) 10 % cream Apply 1 application topically at bedtime as needed for muscle pain.   Yes [provider]  vitamin B-12 (CYANOCOBALAMIN) 100 MCG tablet Take 100 mcg by mouth daily.   Yes [provider]     Allergies  Allergen Reactions  . Sulfa Antibiotics Anaphylaxis  . Iodinated Diagnostic Agents Other (See Comments)    Patient complained of throat/ uvula swelling s/p injection on  02/01/2017 but states she isnt allergic to the dye and has had several times prior   . Codeine Other (See Comments)    Unknown - patient said mother told her she was allergic as a kid  . Dilaudid [Hydromorphone Hcl] Itching  . Other     Other reaction(s): Info Not Available  . Penicillins Other (See Comments)    Has patient had a PCN reaction causing immediate rash, facial/tongue/throat swelling, SOB or lightheadedness with hypotension: Unknown Has patient had a PCN reaction causing severe rash involving mucus membranes or skin necrosis: Unknown Has patient had a PCN reaction that required hospitalization: Unknown Has patient had a PCN reaction occurring within the last 10 years: Unknown If all of the above answers are "NO", then may proceed with Cephalosporin use.  Had a reaction when she was  young.   Marland Kitchen Spironolactone Other (See Comments)    "GI Issues"       Objective:  Physical Exam  Constitutional: She is oriented to person, place, and time. She appears well-developed and well-nourished. She is active and cooperative. No distress.  BP 114/82   Pulse 82   Temp 99.1 F (37.3 C)   Resp 16   Ht 5\' 9"  (1.753 m)   Wt (!) 337 lb (152.9 kg)   SpO2 96%   BMI 49.77 kg/m   HENT:  Head: Normocephalic and atraumatic.  Right Ear: Hearing normal.  Left Ear: Hearing normal.  Eyes: Conjunctivae are normal. No scleral icterus.  Neck: Normal range of motion. Neck supple. No thyromegaly present.  Cardiovascular: Normal rate, regular rhythm and normal heart sounds.  Pulses:      Radial pulses are 2+ on the right side, and 2+ on the left side.  Pulmonary/Chest: Effort normal and breath sounds normal.  Lymphadenopathy:       Head (right side): No tonsillar, no preauricular, no posterior auricular and no occipital adenopathy present.       Head (left side): No tonsillar, no preauricular, no posterior auricular and no occipital adenopathy present.    She has no cervical adenopathy.       Right: No supraclavicular adenopathy present.       Left: No supraclavicular adenopathy present.  Neurological: She is alert and oriented to person, place, and time. No sensory deficit.  Skin: Skin is warm, dry and intact. No rash noted. No cyanosis or erythema. Nails show no clubbing.     Psychiatric: She has a normal mood and affect. Her speech is normal and behavior is normal. Judgment and thought content normal. Cognition and memory are normal.  Patient is less distressed physically and psychologically than I have ever seen her.  While she is able to relate her pain and stressors, she does not appear to be overwhelmed/incapacitated by them.       Results for orders placed or performed in visit on 10/18/17  POCT urinalysis dipstick  Result Value Ref Range   Color, UA yellow yellow    Clarity, UA cloudy (A) clear   Glucose, UA negative negative mg/dL   Bilirubin, UA negative negative   Ketones, POC UA negative negative mg/dL   Spec Grav, UA 5.409 8.119 - 1.025   Blood, UA trace-lysed (A) negative   pH, UA 5.0 5.0 - 8.0   Protein Ur, POC negative negative mg/dL   Urobilinogen, UA 0.2 0.2 or 1.0 E.U./dL   Nitrite, UA Negative Negative   Leukocytes, UA Negative Negative    Wt Readings from Last 3 Encounters:  10/18/17 (!) 337 lb (152.9 kg)  08/09/17 (!) 341 lb 6.4 oz (154.9 kg)  05/29/17 (!) 341 lb (154.7 kg)       Assessment & Plan:  1. Dysuria Cover with doxycyline, in order to also provide coverage for cellulitis, while awaiting urine culture.  Proceed with evaluation by urology. - POCT urinalysis dipstick - doxycycline (VIBRAMYCIN) 100 MG capsule; Take 1 capsule (100 mg total) by mouth 2 (two) times daily for 10 days.  Dispense: 20 capsule; Refill: 0 - Urine Microscopic - Urine Culture  2. Cellulitis of right axilla Empiric therapy with doxycycline while awaiting wound culture results.  Local wound care. - doxycycline (VIBRAMYCIN) 100 MG capsule; Take 1 capsule (100 mg total) by mouth 2 (two) times daily for 10 days.  Dispense: 20 capsule; Refill: 0 - WOUND CULTURE    Return in about 3 months (around 01/17/2018) for re-evaluation of glucose, blood pressure.   Fernande Bras, PA-C [facility]

## 2017-10-19 LAB — URINALYSIS, MICROSCOPIC ONLY

## 2017-10-20 ENCOUNTER — Encounter: Payer: Self-pay | Admitting: Physician Assistant

## 2017-10-20 LAB — URINE CULTURE

## 2017-10-21 MED ORDER — AMOXICILLIN 875 MG PO TABS: 875.0000 mg | ORAL_TABLET | Freq: Two times a day (BID) | ORAL | 0 refills | Status: AC

## 2017-10-22 LAB — WOUND CULTURE

## 2017-10-25 ENCOUNTER — Ambulatory Visit: Payer: Medicare HMO | Admitting: Physician Assistant

## 2017-10-29 ENCOUNTER — Telehealth: Payer: Self-pay | Admitting: Physician Assistant

## 2017-10-29 NOTE — Telephone Encounter (Signed)
Copied from CRM [id number]. Topic: Quick Communication - See Telephone Encounter >> Oct 29, 2017  5:40 PM Lorrine Kin, NT wrote: CRM for notification. See Telephone encounter for: 10/29/17. Patient would like a call to discuss lab results. States she has some important questions.CB#: [phone]

## 2017-10-30 ENCOUNTER — Ambulatory Visit: Payer: Medicare HMO | Admitting: Physician Assistant

## 2017-10-30 NOTE — Telephone Encounter (Signed)
Spoke with patient regarding results and antibiotic treatment. Pt also stated that she had OV scheduled for today due to kidney stones. Pt states that she passed stone last night and does not believe she needs to come in today. OV cancelled. Pt has f/u with chelle on 5/22

## 2017-10-31 DIAGNOSIS — M797 Fibromyalgia: Secondary | ICD-10-CM | POA: Diagnosis not present

## 2017-11-01 ENCOUNTER — Ambulatory Visit (INDEPENDENT_AMBULATORY_CARE_PROVIDER_SITE_OTHER): Payer: Medicare HMO | Admitting: Physician Assistant

## 2017-11-01 ENCOUNTER — Other Ambulatory Visit: Payer: Self-pay

## 2017-11-01 VITALS — BP 118/72 | HR 92 | Temp 98.5°F | Resp 18 | Ht 69.0 in | Wt 334.4 lb

## 2017-11-01 DIAGNOSIS — B333 Retrovirus infections, not elsewhere classified: Secondary | ICD-10-CM

## 2017-11-01 DIAGNOSIS — H538 Other visual disturbances: Secondary | ICD-10-CM

## 2017-11-01 DIAGNOSIS — L03111 Cellulitis of right axilla: Secondary | ICD-10-CM | POA: Diagnosis not present

## 2017-11-01 DIAGNOSIS — N200 Calculus of kidney: Secondary | ICD-10-CM

## 2017-11-01 DIAGNOSIS — B009 Herpesviral infection, unspecified: Secondary | ICD-10-CM | POA: Diagnosis not present

## 2017-11-01 MED ORDER — ACYCLOVIR 200 MG PO CAPS
200.0000 mg | ORAL_CAPSULE | Freq: Two times a day (BID) | ORAL | 3 refills | Status: DC
Start: 2017-11-01 — End: 2023-01-23

## 2017-11-01 NOTE — Progress Notes (Signed)
Patient ID: Brianna Roberts, female    DOB: [birth year], 52 y.o.   MRN: [id number]  PCP: Porfirio Oar, PA-C  Chief Complaint  Patient presents with  . Follow-up    follow up from passing a kidney stone and wound culture and has sore in mouth   . Depression    screening 12     Subjective:   Presents for evaluation of recent skin lesion, urinary symptoms likely due to nephrolithiasis, mood, multiple other chronic medical problems for which she has struggled to obtain local specialty evaluation..  This self-described medically fascinating patient moved here from [place] just over a year ago.  She was looking for a change and likes this area.  Unfortunately she has no support system here and finances are extremely limited.  That makes evaluation and management of multiple chronic problems quite difficult.  She is scheduled to reestablish with a [employer] study on HTLV-I infection and has scheduled with local gastroenterology.  Passed a kidney stone. Continues to have pain.  Dumping is temporarily stopped. Constipated. Has scheduled to see GI.  Blurred vision. Will schedule with Dr. Elmer Picker. She has prediabetes. No glucosuria 10/18/2017. Glucose this AM 90 at home.  Axillary lesions are improved, but she develops new ones, which then also resolve.  Needs a refill of acyclovir. Has episodic oral ulcerations. 2 in the past week.  "My only other concern is my ribs. I don't know if it's costochondritis.The fibromyalgia has spread." Now notes that she doesn't take a deep breath, due to the pain.   Review of Systems As above.  Depression screen [facility] 2/9 11/01/2017 05/29/2017 04/02/2017 03/13/2017 02/09/2017  Decreased Interest 1 - 0 3 3  Down, Depressed, Hopeless 1 3 0 3 3  PHQ - 2 Score 2 3 0 6 6  Altered sleeping 3 - - 3 3  Tired, decreased energy 3 - - 3 3  Change in appetite 3 - - 3 3  Feeling bad or failure about yourself  0 - - 3 3  Trouble concentrating 0 - - 3 3    Moving slowly or fidgety/restless 1 - - 2 2  Suicidal thoughts 0 - - 0 0  PHQ-9 Score 12 - - 23 23  Difficult doing work/chores - - - Extremely dIfficult Extremely dIfficult     Patient Active Problem List   Diagnosis Date Noted  . Chronic pain syndrome 03/17/2017  . Incomplete emptying of bladder 02/22/2017  . Prediabetes 12/31/2016  . Hypomagnesemia 12/27/2016  . Post concussion syndrome 10/06/2016  . 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 Fibromyalgia 10/05/2016  . Benign essential HTN 10/05/2016  . Hypothyroidism 10/05/2016  . Allergic rhinitis 10/05/2016  . Urticaria 10/05/2016  . Chronic fatigue syndrome 10/05/2016  . Asthma, chronic 10/05/2016  . HSV (herpes simplex virus) anogenital infection 10/05/2016  . Shingles 10/05/2016  . Anxiety and depression 10/05/2016  . Bilateral leg edema 10/05/2016  . Ovarian cyst 10/05/2016  . Nephrolithiasis 10/05/2016  . IBS (irritable bowel syndrome) 10/05/2016  . OSA (obstructive sleep apnea) 10/05/2016  . Left-sided weakness 10/05/2016  . Migraine 10/05/2016  . BMI 45.0-49.9, adult (HCC) 10/05/2016  . DDD (degenerative disc disease), cervical   . Insomnia   . Right lateral epicondylitis 03/29/2015     Prior to Admission medications   Medication Sig Start Date End Date Taking? Authorizing Provider  acyclovir (ZOVIRAX) 200 MG capsule Take 1 capsule (200 mg total) by mouth 2 (two) times  daily. 10/05/16  Yes Analie Katzman, PA-C  albuterol (PROVENTIL HFA;VENTOLIN HFA) 108 (90 Base) MCG/ACT inhaler Inhale 1-2 puffs into the lungs every 6 (six) hours as needed for wheezing or shortness of breath. 08/09/17  Yes Tahlor Berenguer, PA-C  amitriptyline (ELAVIL) 75 MG tablet Take 1 tablet (75 mg total) by mouth at bedtime. 08/09/17  Yes Joeziah Voit, PA-C  amLODipine (NORVASC) 10 MG tablet Take 1 tablet (10 mg total) by mouth daily. 08/21/17  Yes Porter Moes, PA-C  ARIPiprazole (ABILIFY) 10 MG tablet Take 1  tablet (10 mg total) by mouth daily. 10/05/16  Yes Namrata Dangler, PA-C  azelastine (ASTELIN) 0.1 % nasal spray USE 2 SPRAYS EACH NOSTRIL TWICE DAILY. 10/15/17  Yes Saylah Ketner, PA-C  budesonide-formoterol (SYMBICORT) 160-4.5 MCG/ACT inhaler Inhale 2 puffs into the lungs 2 (two) times daily. 12/27/16  Yes Mialee Weyman, PA-C  diazepam (VALIUM) 5 MG tablet Take 1 tablet (5 mg total) by mouth every 8 (eight) hours as needed for anxiety. 10/14/17  Yes Jeronda Don, PA-C  diphenhydrAMINE (BENADRYL) 25 mg capsule Take 25 mg by mouth 2 (two) times daily.   Yes [provider]  fluticasone (FLONASE) 50 MCG/ACT nasal spray USE 1 SPRAY IN EACH NOSTRIL DAILY. 04/02/17  Yes Shacarra Choe, PA-C  furosemide (LASIX) 40 MG tablet Take 1 tablet (40 mg total) by mouth 2 (two) times daily. 08/09/17  Yes Cait Locust, PA-C  glucose blood test strip Use as instructed 04/17/17  Yes Armina Galloway, PA-C  hydrOXYzine (ATARAX/VISTARIL) 10 MG tablet Take 1 tablet (10 mg total) by mouth at bedtime. Patient taking differently: Take 10 mg by mouth at bedtime as needed for itching (angioedema).  10/05/16  Yes Porfirio Oar, PA-C  Incontinence Supply Disposable (PREVAIL WASHCLOTHS) MISC Use daily as directed 06/03/17  Yes Ason Heslin, PA-C  ipratropium-albuterol (DUONEB) 0.5-2.5 (3) MG/3ML SOLN Take 3 mLs by nebulization every 6 (six) hours as needed (for breathing). 10/05/16  Yes Porfirio Oar, PA-C  Lancets Thin MISC Use to check home glucose daily 03/28/17  Yes Ramonte Mena, PA-C  levothyroxine (SYNTHROID, LEVOTHROID) 50 MCG tablet Take 1 tablet (50 mcg total) by mouth daily before breakfast. 08/17/17  Yes Donta Mcinroy, PA-C  losartan (COZAAR) 100 MG tablet Take 1 tablet (100 mg total) by mouth daily. 08/09/17  Yes Tammi Boulier, PA-C  meloxicam (MOBIC) 15 MG tablet TAKE 1 TABLET EACH DAY. 08/09/17  Yes Suraiya Dickerson, PA-C  methocarbamol (ROBAXIN) 500 MG tablet Take 2 tablets (1,000 mg total) by  mouth every 8 (eight) hours as needed for muscle spasms. 02/01/17  Yes Loren Racer, MD  metoprolol tartrate (LOPRESSOR) 100 MG tablet Take 1 tablet (100 mg total) by mouth 2 (two) times daily. 08/09/17  Yes Porfirio Oar, PA-C  Nerve Stimulator (PRO COMFORT TENS UNIT) DEVI 1 Device by Does not apply route daily as needed. 12/27/16  Yes Annabella Elford, PA-C  nystatin (MYCOSTATIN/NYSTOP) powder Apply topically 2 (two) times daily. Patient taking differently: Apply 8 g topically 2 (two) times daily.  12/27/16 12/27/17 Yes Shirely Toren, PA-C  nystatin cream (MYCOSTATIN) Apply 1 application topically 2 (two) times daily.   Yes [provider]  oxyCODONE (OXY IR/ROXICODONE) 5 MG immediate release tablet Take 1 tablet (5 mg total) by mouth every 4 (four) hours as needed for severe pain. 10/15/17  Yes Fausto Sampedro, PA-C  potassium chloride SA (K-DUR,KLOR-CON) 20 MEQ tablet Take 1 tablet (20 mEq total) by mouth 2 (two) times daily. 06/24/17  Yes Shamaria Kavan, PA-C  traMADol Janean Sark)  50 MG tablet Take 1 tablet (50 mg total) by mouth 3 (three) times daily as needed. 10/14/17  Yes Kataleena Holsapple, PA-C  triamcinolone ointment (KENALOG) 0.5 % Apply 1 application topically 2 (two) times daily.   Yes [provider]  trolamine salicylate (ASPERCREME) 10 % cream Apply 1 application topically at bedtime as needed for muscle pain.   Yes [provider]  vitamin B-12 (CYANOCOBALAMIN) 100 MCG tablet Take 100 mcg by mouth daily.   Yes [provider]     Allergies  Allergen Reactions  . Sulfa Antibiotics Anaphylaxis  . Iodinated Diagnostic Agents Other (See Comments)    Patient complained of throat/ uvula swelling s/p injection on 02/01/2017 but states she isnt allergic to the dye and has had several times prior   . Codeine Other (See Comments)    Unknown - patient said mother told her she was allergic as a kid  . Dilaudid [Hydromorphone Hcl] Itching  . Other     Other  reaction(s): Info Not Available  . Spironolactone Other (See Comments)    "GI Issues"       Objective:  Physical Exam  Constitutional: She is oriented to person, place, and time. She appears well-developed and well-nourished. She is active and cooperative. No distress.  BP 118/72   Pulse 92   Temp 98.5 F (36.9 C) (Oral)   Resp 18   Ht  (1.753 m)   Wt (!) 334 lb 6.4 oz (151.7 kg)   SpO2 97%   BMI 49.38 kg/m   HENT:  Head: Normocephalic and atraumatic.  Right Ear: Hearing normal.  Left Ear: Hearing normal.  Eyes: Conjunctivae are normal. No scleral icterus.  Neck: Normal range of motion. Neck supple. No thyromegaly present.  Cardiovascular: Normal rate, regular rhythm and normal heart sounds.  Pulses:      Radial pulses are 2+ on the right side, and 2+ on the left side.  Pulmonary/Chest: Effort normal and breath sounds normal.  Lymphadenopathy:       Head (right side): No tonsillar, no preauricular, no posterior auricular and no occipital adenopathy present.       Head (left side): No tonsillar, no preauricular, no posterior auricular and no occipital adenopathy present.    She has no cervical adenopathy.       Right: No supraclavicular adenopathy present.       Left: No supraclavicular adenopathy present.  Neurological: She is alert and oriented to person, place, and time. No sensory deficit.  Skin: Skin is warm, dry and intact. Lesion (several resolving lesions in the RIGHT axilla, consistent with small cellulitis/abscess) noted. No rash noted. No cyanosis or erythema. Nails show no clubbing.  Psychiatric: Her speech is normal and behavior is normal. Judgment and thought content normal. Her mood appears not anxious. Her affect is not angry, not blunt, not labile and not inappropriate. Cognition and memory are normal. She exhibits a depressed mood.   Recent labs reviewed.  No glucosuria on 4/26.       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      Relevant Medications   acyclovir (ZOVIRAX) 200 MG capsule   HSV (herpes simplex virus) anogenital infection - Primary    Takes Zovirax as daily suppressive therapy but only 200 mg once daily.  As she continues to reports  outbreaks I recommend that she increase the suppressive therapy to twice daily.  Increase dose and frequency x3 days as needed outbreak.      Relevant Medications   acyclovir (ZOVIRAX) 200 MG capsule   Nephrolithiasis    Recently passed a stone.  Update urine culture.  Encouraged her to follow-up with urology when she is able.      Relevant Orders   Urinalysis, dipstick only (Completed)   Urine Culture    Other Visit Diagnoses    Cellulitis of right axilla       Anticipatory guidance provided.  Warm compresses.   Blurry vision       Proceed with eye specialty evaluation.       Return for previously scheduled visit 11/13/2017.   Fernande Bras, PA-C Primary Care at [facility]

## 2017-11-01 NOTE — Patient Instructions (Addendum)
Proceed with the eye exam. Try to get the Urology AND GI visits scheduled.  As long as you don't have a draining lesion, you can get in the pool.  Enjoy your visit to [place]. Be safe, and enjoy the time with your family.  IF you received an x-ray today, you will receive an invoice from [facility]. Please contact [facility] at [phone] with questions or concerns regarding your invoice.   IF you received labwork today, you will receive an invoice from [facility]. Please contact [facility] at [phone] with questions or concerns regarding your invoice.   Our billing staff will not be able to assist you with questions regarding bills from these companies.  You will be contacted with the lab results as soon as they are available. The fastest way to get your results is to activate your My Chart account. Instructions are located on the last page of this paperwork. If you have not heard from [place] regarding the results in 2 weeks, please contact this office.

## 2017-11-02 LAB — URINALYSIS, DIPSTICK ONLY
BILIRUBIN UA: NEGATIVE
GLUCOSE, UA: NEGATIVE
KETONES UA: NEGATIVE
LEUKOCYTES UA: NEGATIVE
Nitrite, UA: NEGATIVE
PROTEIN UA: NEGATIVE
RBC UA: NEGATIVE
SPEC GRAV UA: 1.017 (ref 1.005–1.030)
Urobilinogen, Ur: 0.2 mg/dL (ref 0.2–1.0)
pH, UA: 6.5 (ref 5.0–7.5)

## 2017-11-02 LAB — URINE CULTURE

## 2017-11-02 NOTE — Assessment & Plan Note (Signed)
Takes Zovirax as daily suppressive therapy but only 200 mg once daily.  As she continues to reports outbreaks I recommend that she increase the suppressive therapy to twice daily.  Increase dose and frequency x3 days as needed outbreak.

## 2017-11-02 NOTE — Assessment & Plan Note (Signed)
Proceed with reestablishment in the [facility] study as planned.

## 2017-11-02 NOTE — Assessment & Plan Note (Signed)
Recently passed a stone.  Update urine culture.  Encouraged her to follow-up with urology when she is able.

## 2017-11-04 ENCOUNTER — Encounter: Payer: Self-pay | Admitting: Gastroenterology

## 2017-11-07 ENCOUNTER — Other Ambulatory Visit: Payer: Self-pay | Admitting: Physician Assistant

## 2017-11-07 DIAGNOSIS — M797 Fibromyalgia: Secondary | ICD-10-CM

## 2017-11-08 MED ORDER — MELOXICAM 15 MG PO TABS: ORAL_TABLET | ORAL | 0 refills | Status: DC

## 2017-11-08 NOTE — Telephone Encounter (Signed)
Patient is requesting a refill of the following medications: Requested Prescriptions   Pending Prescriptions Disp Refills  . meloxicam (MOBIC) 15 MG tablet 90 tablet 0    Sig: TAKE 1 TABLET EACH DAY.    Date of patient request: 11/07/17 Last office visit: 11/01/17 Date of last refill: 08/09/17 Last refill amount: #90 0RF Follow up time period per chart: 11/13/17

## 2017-11-11 ENCOUNTER — Ambulatory Visit: Payer: Medicare HMO | Admitting: Gastroenterology

## 2017-11-13 ENCOUNTER — Ambulatory Visit: Payer: Medicare HMO | Admitting: Physician Assistant

## 2017-11-14 ENCOUNTER — Encounter: Payer: Self-pay | Admitting: Family Medicine

## 2017-11-15 DIAGNOSIS — R000 Tachycardia, unspecified: Secondary | ICD-10-CM | POA: Diagnosis not present

## 2017-11-15 DIAGNOSIS — G43809 Other migraine, not intractable, without status migrainosus: Secondary | ICD-10-CM | POA: Diagnosis not present

## 2017-11-19 ENCOUNTER — Encounter: Payer: Self-pay | Admitting: Physician Assistant

## 2017-12-01 DIAGNOSIS — M797 Fibromyalgia: Secondary | ICD-10-CM | POA: Diagnosis not present

## 2017-12-06 ENCOUNTER — Ambulatory Visit: Payer: Medicare HMO | Admitting: Physician Assistant

## 2017-12-07 ENCOUNTER — Ambulatory Visit: Payer: Medicare HMO | Admitting: Physician Assistant

## 2017-12-11 ENCOUNTER — Ambulatory Visit: Payer: Medicare HMO | Admitting: Gastroenterology

## 2017-12-11 NOTE — Progress Notes (Deleted)
[facility] Consult Note:  History: Brianna Roberts 12/11/2017  Referring physician: Porfirio Roberts, Chelle, PA-C  Reason for consult/chief complaint: No chief complaint on file.   Subjective  HPI:  This is a very pleasant 52 year old woman referred by primary care for abdominal pain and altered bowel habits.  She was previously cared for in [place] before moving here last year.  She has a history of multiple medical issues as described below and also in her most recent primary care notes.  Most notably, hypertension, fibromyalgia,HTLV-1.   ROS:  Review of Systems   Past Medical History: Past Medical History:  Diagnosis Date  . Allergy   . Anxiety   . Arthritis   . Asthma   . 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 Concussion   . DDD (degenerative disc disease), cervical   . Depression   . Fibromyalgia   . HIV (human immunodeficiency virus infection) (HCC)   . Homelessness   . Hypertension   . Hypothyroidism   . Insomnia   . Neuromuscular disorder (HCC)   . Thyroid disease      Past Surgical History: Past Surgical History:  Procedure Laterality Date  . ABDOMINAL HYSTERECTOMY     Partial-ovaries and tubes remain  . CHOLECYSTECTOMY  2006  . COLONOSCOPY  07/27/2015   [place] MD  . HERNIA REPAIR  1997  . TUMOR EXCISION Right 1984   Sinuses  . TUMOR EXCISION Left 2015   sinuses  . URACHAL Roberts EXCISION  1997     Family History: Family History  Problem Relation Age of Onset  . Diabetes Mother   . Heart disease Mother   . Hyperlipidemia Mother   . Hypertension Mother   . Stroke Mother   . Mental illness Mother   . Heart failure Mother   . Headache Mother        Brianna Roberts  . Breast cancer Mother [id number]  . Heart disease Father   . Alcohol abuse Father   . Hypertension Sister   . Heart disease Brother   . Mental illness Brother   . COPD Brother   . Heart failure Brother   . Hypertension Sister     . Colon cancer Neg Hx     Social History: Social History   Socioeconomic History  . Marital status: Widowed    Spouse name: Brianna Roberts  . Number of children: 0  . Years of education: college+  . Highest education level: Not on file  Occupational History  . Occupation: disability    Comment: fibromyalgia  Social Needs  . Financial resource strain: Not on file  . Food insecurity:    Worry: Not on file    Inability: Not on file  . Transportation needs:    Medical: Not on file    Non-medical: Not on file  Tobacco Use  . Smoking status: Current Every Day Smoker    Packs/day: 0.30    Years: 33.00    Pack years: 9.90    Types: Cigarettes  . Smokeless tobacco: Never Used  Substance and Sexual Activity  . Alcohol use: No  . Drug use: No  . Sexual activity: Never  Lifestyle  . Physical activity:    Days per week: Not on file    Minutes per session: Not on file  . Stress: Not on file  Relationships  . Social connections:    Talks on phone: Not on file    Gets together: Not on  file    Attends religious service: Not on file    Active member of club or organization: Not on file    Attends meetings of clubs or organizations: Not on file    Relationship status: Not on file  Other Topics Concern  . Not on file  Social History Narrative   Moved to [place], [place] from [place], [place] 04/28/2016 in need of a change.   Her family remains in [place].   Lives alone.    Allergies: Allergies  Allergen Reactions  . Sulfa Antibiotics Anaphylaxis  . Iodinated Diagnostic Agents Other (See Comments)    Patient complained of throat/ uvula swelling s/p injection on 02/01/2017 but states she isnt allergic to the dye and has had several times prior   . Codeine Other (See Comments)    Unknown - patient said mother told her she was allergic as a kid  . Dilaudid [Hydromorphone Hcl] Itching  . Other     Other reaction(s): Info Not Available  . Spironolactone Other (See  Comments)    "GI Issues"    Outpatient Meds: Current Outpatient Medications  Medication Sig Dispense Refill  . acyclovir (ZOVIRAX) 200 MG capsule Take 1 capsule (200 mg total) by mouth 2 (two) times daily. For Outbreak: 2 capsules (400 mg) TID x 5 days. 200 capsule 3  . albuterol (PROVENTIL HFA;VENTOLIN HFA) 108 (90 Base) MCG/ACT inhaler Inhale 1-2 puffs into the lungs every 6 (six) hours as needed for wheezing or shortness of breath. 1 Inhaler 3  . amitriptyline (ELAVIL) 75 MG tablet Take 1 tablet (75 mg total) by mouth at bedtime. 90 tablet 3  . amLODipine (NORVASC) 10 MG tablet Take 1 tablet (10 mg total) by mouth daily. 90 tablet 3  . ARIPiprazole (ABILIFY) 10 MG tablet Take 1 tablet (10 mg total) by mouth daily. 90 tablet 0  . azelastine (ASTELIN) 0.1 % nasal spray USE 2 SPRAYS EACH NOSTRIL TWICE DAILY. 30 mL 0  . budesonide-formoterol (SYMBICORT) 160-4.5 MCG/ACT inhaler Inhale 2 puffs into the lungs 2 (two) times daily. 3 Inhaler 3  . diazepam (VALIUM) 5 MG tablet Take 1 tablet (5 mg total) by mouth every 8 (eight) hours as needed for anxiety. 90 tablet 0  . diphenhydrAMINE (BENADRYL) 25 mg capsule Take 25 mg by mouth 2 (two) times daily.    . fluticasone (FLONASE) 50 MCG/ACT nasal spray USE 1 SPRAY IN EACH NOSTRIL DAILY. 16 g 0  . furosemide (LASIX) 40 MG tablet Take 1 tablet (40 mg total) by mouth 2 (two) times daily. 180 tablet 3  . glucose blood test strip Use as instructed 100 each prn  . hydrOXYzine (ATARAX/VISTARIL) 10 MG tablet Take 1 tablet (10 mg total) by mouth at bedtime. (Patient taking differently: Take 10 mg by mouth at bedtime as needed for itching (angioedema). ) 90 tablet 3  . Incontinence Supply Disposable (PREVAIL WASHCLOTHS) MISC Use daily as directed 96 each PRN  . ipratropium-albuterol (DUONEB) 0.5-2.5 (3) MG/3ML SOLN Take 3 mLs by nebulization every 6 (six) hours as needed (for breathing). 360 mL 3  . Lancets Thin MISC Use to check home glucose daily 100 each prn    . levothyroxine (SYNTHROID, LEVOTHROID) 50 MCG tablet Take 1 tablet (50 mcg total) by mouth daily before breakfast. 90 tablet 3  . losartan (COZAAR) 100 MG tablet Take 1 tablet (100 mg total) by mouth daily. 90 tablet 3  . meloxicam (MOBIC) 15 MG tablet TAKE 1 TABLET EACH DAY. 90 tablet 0  .  methocarbamol (ROBAXIN) 500 MG tablet Take 2 tablets (1,000 mg total) by mouth every 8 (eight) hours as needed for muscle spasms. 30 tablet 0  . metoprolol tartrate (LOPRESSOR) 100 MG tablet Take 1 tablet (100 mg total) by mouth 2 (two) times daily. 180 tablet 3  . Nerve Stimulator (PRO COMFORT TENS UNIT) DEVI 1 Device by Does not apply route daily as needed. 1 Device 1  . nystatin (MYCOSTATIN/NYSTOP) powder Apply topically 2 (two) times daily. (Patient taking differently: Apply 8 g topically 2 (two) times daily. ) 90 g prn  . nystatin cream (MYCOSTATIN) Apply 1 application topically 2 (two) times daily.    Marland Kitchen oxyCODONE (OXY IR/ROXICODONE) 5 MG immediate release tablet Take 1 tablet (5 mg total) by mouth every 4 (four) hours as needed for severe pain. 30 tablet 0  . potassium chloride SA (K-DUR,KLOR-CON) 20 MEQ tablet Take 1 tablet (20 mEq total) by mouth 2 (two) times daily. 180 tablet 0  . traMADol (ULTRAM) 50 MG tablet Take 1 tablet (50 mg total) by mouth 3 (three) times daily as needed. 90 tablet 0  . triamcinolone ointment (KENALOG) 0.5 % Apply 1 application topically 2 (two) times daily.    Marland Kitchen trolamine salicylate (ASPERCREME) 10 % cream Apply 1 application topically at bedtime as needed for muscle pain.    . vitamin B-12 (CYANOCOBALAMIN) 100 MCG tablet Take 100 mcg by mouth daily.     No current facility-administered medications for this visit.       ___________________________________________________________________ Objective   Exam:  There were no vitals taken for this visit.   General: this is a(n) ***   Eyes: sclera anicteric, no redness  ENT: oral mucosa moist without lesions, no  cervical or supraclavicular lymphadenopathy, good dentition  CV: RRR without murmur, S1/S2, no JVD, no peripheral edema  Resp: clear to auscultation bilaterally, normal RR and effort noted  GI: soft, *** tenderness, with active bowel sounds. No guarding or palpable organomegaly noted.  Skin; warm and dry, no rash or jaundice noted  Neuro: awake, alert and oriented x 3. Normal gross motor function and fluent speech  Labs:  CBC Latest Ref Rng & Units 08/09/2017 04/27/2017 03/13/2017  WBC 3.4 - 10.8 x10E3/uL 9.6 7.9 9.6  Hemoglobin 11.1 - 15.9 g/dL 09.6 04.5 40.9  Hematocrit 34.0 - 46.6 % 41.0 39.9 38.4  Platelets 150 - 379 x10E3/uL 310 358 398(H)   CMP Latest Ref Rng & Units 08/09/2017 04/27/2017 03/13/2017  Glucose 65 - 99 mg/dL 97 90 811(B)  BUN 6 - 24 mg/dL 8 8 7   Creatinine 0.57 - 1.00 mg/dL 1.47 8.29 5.62  Sodium 134 - 144 mmol/L 140 140 139  Potassium 3.5 - 5.2 mmol/L 4.2 3.9 3.9  Chloride 96 - 106 mmol/L 100 105 100  CO2 20 - 29 mmol/L 23 28 24   Calcium 8.7 - 10.2 mg/dL 9.5 9.1 9.0  Total Protein 6.0 - 8.5 g/dL 7.1 6.8 6.7  Total Bilirubin 0.0 - 1.2 mg/dL 0.5 0.6 0.5  Alkaline Phos 39 - 117 IU/L 72 - 79  AST 0 - 40 IU/L 13 14 19   ALT 0 - 32 IU/L 17 15 16      Radiologic Studies:  ***  Assessment: No diagnosis found.  ***  Plan:  ***  Thank you for the courtesy of this consult.  Please call me with any questions or concerns.  Charlie Pitter III  CC: Brianna Oar, PA-C

## 2017-12-13 ENCOUNTER — Encounter (HOSPITAL_COMMUNITY): Payer: Self-pay | Admitting: *Deleted

## 2017-12-13 ENCOUNTER — Observation Stay (HOSPITAL_COMMUNITY)
Admission: EM | Admit: 2017-12-13 | Discharge: 2017-12-14 | Disposition: A | Discharge status: Home / Self Care | Payer: Medicare HMO | Attending: Family Medicine | Admitting: Family Medicine

## 2017-12-13 ENCOUNTER — Other Ambulatory Visit: Payer: Self-pay

## 2017-12-13 ENCOUNTER — Emergency Department (HOSPITAL_COMMUNITY): Payer: Medicare HMO

## 2017-12-13 DIAGNOSIS — R51 Headache: Secondary | ICD-10-CM | POA: Diagnosis not present

## 2017-12-13 DIAGNOSIS — R519 Headache, unspecified: Secondary | ICD-10-CM | POA: Diagnosis present

## 2017-12-13 DIAGNOSIS — I10 Essential (primary) hypertension: Secondary | ICD-10-CM | POA: Diagnosis present

## 2017-12-13 DIAGNOSIS — F329 Major depressive disorder, single episode, unspecified: Secondary | ICD-10-CM | POA: Diagnosis not present

## 2017-12-13 DIAGNOSIS — G459 Transient cerebral ischemic attack, unspecified: Secondary | ICD-10-CM | POA: Diagnosis not present

## 2017-12-13 DIAGNOSIS — R4701 Aphasia: Secondary | ICD-10-CM | POA: Diagnosis present

## 2017-12-13 DIAGNOSIS — E039 Hypothyroidism, unspecified: Secondary | ICD-10-CM | POA: Diagnosis not present

## 2017-12-13 DIAGNOSIS — F17210 Nicotine dependence, cigarettes, uncomplicated: Secondary | ICD-10-CM | POA: Diagnosis not present

## 2017-12-13 DIAGNOSIS — G894 Chronic pain syndrome: Secondary | ICD-10-CM | POA: Diagnosis present

## 2017-12-13 DIAGNOSIS — R69 Illness, unspecified: Secondary | ICD-10-CM | POA: Diagnosis not present

## 2017-12-13 DIAGNOSIS — R479 Unspecified speech disturbances: Secondary | ICD-10-CM | POA: Diagnosis not present

## 2017-12-13 DIAGNOSIS — F419 Anxiety disorder, unspecified: Secondary | ICD-10-CM | POA: Diagnosis not present

## 2017-12-13 DIAGNOSIS — R471 Dysarthria and anarthria: Secondary | ICD-10-CM

## 2017-12-13 DIAGNOSIS — Z79899 Other long term (current) drug therapy: Secondary | ICD-10-CM | POA: Insufficient documentation

## 2017-12-13 DIAGNOSIS — F32A Depression, unspecified: Secondary | ICD-10-CM | POA: Diagnosis present

## 2017-12-13 DIAGNOSIS — J45909 Unspecified asthma, uncomplicated: Secondary | ICD-10-CM | POA: Diagnosis present

## 2017-12-13 HISTORY — DX: Dysarthria and anarthria: R47.1

## 2017-12-13 HISTORY — DX: Headache, unspecified: R51.9

## 2017-12-13 LAB — COMPREHENSIVE METABOLIC PANEL
ALT: 20 U/L (ref 14–54)
AST: 21 U/L (ref 15–41)
Albumin: 3.7 g/dL (ref 3.5–5.0)
Alkaline Phosphatase: 62 U/L (ref 38–126)
Anion gap: 8 (ref 5–15)
BUN: 7 mg/dL (ref 6–20)
CO2: 25 mmol/L (ref 22–32)
Calcium: 9.1 mg/dL (ref 8.9–10.3)
Chloride: 108 mmol/L (ref 101–111)
Creatinine, Ser: 0.92 mg/dL (ref 0.44–1.00)
GFR calc Af Amer: >60 mL/min (ref >60)
GFR calc non Af Amer: >60 mL/min (ref >60)
Glucose, Bld: 98 mg/dL (ref 65–99)
Potassium: 3.5 mmol/L (ref 3.5–5.1)
Sodium: 141 mmol/L (ref 135–145)
Total Bilirubin: 0.6 mg/dL (ref 0.3–1.2)
Total Protein: 6.6 g/dL (ref 6.5–8.1)

## 2017-12-13 LAB — I-STAT BETA HCG BLOOD, ED (MC, WL, AP ONLY): I-stat hCG, quantitative: <5 m[IU]/mL (ref <5)

## 2017-12-13 LAB — I-STAT TROPONIN, ED: Troponin i, poc: 0 ng/mL (ref 0.00–0.08)

## 2017-12-13 LAB — I-STAT CHEM 8, ED
BUN: 7 mg/dL (ref 6–20)
Calcium, Ion: 1.12 mmol/L — ABNORMAL LOW (ref 1.15–1.40)
Chloride: 105 mmol/L (ref 101–111)
Creatinine, Ser: 0.8 mg/dL (ref 0.44–1.00)
Glucose, Bld: 93 mg/dL (ref 65–99)
HCT: 39 % (ref 36.0–46.0)
Hemoglobin: 13.3 g/dL (ref 12.0–15.0)
Potassium: 3.4 mmol/L — ABNORMAL LOW (ref 3.5–5.1)
Sodium: 142 mmol/L (ref 135–145)
TCO2: 26 mmol/L (ref 22–32)

## 2017-12-13 LAB — DIFFERENTIAL
Abs Immature Granulocytes: 0 10*3/uL (ref 0.0–0.1)
Basophils Absolute: 0.1 10*3/uL (ref 0.0–0.1)
Basophils Relative: 1 %
Eosinophils Absolute: 0.3 10*3/uL (ref 0.0–0.7)
Eosinophils Relative: 2 %
Immature Granulocytes: 0 %
Lymphocytes Relative: 50 %
Lymphs Abs: 5.1 10*3/uL — ABNORMAL HIGH (ref 0.7–4.0)
Monocytes Absolute: 0.6 10*3/uL (ref 0.1–1.0)
Monocytes Relative: 5 %
Neutro Abs: 4.4 10*3/uL (ref 1.7–7.7)
Neutrophils Relative %: 42 %

## 2017-12-13 LAB — CBC
HCT: 39.4 % (ref 36.0–46.0)
Hemoglobin: 12.4 g/dL (ref 12.0–15.0)
MCH: 26.7 pg (ref 26.0–34.0)
MCHC: 31.5 g/dL (ref 30.0–36.0)
MCV: 84.9 fL (ref 78.0–100.0)
Platelets: 311 10*3/uL (ref 150–400)
RBC: 4.64 MIL/uL (ref 3.87–5.11)
RDW: 13.2 % (ref 11.5–15.5)
WBC: 10.5 10*3/uL (ref 4.0–10.5)

## 2017-12-13 LAB — PROTIME-INR
INR: 1.05
Prothrombin Time: 13.6 seconds (ref 11.4–15.2)

## 2017-12-13 LAB — ETHANOL: Alcohol, Ethyl (B): <10 mg/dL (ref <10)

## 2017-12-13 LAB — APTT: aPTT: 34 seconds (ref 24–36)

## 2017-12-13 MED ORDER — DIAZEPAM 5 MG PO TABS
5.0000 mg | ORAL_TABLET | Freq: Three times a day (TID) | ORAL | Status: DC | PRN
  Administered 2017-12-14: 5 mg via ORAL
  Filled 2017-12-13: qty 1

## 2017-12-13 MED ORDER — ACETAMINOPHEN 160 MG/5ML PO SOLN: 650.0000 mg | ORAL | Status: DC | PRN

## 2017-12-13 MED ORDER — STROKE: EARLY STAGES OF RECOVERY BOOK
Freq: Once | Status: DC
  Filled 2017-12-13: qty 1

## 2017-12-13 MED ORDER — ENOXAPARIN SODIUM 40 MG/0.4ML ~~LOC~~ SOLN
40.0000 mg | Freq: Every day | SUBCUTANEOUS | Status: DC
  Administered 2017-12-14: 40 mg via SUBCUTANEOUS
  Filled 2017-12-13: qty 0.4

## 2017-12-13 MED ORDER — IPRATROPIUM-ALBUTEROL 0.5-2.5 (3) MG/3ML IN SOLN: 3.0000 mL | Freq: Four times a day (QID) | RESPIRATORY_TRACT | Status: DC | PRN

## 2017-12-13 MED ORDER — SODIUM CHLORIDE 0.9 % IV SOLN: 250.0000 mL | INTRAVENOUS | Status: DC | PRN

## 2017-12-13 MED ORDER — SENNOSIDES-DOCUSATE SODIUM 8.6-50 MG PO TABS: 1.0000 | ORAL_TABLET | Freq: Every evening | ORAL | Status: DC | PRN

## 2017-12-13 MED ORDER — MOMETASONE FURO-FORMOTEROL FUM 200-5 MCG/ACT IN AERO
2.0000 | INHALATION_SPRAY | Freq: Two times a day (BID) | RESPIRATORY_TRACT | Status: DC
  Filled 2017-12-13: qty 8.8

## 2017-12-13 MED ORDER — ACYCLOVIR 200 MG PO CAPS
200.0000 mg | ORAL_CAPSULE | Freq: Two times a day (BID) | ORAL | Status: DC
  Administered 2017-12-14 (×2): 200 mg via ORAL
  Filled 2017-12-13 (×3): qty 1

## 2017-12-13 MED ORDER — ACETAMINOPHEN 650 MG RE SUPP: 650.0000 mg | RECTAL | Status: DC | PRN

## 2017-12-13 MED ORDER — VITAMIN B-12 100 MCG PO TABS
100.0000 ug | ORAL_TABLET | Freq: Every day | ORAL | Status: DC
  Administered 2017-12-14: 100 ug via ORAL
  Filled 2017-12-13: qty 1

## 2017-12-13 MED ORDER — ACETAMINOPHEN 325 MG PO TABS
650.0000 mg | ORAL_TABLET | ORAL | Status: DC | PRN
  Administered 2017-12-14: 650 mg via ORAL
  Filled 2017-12-13: qty 2

## 2017-12-13 MED ORDER — FUROSEMIDE 20 MG PO TABS
80.0000 mg | ORAL_TABLET | Freq: Every day | ORAL | Status: DC
  Administered 2017-12-14: 80 mg via ORAL
  Filled 2017-12-13: qty 4

## 2017-12-13 MED ORDER — METHOCARBAMOL 500 MG PO TABS: 1000.0000 mg | ORAL_TABLET | Freq: Three times a day (TID) | ORAL | Status: DC | PRN

## 2017-12-13 MED ORDER — DIPHENHYDRAMINE HCL 25 MG PO CAPS: 25.0000 mg | ORAL_CAPSULE | Freq: Two times a day (BID) | ORAL | Status: DC | PRN

## 2017-12-13 MED ORDER — LORAZEPAM 2 MG/ML IJ SOLN
INTRAMUSCULAR | Status: AC
  Administered 2017-12-13: 1 mg
  Filled 2017-12-13: qty 1

## 2017-12-13 MED ORDER — ARIPIPRAZOLE 10 MG PO TABS
10.0000 mg | ORAL_TABLET | Freq: Every day | ORAL | Status: DC
  Filled 2017-12-13: qty 1

## 2017-12-13 MED ORDER — POTASSIUM CHLORIDE CRYS ER 20 MEQ PO TBCR
20.0000 meq | EXTENDED_RELEASE_TABLET | Freq: Two times a day (BID) | ORAL | Status: DC
  Administered 2017-12-14 (×2): 20 meq via ORAL
  Filled 2017-12-13 (×2): qty 1

## 2017-12-13 MED ORDER — LEVOTHYROXINE SODIUM 50 MCG PO TABS
50.0000 ug | ORAL_TABLET | Freq: Every day | ORAL | Status: DC
  Administered 2017-12-14: 50 ug via ORAL
  Filled 2017-12-13: qty 1

## 2017-12-13 MED ORDER — SODIUM CHLORIDE 0.9% FLUSH: 3.0000 mL | INTRAVENOUS | Status: DC | PRN

## 2017-12-13 MED ORDER — MELOXICAM 7.5 MG PO TABS
15.0000 mg | ORAL_TABLET | Freq: Every day | ORAL | Status: DC
  Administered 2017-12-14: 15 mg via ORAL
  Filled 2017-12-13: qty 2

## 2017-12-13 MED ORDER — ASPIRIN 325 MG PO TABS
325.0000 mg | ORAL_TABLET | Freq: Every day | ORAL | Status: DC
  Administered 2017-12-14: 325 mg via ORAL
  Filled 2017-12-13: qty 1

## 2017-12-13 MED ORDER — GADOBENATE DIMEGLUMINE 529 MG/ML IV SOLN
20.0000 mL | Freq: Once | INTRAVENOUS | Status: AC | PRN
  Administered 2017-12-13: 20 mL via INTRAVENOUS

## 2017-12-13 MED ORDER — ASPIRIN 300 MG RE SUPP: 300.0000 mg | Freq: Every day | RECTAL | Status: DC

## 2017-12-13 MED ORDER — AMITRIPTYLINE HCL 25 MG PO TABS
75.0000 mg | ORAL_TABLET | Freq: Every day | ORAL | Status: DC
  Administered 2017-12-14: 75 mg via ORAL
  Filled 2017-12-13: qty 3

## 2017-12-13 MED ORDER — SODIUM CHLORIDE 0.9% FLUSH
3.0000 mL | Freq: Two times a day (BID) | INTRAVENOUS | Status: DC
  Administered 2017-12-14 (×2): 3 mL via INTRAVENOUS

## 2017-12-13 MED ORDER — OXYCODONE HCL 5 MG PO TABS
5.0000 mg | ORAL_TABLET | ORAL | Status: DC | PRN
Start: 2017-12-13 — End: 2017-12-14
  Administered 2017-12-14 (×2): 5 mg via ORAL
  Filled 2017-12-13 (×2): qty 1

## 2017-12-13 NOTE — ED Provider Notes (Signed)
Patient placed in Quick Look pathway, seen and evaluated   Chief Complaint: speech change  HPI:   Pt presents today with complaints of change in speech.  Patient notes she was on the phone earlier today when her friends were wondering if her speech change.  Patient denies any acute neurologic deficits, notes she has developed a headache this afternoon.  Patient denies any history of stroke.     ROS: headache  Physical Exam:   Gen: No distress  Neuro: Awake and Alert  Skin: Warm    Focused Exam: No cranial abnormalities noted, pupils equal round reactive to light   Initiation of care has begun. The patient has been counseled on the process, plan, and necessity for staying for the completion/evaluation, and the remainder of the medical screening examination    Rosalio LoudHedges, Namita Yearwood, PA-C 12/13/17 [place] [id number]    Miller, Brian, MD 12/14/17 [phone]

## 2017-12-13 NOTE — ED Provider Notes (Signed)
[facility] EMERGENCY DEPARTMENT Provider Note   CSN: [id number] Arrival date & time: 12/13/17  4098     History   Chief Complaint Chief Complaint  Patient presents with  . Aphasia    HPI Brianna Roberts is a 52 y.o. female.  HPI  The pt is a 52 y/o female - she has hx of an immunosuppressed state and Fibromyalgia with hx of chronic migraines - but no hx of neuro sx with the migraines - she reports that when she tried to talk to family on the phone today they reported that she sounded like she was drunk and they could not understand what she was saying.  She did not feel like she was speaking abnormally.  This happened several times to different people that she was speaking to on the phone.  Finally a friend came over the house and went through the stroke scale with her making her do some motor movements including looking for facial droop, they did not see anything in her speech has gradually improved feeling like she is back to normal at this time.  Interestingly the patient has developed a bit of a headache today which is not unusual for her but she has never had any slurred speech with headaches.  She does report that at one point she was feeling ataxic and bumped into walls when she was walking and had significant difficulty putting on her bra, this is unusual for her and she usually has the manual dexterity to perform this task.  Denies fevers, stiff neck, numbness, weakness or blurred vision.  Past Medical History:  Diagnosis Date  . Allergy   . Anxiety   . Arthritis   . Asthma   . 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 Concussion   . DDD (degenerative disc disease), cervical   . Depression   . Fibromyalgia   . HIV (human immunodeficiency virus infection) (HCC)   . Homelessness   . Hypertension   . Hypothyroidism   . Insomnia   . Neuromuscular disorder (HCC)   . Thyroid disease     Patient Active Problem List   Diagnosis  Date Noted  . Dysarthria 12/13/2017  . Headache 12/13/2017  . Chronic pain syndrome 03/17/2017  . Incomplete emptying of bladder 02/22/2017  . Prediabetes 12/31/2016  . Post concussion syndrome 10/06/2016  . 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 Fibromyalgia 10/05/2016  . Benign essential HTN 10/05/2016  . Hypothyroidism 10/05/2016  . Allergic rhinitis 10/05/2016  . Chronic fatigue syndrome 10/05/2016  . Asthma, chronic 10/05/2016  . HSV (herpes simplex virus) anogenital infection 10/05/2016  . Anxiety and depression 10/05/2016  . Bilateral leg edema 10/05/2016  . Ovarian cyst 10/05/2016  . Nephrolithiasis 10/05/2016  . IBS (irritable bowel syndrome) 10/05/2016  . OSA (obstructive sleep apnea) 10/05/2016  . Left-sided weakness 10/05/2016  . Migraine 10/05/2016  . BMI 45.0-49.9, adult (HCC) 10/05/2016  . DDD (degenerative disc disease), cervical   . Insomnia     Past Surgical History:  Procedure Laterality Date  . ABDOMINAL HYSTERECTOMY     Partial-ovaries and tubes remain  . CHOLECYSTECTOMY  2006  . COLONOSCOPY  07/27/2015   [place]  . HERNIA REPAIR  1997  . TUMOR EXCISION Right 1984   Sinuses  . TUMOR EXCISION Left 2015   sinuses  . URACHAL CYST EXCISION  1997     OB History   None  Home Medications    Prior to Admission medications   Medication Sig Start Date End Date Taking? Authorizing Provider  acetaminophen (TYLENOL) 325 MG tablet Take 650 mg by mouth every 6 (six) hours as needed (for pain).   Yes [provider]  acyclovir (ZOVIRAX) 200 MG capsule Take 1 capsule (200 mg total) by mouth 2 (two) times daily. For Outbreak: 2 capsules (400 mg) TID x 5 days. Patient taking differently: Take 400 mg by mouth 2 (two) times daily.  11/01/17  Yes Jeffery, Chelle, PA-C  albuterol (PROVENTIL HFA;VENTOLIN HFA) 108 (90 Base) MCG/ACT inhaler Inhale 1-2 puffs into the lungs every 6 (six) hours as needed for wheezing or  shortness of breath. 08/09/17  Yes Jeffery, Chelle, PA-C  amitriptyline (ELAVIL) 75 MG tablet Take 1 tablet (75 mg total) by mouth at bedtime. 08/09/17  Yes Jeffery, Chelle, PA-C  amLODipine (NORVASC) 10 MG tablet Take 1 tablet (10 mg total) by mouth daily. 08/21/17  Yes Jeffery, Chelle, PA-C  ASPERCREME LIDOCAINE EX Apply 1 application topically 2 (two) times daily as needed (for muscle pain).   Yes [provider]  azelastine (ASTELIN) 0.1 % nasal spray USE 2 SPRAYS EACH NOSTRIL TWICE DAILY. 10/15/17  Yes Jeffery, Chelle, PA-C  budesonide-formoterol (SYMBICORT) 160-4.5 MCG/ACT inhaler Inhale 2 puffs into the lungs 2 (two) times daily. 12/27/16  Yes Jeffery, Chelle, PA-C  diazepam (VALIUM) 5 MG tablet Take 1 tablet (5 mg total) by mouth every 8 (eight) hours as needed for anxiety. 10/14/17  Yes Jeffery, Chelle, PA-C  diphenhydrAMINE (BENADRYL) 25 mg capsule Take 25 mg by mouth 2 (two) times daily.   Yes [provider]  fluticasone (FLONASE) 50 MCG/ACT nasal spray USE 1 SPRAY IN EACH NOSTRIL DAILY. 04/02/17  Yes Jeffery, Chelle, PA-C  furosemide (LASIX) 40 MG tablet Take 1 tablet (40 mg total) by mouth 2 (two) times daily. Patient taking differently: Take 80 mg by mouth at bedtime.  08/09/17  Yes Jeffery, Chelle, PA-C  hydrOXYzine (ATARAX/VISTARIL) 10 MG tablet Take 1 tablet (10 mg total) by mouth at bedtime. Patient taking differently: Take 10 mg by mouth at bedtime as needed (itching or angioedema).  10/05/16  Yes Porfirio Oar, PA-C  Incontinence Supply Disposable (PREVAIL WASHCLOTHS) MISC Use daily as directed 06/03/17  Yes Jeffery, Chelle, PA-C  ipratropium-albuterol (DUONEB) 0.5-2.5 (3) MG/3ML SOLN Take 3 mLs by nebulization every 6 (six) hours as needed (for breathing). Patient taking differently: Take 3 mLs by nebulization every 6 (six) hours as needed (for shortness of breath or wheezing).  10/05/16  Yes Jeffery, Chelle, PA-C  levothyroxine (SYNTHROID, LEVOTHROID) 50 MCG tablet  Take 1 tablet (50 mcg total) by mouth daily before breakfast. Patient taking differently: Take 50 mcg by mouth daily.  08/17/17  Yes Jeffery, Chelle, PA-C  losartan (COZAAR) 100 MG tablet Take 1 tablet (100 mg total) by mouth daily. 08/09/17  Yes Jeffery, Chelle, PA-C  meloxicam (MOBIC) 15 MG tablet TAKE 1 TABLET EACH DAY. 11/08/17  Yes Jeffery, Chelle, PA-C  methocarbamol (ROBAXIN) 500 MG tablet Take 2 tablets (1,000 mg total) by mouth every 8 (eight) hours as needed for muscle spasms. 02/01/17  Yes Loren Racer, MD  metoprolol tartrate (LOPRESSOR) 100 MG tablet Take 1 tablet (100 mg total) by mouth 2 (two) times daily. 08/09/17  Yes Porfirio Oar, PA-C  Nerve Stimulator (PRO COMFORT TENS UNIT) DEVI 1 Device by Does not apply route daily as needed. 12/27/16  Yes Jeffery, Chelle, PA-C  nystatin (MYCOSTATIN/NYSTOP) powder Apply topically 2 (two)  times daily. Patient taking differently: Apply 8 g topically 2 (two) times daily.  12/27/16 12/27/17 Yes Jeffery, Chelle, PA-C  nystatin cream (MYCOSTATIN) Apply 1 application topically 2 (two) times daily.   Yes [provider]  oxyCODONE (OXY IR/ROXICODONE) 5 MG immediate release tablet Take 1 tablet (5 mg total) by mouth every 4 (four) hours as needed for severe pain. 10/15/17  Yes Jeffery, Chelle, PA-C  potassium chloride SA (K-DUR,KLOR-CON) 20 MEQ tablet Take 1 tablet (20 mEq total) by mouth 2 (two) times daily. 06/24/17  Yes Jeffery, Chelle, PA-C  traMADol (ULTRAM) 50 MG tablet Take 1 tablet (50 mg total) by mouth 3 (three) times daily as needed. Patient taking differently: Take 50 mg by mouth at bedtime.  10/14/17  Yes Jeffery, Chelle, PA-C  triamcinolone ointment (KENALOG) 0.5 % Apply 1 application topically 2 (two) times daily.   Yes [provider]  vitamin B-12 (CYANOCOBALAMIN) 100 MCG tablet Take 100 mcg by mouth daily.   Yes [provider]  ARIPiprazole (ABILIFY) 10 MG tablet Take 1 tablet (10 mg total) by mouth daily. 10/05/16    Porfirio Oar, PA-C  glucose blood test strip Use as instructed 04/17/17   Porfirio Oar, PA-C  Lancets Thin MISC Use to check home glucose daily 03/28/17   Porfirio Oar, PA-C    Family History Family History  Problem Relation Age of Onset  . Diabetes Mother   . Heart disease Mother   . Hyperlipidemia Mother   . Hypertension Mother   . Stroke Mother   . Mental illness Mother   . Heart failure Mother   . Headache Mother        Joellyn Quails Cyst  . Breast cancer Mother 6  . Heart disease Father   . Alcohol abuse Father   . Hypertension Sister   . Heart disease Brother   . Mental illness Brother   . COPD Brother   . Heart failure Brother   . Hypertension Sister   . Colon cancer Neg Hx     Social History Social History   Tobacco Use  . Smoking status: Current Every Day Smoker    Packs/day: 0.30    Years: 33.00    Pack years: 9.90    Types: Cigarettes  . Smokeless tobacco: Never Used  Substance Use Topics  . Alcohol use: No  . Drug use: No     Allergies   Iodinated diagnostic agents; Sulfa antibiotics; Codeine; Dilaudid [hydromorphone hcl]; Penicillins; Spironolactone; Adhesive [tape]; and Nickel   Review of Systems Review of Systems  All other systems reviewed and are negative.    Physical Exam Updated Vital Signs BP 119/71   Pulse 75   Temp 98.4 F (36.9 C)   Resp 15   SpO2 97%   Physical Exam  Constitutional: She appears well-developed and well-nourished. No distress.  HENT:  Head: Normocephalic and atraumatic.  Mouth/Throat: Oropharynx is clear and moist. No oropharyngeal exudate.  Eyes: Pupils are equal, round, and reactive to light. Conjunctivae and EOM are normal. Right eye exhibits no discharge. Left eye exhibits no discharge. No scleral icterus.  Neck: Normal range of motion. Neck supple. No JVD present. No thyromegaly present.  Cardiovascular: Normal rate, regular rhythm, normal heart sounds and intact distal pulses. Exam reveals no  gallop and no friction rub.  No murmur heard. Pulmonary/Chest: Effort normal and breath sounds normal. No respiratory distress. She has no wheezes. She has no rales.  Abdominal: Soft. Bowel sounds are normal. She exhibits no  distension and no mass. There is no tenderness.  Musculoskeletal: Normal range of motion. She exhibits no edema or tenderness.  Lymphadenopathy:    She has no cervical adenopathy.  Neurological: She is alert. Coordination normal.  The patient is alert and oriented and able to follow all of my commands.  She does have pain in the right side of her body from her "fibromyalgia flareup".  But is able to perform equal grips, able to lift both of her legs with normal strength, no facial droop, cranial nerves III through XII are normal, speech is very clear and goal-directed, she is able to perform finger-nose-finger and has no limb ataxia.  Skin: Skin is warm and dry. No rash noted. No erythema.  Psychiatric: She has a normal mood and affect. Her behavior is normal.  Nursing note and vitals reviewed.    ED Treatments / Results  Labs (all labs ordered are listed, but only abnormal results are displayed) Labs Reviewed  DIFFERENTIAL - Abnormal; Notable for the following components:      Result Value   Lymphs Abs 5.1 (*)    All other components within normal limits  I-STAT CHEM 8, ED - Abnormal; Notable for the following components:   Potassium 3.4 (*)    Calcium, Ion 1.12 (*)    All other components within normal limits  PROTIME-INR  APTT  CBC  COMPREHENSIVE METABOLIC PANEL  ETHANOL  RAPID URINE DRUG SCREEN, HOSP PERFORMED  URINALYSIS, ROUTINE W REFLEX MICROSCOPIC  I-STAT TROPONIN, ED  I-STAT BETA HCG BLOOD, ED ([facility], [facility], [facility] ONLY)    EKG EKG Interpretation  Date/Time:  Friday December 13 2017 18:45:56 EDT Ventricular Rate:  84 PR Interval:  156 QRS Duration: 86 QT Interval:  392 QTC Calculation: 463 R Axis:   58 Text Interpretation:  Normal sinus rhythm  Nonspecific T wave abnormality Abnormal ECG No old tracing to compare Confirmed by Eber HongMiller, Kenzleigh Sedam ([id number]) on 12/13/2017 8:08:57 PM   Radiology Ct Head Wo Contrast  Result Date: 12/13/2017 CLINICAL DATA:  52 year old female with headache and abnormal speech beginning today. EXAM: CT HEAD WITHOUT CONTRAST TECHNIQUE: Contiguous axial images were obtained from the base of the skull through the vertex without intravenous contrast. COMPARISON:  None. FINDINGS: Brain: Partially empty sella (series 6, image 30). Cerebral volume is within normal limits for age. No midline shift, ventriculomegaly, mass effect, evidence of mass lesion, intracranial hemorrhage or evidence of cortically based acute infarction. Gray-white matter differentiation is within normal limits throughout the brain. Vascular: No suspicious intracranial vascular hyperdensity. Skull: No acute osseous abnormality identified. Sinuses/Orbits: Visualized paranasal sinuses and mastoids are well pneumatized. Incidental right frontal sinus osteoma (benign, series 4, image 27). Other: Visualized orbits and scalp soft tissues are within normal limits. IMPRESSION: No acute intracranial abnormality. Normal noncontrast CT appearance of the brain aside from partially empty sella - often a normal anatomic variant but can be associated with idiopathic intracranial hypertension (pseudotumor cerebri). Electronically Signed   By: Odessa FlemingH  Hall M.D.   On: 12/13/2017 19:36    Procedures Procedures (including critical care time)  Medications Ordered in ED Medications  gadobenate dimeglumine (MULTIHANCE) injection 20 mL (has no administration in time range)  LORazepam (ATIVAN) 2 MG/ML injection (1 mg  Given 12/13/17 2229)     Initial Impression / Assessment and Plan / ED Course  I have reviewed the triage vital signs and the nursing notes.  Pertinent labs & imaging results that were available during my care of the patient were reviewed  by me and considered in my  medical decision making (see chart for details).     The patient's symptoms are definitely concerning for possible TIA or stroke or could possibly be related to a complicated migraine though this would be a diagnosis of exclusion.  CT scan is negative, labs are unremarkable, the patient seems to be back to normal with her speech and her neurologic exam is reassuring.  I will discuss her care with neurology, proceed with MRI, patient's vital signs are unremarkable at this time.  She is compliant with her medications  Gust the case with Dr. Otelia Limes of the neurology service who recommends that the patient needs an inpatient TIA stroke work-up.  He will see the patient in consultation and agrees with MRA of the patient's brain, MRI of the patient's brain without contrast.  At this time the patient speech has improved and there is no focal neurologic deficits, the patient is not a candidate for thrombolytic therapy  Hospitalist agrees to admit. D/w Dr. Antionette Char - will admit  Vitals:   12/13/17 2030 12/13/17 2045 12/13/17 2100 12/13/17 2101  BP: 130/81 121/77 103/80   Pulse: 78 80 75   Resp: 13 19 15    Temp:    98.4 F (36.9 C)  TempSrc:      SpO2: 97% 100% 99%      Final Clinical Impressions(s) / ED Diagnoses   Final diagnoses:  TIA (transient ischemic attack)      Eber Hong, MD 12/13/17 2340

## 2017-12-13 NOTE — ED Triage Notes (Signed)
Hx of migraine headache

## 2017-12-13 NOTE — H&P (Signed)
History and Physical    Brianna Roberts ZOX:[id number] DOB: 21-Jul-1965 DOA: 12/13/2017  PCP: Porfirio Oar, PA-C   Patient coming from: Home  Chief Complaint: Transient dysarthria, headache, blurred vision   HPI: Brianna Roberts is a 52 y.o. female with medical history significant for depression with anxiety, persistent asthma, chronic migraines, chronic pain, hypertension, and hypothyroidism, now presenting to the emergency department for evaluation of dysarthria, headache, and blurred vision.  Patient reports a history of chronic headaches and was having a headache with blurred vision earlier today.  She reports that multiple people that she was speaking to on the phone today noted her speech to be slurred.  She also noted difficulty with balance today that seem to be transient.  A concerned friend came to see her and did not appreciate any facial droop or gait abnormality.  She denies history of similar symptoms with her prior headaches.  She denies recent fall or trauma, denies alcohol or illicit substance use, and denies recent fevers or chills.  She reports that the symptoms seem to be improving.  ED Course: Upon arrival to the ED, patient is found to be afebrile, saturating well on room air, and with vitals otherwise normal.  EKG features a sinus rhythm with diffuse T wave abnormality.  Noncontrast head CT is negative for acute intracranial abnormality.  Chemistry panel and CBC are unremarkable, troponin undetectable, and ethanol undetectable.  Neurology was consulted by the ED physician and recommended medical admission for TIA/CVA work-up.  Review of Systems:  All other systems reviewed and apart from HPI, are negative.  Past Medical History:  Diagnosis Date  . Allergy   . Anxiety   . Arthritis   . Asthma   . 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 Concussion   . DDD (degenerative disc disease), cervical   . Depression   . Fibromyalgia   . HIV (human  immunodeficiency virus infection) (HCC)   . Homelessness   . Hypertension   . Hypothyroidism   . Insomnia   . Neuromuscular disorder (HCC)   . Thyroid disease     Past Surgical History:  Procedure Laterality Date  . ABDOMINAL HYSTERECTOMY     Partial-ovaries and tubes remain  . CHOLECYSTECTOMY  2006  . COLONOSCOPY  07/27/2015   [place]  . HERNIA REPAIR  1997  . TUMOR EXCISION Right 1984   Sinuses  . TUMOR EXCISION Left 2015   sinuses  . URACHAL CYST EXCISION  1997     reports that she has been smoking cigarettes.  She has a 9.90 pack-year smoking history. She has never used smokeless tobacco. She reports that she does not drink alcohol or use drugs.  Allergies  Allergen Reactions  . Iodinated Diagnostic Agents Anaphylaxis and Other (See Comments)    Patient complained of throat/ uvula swelling s/p injection on 02/01/2017 but states she isnt allergic to the dye and has had several times prior   . Sulfa Antibiotics Anaphylaxis  . Codeine Other (See Comments)    Unknown - patient said mother told her she was allergic as a child  . Dilaudid [Hydromorphone Hcl] Itching  . Penicillins Other (See Comments)    Told she was allergic from childhood: Has patient had a PCN reaction causing immediate rash, facial/tongue/throat swelling, SOB or lightheadedness with hypotension: Unk Has patient had a PCN reaction causing severe rash involving mucus membranes or skin necrosis: Unk Has patient had a PCN reaction that required hospitalization: Unk Has patient  had a PCN reaction occurring within the last 10 years: No If all of the above answers are "NO", then may proceed with Cephalosporin use.  Marland Kitchen. Spironolactone Other (See Comments)    "GI Issues"  . Adhesive [Tape] Rash  . Nickel Rash    Family History  Problem Relation Age of Onset  . Diabetes Mother   . Heart disease Mother   . Hyperlipidemia Mother   . Hypertension Mother   . Stroke Mother   . Mental illness Mother   .  Heart failure Mother   . Headache Mother        [name]  . Breast cancer Mother [id number]  . Heart disease Father   . Alcohol abuse Father   . Hypertension Sister   . Heart disease Brother   . Mental illness Brother   . COPD Brother   . Heart failure Brother   . Hypertension Sister   . Colon cancer Neg Hx      Prior to Admission medications   Medication Sig Start Date End Date Taking? Authorizing Provider  acetaminophen (TYLENOL) 325 MG tablet Take 650 mg by mouth every 6 (six) hours as needed (for pain).   Yes [provider]  acyclovir (ZOVIRAX) 200 MG capsule Take 1 capsule (200 mg total) by mouth 2 (two) times daily. For Outbreak: 2 capsules (400 mg) TID x 5 days. Patient taking differently: Take 400 mg by mouth 2 (two) times daily.  11/01/17  Yes Jeffery, Chelle, PA-C  albuterol (PROVENTIL HFA;VENTOLIN HFA) 108 (90 Base) MCG/ACT inhaler Inhale 1-2 puffs into the lungs every 6 (six) hours as needed for wheezing or shortness of breath. 08/09/17  Yes Jeffery, Chelle, PA-C  amitriptyline (ELAVIL) 75 MG tablet Take 1 tablet (75 mg total) by mouth at bedtime. 08/09/17  Yes Jeffery, Chelle, PA-C  amLODipine (NORVASC) 10 MG tablet Take 1 tablet (10 mg total) by mouth daily. 08/21/17  Yes Jeffery, Chelle, PA-C  ASPERCREME LIDOCAINE EX Apply 1 application topically 2 (two) times daily as needed (for muscle pain).   Yes [provider]  azelastine (ASTELIN) 0.1 % nasal spray USE 2 SPRAYS EACH NOSTRIL TWICE DAILY. 10/15/17  Yes Jeffery, Chelle, PA-C  budesonide-formoterol (SYMBICORT) 160-4.5 MCG/ACT inhaler Inhale 2 puffs into the lungs 2 (two) times daily. 12/27/16  Yes Jeffery, Chelle, PA-C  diazepam (VALIUM) 5 MG tablet Take 1 tablet (5 mg total) by mouth every 8 (eight) hours as needed for anxiety. 10/14/17  Yes Jeffery, Chelle, PA-C  diphenhydrAMINE (BENADRYL) 25 mg capsule Take 25 mg by mouth 2 (two) times daily.   Yes [provider]  fluticasone (FLONASE) 50  MCG/ACT nasal spray USE 1 SPRAY IN EACH NOSTRIL DAILY. 04/02/17  Yes Jeffery, Chelle, PA-C  furosemide (LASIX) 40 MG tablet Take 1 tablet (40 mg total) by mouth 2 (two) times daily. Patient taking differently: Take 80 mg by mouth at bedtime.  08/09/17  Yes Jeffery, Chelle, PA-C  hydrOXYzine (ATARAX/VISTARIL) 10 MG tablet Take 1 tablet (10 mg total) by mouth at bedtime. Patient taking differently: Take 10 mg by mouth at bedtime as needed (itching or angioedema).  10/05/16  Yes Porfirio OarJeffery, Chelle, PA-C  Incontinence Supply Disposable (PREVAIL WASHCLOTHS) MISC Use daily as directed 06/03/17  Yes Jeffery, Chelle, PA-C  ipratropium-albuterol (DUONEB) 0.5-2.5 (3) MG/3ML SOLN Take 3 mLs by nebulization every 6 (six) hours as needed (for breathing). Patient taking differently: Take 3 mLs by nebulization every 6 (six) hours as needed (for shortness of breath or wheezing).  10/05/16  Yes Jeffery, Chelle, PA-C  levothyroxine (SYNTHROID, LEVOTHROID) 50 MCG tablet Take 1 tablet (50 mcg total) by mouth daily before breakfast. Patient taking differently: Take 50 mcg by mouth daily.  08/17/17  Yes Jeffery, Chelle, PA-C  losartan (COZAAR) 100 MG tablet Take 1 tablet (100 mg total) by mouth daily. 08/09/17  Yes Jeffery, Chelle, PA-C  meloxicam (MOBIC) 15 MG tablet TAKE 1 TABLET EACH DAY. 11/08/17  Yes Jeffery, Chelle, PA-C  methocarbamol (ROBAXIN) 500 MG tablet Take 2 tablets (1,000 mg total) by mouth every 8 (eight) hours as needed for muscle spasms. 02/01/17  Yes Loren Racer, MD  metoprolol tartrate (LOPRESSOR) 100 MG tablet Take 1 tablet (100 mg total) by mouth 2 (two) times daily. 08/09/17  Yes Porfirio Oar, PA-C  Nerve Stimulator (PRO COMFORT TENS UNIT) DEVI 1 Device by Does not apply route daily as needed. 12/27/16  Yes Jeffery, Chelle, PA-C  nystatin (MYCOSTATIN/NYSTOP) powder Apply topically 2 (two) times daily. Patient taking differently: Apply 8 g topically 2 (two) times daily.  12/27/16 12/27/17 Yes Jeffery, Chelle,  PA-C  nystatin cream (MYCOSTATIN) Apply 1 application topically 2 (two) times daily.   Yes [provider]  oxyCODONE (OXY IR/ROXICODONE) 5 MG immediate release tablet Take 1 tablet (5 mg total) by mouth every 4 (four) hours as needed for severe pain. 10/15/17  Yes Jeffery, Chelle, PA-C  potassium chloride SA (K-DUR,KLOR-CON) 20 MEQ tablet Take 1 tablet (20 mEq total) by mouth 2 (two) times daily. 06/24/17  Yes Jeffery, Chelle, PA-C  traMADol (ULTRAM) 50 MG tablet Take 1 tablet (50 mg total) by mouth 3 (three) times daily as needed. Patient taking differently: Take 50 mg by mouth at bedtime.  10/14/17  Yes Jeffery, Chelle, PA-C  triamcinolone ointment (KENALOG) 0.5 % Apply 1 application topically 2 (two) times daily.   Yes [provider]  vitamin B-12 (CYANOCOBALAMIN) 100 MCG tablet Take 100 mcg by mouth daily.   Yes [provider]  ARIPiprazole (ABILIFY) 10 MG tablet Take 1 tablet (10 mg total) by mouth daily. 10/05/16   Porfirio Oar, PA-C  glucose blood test strip Use as instructed 04/17/17   Porfirio Oar, PA-C  Lancets Thin MISC Use to check home glucose daily 03/28/17   Porfirio Oar, PA-C    Physical Exam: Vitals:   12/13/17 2045 12/13/17 2100 12/13/17 2101 12/13/17 2145  BP: 121/77 103/80  119/71  Pulse: 80 75  75  Resp: 19 15  15   Temp:   98.4 F (36.9 C)   TempSrc:      SpO2: 100% 99%  97%      Constitutional: NAD, obese  Eyes: PERTLA, lids and conjunctivae normal ENMT: Mucous membranes are moist. Posterior pharynx clear of any exudate or lesions.   Neck: normal, supple, no masses, no thyromegaly Respiratory: clear to auscultation bilaterally, no wheezing, no crackles. Normal respiratory effort.    Cardiovascular: S1 & S2 heard, regular rate and rhythm. Trace pretibial edema bilaterally. Abdomen: No distension, no tenderness, soft. Bowel sounds normal.  Musculoskeletal: no clubbing / cyanosis. No joint deformity upper and lower extremities.     Skin: no significant rashes, lesions, ulcers. Warm, dry, well-perfused. Neurologic: CN 2-12 grossly intact. Sensation intact. Strength 5/5 in all 4 limbs.  Psychiatric: Alert and oriented x 3. Calm, cooperative.     Labs on Admission: I have personally reviewed following labs and imaging studies  CBC: Recent Labs  Lab 12/13/17 1857 12/13/17 1913  WBC 10.5  --   NEUTROABS 4.4  --  HGB 12.4 13.3  HCT 39.4 39.0  MCV 84.9  --   PLT 311  --    Basic Metabolic Panel: Recent Labs  Lab 12/13/17 1857 12/13/17 1913  NA 141 142  K 3.5 3.4*  CL 108 105  CO2 25  --   GLUCOSE 98 93  BUN 7 7  CREATININE 0.92 0.80  CALCIUM 9.1  --    GFR: CrCl cannot be calculated (Unknown ideal weight.). Liver Function Tests: Recent Labs  Lab 12/13/17 1857  AST 21  ALT 20  ALKPHOS 62  BILITOT 0.6  PROT 6.6  ALBUMIN 3.7   No results for input(s): LIPASE, AMYLASE in the last 168 hours. No results for input(s): AMMONIA in the last 168 hours. Coagulation Profile: Recent Labs  Lab 12/13/17 1857  INR 1.05   Cardiac Enzymes: No results for input(s): CKTOTAL, CKMB, CKMBINDEX, TROPONINI in the last 168 hours. BNP (last 3 results) No results for input(s): PROBNP in the last 8760 hours. HbA1C: No results for input(s): HGBA1C in the last 72 hours. CBG: No results for input(s): GLUCAP in the last 168 hours. Lipid Profile: No results for input(s): CHOL, HDL, LDLCALC, TRIG, CHOLHDL, LDLDIRECT in the last 72 hours. Thyroid Function Tests: No results for input(s): TSH, T4TOTAL, FREET4, T3FREE, THYROIDAB in the last 72 hours. Anemia Panel: No results for input(s): VITAMINB12, FOLATE, FERRITIN, TIBC, IRON, RETICCTPCT in the last 72 hours. Urine analysis:    Component Value Date/Time   COLORURINE AMBER (A) 02/01/2017 1405   APPEARANCEUR Clear 11/01/2017 1100   LABSPEC 1.030 02/01/2017 1405   PHURINE 6.0 02/01/2017 1405   GLUCOSEU Negative 11/01/2017 1100   HGBUR NEGATIVE 02/01/2017 1405     BILIRUBINUR Negative 11/01/2017 1100   KETONESUR negative 10/18/2017 1029   KETONESUR NEGATIVE 02/01/2017 1405   PROTEINUR Negative 11/01/2017 1100   PROTEINUR 30 (A) 02/01/2017 1405   UROBILINOGEN 0.2 10/18/2017 1029   NITRITE Negative 11/01/2017 1100   NITRITE NEGATIVE 02/01/2017 1405   LEUKOCYTESUR Negative 11/01/2017 1100   Sepsis Labs: @LABRCNTIP (procalcitonin:4,lacticidven:4) )No results found for this or any previous visit (from the past 240 hour(s)).   Radiological Exams on Admission: Ct Head Wo Contrast  Result Date: 12/13/2017 CLINICAL DATA:  52 year old female with headache and abnormal speech beginning today. EXAM: CT HEAD WITHOUT CONTRAST TECHNIQUE: Contiguous axial images were obtained from the base of the skull through the vertex without intravenous contrast. COMPARISON:  None. FINDINGS: Brain: Partially empty sella (series 6, image 30). Cerebral volume is within normal limits for age. No midline shift, ventriculomegaly, mass effect, evidence of mass lesion, intracranial hemorrhage or evidence of cortically based acute infarction. Gray-white matter differentiation is within normal limits throughout the brain. Vascular: No suspicious intracranial vascular hyperdensity. Skull: No acute osseous abnormality identified. Sinuses/Orbits: Visualized paranasal sinuses and mastoids are well pneumatized. Incidental right frontal sinus osteoma (benign, series 4, image 27). Other: Visualized orbits and scalp soft tissues are within normal limits. IMPRESSION: No acute intracranial abnormality. Normal noncontrast CT appearance of the brain aside from partially empty sella - often a normal anatomic variant but can be associated with idiopathic intracranial hypertension (pseudotumor cerebri). Electronically Signed   By: Odessa Fleming M.D.   On: 12/13/2017 19:36    EKG: Independently reviewed. Sinus rhythm, diffuse T-wave abnormality, no prior available.   Assessment/Plan   1. Transient dysarthria  and vision disturbance  - Presents with headache, reporting dysarthria and blurred vision that have improved  - Head CT negative for acute intracranial abnormality, no  focal findings identified  - MRI brain and MRA head and neck ordered  - Continue cardiac monitoring and frequent neuro checks, PT/OT/SLP evals, check echocardiogram, fasting lipids, A1c   2. Chronic pain  - Continue home regimen with Mobic, Elavil, prn Robaxin, prn oxycodone    3. Depression with anxiety  - Continue Abilify, Elavil, prn Valium   4. Hypertension - BP at goal   - Hold antihypertensives while evaluating for possible acute ischemic CVA    5. Hypothyroidism  - Continue Synthroid    6. Asthma  - No wheezing or dyspnea on admission  - Continue ICS/LABA and prn nebs     DVT prophylaxis: Lovenox  Code Status: Full  Family Communication: Discussed with patient Consults called: Neurology Admission status: Observation    Briscoe Deutscher, MD [facility] Pager [phone]  If 7PM-7AM, please contact night-coverage [email or web address] Password [facility]  12/13/2017, 11:46 PM

## 2017-12-13 NOTE — ED Notes (Signed)
Patient transported to MRI 

## 2017-12-13 NOTE — ED Notes (Signed)
etoh needs to be redrawn in green/black tube, per lab etoh drawn was in wrong tube.

## 2017-12-13 NOTE — ED Triage Notes (Signed)
The pt has fibromyalgia  And at 1500 people told her that her speech sounded funny  She also has a headache and she has blurred vision

## 2017-12-14 ENCOUNTER — Observation Stay (HOSPITAL_BASED_OUTPATIENT_CLINIC_OR_DEPARTMENT_OTHER): Payer: Medicare HMO

## 2017-12-14 DIAGNOSIS — G43109 Migraine with aura, not intractable, without status migrainosus: Secondary | ICD-10-CM

## 2017-12-14 DIAGNOSIS — G894 Chronic pain syndrome: Secondary | ICD-10-CM

## 2017-12-14 DIAGNOSIS — F329 Major depressive disorder, single episode, unspecified: Secondary | ICD-10-CM | POA: Diagnosis not present

## 2017-12-14 DIAGNOSIS — I5030 Unspecified diastolic (congestive) heart failure: Secondary | ICD-10-CM

## 2017-12-14 DIAGNOSIS — I10 Essential (primary) hypertension: Secondary | ICD-10-CM | POA: Diagnosis not present

## 2017-12-14 DIAGNOSIS — R471 Dysarthria and anarthria: Secondary | ICD-10-CM

## 2017-12-14 DIAGNOSIS — E039 Hypothyroidism, unspecified: Secondary | ICD-10-CM | POA: Diagnosis not present

## 2017-12-14 DIAGNOSIS — R69 Illness, unspecified: Secondary | ICD-10-CM | POA: Diagnosis not present

## 2017-12-14 DIAGNOSIS — F419 Anxiety disorder, unspecified: Secondary | ICD-10-CM | POA: Diagnosis not present

## 2017-12-14 LAB — URINALYSIS, ROUTINE W REFLEX MICROSCOPIC
Bilirubin Urine: NEGATIVE
Glucose, UA: NEGATIVE mg/dL
Hgb urine dipstick: NEGATIVE
KETONES UR: NEGATIVE mg/dL
LEUKOCYTES UA: NEGATIVE
NITRITE: NEGATIVE
PROTEIN: NEGATIVE mg/dL
Specific Gravity, Urine: 1.031 — ABNORMAL HIGH (ref 1.005–1.030)
pH: 5 (ref 5.0–8.0)

## 2017-12-14 LAB — LIPID PANEL
Cholesterol: 128 mg/dL (ref 0–200)
HDL: 35 mg/dL — AB (ref >40)
LDL Cholesterol: 54 mg/dL (ref 0–99)
TRIGLYCERIDES: 196 mg/dL — AB (ref <150)
Total CHOL/HDL Ratio: 3.7 RATIO
VLDL: 39 mg/dL (ref 0–40)

## 2017-12-14 LAB — RAPID URINE DRUG SCREEN, HOSP PERFORMED
Amphetamines: NOT DETECTED
Benzodiazepines: POSITIVE — AB
COCAINE: NOT DETECTED
Opiates: NOT DETECTED
Tetrahydrocannabinol: NOT DETECTED

## 2017-12-14 LAB — HEMOGLOBIN A1C
Hgb A1c MFr Bld: 6.2 % — ABNORMAL HIGH (ref 4.8–5.6)
MEAN PLASMA GLUCOSE: 131.24 mg/dL

## 2017-12-14 LAB — ECHOCARDIOGRAM COMPLETE

## 2017-12-14 MED ORDER — KETOROLAC TROMETHAMINE 30 MG/ML IJ SOLN
30.0000 mg | Freq: Four times a day (QID) | INTRAMUSCULAR | Status: AC | PRN
  Administered 2017-12-14 (×2): 30 mg via INTRAVENOUS
  Filled 2017-12-14 (×2): qty 1

## 2017-12-14 NOTE — Care Management Note (Signed)
Case Management Note  Patient Details  Name: Brianna Roberts MRN: [id number] Date of Birth: 1965/07/27  Subjective/Objective: Pt presented for transient dysarthria, headache and blurred vision. [place] [facility] Pt has used [facility] in the past and wants to utilize again.                Action/Plan: CM did make Referral with Liaison Delila Spenceory [facility]. SOC to begin within 24-48 hours post transition home. CSW aware to assist with transportation home. CM did text page [facility] to [facility]H orders. No further needs from CM at this time.   Expected Discharge Date:  12/14/17               Expected Discharge Plan:  Home w Home Health Services  In-House Referral:  Clinical Social Work(to assist with transportation home. )  Discharge planning Services  CM Consult  Post Acute Care Choice:  Home Health Choice offered to:  Patient  DME Arranged:  N/A DME Agency:  NA  HH Arranged:  RN, Disease Management, PT, OT, Nurse's Aide HH [facility]  [facility]  Status of Service:  Completed, signed off  If discussed at Long Length of Stay Meetings, dates discussed:    Additional Comments:  Gala LewandowskyGraves-Bigelow, Dartanion Teo Kaye, RN 12/14/2017, 12:30 PM

## 2017-12-14 NOTE — Evaluation (Signed)
Physical Therapy Evaluation Patient Details Name: Brianna DimitriCrystal Norsworthy MRN: [id number] DOB: 15-Apr-1966 Today's Date: 12/14/2017   History of Present Illness  Brianna Roberts is a 52 y.o. female with a history of migraine headaches, who presented with migraine headache occurring in conjunction with garbled speech. Other PMH includes HIV, anxiety, allergies, fibromyalgia. MRI neg for acute changes.   Clinical Impression  Pt admitted with above diagnosis. Pt currently with functional limitations due to the deficits listed below (see PT Problem List). Pt limited with mobility at this time, ambulated 30' with RW and min A and had to turn around at that point due to fatigue and pain.  Pt will benefit from skilled PT to increase their independence and safety with mobility to allow discharge to the venue listed below.       Follow Up Recommendations Home health PT;Supervision - Intermittent    Equipment Recommendations  None recommended by PT    Recommendations for Other Services       Precautions / Restrictions Precautions Precautions: Fall Precaution Comments: she reports she has a long history of falling out of the bed Restrictions Weight Bearing Restrictions: No      Mobility  Bed Mobility Overal bed mobility: Needs Assistance Bed Mobility: Supine to Sit     Supine to sit: Supervision     General bed mobility comments: use of rail to get to edge  Transfers Overall transfer level: Needs assistance Equipment used: Rolling walker (2 wheeled) Transfers: Sit to/from Stand Sit to Stand: Min guard         General transfer comment: stands in unsafe fashion by sliding r hip off bed, twisting around to L and supporting self on foot board and comint o standing facing the bed. No LOB but pt verbalizes that she knows her movement patterns are not always safe but has to do what she can to manage her pain  Ambulation/Gait Ambulation/Gait assistance: Min assist Gait Distance (Feet): 30  Feet Assistive device: Rolling walker (2 wheeled) Gait Pattern/deviations: Wide base of support;Trunk flexed Gait velocity: decreased Gait velocity interpretation: <1.31 ft/sec, indicative of household ambulator General Gait Details: pt needed to turn around after 15' due to extreme fatigue as well as L hip and knee pain (pt leaning to L to compensate for RLQ pain).   Stairs            Wheelchair Mobility    Modified Rankin (Stroke Patients Only) Modified Rankin (Stroke Patients Only) Pre-Morbid Rankin Score: Slight disability Modified Rankin: Moderate disability     Balance Overall balance assessment: Needs assistance Sitting-balance support: No upper extremity supported;Feet supported Sitting balance-Leahy Scale: Good     Standing balance support: Bilateral upper extremity supported Standing balance-Leahy Scale: Poor Standing balance comment: needs UE support to maintain standing.                              Pertinent Vitals/Pain Pain Assessment: 0-10 Pain Score: 9  Pain Location: ribcage, legs, Rt arm and pelvis Pain Descriptors / Indicators: Sharp;Sore Pain Intervention(s): Limited activity within patient's tolerance;Monitored during session    Home Living Family/patient expects to be discharged to:: Private residence Living Arrangements: Alone   Type of Home: Apartment Home Access: Stairs to enter Entrance Stairs-Rails: None Entrance Stairs-Number of Steps: 3 Home Layout: Two level Home Equipment: Emergency planning/management officerhower seat;Walker - 4 wheels      Prior Function Level of Independence: Independent with assistive device(s)  Comments: had difficulty with ADL's due to fatigue. Uses rollator to ambulate.       Hand Dominance        Extremity/Trunk Assessment   Upper Extremity Assessment Upper Extremity Assessment: Defer to OT evaluation    Lower Extremity Assessment Lower Extremity Assessment: Generalized weakness    Cervical / Trunk  Assessment Cervical / Trunk Assessment: Normal  Communication   Communication: No difficulties  Cognition Arousal/Alertness: Awake/alert Behavior During Therapy: WFL for tasks assessed/performed Overall Cognitive Status: Within Functional Limits for tasks assessed                                 General Comments: seemed to have decreased safety awareness.      General Comments General comments (skin integrity, edema, etc.): discussed the difficulty that stairs poses but pt adamant that she go home and that she will do better in her home environment    Exercises     Assessment/Plan    PT Assessment Patient needs continued PT services  PT Problem List Decreased strength;Decreased activity tolerance;Decreased balance;Decreased mobility;Decreased knowledge of use of DME;Decreased knowledge of precautions;Pain       PT Treatment Interventions DME instruction;Gait training;Stair training;Functional mobility training;Therapeutic activities;Therapeutic exercise;Balance training;Patient/family education    PT Goals (Current goals can be found in the Care Plan section)  Acute Rehab PT Goals Patient Stated Goal: to work again PT Goal Formulation: With patient Time For Goal Achievement: 12/28/17 Potential to Achieve Goals: Good    Frequency Min 3X/week   Barriers to discharge Decreased caregiver support lives alone    Co-evaluation PT/OT/SLP Co-Evaluation/Treatment: Yes Reason for Co-Treatment: For patient/therapist safety PT goals addressed during session: Mobility/safety with mobility;Strengthening/ROM;Proper use of DME OT goals addressed during session: ADL's and self-care       AM-PAC PT "6 Clicks" Daily Activity  Outcome Measure Difficulty turning over in bed (including adjusting bedclothes, sheets and blankets)?: A Little Difficulty moving from lying on back to sitting on the side of the bed? : A Little Difficulty sitting down on and standing up from a  chair with arms (e.g., wheelchair, bedside commode, etc,.)?: A Little Help needed moving to and from a bed to chair (including a wheelchair)?: A Little Help needed walking in hospital room?: A Little Help needed climbing 3-5 steps with a railing? : A Lot 6 Click Score: 17    End of Session   Activity Tolerance: Patient limited by fatigue;Patient limited by pain Patient left: in chair;with call bell/phone within reach Nurse Communication: Mobility status PT Visit Diagnosis: Unsteadiness on feet (R26.81);Muscle weakness (generalized) (M62.81);Pain Pain - Right/Left: Right    Time: 7829-5621 PT Time Calculation (min) (ACUTE ONLY): 37 min   Charges:   PT Evaluation $PT Eval Moderate Complexity: 1 Mod     PT G Codes:        Lyanne Co, PT  Acute Rehab Services  [phone]   [place] L Shaunessy Dobratz 12/14/2017, 12:15 PM

## 2017-12-14 NOTE — Progress Notes (Signed)
Pt discharge education and instructions completed with pt and she voices understanding and denies any questions. Pt IV and telemetry removed; pt discharge home and [employer] taxi called to pick up pt to transport to disposition. Dionne BucyP. Amo Ita Fritzsche RN

## 2017-12-14 NOTE — ED Notes (Signed)
Pt returned from [facility]  Admitting doctor at the bedside

## 2017-12-14 NOTE — Discharge Summary (Signed)
Physician Discharge Summary  Brianna Roberts ZOX:[id number] DOB: 01/24/1966 DOA: 12/13/2017  PCP: Porfirio Oar, PA-C  Admit date: 12/13/2017 Discharge date: 12/14/2017  Admitted From: Home  Disposition:  Home   Recommendations for Outpatient Follow-up:  1. Follow up with PCP in 1-2 weeks   Home Health: Yes  Equipment/Devices: Shower seat, walker  Discharge Condition: Fair  CODE STATUS: FULL Diet recommendation: Regular  Brief/Interim Summary: Brianna Roberts is a 52 y.o. F with HTN, obesity as well as chronic pain, fibromyalgia, migraines and depression who presents with garbled speech and headache.   Per admission notes: "Symptoms started at 3:45 when she was on the telephone with [employer] and the rep could not understand what she was saying. She then called a friend who noted the same thing. Subsequently a third friend was called, who asked "are you okay, I can't understand what you are saying". The patient states the above occurred over a 15 minute interval, towards the end of which she developed left ocular migraine pain, her typical pattern for pain, which was 8/10. She then sought evaluation at the ED. Her speech has returned to normal since admission, but headache has waxed and waned and is now about 6/10 in intensity. She also complains of fibromyalgia pain and pain from a kidney stone. She had no other neurological deficits with this episode."         Complicated migraine The time course and associated left sided location of headache, suggests that her symptoms were from a cortical spreading depression through her speech centers and then resolution in context of migraine.  Her MRI, carotid, and echo imaging were unremarkable.  However, given low risks of aspirin treatment, it was felt that adding aspirin 81 mg daily was reasonable.      Discharge Diagnoses:  Principal Problem:   Dysarthria Active Problems:   Benign essential HTN   Hypothyroidism   Asthma, chronic    Anxiety and depression   Chronic pain syndrome   Headache    Discharge Instructions  Discharge Instructions    Diet - low sodium heart healthy   Complete by:  As directed    Discharge instructions   Complete by:  As directed    From Dr. [name]: You were admitted with trouble speaking and headache and vision changes.   These appear to have been from complicated migraine. Your MRI shows no stroke, and no evidence of significant fatty plaque (or atherosclerosis) in your arteries.  Your heart monitoring here showed no abnormal heart rhythms or heart stopping.   Your ultrasound of the heart (echocardiogram) was normal still.    Resume your home medicines As we discussed, although we cannot say definitely that you had a TIA or "mini-stroke", it would be reasonable to start taking a baby aspirin 81 once daily.  The risks of bleeding exist, but are low compared to the benefits this would provide for someone with risk factors for stoke as you have (high blood pressure, family history).  Follow up with Porfirio Oar when you are able.   Increase activity slowly   Complete by:  As directed      Allergies as of 12/14/2017      Reactions   Iodinated Diagnostic Agents Anaphylaxis, Other (See Comments)   Patient complained of throat/ uvula swelling s/p injection on 02/01/2017 but states she isnt allergic to the dye and has had several times prior    Sulfa Antibiotics Anaphylaxis   Codeine Other (See Comments)   Unknown - patient said  mother told her she was allergic as a child   Dilaudid [hydromorphone Hcl] Itching   Penicillins Other (See Comments)   Told she was allergic from childhood: Has patient had a PCN reaction causing immediate rash, facial/tongue/throat swelling, SOB or lightheadedness with hypotension: Unk Has patient had a PCN reaction causing severe rash involving mucus membranes or skin necrosis: Unk Has patient had a PCN reaction that required hospitalization: Unk Has  patient had a PCN reaction occurring within the last 10 years: No If all of the above answers are "NO", then may proceed with Cephalosporin use.   Spironolactone Other (See Comments)   "GI Issues"   Adhesive [tape] Rash   Nickel Rash      Medication List    TAKE these medications   acetaminophen 325 MG tablet Commonly known as:  TYLENOL Take 650 mg by mouth every 6 (six) hours as needed (for pain).   acyclovir 200 MG capsule Commonly known as:  ZOVIRAX Take 1 capsule (200 mg total) by mouth 2 (two) times daily. For Outbreak: 2 capsules (400 mg) TID x 5 days. What changed:    how much to take  additional instructions   albuterol 108 (90 Base) MCG/ACT inhaler Commonly known as:  PROVENTIL HFA;VENTOLIN HFA Inhale 1-2 puffs into the lungs every 6 (six) hours as needed for wheezing or shortness of breath.   amitriptyline 75 MG tablet Commonly known as:  ELAVIL Take 1 tablet (75 mg total) by mouth at bedtime.   amLODipine 10 MG tablet Commonly known as:  NORVASC Take 1 tablet (10 mg total) by mouth daily.   ARIPiprazole 10 MG tablet Commonly known as:  ABILIFY Take 1 tablet (10 mg total) by mouth daily.   ASPERCREME LIDOCAINE EX Apply 1 application topically 2 (two) times daily as needed (for muscle pain).   azelastine 0.1 % nasal spray Commonly known as:  ASTELIN USE 2 SPRAYS EACH NOSTRIL TWICE DAILY.   budesonide-formoterol 160-4.5 MCG/ACT inhaler Commonly known as:  SYMBICORT Inhale 2 puffs into the lungs 2 (two) times daily.   diazepam 5 MG tablet Commonly known as:  VALIUM Take 1 tablet (5 mg total) by mouth every 8 (eight) hours as needed for anxiety.   diphenhydrAMINE 25 mg capsule Commonly known as:  BENADRYL Take 25 mg by mouth 2 (two) times daily.   fluticasone 50 MCG/ACT nasal spray Commonly known as:  FLONASE USE 1 SPRAY IN EACH NOSTRIL DAILY.   furosemide 40 MG tablet Commonly known as:  LASIX Take 1 tablet (40 mg total) by mouth 2 (two) times  daily. What changed:    how much to take  when to take this   glucose blood test strip Use as instructed   hydrOXYzine 10 MG tablet Commonly known as:  ATARAX/VISTARIL Take 1 tablet (10 mg total) by mouth at bedtime. What changed:    when to take this  reasons to take this   ipratropium-albuterol 0.5-2.5 (3) MG/3ML Soln Commonly known as:  DUONEB Take 3 mLs by nebulization every 6 (six) hours as needed (for breathing). What changed:  reasons to take this   Lancets Thin Misc Use to check home glucose daily   levothyroxine 50 MCG tablet Commonly known as:  SYNTHROID, LEVOTHROID Take 1 tablet (50 mcg total) by mouth daily before breakfast. What changed:  when to take this   losartan 100 MG tablet Commonly known as:  COZAAR Take 1 tablet (100 mg total) by mouth daily.   meloxicam 15 MG tablet  Commonly known as:  MOBIC TAKE 1 TABLET EACH DAY.   methocarbamol 500 MG tablet Commonly known as:  ROBAXIN Take 2 tablets (1,000 mg total) by mouth every 8 (eight) hours as needed for muscle spasms.   metoprolol tartrate 100 MG tablet Commonly known as:  LOPRESSOR Take 1 tablet (100 mg total) by mouth 2 (two) times daily.   nystatin cream Commonly known as:  MYCOSTATIN Apply 1 application topically 2 (two) times daily. What changed:  Another medication with the same name was changed. Make sure you understand how and when to take each.   nystatin powder Commonly known as:  MYCOSTATIN/NYSTOP Apply topically 2 (two) times daily. What changed:  how much to take   oxyCODONE 5 MG immediate release tablet Commonly known as:  Oxy IR/ROXICODONE Take 1 tablet (5 mg total) by mouth every 4 (four) hours as needed for severe pain.   potassium chloride SA 20 MEQ tablet Commonly known as:  K-DUR,KLOR-CON Take 1 tablet (20 mEq total) by mouth 2 (two) times daily.   PREVAIL WASHCLOTHS Misc Use daily as directed   PRO COMFORT TENS UNIT Devi 1 Device by Does not apply route daily  as needed.   traMADol 50 MG tablet Commonly known as:  ULTRAM Take 1 tablet (50 mg total) by mouth 3 (three) times daily as needed. What changed:  when to take this   triamcinolone ointment 0.5 % Commonly known as:  KENALOG Apply 1 application topically 2 (two) times daily.   vitamin B-12 100 MCG tablet Commonly known as:  CYANOCOBALAMIN Take 100 mcg by mouth daily.      Follow-up Information    [facility] Follow up.   Specialty:  Home Health Services Why:  Registered Nurse, Aide, Physical / Occupational Therapy Contact information: [address] [place] [address] [phone]          Allergies  Allergen Reactions  . Iodinated Diagnostic Agents Anaphylaxis and Other (See Comments)    Patient complained of throat/ uvula swelling s/p injection on 02/01/2017 but states she isnt allergic to the dye and has had several times prior   . Sulfa Antibiotics Anaphylaxis  . Codeine Other (See Comments)    Unknown - patient said mother told her she was allergic as a child  . Dilaudid [Hydromorphone Hcl] Itching  . Penicillins Other (See Comments)    Told she was allergic from childhood: Has patient had a PCN reaction causing immediate rash, facial/tongue/throat swelling, SOB or lightheadedness with hypotension: Unk Has patient had a PCN reaction causing severe rash involving mucus membranes or skin necrosis: Unk Has patient had a PCN reaction that required hospitalization: Unk Has patient had a PCN reaction occurring within the last 10 years: No If all of the above answers are "NO", then may proceed with Cephalosporin use.  Marland Kitchen Spironolactone Other (See Comments)    "GI Issues"  . Adhesive [Tape] Rash  . Nickel Rash    Consultations:  Neurology   Procedures/Studies: Ct Head Wo Contrast  Result Date: 12/13/2017 CLINICAL DATA:  52 year old female with headache and abnormal speech beginning today. EXAM: CT HEAD WITHOUT CONTRAST TECHNIQUE:  Contiguous axial images were obtained from the base of the skull through the vertex without intravenous contrast. COMPARISON:  None. FINDINGS: Brain: Partially empty sella (series 6, image 30). Cerebral volume is within normal limits for age. No midline shift, ventriculomegaly, mass effect, evidence of mass lesion, intracranial hemorrhage or evidence of cortically based acute infarction. Gray-white matter  differentiation is within normal limits throughout the brain. Vascular: No suspicious intracranial vascular hyperdensity. Skull: No acute osseous abnormality identified. Sinuses/Orbits: Visualized paranasal sinuses and mastoids are well pneumatized. Incidental right frontal sinus osteoma (benign, series 4, image 27). Other: Visualized orbits and scalp soft tissues are within normal limits. IMPRESSION: No acute intracranial abnormality. Normal noncontrast CT appearance of the brain aside from partially empty sella - often a normal anatomic variant but can be associated with idiopathic intracranial hypertension (pseudotumor cerebri). Electronically Signed   By: Odessa Fleming M.D.   On: 12/13/2017 19:36   Mr Maxine Glenn Head Wo Contrast  Result Date: 12/14/2017 CLINICAL DATA:  Initial evaluation for acute speech difficulty. EXAM: MRI HEAD WITHOUT CONTRAST MRA HEAD WITHOUT CONTRAST MRA NECK WITHOUT AND WITH CONTRAST TECHNIQUE: Multiplanar, multiecho pulse sequences of the brain and surrounding structures were obtained without intravenous contrast. Angiographic images of the Circle of Willis were obtained using MRA technique without intravenous contrast. Angiographic images of the neck were obtained using MRA technique without and with intravenous contrast. Carotid stenosis measurements (when applicable) are obtained utilizing NASCET criteria, using the distal internal carotid diameter as the denominator. CONTRAST:  20mL MULTIHANCE GADOBENATE DIMEGLUMINE 529 MG/ML IV SOLN COMPARISON:  Prior head CT from earlier the same day.  FINDINGS: MRI HEAD FINDINGS Cerebral volume within normal limits. No focal parenchymal signal abnormality. No abnormal foci of restricted diffusion to suggest acute or subacute ischemia. Gray-white matter differentiation maintained. No encephalomalacia to suggest chronic infarction. No acute or chronic intracranial hemorrhage. No mass lesion, midline shift or mass effect. No hydrocephalus. No extra-axial fluid collection. Incidental note made of a partially empty sella. Major intracranial vascular flow voids maintained. Mild cerebellar tonsillar ectopia without frank Chiari malformation. Craniocervical junction otherwise unremarkable. No focal marrow replacing lesion. Scalp soft tissues unremarkable. Globes and orbital soft tissues within normal limits. Paranasal sinuses are clear. Trace opacity right mastoid air cells, of doubtful significance. Inner ear structures normal. MRA HEAD FINDINGS ANTERIOR CIRCULATION: Distal cervical segments of the internal carotid arteries are widely patent with antegrade flow. Petrous, cavernous, and supraclinoid segments widely patent bilaterally without stenosis. Origin of the ophthalmic arteries patent. ICA termini widely patent. A1 segments, anterior communicating artery, and anterior cerebral arteries widely patent bilaterally. M1 segments widely patent without stenosis. Normal MCA bifurcations. Distal MCA branches well perfused and symmetric. POSTERIOR CIRCULATION: Vertebral arteries code dominant and widely patent to the vertebrobasilar junction. Right PICA patent. Left PICA not visualized. Basilar widely patent to its distal aspect. Superior cerebral arteries patent bilaterally. Fetal type origin of the left PCA. Hypoplastic right P1 with prominent right posterior communicating artery. PCAs widely patent to their distal aspects. No aneurysm. MRA NECK FINDINGS Source images reviewed. Visualized aortic arch of normal caliber with normal 3 vessel morphology. No flow-limiting  stenosis about the origin of the great vessels. Visualized subclavian arteries widely patent. Right common and internal carotid arteries widely patent without stenosis or occlusion. No atheromatous narrowing about the right carotid bifurcation. Left common and internal carotid arteries widely patent without stenosis or occlusion. No atheromatous narrowing about the left carotid bifurcation. Both of the vertebral arteries arise from the subclavian arteries. Vertebral arteries widely patent within the neck without stenosis or occlusion. IMPRESSION: 1. Normal brain MRI. No acute intracranial infarct or other abnormality identified. 2. Normal intracranial MRA. 3. Normal MRA of the neck. Electronically Signed   By: Rise Mu M.D.   On: 12/14/2017 00:49   Mr Angiogram Neck W Or Wo Contrast  Result  Date: 12/14/2017 CLINICAL DATA:  Initial evaluation for acute speech difficulty. EXAM: MRI HEAD WITHOUT CONTRAST MRA HEAD WITHOUT CONTRAST MRA NECK WITHOUT AND WITH CONTRAST TECHNIQUE: Multiplanar, multiecho pulse sequences of the brain and surrounding structures were obtained without intravenous contrast. Angiographic images of the Circle of Willis were obtained using MRA technique without intravenous contrast. Angiographic images of the neck were obtained using MRA technique without and with intravenous contrast. Carotid stenosis measurements (when applicable) are obtained utilizing NASCET criteria, using the distal internal carotid diameter as the denominator. CONTRAST:  20mL MULTIHANCE GADOBENATE DIMEGLUMINE 529 MG/ML IV SOLN COMPARISON:  Prior head CT from earlier the same day. FINDINGS: MRI HEAD FINDINGS Cerebral volume within normal limits. No focal parenchymal signal abnormality. No abnormal foci of restricted diffusion to suggest acute or subacute ischemia. Gray-white matter differentiation maintained. No encephalomalacia to suggest chronic infarction. No acute or chronic intracranial hemorrhage. No mass  lesion, midline shift or mass effect. No hydrocephalus. No extra-axial fluid collection. Incidental note made of a partially empty sella. Major intracranial vascular flow voids maintained. Mild cerebellar tonsillar ectopia without frank Chiari malformation. Craniocervical junction otherwise unremarkable. No focal marrow replacing lesion. Scalp soft tissues unremarkable. Globes and orbital soft tissues within normal limits. Paranasal sinuses are clear. Trace opacity right mastoid air cells, of doubtful significance. Inner ear structures normal. MRA HEAD FINDINGS ANTERIOR CIRCULATION: Distal cervical segments of the internal carotid arteries are widely patent with antegrade flow. Petrous, cavernous, and supraclinoid segments widely patent bilaterally without stenosis. Origin of the ophthalmic arteries patent. ICA termini widely patent. A1 segments, anterior communicating artery, and anterior cerebral arteries widely patent bilaterally. M1 segments widely patent without stenosis. Normal MCA bifurcations. Distal MCA branches well perfused and symmetric. POSTERIOR CIRCULATION: Vertebral arteries code dominant and widely patent to the vertebrobasilar junction. Right PICA patent. Left PICA not visualized. Basilar widely patent to its distal aspect. Superior cerebral arteries patent bilaterally. Fetal type origin of the left PCA. Hypoplastic right P1 with prominent right posterior communicating artery. PCAs widely patent to their distal aspects. No aneurysm. MRA NECK FINDINGS Source images reviewed. Visualized aortic arch of normal caliber with normal 3 vessel morphology. No flow-limiting stenosis about the origin of the great vessels. Visualized subclavian arteries widely patent. Right common and internal carotid arteries widely patent without stenosis or occlusion. No atheromatous narrowing about the right carotid bifurcation. Left common and internal carotid arteries widely patent without stenosis or occlusion. No  atheromatous narrowing about the left carotid bifurcation. Both of the vertebral arteries arise from the subclavian arteries. Vertebral arteries widely patent within the neck without stenosis or occlusion. IMPRESSION: 1. Normal brain MRI. No acute intracranial infarct or other abnormality identified. 2. Normal intracranial MRA. 3. Normal MRA of the neck. Electronically Signed   By: Rise Mu M.D.   On: 12/14/2017 00:49   Mr Brain Wo Contrast  Result Date: 12/14/2017 CLINICAL DATA:  Initial evaluation for acute speech difficulty. EXAM: MRI HEAD WITHOUT CONTRAST MRA HEAD WITHOUT CONTRAST MRA NECK WITHOUT AND WITH CONTRAST TECHNIQUE: Multiplanar, multiecho pulse sequences of the brain and surrounding structures were obtained without intravenous contrast. Angiographic images of the Circle of Willis were obtained using MRA technique without intravenous contrast. Angiographic images of the neck were obtained using MRA technique without and with intravenous contrast. Carotid stenosis measurements (when applicable) are obtained utilizing NASCET criteria, using the distal internal carotid diameter as the denominator. CONTRAST:  20mL MULTIHANCE GADOBENATE DIMEGLUMINE 529 MG/ML IV SOLN COMPARISON:  Prior head CT from earlier the  same day. FINDINGS: MRI HEAD FINDINGS Cerebral volume within normal limits. No focal parenchymal signal abnormality. No abnormal foci of restricted diffusion to suggest acute or subacute ischemia. Gray-white matter differentiation maintained. No encephalomalacia to suggest chronic infarction. No acute or chronic intracranial hemorrhage. No mass lesion, midline shift or mass effect. No hydrocephalus. No extra-axial fluid collection. Incidental note made of a partially empty sella. Major intracranial vascular flow voids maintained. Mild cerebellar tonsillar ectopia without frank Chiari malformation. Craniocervical junction otherwise unremarkable. No focal marrow replacing lesion. Scalp  soft tissues unremarkable. Globes and orbital soft tissues within normal limits. Paranasal sinuses are clear. Trace opacity right mastoid air cells, of doubtful significance. Inner ear structures normal. MRA HEAD FINDINGS ANTERIOR CIRCULATION: Distal cervical segments of the internal carotid arteries are widely patent with antegrade flow. Petrous, cavernous, and supraclinoid segments widely patent bilaterally without stenosis. Origin of the ophthalmic arteries patent. ICA termini widely patent. A1 segments, anterior communicating artery, and anterior cerebral arteries widely patent bilaterally. M1 segments widely patent without stenosis. Normal MCA bifurcations. Distal MCA branches well perfused and symmetric. POSTERIOR CIRCULATION: Vertebral arteries code dominant and widely patent to the vertebrobasilar junction. Right PICA patent. Left PICA not visualized. Basilar widely patent to its distal aspect. Superior cerebral arteries patent bilaterally. Fetal type origin of the left PCA. Hypoplastic right P1 with prominent right posterior communicating artery. PCAs widely patent to their distal aspects. No aneurysm. MRA NECK FINDINGS Source images reviewed. Visualized aortic arch of normal caliber with normal 3 vessel morphology. No flow-limiting stenosis about the origin of the great vessels. Visualized subclavian arteries widely patent. Right common and internal carotid arteries widely patent without stenosis or occlusion. No atheromatous narrowing about the right carotid bifurcation. Left common and internal carotid arteries widely patent without stenosis or occlusion. No atheromatous narrowing about the left carotid bifurcation. Both of the vertebral arteries arise from the subclavian arteries. Vertebral arteries widely patent within the neck without stenosis or occlusion. IMPRESSION: 1. Normal brain MRI. No acute intracranial infarct or other abnormality identified. 2. Normal intracranial MRA. 3. Normal MRA of the  neck. Electronically Signed   By: Rise Mu M.D.   On: 12/14/2017 00:49   Echocardiogram Study Conclusions  - Left ventricle: The cavity size was normal. Systolic function was   normal. The estimated ejection fraction was in the range of 60%   to 65%. Wall motion was normal; there were no regional wall   motion abnormalities. Doppler parameters are consistent with   abnormal left ventricular relaxation (grade 1 diastolic   dysfunction).    Subjective: Feeling well.  No further visual changes, no garbled speech, slurred speech.  No focal weakness.  No headaache.  Discharge Exam: Vitals:   12/14/17 0757 12/14/17 1233  BP: 99/66 134/85  Pulse: 86 98  Resp: 20 15  Temp: 97.9 F (36.6 C) 98.6 F (37 C)  SpO2: 95% 99%   Vitals:   12/14/17 0451 12/14/17 0600 12/14/17 0757 12/14/17 1233  BP: 93/67 103/61 99/66 134/85  Pulse: 88  86 98  Resp:   20 15  Temp: 97.9 F (36.6 C) 98 F (36.7 C) 97.9 F (36.6 C) 98.6 F (37 C)  TempSrc: Oral Oral    SpO2: 94% 91% 95% 99%    General: Pt is alert, awake, not in acute distress Cardiovascular: RRR, S1/S2 +, no rubs, no gallops Respiratory: CTA bilaterally, no wheezing, no rhonchi Abdominal: Soft, NT, ND, bowel sounds + Extremities: no edema, no cyanosis  The results of significant diagnostics from this hospitalization (including imaging, microbiology, ancillary and laboratory) are listed below for reference.     Microbiology: No results found for this or any previous visit (from the past 240 hour(s)).   Labs: BNP (last 3 results) No results for input(s): BNP in the last 8760 hours. Basic Metabolic Panel: Recent Labs  Lab 12/13/17 1857 12/13/17 1913  NA 141 142  K 3.5 3.4*  CL 108 105  CO2 25  --   GLUCOSE 98 93  BUN 7 7  CREATININE 0.92 0.80  CALCIUM 9.1  --    Liver Function Tests: Recent Labs  Lab 12/13/17 1857  AST 21  ALT 20  ALKPHOS 62  BILITOT 0.6  PROT 6.6  ALBUMIN 3.7   No results  for input(s): LIPASE, AMYLASE in the last 168 hours. No results for input(s): AMMONIA in the last 168 hours. CBC: Recent Labs  Lab 12/13/17 1857 12/13/17 1913  WBC 10.5  --   NEUTROABS 4.4  --   HGB 12.4 13.3  HCT 39.4 39.0  MCV 84.9  --   PLT 311  --    Cardiac Enzymes: No results for input(s): CKTOTAL, CKMB, CKMBINDEX, TROPONINI in the last 168 hours. BNP: Invalid input(s): POCBNP CBG: No results for input(s): GLUCAP in the last 168 hours. D-Dimer No results for input(s): DDIMER in the last 72 hours. Hgb A1c Recent Labs    12/14/17 0516  HGBA1C 6.2*   Lipid Profile Recent Labs    12/14/17 0516  CHOL 128  HDL 35*  LDLCALC 54  TRIG 657*  CHOLHDL 3.7   Thyroid function studies No results for input(s): TSH, T4TOTAL, T3FREE, THYROIDAB in the last 72 hours.  Invalid input(s): FREET3 Anemia work up No results for input(s): VITAMINB12, FOLATE, FERRITIN, TIBC, IRON, RETICCTPCT in the last 72 hours. Urinalysis    Component Value Date/Time   COLORURINE YELLOW 12/14/2017 0405   APPEARANCEUR HAZY (A) 12/14/2017 0405   APPEARANCEUR Clear 11/01/2017 1100   LABSPEC 1.031 (H) 12/14/2017 0405   PHURINE 5.0 12/14/2017 0405   GLUCOSEU NEGATIVE 12/14/2017 0405   HGBUR NEGATIVE 12/14/2017 0405   BILIRUBINUR NEGATIVE 12/14/2017 0405   BILIRUBINUR Negative 11/01/2017 1100   KETONESUR NEGATIVE 12/14/2017 0405   PROTEINUR NEGATIVE 12/14/2017 0405   UROBILINOGEN 0.2 10/18/2017 1029   NITRITE NEGATIVE 12/14/2017 0405   LEUKOCYTESUR NEGATIVE 12/14/2017 0405   LEUKOCYTESUR Negative 11/01/2017 1100   Sepsis Labs Invalid input(s): PROCALCITONIN,  WBC,  LACTICIDVEN Microbiology No results found for this or any previous visit (from the past 240 hour(s)).   Time coordinating discharge: 50 minutes       SIGNED:   Alberteen Sam, MD  [facility] 12/14/2017, 5:04 PM

## 2017-12-14 NOTE — Social Work (Signed)
CSW consulted for transportation for pt.  Cab voucher provided to [address] [place]  [phone]  as per verbal consult from RN Case Manager pt has no funds and is unable [place].  CSW signing off. Please consult if any additional needs arise.  Doy HutchingIsabel H Autry Roberts, LCSWA [facility] Clinical Social Work [phone]

## 2017-12-14 NOTE — ED Notes (Signed)
Pt reports that her headache has gone alert oriented skin warm and dry.  She now thinks that she has a kidney stone

## 2017-12-14 NOTE — Progress Notes (Signed)
OT evaluation     12/14/17 1158  OT Visit Information  Last OT Received On 12/14/17  Assistance Needed +1 (chair follow would be helpful)  PT/OT/SLP Co-Evaluation/Treatment Yes  Reason for Co-Treatment For patient/therapist safety  OT goals addressed during session ADL's and self-care  History of Present Illness        Clinical Impression Brianna Roberts is a 52 y.o. female with a history of migraine headaches, who presented with migraine headache occurring in conjunction with garbled speech. Other PMH includes HIV, anxiety, allergies, fibromyalgia. MRI neg for acute changes.   Pt lives alone, and would benefit from additional therapy, but pt wanting to go home and not pursue any other therapy besides therapy at home. Feel pt will benefit from acute OT to increase independence prior to d/c.      Precautions  Precautions Fall  Restrictions  Weight Bearing Restrictions No  Home Living  Family/patient expects to be discharged to: Private residence  Living Arrangements Alone  Type of Home Other(Comment) (townhouse)  Home Access Stairs to enter  Entrance Stairs-Number of Steps 3  Entrance Stairs-Rails None  Home Layout Two level  Alternate Level Stairs-Number of Steps 13  Alternate Level Stairs-Rails Right  Bathroom Clinical biochemisthower/Tub Tub/shower unit  Home Equipment Shower seat;Walker - 4 wheels;ComptrollerAdaptive equipment  Adaptive Equipment Reacher  Prior Function  Level of Independence Independent with assistive device(s)  Comments able to perform ADLs but was effortful-did not bathe everyday.  Communication  Communication No difficulties (talkative)  Pain Assessment  Pain Assessment 0-10  Pain Score 9  Pain Location ribcage, legs, Rt arm and pelvis  Pain Descriptors / Indicators Sharp;Sore  Pain Intervention(s) Monitored during session  Cognition  Arousal/Alertness Awake/alert  Behavior During Therapy WFL for tasks assessed/performed  Overall Cognitive Status No  family/caregiver present to determine baseline cognitive functioning  General Comments seemed to have decreased safety awareness.  Upper Extremity Assessment  Upper Extremity Assessment Overall WFL for tasks assessed  Lower Extremity Assessment  Lower Extremity Assessment Defer to PT evaluation  ADL  Overall ADL's  Needs assistance/impaired  Lower Body Dressing Sit to/from stand;Moderate assistance  Toilet Transfer Min guard;RW;Ambulation (sit to stand from bed)  Functional mobility during ADLs Min guard  General ADL Comments Pt reports she can bring foot up on surface to help with LB dressing-however unable to fully access feet sitting in chair today. Educated on energy conservation. Used +2 assist for transfer and ambulation for safety-could have done with one person assist, but +2 would be helpful for further ambulation.  Bed Mobility  Overal bed mobility Needs Assistance  Bed Mobility Supine to Sit  Supine to sit Supervision  Transfers  Overall transfer level Needs assistance  Equipment used Rolling walker (2 wheeled)  Transfers Sit to/from Stand  Sit to Stand Min guard  General transfer comment cues for hand placement.  OT - End of Session  Equipment Utilized During Treatment Gait belt;Rolling walker  Activity Tolerance Patient limited by pain;Patient limited by fatigue  Patient left in chair;Other (comment) (with PT)  OT Assessment  OT Recommendation/Assessment Patient needs continued OT Services  OT Visit Diagnosis Pain  Pain - Right/Left  (both)  Pain - part of body Arm;Leg (ribcage, pelvis)  OT Problem List Decreased strength;Obesity;Decreased range of motion;Decreased activity tolerance;Impaired balance (sitting and/or standing);Decreased safety awareness;Pain;Decreased knowledge of use of DME or AE  OT Plan  OT Frequency (ACUTE ONLY) Min 2X/week  OT Treatment/Interventions (ACUTE ONLY) Self-care/ADL training;Energy conservation;DME and/or AE instruction;Therapeutic  activities;Cognitive remediation/compensation;Balance  training;Patient/family education  AM-PAC OT "6 Clicks" Daily Activity Outcome Measure  Help from another person eating meals? 4  Help from another person taking care of personal grooming? 3  Help from another person toileting, which includes using toliet, bedpan, or urinal? 3  Help from another person bathing (including washing, rinsing, drying)? 2  Help from another person to put on and taking off regular upper body clothing? 3  Help from another person to put on and taking off regular lower body clothing? 2  6 Click Score 17  ADL G Code Conversion CK  OT Recommendation  Follow Up Recommendations Home health OT;Supervision/Assistance - 24 hour  OT Equipment Tub/shower bench;Other (comment) (AE)  Individuals Consulted  Consulted and Agree with Results and Recommendations Patient (agreeable to Home Health therapies)  Acute Rehab OT Goals  Patient Stated Goal to work again  OT Goal Formulation With patient  Time For Goal Achievement 12/21/17  Potential to Achieve Goals Good  OT Time Calculation  OT Start Time (ACUTE ONLY) 1021  OT Stop Time (ACUTE ONLY) 1103  OT Time Calculation (min) 42 min  OT General Charges  $OT Visit 1 Visit  OT Evaluation  $OT Eval Moderate Complexity 1 Mod    Mousa Prout, OTR/L

## 2017-12-14 NOTE — Consult Note (Signed)
NEURO HOSPITALIST CONSULT NOTE   Requestig physician: Dr. Antionette Char  Reason for Consult: Garbled speech  History obtained from:   Patient and Chart     HPI:                                                                                                                                          Brianna Roberts is an 52 y.o. female with a history of migraine headaches, who presents with migraine headache occurring in conjunction with garbled speech. She has never had a neurological deficit with her headaches before. Symptoms started at 3:45 when she was on the telephone with [employer] and the rep could not understand what she was saying. She then called a friend who noted the same thing. Subsequently a third friend was called, who asked "are you okay, I can't understand what you are saying". The patient states the above occurred over a 15 minute interval, towards the end of which she developed left ocular migraine pain, her typical pattern for pain, which was 8/10. She then sought evaluation at the ED. Her speech has returned to normal since admission, but headache has waxed and waned and is now about 6/10 in intensity. She also complains of fibromyalgia pain and pain from a kidney stone. She had no other neurological deficits with this episode.   Past Medical History:  Diagnosis Date  . Allergy   . Anxiety   . Arthritis   . Asthma   . 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 Concussion   . DDD (degenerative disc disease), cervical   . Depression   . Fibromyalgia   . HIV (human immunodeficiency virus infection) (HCC)   . Homelessness   . Hypertension   . Hypothyroidism   . Insomnia   . Neuromuscular disorder (HCC)   . Thyroid disease     Past Surgical History:  Procedure Laterality Date  . ABDOMINAL HYSTERECTOMY     Partial-ovaries and tubes remain  . CHOLECYSTECTOMY  2006  . COLONOSCOPY  07/27/2015   [place]  . HERNIA REPAIR  1997  .  TUMOR EXCISION Right 1984   Sinuses  . TUMOR EXCISION Left 2015   sinuses  . URACHAL CYST EXCISION  1997    Family History  Problem Relation Age of Onset  . Diabetes Mother   . Heart disease Mother   . Hyperlipidemia Mother   . Hypertension Mother   . Stroke Mother   . Mental illness Mother   . Heart failure Mother   . Headache Mother        Joellyn Quails Cyst  . Breast cancer Mother 31  . Heart disease Father   . Alcohol abuse Father   . Hypertension Sister   . Heart disease  Brother   . Mental illness Brother   . COPD Brother   . Heart failure Brother   . Hypertension Sister   . Colon cancer Neg Hx               Social History:  reports that she has been smoking cigarettes.  She has a 9.90 pack-year smoking history. She has never used smokeless tobacco. She reports that she does not drink alcohol or use drugs.  Allergies  Allergen Reactions  . Iodinated Diagnostic Agents Anaphylaxis and Other (See Comments)    Patient complained of throat/ uvula swelling s/p injection on 02/01/2017 but states she isnt allergic to the dye and has had several times prior   . Sulfa Antibiotics Anaphylaxis  . Codeine Other (See Comments)    Unknown - patient said mother told her she was allergic as a child  . Dilaudid [Hydromorphone Hcl] Itching  . Penicillins Other (See Comments)    Told she was allergic from childhood: Has patient had a PCN reaction causing immediate rash, facial/tongue/throat swelling, SOB or lightheadedness with hypotension: Unk Has patient had a PCN reaction causing severe rash involving mucus membranes or skin necrosis: Unk Has patient had a PCN reaction that required hospitalization: Unk Has patient had a PCN reaction occurring within the last 10 years: No If all of the above answers are "NO", then may proceed with Cephalosporin use.  Marland Kitchen Spironolactone Other (See Comments)    "GI Issues"  . Adhesive [Tape] Rash  . Nickel Rash    HOME MEDICATIONS:                                                                                                                      No current facility-administered medications on file prior to encounter.    Current Outpatient Medications on File Prior to Encounter  Medication Sig Dispense Refill  . acetaminophen (TYLENOL) 325 MG tablet Take 650 mg by mouth every 6 (six) hours as needed (for pain).    Marland Kitchen acyclovir (ZOVIRAX) 200 MG capsule Take 1 capsule (200 mg total) by mouth 2 (two) times daily. For Outbreak: 2 capsules (400 mg) TID x 5 days. (Patient taking differently: Take 400 mg by mouth 2 (two) times daily. ) 200 capsule 3  . albuterol (PROVENTIL HFA;VENTOLIN HFA) 108 (90 Base) MCG/ACT inhaler Inhale 1-2 puffs into the lungs every 6 (six) hours as needed for wheezing or shortness of breath. 1 Inhaler 3  . amitriptyline (ELAVIL) 75 MG tablet Take 1 tablet (75 mg total) by mouth at bedtime. 90 tablet 3  . amLODipine (NORVASC) 10 MG tablet Take 1 tablet (10 mg total) by mouth daily. 90 tablet 3  . ASPERCREME LIDOCAINE EX Apply 1 application topically 2 (two) times daily as needed (for muscle pain).    Marland Kitchen azelastine (ASTELIN) 0.1 % nasal spray USE 2 SPRAYS EACH NOSTRIL TWICE DAILY. 30 mL 0  . budesonide-formoterol (SYMBICORT) 160-4.5 MCG/ACT inhaler Inhale 2 puffs into the lungs 2 (two) times daily.  3 Inhaler 3  . diazepam (VALIUM) 5 MG tablet Take 1 tablet (5 mg total) by mouth every 8 (eight) hours as needed for anxiety. 90 tablet 0  . diphenhydrAMINE (BENADRYL) 25 mg capsule Take 25 mg by mouth 2 (two) times daily.    . fluticasone (FLONASE) 50 MCG/ACT nasal spray USE 1 SPRAY IN EACH NOSTRIL DAILY. 16 g 0  . furosemide (LASIX) 40 MG tablet Take 1 tablet (40 mg total) by mouth 2 (two) times daily. (Patient taking differently: Take 80 mg by mouth at bedtime. ) 180 tablet 3  . hydrOXYzine (ATARAX/VISTARIL) 10 MG tablet Take 1 tablet (10 mg total) by mouth at bedtime. (Patient taking differently: Take 10 mg by mouth at bedtime as  needed (itching or angioedema). ) 90 tablet 3  . Incontinence Supply Disposable (PREVAIL WASHCLOTHS) MISC Use daily as directed 96 each PRN  . ipratropium-albuterol (DUONEB) 0.5-2.5 (3) MG/3ML SOLN Take 3 mLs by nebulization every 6 (six) hours as needed (for breathing). (Patient taking differently: Take 3 mLs by nebulization every 6 (six) hours as needed (for shortness of breath or wheezing). ) 360 mL 3  . levothyroxine (SYNTHROID, LEVOTHROID) 50 MCG tablet Take 1 tablet (50 mcg total) by mouth daily before breakfast. (Patient taking differently: Take 50 mcg by mouth daily. ) 90 tablet 3  . losartan (COZAAR) 100 MG tablet Take 1 tablet (100 mg total) by mouth daily. 90 tablet 3  . meloxicam (MOBIC) 15 MG tablet TAKE 1 TABLET EACH DAY. 90 tablet 0  . methocarbamol (ROBAXIN) 500 MG tablet Take 2 tablets (1,000 mg total) by mouth every 8 (eight) hours as needed for muscle spasms. 30 tablet 0  . metoprolol tartrate (LOPRESSOR) 100 MG tablet Take 1 tablet (100 mg total) by mouth 2 (two) times daily. 180 tablet 3  . Nerve Stimulator (PRO COMFORT TENS UNIT) DEVI 1 Device by Does not apply route daily as needed. 1 Device 1  . nystatin (MYCOSTATIN/NYSTOP) powder Apply topically 2 (two) times daily. (Patient taking differently: Apply 8 g topically 2 (two) times daily. ) 90 g prn  . nystatin cream (MYCOSTATIN) Apply 1 application topically 2 (two) times daily.    Marland Kitchen oxyCODONE (OXY IR/ROXICODONE) 5 MG immediate release tablet Take 1 tablet (5 mg total) by mouth every 4 (four) hours as needed for severe pain. 30 tablet 0  . potassium chloride SA (K-DUR,KLOR-CON) 20 MEQ tablet Take 1 tablet (20 mEq total) by mouth 2 (two) times daily. 180 tablet 0  . traMADol (ULTRAM) 50 MG tablet Take 1 tablet (50 mg total) by mouth 3 (three) times daily as needed. (Patient taking differently: Take 50 mg by mouth at bedtime. ) 90 tablet 0  . triamcinolone ointment (KENALOG) 0.5 % Apply 1 application topically 2 (two) times daily.     . vitamin B-12 (CYANOCOBALAMIN) 100 MCG tablet Take 100 mcg by mouth daily.    . ARIPiprazole (ABILIFY) 10 MG tablet Take 1 tablet (10 mg total) by mouth daily. 90 tablet 0  . glucose blood test strip Use as instructed 100 each prn  . Lancets Thin MISC Use to check home glucose daily 100 each prn    ROS:  As per HPI.    Blood pressure 93/67, pulse 88, temperature 97.9 F (36.6 C), temperature source Oral, resp. rate 15, SpO2 94 %.   General Examination:                                                                                                       Physical Exam  HEENT-  [place]/AT  Lungs- Respirations unlabored Extremities- Warm and well perfused Skin-Scaling along dorsum of hands bilaterally  Neurological Examination Mental Status: Alert, oriented, thought content appropriate.  Speech fluent with intact repetition, naming and comprehension. No dysarthria. Able to follow all commands without difficulty. Cranial Nerves: II: Visual fields intact without extinction to DSS  III,IV, VI: Ptosis not present, EOMI without nystagmus V,VII: Smile symmetric, facial temp sensation decreased on the left (states this is her baseline) VIII: hearing intact to voice IX,X: No hypophonia XI: Symmetric shoulder shrug XII: midline tongue extension Motor: Right : Upper extremity   5/5    Left:     Upper extremity   5/5  Lower extremity   5/5     Lower extremity   5/5 Normal tone throughout; no atrophy noted No pronator or parietal drift  Sensory:  Temp sensation decreased to left upper and lower extremity (states this is her baseline). No extinction  Deep Tendon Reflexes: Hypoactive in the context of prominent adiposity. No asymmetry noted.  Plantars: Right: downgoing   Left: downgoing Cerebellar: No ataxia with FNF bilaterally Gait: Deferred   Lab  Results: Basic Metabolic Panel: Recent Labs  Lab 12/13/17 1857 12/13/17 1913  NA 141 142  K 3.5 3.4*  CL 108 105  CO2 25  --   GLUCOSE 98 93  BUN 7 7  CREATININE 0.92 0.80  CALCIUM 9.1  --     CBC: Recent Labs  Lab 12/13/17 1857 12/13/17 1913  WBC 10.5  --   NEUTROABS 4.4  --   HGB 12.4 13.3  HCT 39.4 39.0  MCV 84.9  --   PLT 311  --     Cardiac Enzymes: No results for input(s): CKTOTAL, CKMB, CKMBINDEX, TROPONINI in the last 168 hours.  Lipid Panel: No results for input(s): CHOL, TRIG, HDL, CHOLHDL, VLDL, LDLCALC in the last 168 hours.  Imaging: Ct Head Wo Contrast  Result Date: 12/13/2017 CLINICAL DATA:  52 year old female with headache and abnormal speech beginning today. EXAM: CT HEAD WITHOUT CONTRAST TECHNIQUE: Contiguous axial images were obtained from the base of the skull through the vertex without intravenous contrast. COMPARISON:  None. FINDINGS: Brain: Partially empty sella (series 6, image 30). Cerebral volume is within normal limits for age. No midline shift, ventriculomegaly, mass effect, evidence of mass lesion, intracranial hemorrhage or evidence of cortically based acute infarction. Gray-white matter differentiation is within normal limits throughout the brain. Vascular: No suspicious intracranial vascular hyperdensity. Skull: No acute osseous abnormality identified. Sinuses/Orbits: Visualized paranasal sinuses and mastoids are well pneumatized. Incidental right frontal sinus osteoma (benign, series 4, image 27). Other: Visualized orbits and scalp soft tissues are within normal limits. IMPRESSION: No acute intracranial abnormality. Normal  noncontrast CT appearance of the brain aside from partially empty sella - often a normal anatomic variant but can be associated with idiopathic intracranial hypertension (pseudotumor cerebri). Electronically Signed   By: Odessa Fleming M.D.   On: 12/13/2017 19:36   Mr Maxine Glenn Head Wo Contrast  Result Date: 12/14/2017 CLINICAL DATA:   Initial evaluation for acute speech difficulty. EXAM: MRI HEAD WITHOUT CONTRAST MRA HEAD WITHOUT CONTRAST MRA NECK WITHOUT AND WITH CONTRAST TECHNIQUE: Multiplanar, multiecho pulse sequences of the brain and surrounding structures were obtained without intravenous contrast. Angiographic images of the Circle of Willis were obtained using MRA technique without intravenous contrast. Angiographic images of the neck were obtained using MRA technique without and with intravenous contrast. Carotid stenosis measurements (when applicable) are obtained utilizing NASCET criteria, using the distal internal carotid diameter as the denominator. CONTRAST:  20mL MULTIHANCE GADOBENATE DIMEGLUMINE 529 MG/ML IV SOLN COMPARISON:  Prior head CT from earlier the same day. FINDINGS: MRI HEAD FINDINGS Cerebral volume within normal limits. No focal parenchymal signal abnormality. No abnormal foci of restricted diffusion to suggest acute or subacute ischemia. Gray-white matter differentiation maintained. No encephalomalacia to suggest chronic infarction. No acute or chronic intracranial hemorrhage. No mass lesion, midline shift or mass effect. No hydrocephalus. No extra-axial fluid collection. Incidental note made of a partially empty sella. Major intracranial vascular flow voids maintained. Mild cerebellar tonsillar ectopia without frank Chiari malformation. Craniocervical junction otherwise unremarkable. No focal marrow replacing lesion. Scalp soft tissues unremarkable. Globes and orbital soft tissues within normal limits. Paranasal sinuses are clear. Trace opacity right mastoid air cells, of doubtful significance. Inner ear structures normal. MRA HEAD FINDINGS ANTERIOR CIRCULATION: Distal cervical segments of the internal carotid arteries are widely patent with antegrade flow. Petrous, cavernous, and supraclinoid segments widely patent bilaterally without stenosis. Origin of the ophthalmic arteries patent. ICA termini widely patent. A1  segments, anterior communicating artery, and anterior cerebral arteries widely patent bilaterally. M1 segments widely patent without stenosis. Normal MCA bifurcations. Distal MCA branches well perfused and symmetric. POSTERIOR CIRCULATION: Vertebral arteries code dominant and widely patent to the vertebrobasilar junction. Right PICA patent. Left PICA not visualized. Basilar widely patent to its distal aspect. Superior cerebral arteries patent bilaterally. Fetal type origin of the left PCA. Hypoplastic right P1 with prominent right posterior communicating artery. PCAs widely patent to their distal aspects. No aneurysm. MRA NECK FINDINGS Source images reviewed. Visualized aortic arch of normal caliber with normal 3 vessel morphology. No flow-limiting stenosis about the origin of the great vessels. Visualized subclavian arteries widely patent. Right common and internal carotid arteries widely patent without stenosis or occlusion. No atheromatous narrowing about the right carotid bifurcation. Left common and internal carotid arteries widely patent without stenosis or occlusion. No atheromatous narrowing about the left carotid bifurcation. Both of the vertebral arteries arise from the subclavian arteries. Vertebral arteries widely patent within the neck without stenosis or occlusion. IMPRESSION: 1. Normal brain MRI. No acute intracranial infarct or other abnormality identified. 2. Normal intracranial MRA. 3. Normal MRA of the neck. Electronically Signed   By: Rise Mu M.D.   On: 12/14/2017 00:49   Mr Angiogram Neck W Or Wo Contrast  Result Date: 12/14/2017 CLINICAL DATA:  Initial evaluation for acute speech difficulty. EXAM: MRI HEAD WITHOUT CONTRAST MRA HEAD WITHOUT CONTRAST MRA NECK WITHOUT AND WITH CONTRAST TECHNIQUE: Multiplanar, multiecho pulse sequences of the brain and surrounding structures were obtained without intravenous contrast. Angiographic images of the Circle of Willis were obtained using  MRA technique without  intravenous contrast. Angiographic images of the neck were obtained using MRA technique without and with intravenous contrast. Carotid stenosis measurements (when applicable) are obtained utilizing NASCET criteria, using the distal internal carotid diameter as the denominator. CONTRAST:  20mL MULTIHANCE GADOBENATE DIMEGLUMINE 529 MG/ML IV SOLN COMPARISON:  Prior head CT from earlier the same day. FINDINGS: MRI HEAD FINDINGS Cerebral volume within normal limits. No focal parenchymal signal abnormality. No abnormal foci of restricted diffusion to suggest acute or subacute ischemia. Gray-white matter differentiation maintained. No encephalomalacia to suggest chronic infarction. No acute or chronic intracranial hemorrhage. No mass lesion, midline shift or mass effect. No hydrocephalus. No extra-axial fluid collection. Incidental note made of a partially empty sella. Major intracranial vascular flow voids maintained. Mild cerebellar tonsillar ectopia without frank Chiari malformation. Craniocervical junction otherwise unremarkable. No focal marrow replacing lesion. Scalp soft tissues unremarkable. Globes and orbital soft tissues within normal limits. Paranasal sinuses are clear. Trace opacity right mastoid air cells, of doubtful significance. Inner ear structures normal. MRA HEAD FINDINGS ANTERIOR CIRCULATION: Distal cervical segments of the internal carotid arteries are widely patent with antegrade flow. Petrous, cavernous, and supraclinoid segments widely patent bilaterally without stenosis. Origin of the ophthalmic arteries patent. ICA termini widely patent. A1 segments, anterior communicating artery, and anterior cerebral arteries widely patent bilaterally. M1 segments widely patent without stenosis. Normal MCA bifurcations. Distal MCA branches well perfused and symmetric. POSTERIOR CIRCULATION: Vertebral arteries code dominant and widely patent to the vertebrobasilar junction. Right PICA  patent. Left PICA not visualized. Basilar widely patent to its distal aspect. Superior cerebral arteries patent bilaterally. Fetal type origin of the left PCA. Hypoplastic right P1 with prominent right posterior communicating artery. PCAs widely patent to their distal aspects. No aneurysm. MRA NECK FINDINGS Source images reviewed. Visualized aortic arch of normal caliber with normal 3 vessel morphology. No flow-limiting stenosis about the origin of the great vessels. Visualized subclavian arteries widely patent. Right common and internal carotid arteries widely patent without stenosis or occlusion. No atheromatous narrowing about the right carotid bifurcation. Left common and internal carotid arteries widely patent without stenosis or occlusion. No atheromatous narrowing about the left carotid bifurcation. Both of the vertebral arteries arise from the subclavian arteries. Vertebral arteries widely patent within the neck without stenosis or occlusion. IMPRESSION: 1. Normal brain MRI. No acute intracranial infarct or other abnormality identified. 2. Normal intracranial MRA. 3. Normal MRA of the neck. Electronically Signed   By: Rise Mu M.D.   On: 12/14/2017 00:49   Mr Brain Wo Contrast  Result Date: 12/14/2017 CLINICAL DATA:  Initial evaluation for acute speech difficulty. EXAM: MRI HEAD WITHOUT CONTRAST MRA HEAD WITHOUT CONTRAST MRA NECK WITHOUT AND WITH CONTRAST TECHNIQUE: Multiplanar, multiecho pulse sequences of the brain and surrounding structures were obtained without intravenous contrast. Angiographic images of the Circle of Willis were obtained using MRA technique without intravenous contrast. Angiographic images of the neck were obtained using MRA technique without and with intravenous contrast. Carotid stenosis measurements (when applicable) are obtained utilizing NASCET criteria, using the distal internal carotid diameter as the denominator. CONTRAST:  20mL MULTIHANCE GADOBENATE DIMEGLUMINE  529 MG/ML IV SOLN COMPARISON:  Prior head CT from earlier the same day. FINDINGS: MRI HEAD FINDINGS Cerebral volume within normal limits. No focal parenchymal signal abnormality. No abnormal foci of restricted diffusion to suggest acute or subacute ischemia. Gray-white matter differentiation maintained. No encephalomalacia to suggest chronic infarction. No acute or chronic intracranial hemorrhage. No mass lesion, midline shift or mass effect. No hydrocephalus. No  extra-axial fluid collection. Incidental note made of a partially empty sella. Major intracranial vascular flow voids maintained. Mild cerebellar tonsillar ectopia without frank Chiari malformation. Craniocervical junction otherwise unremarkable. No focal marrow replacing lesion. Scalp soft tissues unremarkable. Globes and orbital soft tissues within normal limits. Paranasal sinuses are clear. Trace opacity right mastoid air cells, of doubtful significance. Inner ear structures normal. MRA HEAD FINDINGS ANTERIOR CIRCULATION: Distal cervical segments of the internal carotid arteries are widely patent with antegrade flow. Petrous, cavernous, and supraclinoid segments widely patent bilaterally without stenosis. Origin of the ophthalmic arteries patent. ICA termini widely patent. A1 segments, anterior communicating artery, and anterior cerebral arteries widely patent bilaterally. M1 segments widely patent without stenosis. Normal MCA bifurcations. Distal MCA branches well perfused and symmetric. POSTERIOR CIRCULATION: Vertebral arteries code dominant and widely patent to the vertebrobasilar junction. Right PICA patent. Left PICA not visualized. Basilar widely patent to its distal aspect. Superior cerebral arteries patent bilaterally. Fetal type origin of the left PCA. Hypoplastic right P1 with prominent right posterior communicating artery. PCAs widely patent to their distal aspects. No aneurysm. MRA NECK FINDINGS Source images reviewed. Visualized aortic arch  of normal caliber with normal 3 vessel morphology. No flow-limiting stenosis about the origin of the great vessels. Visualized subclavian arteries widely patent. Right common and internal carotid arteries widely patent without stenosis or occlusion. No atheromatous narrowing about the right carotid bifurcation. Left common and internal carotid arteries widely patent without stenosis or occlusion. No atheromatous narrowing about the left carotid bifurcation. Both of the vertebral arteries arise from the subclavian arteries. Vertebral arteries widely patent within the neck without stenosis or occlusion. IMPRESSION: 1. Normal brain MRI. No acute intracranial infarct or other abnormality identified. 2. Normal intracranial MRA. 3. Normal MRA of the neck. Electronically Signed   By: Rise Mu M.D.   On: 12/14/2017 00:49    Assessment: 52 year old female with transient garbled speech occurring in conjunction with migraine headache 1. Left sided headache pain would somewhat militate in favor of cortical spreading depression occurring on the left; if this involved the left temporal lobe/perisylvian cortices/Broca's area, then an aphasia could result. She describes gradual resolution of the dysphasia, which also would be consistent with gradual resolution of the deficit from cortical spreading depression.  2. MRI shows no acute infarction. MRA of head and neck shows no predisposing stenoses. TIA is felt to be less likely than complicated migraine given the results of the imaging work up. However, given her age as well as the relatively low risks associated with ASA treatment, starting her on ASA 81 mg po qd should be considered.   Recommendations: 1. The patient declines phenergan for management of her headache.  2. From a neurological standpoint, the patient most likely can be discharged home after TTE is obtained, provided the study is normal. 3. Management of her fibromyalgia and nephrolithiasis pain,  as per primary team.    Electronically signed: Dr. Caryl Pina 12/14/2017, 5:12 AM

## 2017-12-14 NOTE — Progress Notes (Signed)
Spoke to patient before discharge. Agree with Dr Otelia LimesLindzen, less likely a stroke/TIA.  Agree with ASA 81mg  for primary stroke prevention.  F.U with patient's neurologist.

## 2017-12-14 NOTE — Progress Notes (Signed)
  Echocardiogram 2D Echocardiogram has been performed.  Brianna Roberts, Brianna Roberts 12/14/2017, 10:15 AM

## 2017-12-14 NOTE — ED Notes (Signed)
attemoted report unsuccessfully  rn not picking up her phone

## 2017-12-14 NOTE — ED Notes (Signed)
Report given to  Margot ChimesIrene rn

## 2017-12-16 ENCOUNTER — Emergency Department (HOSPITAL_COMMUNITY)
Admission: EM | Admit: 2017-12-16 | Discharge: 2017-12-16 | Disposition: A | Discharge status: Home / Self Care | Payer: Medicare HMO | Attending: Emergency Medicine | Admitting: Emergency Medicine

## 2017-12-16 ENCOUNTER — Emergency Department (HOSPITAL_COMMUNITY): Payer: Medicare HMO

## 2017-12-16 ENCOUNTER — Encounter (HOSPITAL_COMMUNITY): Payer: Self-pay | Admitting: Emergency Medicine

## 2017-12-16 ENCOUNTER — Other Ambulatory Visit: Payer: Self-pay

## 2017-12-16 DIAGNOSIS — I10 Essential (primary) hypertension: Secondary | ICD-10-CM | POA: Diagnosis not present

## 2017-12-16 DIAGNOSIS — R51 Headache: Secondary | ICD-10-CM | POA: Diagnosis not present

## 2017-12-16 DIAGNOSIS — G43109 Migraine with aura, not intractable, without status migrainosus: Secondary | ICD-10-CM

## 2017-12-16 DIAGNOSIS — B20 Human immunodeficiency virus [HIV] disease: Secondary | ICD-10-CM | POA: Diagnosis not present

## 2017-12-16 DIAGNOSIS — E039 Hypothyroidism, unspecified: Secondary | ICD-10-CM | POA: Insufficient documentation

## 2017-12-16 DIAGNOSIS — F17210 Nicotine dependence, cigarettes, uncomplicated: Secondary | ICD-10-CM | POA: Insufficient documentation

## 2017-12-16 DIAGNOSIS — Z79899 Other long term (current) drug therapy: Secondary | ICD-10-CM | POA: Diagnosis not present

## 2017-12-16 DIAGNOSIS — G43809 Other migraine, not intractable, without status migrainosus: Secondary | ICD-10-CM | POA: Diagnosis not present

## 2017-12-16 DIAGNOSIS — R69 Illness, unspecified: Secondary | ICD-10-CM | POA: Diagnosis not present

## 2017-12-16 HISTORY — DX: Transient cerebral ischemic attack, unspecified: G45.9

## 2017-12-16 LAB — I-STAT CHEM 8, ED
BUN: 3 mg/dL — ABNORMAL LOW (ref 6–20)
Calcium, Ion: 1.12 mmol/L — ABNORMAL LOW (ref 1.15–1.40)
Chloride: 106 mmol/L (ref 101–111)
Creatinine, Ser: 0.8 mg/dL (ref 0.44–1.00)
GLUCOSE: 158 mg/dL — AB (ref 65–99)
HCT: 39 % (ref 36.0–46.0)
HEMOGLOBIN: 13.3 g/dL (ref 12.0–15.0)
Potassium: 3 mmol/L — ABNORMAL LOW (ref 3.5–5.1)
Sodium: 143 mmol/L (ref 135–145)
TCO2: 20 mmol/L — AB (ref 22–32)

## 2017-12-16 LAB — CBG MONITORING, ED: Glucose-Capillary: 119 mg/dL — ABNORMAL HIGH (ref 65–99)

## 2017-12-16 MED ORDER — PROMETHAZINE HCL 25 MG/ML IJ SOLN: 25.0000 mg | Freq: Once | INTRAMUSCULAR | Status: DC

## 2017-12-16 MED ORDER — PROMETHAZINE HCL 25 MG/ML IJ SOLN
12.5000 mg | Freq: Once | INTRAMUSCULAR | Status: AC
Start: 2017-12-16 — End: 2017-12-16
  Administered 2017-12-16: 12.5 mg via INTRAVENOUS
  Filled 2017-12-16: qty 1

## 2017-12-16 MED ORDER — SODIUM CHLORIDE 0.9 % IV SOLN
Freq: Once | INTRAVENOUS | Status: AC
Start: 2017-12-16 — End: 2017-12-16
  Administered 2017-12-16: 02:00:00 via INTRAVENOUS

## 2017-12-16 MED ORDER — PROCHLORPERAZINE EDISYLATE 10 MG/2ML IJ SOLN
10.0000 mg | Freq: Once | INTRAMUSCULAR | Status: AC
  Administered 2017-12-16: 10 mg via INTRAVENOUS
  Filled 2017-12-16: qty 2

## 2017-12-16 MED ORDER — KETOROLAC TROMETHAMINE 30 MG/ML IJ SOLN
30.0000 mg | Freq: Once | INTRAMUSCULAR | Status: AC
  Administered 2017-12-16: 30 mg via INTRAVENOUS
  Filled 2017-12-16: qty 1

## 2017-12-16 NOTE — Consult Note (Signed)
NEURO HOSPITALIST CONSULT NOTE   Requesting physician: ED physician  Reason for Consult: Migraine with aura  History obtained from:  Patient     HPI:                                                                                                                                          Brianna Roberts is an 52 y.o. female last seen in consultation by me on 6/22 during her recent admission for complicated migraine.   HPI from her recent consult note is reviewed: "Brianna Roberts is an 52 y.o. female with a history of migraine headaches, who presents with migraine headache occurring in conjunction with garbled speech. She has never had a neurological deficit with her headaches before. Symptoms started at 3:45 when she was on the telephone with [employer] and the rep could not understand what she was saying. She then called a friend who noted the same thing. Subsequently a third friend was called, who asked "are you okay, I can't understand what you are saying". The patient states the above occurred over a 15 minute interval, towards the end of which she developed left ocular migraine pain, her typical pattern for pain, which was 8/10. She then sought evaluation at the ED. Her speech has returned to normal since admission, but headache has waxed and waned and is now about 6/10 in intensity. She also complains of fibromyalgia pain and pain from a kidney stone. She had no other neurological deficits with this episode."  Today, she presents with 7/10 left sided migrainous headache pain in conjunction with graying of vision within the temporal hemifield of her left eye. Also with a spot-like pattern of visual obscuration seen in her left temporal visual fields, but not with her right eye.   Past Medical History:  Diagnosis Date  . Allergy   . Anxiety   . Arthritis   . Asthma   . 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 Concussion   . DDD (degenerative disc  disease), cervical   . Depression   . Fibromyalgia   . HIV (human immunodeficiency virus infection) (HCC)   . Homelessness   . Hypertension   . Hypothyroidism   . Insomnia   . Neuromuscular disorder (HCC)   . Thyroid disease   . TIA (transient ischemic attack)     Past Surgical History:  Procedure Laterality Date  . ABDOMINAL HYSTERECTOMY     Partial-ovaries and tubes remain  . CHOLECYSTECTOMY  2006  . COLONOSCOPY  07/27/2015   [place]  . HERNIA REPAIR  1997  . TUMOR EXCISION Right 1984   Sinuses  . TUMOR EXCISION Left 2015   sinuses  . URACHAL CYST EXCISION  1997    Family History  Problem  Relation Age of Onset  . Diabetes Mother   . Heart disease Mother   . Hyperlipidemia Mother   . Hypertension Mother   . Stroke Mother   . Mental illness Mother   . Heart failure Mother   . Headache Mother        Joellyn Quails Cyst  . Breast cancer Mother 103  . Heart disease Father   . Alcohol abuse Father   . Hypertension Sister   . Heart disease Brother   . Mental illness Brother   . COPD Brother   . Heart failure Brother   . Hypertension Sister   . Colon cancer Neg Hx               Social History:  reports that she has been smoking cigarettes.  She has a 9.90 pack-year smoking history. She has never used smokeless tobacco. She reports that she does not drink alcohol or use drugs.  Allergies  Allergen Reactions  . Iodinated Diagnostic Agents Anaphylaxis and Other (See Comments)    Patient complained of throat/ uvula swelling s/p injection on 02/01/2017 but states she isnt allergic to the dye and has had several times prior   . Sulfa Antibiotics Anaphylaxis  . Codeine Other (See Comments)    Unknown - patient said mother told her she was allergic as a child  . Dilaudid [Hydromorphone Hcl] Itching  . Penicillins Other (See Comments)    Told she was allergic from childhood: Has patient had a PCN reaction causing immediate rash, facial/tongue/throat swelling, SOB or  lightheadedness with hypotension: Unk Has patient had a PCN reaction causing severe rash involving mucus membranes or skin necrosis: Unk Has patient had a PCN reaction that required hospitalization: Unk Has patient had a PCN reaction occurring within the last 10 years: No If all of the above answers are "NO", then may proceed with Cephalosporin use.  Marland Kitchen Spironolactone Other (See Comments)    "GI Issues"  . Adhesive [Tape] Rash  . Nickel Rash    HOME MEDICATIONS:                                                                                                                        ROS:  As per HPI.    Blood pressure (!) 132/95, pulse (!) 113, temperature 98.5 F (36.9 C), temperature source Oral, resp. rate 16, SpO2 98 %.   General Examination:                                                                                                       Physical Exam  General: Morbidly obese. In hospital wheelchair. NAD.  HEENT-  [place]/AT  Lungs- Respirations unlabored Extremities- Warm and well perfused Skin- Scaling along dorsum of hands again noted bilaterally  Neurological Examination Mental Status: Alert. Not agitated or confused.  Speech fluent without evidence for aphasia. No dysarthria. Able to follow all commands without difficulty. Cranial Nerves: II: Visual fields tested all quadrants of each eye: Normal OD. Mild constriction of temporal visual fields superiorly and inferiorly OS.  III,IV, VI: Ptosis not present, EOMI without nystagmus VII: Smile symmetric VIII: hearing intact to voice IX,X: No hypophonia XI: Symmetric  XII: No lingual dysarthria Motor: Right :  Upper extremity   5/5                                      Left:     Upper extremity   5/5             Lower extremity   5/5                                                  Lower  extremity   5/5 Normal tone throughout; no atrophy noted No pronator or parietal drift  Sensory:  Temp sensation subjectively normal bilateral upper ext Cerebellar: No ataxia with FNF bilaterally Gait: Deferred     Lab Results: Basic Metabolic Panel: Recent Labs  Lab 12/13/17 1857 12/13/17 1913  NA 141 142  K 3.5 3.4*  CL 108 105  CO2 25  --   GLUCOSE 98 93  BUN 7 7  CREATININE 0.92 0.80  CALCIUM 9.1  --     CBC: Recent Labs  Lab 12/13/17 1857 12/13/17 1913  WBC 10.5  --   NEUTROABS 4.4  --   HGB 12.4 13.3  HCT 39.4 39.0  MCV 84.9  --   PLT 311  --     Cardiac Enzymes: No results for input(s): CKTOTAL, CKMB, CKMBINDEX, TROPONINI in the last 168 hours.  Lipid Panel: Recent Labs  Lab 12/14/17 0516  CHOL 128  TRIG 196*  HDL 35*  CHOLHDL 3.7  VLDL 39  LDLCALC 54    Imaging: No results found.  Assessment: Migraine with aura 1. [facility] migraine manifesting with dysphasia 2. Today's presentation somewhat unusual as no visual field deficit on right eye, despite crescentic temporal field constriction left eye.  3. Current headache rated by patient as 7/10  Recommendations: 1. Phenergan  25 mg IV x 1 now.  2. Call neurology if there is no improvement with Phenergan.    Electronically signed: Dr. Caryl Pina 12/16/2017, 12:29 AM

## 2017-12-16 NOTE — ED Triage Notes (Signed)
Discharged 2 days ago for TIA.  Pt reports sudden onset of pain behind left eye at 11:30pm.  States pain quickly progressed to L side of head.  Also reports dizziness and loss of vision in L eye that started shortly after symptom onset.  No neuro deficits noted on triage exam.  Dr. Otelia LimesLindzen in dept and came to triage to assess pt.  No code stroke at this time.

## 2017-12-16 NOTE — ED Provider Notes (Signed)
[facility] EMERGENCY DEPARTMENT Provider Note   CSN: [id number] Arrival date & time: 12/16/17  0007     History   Chief Complaint Chief Complaint  Patient presents with  . Headache    HPI Brianna Roberts is a 52 y.o. female.  HPI Patient is a 52 year old female with a history of anxiety, fibromyalgia, hypertension, hypothyroidism who presents the emergency department with worsening left-sided and left periorbital headache without significant changes in her vision.  She was discharged from the hospital 2 days ago after a large TIA work-up and after work-up in the hospital at that time was felt to be more presentation of complicated migraine.  No fevers or chills.  No neck pain.  No weakness of her arms or legs.  No difficulty with her speech.  Symptoms are moderate to severe in severity at this time.  She is tried her home medications including oxycodone without improvement of her symptoms.   Past Medical History:  Diagnosis Date  . Allergy   . Anxiety   . Arthritis   . Asthma   . 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 Concussion   . DDD (degenerative disc disease), cervical   . Depression   . Fibromyalgia   . HIV (human immunodeficiency virus infection) (HCC)   . Homelessness   . Hypertension   . Hypothyroidism   . Insomnia   . Neuromuscular disorder (HCC)   . Thyroid disease   . TIA (transient ischemic attack)     Patient Active Problem List   Diagnosis Date Noted  . Dysarthria 12/13/2017  . Headache 12/13/2017  . Chronic pain syndrome 03/17/2017  . Incomplete emptying of bladder 02/22/2017  . Prediabetes 12/31/2016  . Post concussion syndrome 10/06/2016  . 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 Fibromyalgia 10/05/2016  . Benign essential HTN 10/05/2016  . Hypothyroidism 10/05/2016  . Allergic rhinitis 10/05/2016  . Chronic fatigue syndrome 10/05/2016  . Asthma, chronic 10/05/2016  .  HSV (herpes simplex virus) anogenital infection 10/05/2016  . Anxiety and depression 10/05/2016  . Bilateral leg edema 10/05/2016  . Ovarian cyst 10/05/2016  . Nephrolithiasis 10/05/2016  . IBS (irritable bowel syndrome) 10/05/2016  . OSA (obstructive sleep apnea) 10/05/2016  . Left-sided weakness 10/05/2016  . Migraine 10/05/2016  . BMI 45.0-49.9, adult (HCC) 10/05/2016  . DDD (degenerative disc disease), cervical   . Insomnia     Past Surgical History:  Procedure Laterality Date  . ABDOMINAL HYSTERECTOMY     Partial-ovaries and tubes remain  . CHOLECYSTECTOMY  2006  . COLONOSCOPY  07/27/2015   [place]  . HERNIA REPAIR  1997  . TUMOR EXCISION Right 1984   Sinuses  . TUMOR EXCISION Left 2015   sinuses  . URACHAL CYST EXCISION  1997     OB History   None      Home Medications    Prior to Admission medications   Medication Sig Start Date End Date Taking? Authorizing Provider  acetaminophen (TYLENOL) 325 MG tablet Take 650 mg by mouth every 6 (six) hours as needed (for pain).    [provider]  acyclovir (ZOVIRAX) 200 MG capsule Take 1 capsule (200 mg total) by mouth 2 (two) times daily. For Outbreak: 2 capsules (400 mg) TID x 5 days. Patient taking differently: Take 400 mg by mouth 2 (two) times daily.  11/01/17   Porfirio Oar, PA-C  albuterol (PROVENTIL HFA;VENTOLIN HFA) 108 (90 Base) MCG/ACT inhaler  Inhale 1-2 puffs into the lungs every 6 (six) hours as needed for wheezing or shortness of breath. 08/09/17   Porfirio OarJeffery, Chelle, PA-C  amitriptyline (ELAVIL) 75 MG tablet Take 1 tablet (75 mg total) by mouth at bedtime. 08/09/17   Porfirio OarJeffery, Chelle, PA-C  amLODipine (NORVASC) 10 MG tablet Take 1 tablet (10 mg total) by mouth daily. 08/21/17   Porfirio OarJeffery, Chelle, PA-C  ARIPiprazole (ABILIFY) 10 MG tablet Take 1 tablet (10 mg total) by mouth daily. 10/05/16   Porfirio OarJeffery, Chelle, PA-C  ASPERCREME LIDOCAINE EX Apply 1 application topically 2 (two) times daily as needed (for  muscle pain).    [provider]  azelastine (ASTELIN) 0.1 % nasal spray USE 2 SPRAYS EACH NOSTRIL TWICE DAILY. 10/15/17   Porfirio OarJeffery, Chelle, PA-C  budesonide-formoterol (SYMBICORT) 160-4.5 MCG/ACT inhaler Inhale 2 puffs into the lungs 2 (two) times daily. 12/27/16   Jeffery, Chelle, PA-C  diazepam (VALIUM) 5 MG tablet Take 1 tablet (5 mg total) by mouth every 8 (eight) hours as needed for anxiety. 10/14/17   Porfirio OarJeffery, Chelle, PA-C  diphenhydrAMINE (BENADRYL) 25 mg capsule Take 25 mg by mouth 2 (two) times daily.    [provider]  fluticasone (FLONASE) 50 MCG/ACT nasal spray USE 1 SPRAY IN EACH NOSTRIL DAILY. 04/02/17   Porfirio OarJeffery, Chelle, PA-C  furosemide (LASIX) 40 MG tablet Take 1 tablet (40 mg total) by mouth 2 (two) times daily. Patient taking differently: Take 80 mg by mouth at bedtime.  08/09/17   Porfirio OarJeffery, Chelle, PA-C  glucose blood test strip Use as instructed 04/17/17   Porfirio OarJeffery, Chelle, PA-C  hydrOXYzine (ATARAX/VISTARIL) 10 MG tablet Take 1 tablet (10 mg total) by mouth at bedtime. Patient taking differently: Take 10 mg by mouth at bedtime as needed (itching or angioedema).  10/05/16   Porfirio OarJeffery, Chelle, PA-C  Incontinence Supply Disposable (PREVAIL WASHCLOTHS) MISC Use daily as directed 06/03/17   Porfirio OarJeffery, Chelle, PA-C  ipratropium-albuterol (DUONEB) 0.5-2.5 (3) MG/3ML SOLN Take 3 mLs by nebulization every 6 (six) hours as needed (for breathing). Patient taking differently: Take 3 mLs by nebulization every 6 (six) hours as needed (for shortness of breath or wheezing).  10/05/16   Porfirio OarJeffery, Chelle, PA-C  Lancets Thin MISC Use to check home glucose daily 03/28/17   Porfirio OarJeffery, Chelle, PA-C  levothyroxine (SYNTHROID, LEVOTHROID) 50 MCG tablet Take 1 tablet (50 mcg total) by mouth daily before breakfast. Patient taking differently: Take 50 mcg by mouth daily.  08/17/17   Porfirio OarJeffery, Chelle, PA-C  losartan (COZAAR) 100 MG tablet Take 1 tablet (100 mg total) by mouth daily. 08/09/17   Porfirio OarJeffery,  Chelle, PA-C  meloxicam (MOBIC) 15 MG tablet TAKE 1 TABLET EACH DAY. 11/08/17   Jeffery, Chelle, PA-C  methocarbamol (ROBAXIN) 500 MG tablet Take 2 tablets (1,000 mg total) by mouth every 8 (eight) hours as needed for muscle spasms. 02/01/17   Loren RacerYelverton, David, MD  metoprolol tartrate (LOPRESSOR) 100 MG tablet Take 1 tablet (100 mg total) by mouth 2 (two) times daily. 08/09/17   Porfirio OarJeffery, Chelle, PA-C  Nerve Stimulator (PRO COMFORT TENS UNIT) DEVI 1 Device by Does not apply route daily as needed. 12/27/16   Porfirio OarJeffery, Chelle, PA-C  nystatin (MYCOSTATIN/NYSTOP) powder Apply topically 2 (two) times daily. Patient taking differently: Apply 8 g topically 2 (two) times daily.  12/27/16 12/27/17  Porfirio OarJeffery, Chelle, PA-C  nystatin cream (MYCOSTATIN) Apply 1 application topically 2 (two) times daily.    [provider]  oxyCODONE (OXY IR/ROXICODONE) 5 MG immediate release tablet Take 1 tablet (5  mg total) by mouth every 4 (four) hours as needed for severe pain. 10/15/17   Porfirio Oar, PA-C  potassium chloride SA (K-DUR,KLOR-CON) 20 MEQ tablet Take 1 tablet (20 mEq total) by mouth 2 (two) times daily. 06/24/17   Porfirio Oar, PA-C  traMADol (ULTRAM) 50 MG tablet Take 1 tablet (50 mg total) by mouth 3 (three) times daily as needed. Patient taking differently: Take 50 mg by mouth at bedtime.  10/14/17   Porfirio Oar, PA-C  triamcinolone ointment (KENALOG) 0.5 % Apply 1 application topically 2 (two) times daily.    [provider]  vitamin B-12 (CYANOCOBALAMIN) 100 MCG tablet Take 100 mcg by mouth daily.    [provider]    Family History Family History  Problem Relation Age of Onset  . Diabetes Mother   . Heart disease Mother   . Hyperlipidemia Mother   . Hypertension Mother   . Stroke Mother   . Mental illness Mother   . Heart failure Mother   . Headache Mother        Joellyn Quails Cyst  . Breast cancer Mother 75  . Heart disease Father   . Alcohol abuse Father   .  Hypertension Sister   . Heart disease Brother   . Mental illness Brother   . COPD Brother   . Heart failure Brother   . Hypertension Sister   . Colon cancer Neg Hx     Social History Social History   Tobacco Use  . Smoking status: Current Every Day Smoker    Packs/day: 0.30    Years: 33.00    Pack years: 9.90    Types: Cigarettes  . Smokeless tobacco: Never Used  Substance Use Topics  . Alcohol use: No  . Drug use: No     Allergies   Iodinated diagnostic agents; Sulfa antibiotics; Codeine; Dilaudid [hydromorphone hcl]; Penicillins; Spironolactone; Adhesive [tape]; and Nickel   Review of Systems Review of Systems  All other systems reviewed and are negative.    Physical Exam Updated Vital Signs BP (!) 132/95 (BP Location: Right Arm)   Pulse (!) 113   Temp 98.5 F (36.9 C) (Oral)   Resp 16   SpO2 98%   Physical Exam  Constitutional: She is oriented to person, place, and time. She appears well-developed and well-nourished. No distress.  HENT:  Head: Normocephalic and atraumatic.  Eyes: Pupils are equal, round, and reactive to light. EOM are normal.  Neck: Normal range of motion.  Cardiovascular: Normal rate, regular rhythm and normal heart sounds.  Pulmonary/Chest: Effort normal and breath sounds normal.  Abdominal: Soft. She exhibits no distension. There is no tenderness.  Musculoskeletal: Normal range of motion.  Neurological: She is alert and oriented to person, place, and time.  5/5 strength in major muscle groups of  bilateral upper and lower extremities. Speech normal. No facial asymetry.   Skin: Skin is warm and dry.  Psychiatric: She has a normal mood and affect. Judgment normal.  Nursing note and vitals reviewed.    ED Treatments / Results  Labs (all labs ordered are listed, but only abnormal results are displayed) Labs Reviewed  CBG MONITORING, ED - Abnormal; Notable for the following components:      Result Value   Glucose-Capillary 119 (*)     All other components within normal limits  I-STAT CHEM 8, ED - Abnormal; Notable for the following components:   Potassium 3.0 (*)    BUN 3 (*)    Glucose, Bld  158 (*)    Calcium, Ion 1.12 (*)    TCO2 20 (*)    All other components within normal limits    EKG EKG Interpretation  Date/Time:  Monday December 16 2017 00:15:54 EDT Ventricular Rate:  107 PR Interval:  132 QRS Duration: 94 QT Interval:  336 QTC Calculation: 448 R Axis:   53 Text Interpretation:  Sinus tachycardia T wave abnormality, consider inferolateral ischemia Abnormal ECG No significant change since last tracing Confirmed by Azalia Bilis ([id number]) on 12/16/2017 1:47:14 AM   Radiology Ct Head Wo Contrast  Result Date: 12/16/2017 CLINICAL DATA:  52 year old female with headache. EXAM: CT HEAD WITHOUT CONTRAST TECHNIQUE: Contiguous axial images were obtained from the base of the skull through the vertex without intravenous contrast. COMPARISON:  Brain MRI dated 12/13/2017 FINDINGS: Brain: No evidence of acute infarction, hemorrhage, hydrocephalus, extra-axial collection or mass lesion/mass effect. Vascular: No hyperdense vessel or unexpected calcification. Skull: Normal. Negative for fracture or focal lesion. Sinuses/Orbits: No acute finding. Other: None IMPRESSION: Normal noncontrast CT of the brain. Electronically Signed   By: Elgie Collard M.D.   On: 12/16/2017 01:43    Procedures Procedures (including critical care time)  Medications Ordered in ED Medications  0.9 %  sodium chloride infusion ( Intravenous New Bag/Given 12/16/17 0156)  prochlorperazine (COMPAZINE) injection 10 mg (10 mg Intravenous Given 12/16/17 0155)  ketorolac (TORADOL) 30 MG/ML injection 30 mg (30 mg Intravenous Given 12/16/17 0155)  promethazine (PHENERGAN) injection 12.5 mg (12.5 mg Intravenous Given 12/16/17 0155)     Initial Impression / Assessment and Plan / ED Course  I have reviewed the triage vital signs and the nursing  notes.  Pertinent labs & imaging results that were available during my care of the patient were reviewed by me and considered in my medical decision making (see chart for details).     Evaluation in the emergency department is somewhat reassuring.  Normal neurologic exam.  No facial asymmetry noted.  Repeat head CT without acute pathology.  Patient given medication for migraine headache.  She reports no significant improvement in her pain but would still prefer to go home at this time and not undergo additional treatment.  She will try her home medications.  She has not seen a neurologist.  I have recommended that she follow-up with neurology as an outpatient.  Her MRIs from her recent hospitalization were reviewed and were without acute pathology.  Atypical migraine/[facility] migraine picture.  Patient is encouraged to return to the emergency department for new or worsening symptoms  Final Clinical Impressions(s) / ED Diagnoses   Final diagnoses:  Other migraine without status migrainosus, not intractable    ED Discharge Orders    None       Azalia Bilis, MD 12/16/17 [phone]

## 2017-12-17 DIAGNOSIS — M797 Fibromyalgia: Secondary | ICD-10-CM | POA: Diagnosis not present

## 2017-12-17 DIAGNOSIS — M5030 Other cervical disc degeneration, unspecified cervical region: Secondary | ICD-10-CM | POA: Diagnosis not present

## 2017-12-17 DIAGNOSIS — M1611 Unilateral primary osteoarthritis, right hip: Secondary | ICD-10-CM | POA: Diagnosis not present

## 2017-12-17 DIAGNOSIS — R269 Unspecified abnormalities of gait and mobility: Secondary | ICD-10-CM | POA: Diagnosis not present

## 2017-12-17 DIAGNOSIS — Z79899 Other long term (current) drug therapy: Secondary | ICD-10-CM | POA: Diagnosis not present

## 2017-12-17 DIAGNOSIS — R471 Dysarthria and anarthria: Secondary | ICD-10-CM | POA: Diagnosis not present

## 2017-12-17 DIAGNOSIS — I10 Essential (primary) hypertension: Secondary | ICD-10-CM | POA: Diagnosis not present

## 2017-12-17 DIAGNOSIS — M170 Bilateral primary osteoarthritis of knee: Secondary | ICD-10-CM | POA: Diagnosis not present

## 2017-12-18 ENCOUNTER — Encounter: Payer: Self-pay | Admitting: Neurology

## 2017-12-18 ENCOUNTER — Telehealth: Payer: Self-pay | Admitting: Physician Assistant

## 2017-12-18 DIAGNOSIS — R471 Dysarthria and anarthria: Secondary | ICD-10-CM | POA: Diagnosis not present

## 2017-12-18 DIAGNOSIS — Z79899 Other long term (current) drug therapy: Secondary | ICD-10-CM | POA: Diagnosis not present

## 2017-12-18 DIAGNOSIS — R269 Unspecified abnormalities of gait and mobility: Secondary | ICD-10-CM | POA: Diagnosis not present

## 2017-12-18 NOTE — Telephone Encounter (Signed)
Please advise. Dgaddy, CMA 

## 2017-12-18 NOTE — Telephone Encounter (Signed)
Patient referred to home health after obs for TIA vs complex migraine. Approve verbal orders,  please call [facility]. Patient needs to be seen for hospital followup in the next week or so. thanks

## 2017-12-18 NOTE — Telephone Encounter (Unsigned)
Copied from CRM [id number]. Topic: General - Other >> Dec 18, 2017 12:21 PM Percival SpanishKennedy, Cheryl W wrote:  [facility] for [facility]  1 x 5  Also req   Speech Therapy eval   [phone]

## 2017-12-19 ENCOUNTER — Telehealth: Payer: Self-pay | Admitting: Physician Assistant

## 2017-12-19 ENCOUNTER — Ambulatory Visit (INDEPENDENT_AMBULATORY_CARE_PROVIDER_SITE_OTHER): Payer: Medicare HMO | Admitting: Family Medicine

## 2017-12-19 ENCOUNTER — Encounter: Payer: Self-pay | Admitting: Family Medicine

## 2017-12-19 ENCOUNTER — Other Ambulatory Visit: Payer: Self-pay

## 2017-12-19 VITALS — BP 118/74 | HR 91 | Temp 98.7°F | Ht 69.0 in | Wt 340.8 lb

## 2017-12-19 DIAGNOSIS — M436 Torticollis: Secondary | ICD-10-CM

## 2017-12-19 DIAGNOSIS — M797 Fibromyalgia: Secondary | ICD-10-CM

## 2017-12-19 DIAGNOSIS — R5383 Other fatigue: Secondary | ICD-10-CM | POA: Diagnosis not present

## 2017-12-19 DIAGNOSIS — R479 Unspecified speech disturbances: Secondary | ICD-10-CM | POA: Diagnosis not present

## 2017-12-19 DIAGNOSIS — E876 Hypokalemia: Secondary | ICD-10-CM

## 2017-12-19 DIAGNOSIS — R51 Headache: Secondary | ICD-10-CM

## 2017-12-19 DIAGNOSIS — R519 Headache, unspecified: Secondary | ICD-10-CM

## 2017-12-19 LAB — POCT CBC
Granulocyte percent: 47 %G (ref 37–80)
HCT, POC: 38.7 % (ref 37.7–47.9)
Hemoglobin: 12.4 g/dL (ref 12.2–16.2)
Lymph, poc: 4.3 — AB (ref 0.6–3.4)
MCH, POC: 26.6 pg — AB (ref 27–31.2)
MCHC: 32 g/dL (ref 31.8–35.4)
MCV: 83.2 fL (ref 80–97)
MID (CBC): 0.4 (ref 0–0.9)
MPV: 6.9 fL (ref 0–99.8)
PLATELET COUNT, POC: 359 10*3/uL (ref 142–424)
POC Granulocyte: 4.2 (ref 2–6.9)
POC LYMPH %: 48.6 % (ref 10–50)
POC MID %: 4.4 %M (ref 0–12)
RBC: 4.65 M/uL (ref 4.04–5.48)
RDW, POC: 13.9 %
WBC: 8.9 10*3/uL (ref 4.6–10.2)

## 2017-12-19 LAB — GLUCOSE, POCT (MANUAL RESULT ENTRY): POC GLUCOSE: 94 mg/dL (ref 70–99)

## 2017-12-19 NOTE — Progress Notes (Signed)
Subjective:  By signing my name below, I, Brianna Roberts, attest that this documentation has been prepared under the direction and in the presence of [name], MD Electronically Signed: Charline Bills, ED Scribe 12/19/2017 at 12:18 PM.   Patient ID: Brianna Roberts, female    DOB: 1966/04/25, 52 y.o.   MRN: [id number]  Chief Complaint  Patient presents with  . Hospitalization Follow-up    unsure complex migraine or TIA.   Marland Kitchen Speech Problem    Speech is going in and out. more horsness starting yesterday    HPI Brianna Roberts is a 52 y.o. female who presents to Primary Care at [facility] for hospital f/u and concern for her speech. H/o multiple med probs per prob list. Admitted 6/21-22 after presenting with HA and difficulty with garbled speech. Review of hospital notes, speech difficulty started then L occular migraine pain, typical migraine pattern. Speech returned to normal in hospital, HA waxed and waned. No other neuro deficits. Unremarkable MRI carotid and echo imaging, thought to be complicated margarine. Started 81 mg asa daily as potentially TIA vs complicated migraine. Normal brain MRI/MRA and neck MRA 6/21. Glucose 119 and K of 3.0 3 days ago. Also had rpt head CT 3 days ago without acute pathology. - Pt states that she has not started asa yet. Home OT/PT came by yesterday to do an assessment who noticed garbled speech that lasted for ~40 mins. She noticed R-sided HA afterward speech difficulty and pressure behind both eyes, which has improved some today. She does report increased pain with moving her eyes, photophobia, phonophobia, L-sided neck pain and neck stiffness. Pt reports that HAs are more severe than her typical migraines. Also states that she is extremely fatigued but she is also in a fibromyalgia flare. Denies recent fever (last fever was ~4 days prior to hospital visit), chills, nasal discharge, heart palpitations. She does report a h/o occular migraines which she treats with  oxycodone prn, haven't taken any recently. States she stopped daily oxycodone due to rebound migraines. She has an upcoming appointment with neuro on Sept 30. Pt lives alone, all of her family lives in [place], [place].  Patient Active Problem List   Diagnosis Date Noted  . Dysarthria 12/13/2017  . Headache 12/13/2017  . Chronic pain syndrome 03/17/2017  . Incomplete emptying of bladder 02/22/2017  . Prediabetes 12/31/2016  . Post concussion syndrome 10/06/2016  . 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 Fibromyalgia 10/05/2016  . Benign essential HTN 10/05/2016  . Hypothyroidism 10/05/2016  . Allergic rhinitis 10/05/2016  . Chronic fatigue syndrome 10/05/2016  . Asthma, chronic 10/05/2016  . HSV (herpes simplex virus) anogenital infection 10/05/2016  . Anxiety and depression 10/05/2016  . Bilateral leg edema 10/05/2016  . Ovarian cyst 10/05/2016  . Nephrolithiasis 10/05/2016  . IBS (irritable bowel syndrome) 10/05/2016  . OSA (obstructive sleep apnea) 10/05/2016  . Left-sided weakness 10/05/2016  . Migraine 10/05/2016  . BMI 45.0-49.9, adult (HCC) 10/05/2016  . DDD (degenerative disc disease), cervical   . Insomnia    Past Medical History:  Diagnosis Date  . Allergy   . Anxiety   . Arthritis   . Asthma   . 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 Concussion   . DDD (degenerative disc disease), cervical   . Depression   . Fibromyalgia   . HIV (human immunodeficiency virus infection) (HCC)   . Homelessness   . Hypertension   . Hypothyroidism   .  Insomnia   . Neuromuscular disorder (HCC)   . Thyroid disease   . TIA (transient ischemic attack)    Past Surgical History:  Procedure Laterality Date  . ABDOMINAL HYSTERECTOMY     Partial-ovaries and tubes remain  . CHOLECYSTECTOMY  2006  . COLONOSCOPY  07/27/2015   [place] MD  . HERNIA REPAIR  1997  . TUMOR EXCISION Right 1984   Sinuses  . TUMOR EXCISION Left 2015    sinuses  . URACHAL CYST EXCISION  1997   Allergies  Allergen Reactions  . Iodinated Diagnostic Agents Anaphylaxis and Other (See Comments)    Patient complained of throat/ uvula swelling s/p injection on 02/01/2017 but states she isnt allergic to the dye and has had several times prior   . Sulfa Antibiotics Anaphylaxis  . Codeine Other (See Comments)    Unknown - patient said mother told her she was allergic as a child  . Dilaudid [Hydromorphone Hcl] Itching  . Penicillins Other (See Comments)    Told she was allergic from childhood: Has patient had a PCN reaction causing immediate rash, facial/tongue/throat swelling, SOB or lightheadedness with hypotension: Unk Has patient had a PCN reaction causing severe rash involving mucus membranes or skin necrosis: Unk Has patient had a PCN reaction that required hospitalization: Unk Has patient had a PCN reaction occurring within the last 10 years: No If all of the above answers are "NO", then may proceed with Cephalosporin use.  Marland Kitchen Spironolactone Other (See Comments)    "GI Issues"  . Adhesive [Tape] Rash  . Nickel Rash   Prior to Admission medications   Medication Sig Start Date End Date Taking? Authorizing Provider  acetaminophen (TYLENOL) 325 MG tablet Take 650 mg by mouth every 6 (six) hours as needed (for pain).   Yes [provider]  acyclovir (ZOVIRAX) 200 MG capsule Take 1 capsule (200 mg total) by mouth 2 (two) times daily. For Outbreak: 2 capsules (400 mg) TID x 5 days. Patient taking differently: Take 400 mg by mouth 2 (two) times daily.  11/01/17  Yes Jeffery, Chelle, PA-C  albuterol (PROVENTIL HFA;VENTOLIN HFA) 108 (90 Base) MCG/ACT inhaler Inhale 1-2 puffs into the lungs every 6 (six) hours as needed for wheezing or shortness of breath. 08/09/17  Yes Jeffery, Chelle, PA-C  amitriptyline (ELAVIL) 75 MG tablet Take 1 tablet (75 mg total) by mouth at bedtime. 08/09/17  Yes Jeffery, Chelle, PA-C  amLODipine (NORVASC) 10 MG  tablet Take 1 tablet (10 mg total) by mouth daily. 08/21/17  Yes Jeffery, Chelle, PA-C  ARIPiprazole (ABILIFY) 10 MG tablet Take 1 tablet (10 mg total) by mouth daily. 10/05/16  Yes Jeffery, Chelle, PA-C  ASPERCREME LIDOCAINE EX Apply 1 application topically 2 (two) times daily as needed (for muscle pain).   Yes [provider]  azelastine (ASTELIN) 0.1 % nasal spray USE 2 SPRAYS EACH NOSTRIL TWICE DAILY. 10/15/17  Yes Jeffery, Chelle, PA-C  budesonide-formoterol (SYMBICORT) 160-4.5 MCG/ACT inhaler Inhale 2 puffs into the lungs 2 (two) times daily. 12/27/16  Yes Jeffery, Chelle, PA-C  diazepam (VALIUM) 5 MG tablet Take 1 tablet (5 mg total) by mouth every 8 (eight) hours as needed for anxiety. 10/14/17  Yes Jeffery, Chelle, PA-C  diphenhydrAMINE (BENADRYL) 25 mg capsule Take 25 mg by mouth 2 (two) times daily.   Yes [provider]  fluticasone (FLONASE) 50 MCG/ACT nasal spray USE 1 SPRAY IN EACH NOSTRIL DAILY. 04/02/17  Yes Jeffery, Chelle, PA-C  furosemide (LASIX) 40 MG tablet Take 1 tablet (  40 mg total) by mouth 2 (two) times daily. Patient taking differently: Take 80 mg by mouth at bedtime.  08/09/17  Yes Jeffery, Chelle, PA-C  glucose blood test strip Use as instructed 04/17/17  Yes Jeffery, Chelle, PA-C  hydrOXYzine (ATARAX/VISTARIL) 10 MG tablet Take 1 tablet (10 mg total) by mouth at bedtime. Patient taking differently: Take 10 mg by mouth at bedtime as needed (itching or angioedema).  10/05/16  Yes Porfirio Oar, PA-C  Incontinence Supply Disposable (PREVAIL WASHCLOTHS) MISC Use daily as directed 06/03/17  Yes Jeffery, Chelle, PA-C  ipratropium-albuterol (DUONEB) 0.5-2.5 (3) MG/3ML SOLN Take 3 mLs by nebulization every 6 (six) hours as needed (for breathing). Patient taking differently: Take 3 mLs by nebulization every 6 (six) hours as needed (for shortness of breath or wheezing).  10/05/16  Yes Porfirio Oar, PA-C  Lancets Thin MISC Use to check home glucose daily 03/28/17  Yes  Jeffery, Chelle, PA-C  levothyroxine (SYNTHROID, LEVOTHROID) 50 MCG tablet Take 1 tablet (50 mcg total) by mouth daily before breakfast. Patient taking differently: Take 50 mcg by mouth daily.  08/17/17  Yes Jeffery, Chelle, PA-C  losartan (COZAAR) 100 MG tablet Take 1 tablet (100 mg total) by mouth daily. 08/09/17  Yes Jeffery, Chelle, PA-C  meloxicam (MOBIC) 15 MG tablet TAKE 1 TABLET EACH DAY. 11/08/17  Yes Jeffery, Chelle, PA-C  methocarbamol (ROBAXIN) 500 MG tablet Take 2 tablets (1,000 mg total) by mouth every 8 (eight) hours as needed for muscle spasms. 02/01/17  Yes Loren Racer, MD  metoprolol tartrate (LOPRESSOR) 100 MG tablet Take 1 tablet (100 mg total) by mouth 2 (two) times daily. 08/09/17  Yes Porfirio Oar, PA-C  Nerve Stimulator (PRO COMFORT TENS UNIT) DEVI 1 Device by Does not apply route daily as needed. 12/27/16  Yes Jeffery, Chelle, PA-C  nystatin (MYCOSTATIN/NYSTOP) powder Apply topically 2 (two) times daily. Patient taking differently: Apply 8 g topically 2 (two) times daily.  12/27/16 12/27/17 Yes Jeffery, Chelle, PA-C  nystatin cream (MYCOSTATIN) Apply 1 application topically 2 (two) times daily.   Yes [provider]  oxyCODONE (OXY IR/ROXICODONE) 5 MG immediate release tablet Take 1 tablet (5 mg total) by mouth every 4 (four) hours as needed for severe pain. 10/15/17  Yes Jeffery, Chelle, PA-C  potassium chloride SA (K-DUR,KLOR-CON) 20 MEQ tablet Take 1 tablet (20 mEq total) by mouth 2 (two) times daily. 06/24/17  Yes Jeffery, Chelle, PA-C  traMADol (ULTRAM) 50 MG tablet Take 1 tablet (50 mg total) by mouth 3 (three) times daily as needed. Patient taking differently: Take 50 mg by mouth at bedtime.  10/14/17  Yes Jeffery, Chelle, PA-C  triamcinolone ointment (KENALOG) 0.5 % Apply 1 application topically 2 (two) times daily.   Yes [provider]  vitamin B-12 (CYANOCOBALAMIN) 100 MCG tablet Take 100 mcg by mouth daily.   Yes [provider]   Social  History   Socioeconomic History  . Marital status: Widowed    Spouse name: Newman Pies  . Number of children: 0  . Years of education: college+  . Highest education level: Not on file  Occupational History  . Occupation: disability    Comment: fibromyalgia  Social Needs  . Financial resource strain: Not on file  . Food insecurity:    Worry: Not on file    Inability: Not on file  . Transportation needs:    Medical: Not on file    Non-medical: Not on file  Tobacco Use  . Smoking status: Current Every Day Smoker  Packs/day: 0.30    Years: 33.00    Pack years: 9.90    Types: Cigarettes  . Smokeless tobacco: Never Used  Substance and Sexual Activity  . Alcohol use: No  . Drug use: No  . Sexual activity: Never  Lifestyle  . Physical activity:    Days per week: Not on file    Minutes per session: Not on file  . Stress: Not on file  Relationships  . Social connections:    Talks on phone: Not on file    Gets together: Not on file    Attends religious service: Not on file    Active member of club or organization: Not on file    Attends meetings of clubs or organizations: Not on file    Relationship status: Not on file  . Intimate partner violence:    Fear of current or ex partner: Not on file    Emotionally abused: Not on file    Physically abused: Not on file    Forced sexual activity: Not on file  Other Topics Concern  . Not on file  Social History Narrative   Moved to [place], [place] from [place], [place] 04/28/2016 in need of a change.   Her family remains in [place].   Lives alone.   Review of Systems  Constitutional: Positive for fatigue. Negative for chills and fever (resolved).  HENT: Positive for voice change. Negative for congestion and rhinorrhea.   Eyes: Positive for photophobia.  Cardiovascular: Negative for palpitations.  Musculoskeletal: Positive for neck pain and neck stiffness.  Neurological: Positive for speech difficulty and headaches.       Objective:   Physical Exam  Constitutional: She is oriented to person, place, and time. She appears well-developed and well-nourished.  HENT:  Head: Normocephalic and atraumatic.  Eyes: Pupils are equal, round, and reactive to light. Conjunctivae and EOM are normal.  Squinting eyes on exam, worsened pain with phototopia.  Neck: Carotid bruit is not present.  Able to rotate neck. Some flexion. Slight extension but discomfort.  Cardiovascular: Normal rate, regular rhythm, normal heart sounds and intact distal pulses.  Pulmonary/Chest: Effort normal and breath sounds normal.  Abdominal: Soft. She exhibits no pulsatile midline mass. There is no tenderness.  Neurological: She is alert and oriented to person, place, and time. She displays a negative Romberg sign.  No pronator drift. No focal weakness.  Skin: Skin is warm and dry.  Psychiatric: She has a normal mood and affect. Her behavior is normal.  Vitals reviewed.  Vitals:   12/19/17 1157  BP: 118/74  Pulse: 91  Temp: 98.7 F (37.1 C)  TempSrc: Oral  SpO2: 95%  Weight: (!) 340 lb 12.8 oz (154.6 kg)  Height: 5\' 9"  (1.753 m)   Results for orders placed or performed in visit on 12/19/17  POCT CBC  Result Value Ref Range   WBC 8.9 4.6 - 10.2 K/uL   Lymph, poc 4.3 (A) 0.6 - 3.4   POC LYMPH PERCENT 48.6 10 - 50 %L   MID (cbc) 0.4 0 - 0.9   POC MID % 4.4 0 - 12 %M   POC Granulocyte 4.2 2 - 6.9   Granulocyte percent 47.0 37 - 80 %G   RBC 4.65 4.04 - 5.48 M/uL   Hemoglobin 12.4 12.2 - 16.2 g/dL   HCT, POC 40.9 81.1 - 47.9 %   MCV 83.2 80 - 97 fL   MCH, POC 26.6 (A) 27 - 31.2 pg   MCHC 32.0 31.8 -  35.4 g/dL   RDW, POC 69.6 %   Platelet Count, POC 359 142 - 424 K/uL   MPV 6.9 0 - 99.8 fL  POCT glucose (manual entry)  Result Value Ref Range   POC Glucose 94 70 - 99 mg/dl        Assessment & Plan:   Shandale Malak is a 52 y.o. female Nonintractable episodic headache, unspecified headache type - Plan: AMB referral to  headache clinic  Difficulty with speech  Fatigue, unspecified type - Plan: POCT CBC, POCT glucose (manual entry)  Fibromyalgia  Neck stiffness - Plan: POCT CBC  Hypokalemia - Plan: Basic metabolic panel  Recent treatment for possible atypical migraine versus less likely TIA.  Reassuring neuro imaging.  Reports episode yesterday of change in speech but that also resolved.  Has had some persistent headache which she reports is possible ocular migraine.    -She has a nonfocal neurologic exam in the office, still could be atypical migraine with fatigue complicated by chronic fatigue syndrome and fibromyalgia.  Reassuring CBC and glucose in office.    -Did give option of treating for possible migraine with her home meds today to see if that would clear her symptoms, but if not did recommend further evaluation through emergency room which she agreed with. Due to her reported symptoms of photophobia and neck stiffness as well as recurrence of speech difficulty yesterday  --Hypokalemia noted on previous labs, repeat BMP.  Increase potassium supplement to 20 mEq twice daily  -Referred to headache specialist for possible migraines and to review her current med regimen to see if changes would be recommended.  She is on tramadol, meloxicam, other psychotropics per med list.  No new medication was started.   No orders of the defined types were placed in this encounter.  Patient Instructions   Blood counts and blood sugar look ok today. I did recheck your potassium and electrolytes today, but will not have those results back for a few days.  Continue potassium supplement 20 mEq, but would recommend taking full pill twice per day for now.  Based on the return of your difficulty with speech yesterday, as well as headache, neck stiffness and reported fevers previously, would recommend further evaluation through emergency room today if those symptoms persist.   You can try your ultram to see if that resolves  your symptoms.   I did place a referral to a specialist in case these are atypical migraines to decide on changes in your medication regimen.   General Headache Without Cause A headache is pain or discomfort felt around the head or neck area. There are many causes and types of headaches. In some cases, the cause may not be found. Follow these instructions at home: Managing pain  Take over-the-counter and prescription medicines only as told by your doctor.  Lie down in a dark, quiet room when you have a headache.  If directed, apply ice to the head and neck area: ? Put ice in a plastic bag. ? Place a towel between your skin and the bag. ? Leave the ice on for 20 minutes, 2-3 times per day.  Use a heating pad or hot shower to apply heat to the head and neck area as told by your doctor.  Keep lights dim if bright lights bother you or make your headaches worse. Eating and drinking  Eat meals on a regular schedule.  Lessen how much alcohol you drink.  Lessen how much caffeine you drink, or stop drinking  caffeine. General instructions  Keep all follow-up visits as told by your doctor. This is important.  Keep a journal to find out if certain things bring on headaches. For example, write down: ? What you eat and drink. ? How much sleep you get. ? Any change to your diet or medicines.  Relax by getting a massage or doing other relaxing activities.  Lessen stress.  Sit up straight. Do not tighten (tense) your muscles.  Do not use tobacco products. This includes cigarettes, chewing tobacco, or e-cigarettes. If you need help quitting, ask your doctor.  Exercise regularly as told by your doctor.  Get enough sleep. This often means 7-9 hours of sleep. Contact a doctor if:  Your symptoms are not helped by medicine.  You have a headache that feels different than the other headaches.  You feel sick to your stomach (nauseous) or you throw up (vomit).  You have a fever. Get  help right away if:  Your headache becomes really bad.  You keep throwing up.  You have a stiff neck.  You have trouble seeing.  You have trouble speaking.  You have pain in the eye or ear.  Your muscles are weak or you lose muscle control.  You lose your balance or have trouble walking.  You feel like you will pass out (faint) or you pass out.  You have confusion. This information is not intended to replace advice given to you by your health care provider. Make sure you discuss any questions you have with your health care provider. Document Released: 03/20/2008 Document Revised: 11/17/2015 Document Reviewed: 10/04/2014 Elsevier Interactive Patient Education  2018 [employer]  Fatigue Fatigue is feeling tired all of the time, a lack of energy, or a lack of motivation. Occasional or mild fatigue is often a normal response to activity or life in general. However, long-lasting (chronic) or extreme fatigue may indicate an underlying medical condition. Follow these instructions at home: Watch your fatigue for any changes. The following actions may help to lessen any discomfort you are feeling:  Talk to your health care provider about how much sleep you need each night. Try to get the required amount every night.  Take medicines only as directed by your health care provider.  Eat a healthy and nutritious diet. Ask your health care provider if you need help changing your diet.  Drink enough fluid to keep your urine clear or pale yellow.  Practice ways of relaxing, such as yoga, meditation, massage therapy, or acupuncture.  Exercise regularly.  Change situations that cause you stress. Try to keep your work and personal routine reasonable.  Do not abuse illegal drugs.  Limit alcohol intake to no more than 1 drink per day for nonpregnant women and 2 drinks per day for men. One drink equals 12 ounces of beer, 5 ounces of wine, or 1 ounces of hard liquor.  Take a multivitamin,  if directed by your health care provider.  Contact a health care provider if:  Your fatigue does not get better.  You have a fever.  You have unintentional weight loss or gain.  You have headaches.  You have difficulty: ? Falling asleep. ? Sleeping throughout the night.  You feel angry, guilty, anxious, or sad.  You are unable to have a bowel movement (constipation).  You skin is dry.  Your legs or another part of your body is swollen. Get help right away if:  You feel confused.  Your vision is blurry.  You feel  faint or pass out.  You have a severe headache.  You have severe abdominal, pelvic, or back pain.  You have chest pain, shortness of breath, or an irregular or fast heartbeat.  You are unable to urinate or you urinate less than normal.  You develop abnormal bleeding, such as bleeding from the rectum, vagina, nose, lungs, or nipples.  You vomit blood.  You have thoughts about harming yourself or committing suicide.  You are worried that you might harm someone else. This information is not intended to replace advice given to you by your health care provider. Make sure you discuss any questions you have with your health care provider. Document Released: 04/08/2007 Document Revised: 11/17/2015 Document Reviewed: 10/13/2013 Elsevier Interactive Patient Education  2018 [employer].   IF you received an x-ray today, you will receive an invoice from [facility]. Please contact [facility] at [phone] with questions or concerns regarding your invoice.   IF you received labwork today, you will receive an invoice from [facility]. Please contact [facility] at [phone] with questions or concerns regarding your invoice.   Our billing staff will not be able to assist you with questions regarding bills from these companies.  You will be contacted with the lab results as soon as they are available. The fastest way to get your results is to  activate your My Chart account. Instructions are located on the last page of this paperwork. If you have not heard from [place] regarding the results in 2 weeks, please contact this office.       I personally performed the services described in this documentation, which was scribed in my presence. The recorded information has been reviewed and considered for accuracy and completeness, addended by me as needed, and agree with information above.  Signed,   Meredith Staggers, MD Primary Care at [facility].  12/19/17 1:28 PM

## 2017-12-19 NOTE — Telephone Encounter (Signed)
Copied from [place] [id number]. Topic: Quick Communication - See Telephone Encounter >> Dec 19, 2017  2:04 PM Bea Graff, NT wrote: CRM for notification. See Telephone encounter for: 12/19/17. Don OT with [facility] calling to get verbal orders for 1 time a week for 3 weeks. For ADL, transfers, exercise, and IADL. D/c when goals or max potential met. CB#: [phone]

## 2017-12-19 NOTE — Patient Instructions (Addendum)
Blood counts and blood sugar look ok today. I did recheck your potassium and electrolytes today, but will not have those results back for a few days.  Continue potassium supplement 20 mEq, but would recommend taking full pill twice per day for now.  Based on the return of your difficulty with speech yesterday, as well as headache, neck stiffness and reported fevers previously, would recommend further evaluation through emergency room today if those symptoms persist.   You can try your ultram to see if that resolves your symptoms.   I did place a referral to a specialist in case these are atypical migraines to decide on changes in your medication regimen.   General Headache Without Cause A headache is pain or discomfort felt around the head or neck area. There are many causes and types of headaches. In some cases, the cause may not be found. Follow these instructions at home: Managing pain  Take over-the-counter and prescription medicines only as told by your doctor.  Lie down in a dark, quiet room when you have a headache.  If directed, apply ice to the head and neck area: ? Put ice in a plastic bag. ? Place a towel between your skin and the bag. ? Leave the ice on for 20 minutes, 2-3 times per day.  Use a heating pad or hot shower to apply heat to the head and neck area as told by your doctor.  Keep lights dim if bright lights bother you or make your headaches worse. Eating and drinking  Eat meals on a regular schedule.  Lessen how much alcohol you drink.  Lessen how much caffeine you drink, or stop drinking caffeine. General instructions  Keep all follow-up visits as told by your doctor. This is important.  Keep a journal to find out if certain things bring on headaches. For example, write down: ? What you eat and drink. ? How much sleep you get. ? Any change to your diet or medicines.  Relax by getting a massage or doing other relaxing activities.  Lessen stress.  Sit  up straight. Do not tighten (tense) your muscles.  Do not use tobacco products. This includes cigarettes, chewing tobacco, or e-cigarettes. If you need help quitting, ask your doctor.  Exercise regularly as told by your doctor.  Get enough sleep. This often means 7-9 hours of sleep. Contact a doctor if:  Your symptoms are not helped by medicine.  You have a headache that feels different than the other headaches.  You feel sick to your stomach (nauseous) or you throw up (vomit).  You have a fever. Get help right away if:  Your headache becomes really bad.  You keep throwing up.  You have a stiff neck.  You have trouble seeing.  You have trouble speaking.  You have pain in the eye or ear.  Your muscles are weak or you lose muscle control.  You lose your balance or have trouble walking.  You feel like you will pass out (faint) or you pass out.  You have confusion. This information is not intended to replace advice given to you by your health care provider. Make sure you discuss any questions you have with your health care provider. Document Released: 03/20/2008 Document Revised: 11/17/2015 Document Reviewed: 10/04/2014 Elsevier Interactive Patient Education  2018 [employer]  Fatigue Fatigue is feeling tired all of the time, a lack of energy, or a lack of motivation. Occasional or mild fatigue is often a normal response to activity or  life in general. However, long-lasting (chronic) or extreme fatigue may indicate an underlying medical condition. Follow these instructions at home: Watch your fatigue for any changes. The following actions may help to lessen any discomfort you are feeling:  Talk to your health care provider about how much sleep you need each night. Try to get the required amount every night.  Take medicines only as directed by your health care provider.  Eat a healthy and nutritious diet. Ask your health care provider if you need help changing your  diet.  Drink enough fluid to keep your urine clear or pale yellow.  Practice ways of relaxing, such as yoga, meditation, massage therapy, or acupuncture.  Exercise regularly.  Change situations that cause you stress. Try to keep your work and personal routine reasonable.  Do not abuse illegal drugs.  Limit alcohol intake to no more than 1 drink per day for nonpregnant women and 2 drinks per day for men. One drink equals 12 ounces of beer, 5 ounces of wine, or 1 ounces of hard liquor.  Take a multivitamin, if directed by your health care provider.  Contact a health care provider if:  Your fatigue does not get better.  You have a fever.  You have unintentional weight loss or gain.  You have headaches.  You have difficulty: ? Falling asleep. ? Sleeping throughout the night.  You feel angry, guilty, anxious, or sad.  You are unable to have a bowel movement (constipation).  You skin is dry.  Your legs or another part of your body is swollen. Get help right away if:  You feel confused.  Your vision is blurry.  You feel faint or pass out.  You have a severe headache.  You have severe abdominal, pelvic, or back pain.  You have chest pain, shortness of breath, or an irregular or fast heartbeat.  You are unable to urinate or you urinate less than normal.  You develop abnormal bleeding, such as bleeding from the rectum, vagina, nose, lungs, or nipples.  You vomit blood.  You have thoughts about harming yourself or committing suicide.  You are worried that you might harm someone else. This information is not intended to replace advice given to you by your health care provider. Make sure you discuss any questions you have with your health care provider. Document Released: 04/08/2007 Document Revised: 11/17/2015 Document Reviewed: 10/13/2013 Elsevier Interactive Patient Education  2018 [employer].   IF you received an x-ray today, you will receive an invoice  from [facility]. Please contact [facility] at [phone] with questions or concerns regarding your invoice.   IF you received labwork today, you will receive an invoice from [facility]. Please contact [facility] at [phone] with questions or concerns regarding your invoice.   Our billing staff will not be able to assist you with questions regarding bills from these companies.  You will be contacted with the lab results as soon as they are available. The fastest way to get your results is to activate your My Chart account. Instructions are located on the last page of this paperwork. If you have not heard from [place] regarding the results in 2 weeks, please contact this office.

## 2017-12-19 NOTE — Telephone Encounter (Signed)
Please advise. Brianna Roberts, D

## 2017-12-19 NOTE — Telephone Encounter (Signed)
It seems that you saw this patient for hospital follow-up.

## 2017-12-20 DIAGNOSIS — I10 Essential (primary) hypertension: Secondary | ICD-10-CM | POA: Diagnosis not present

## 2017-12-20 DIAGNOSIS — M797 Fibromyalgia: Secondary | ICD-10-CM | POA: Diagnosis not present

## 2017-12-20 DIAGNOSIS — M170 Bilateral primary osteoarthritis of knee: Secondary | ICD-10-CM | POA: Diagnosis not present

## 2017-12-20 DIAGNOSIS — M5030 Other cervical disc degeneration, unspecified cervical region: Secondary | ICD-10-CM | POA: Diagnosis not present

## 2017-12-20 DIAGNOSIS — M1611 Unilateral primary osteoarthritis, right hip: Secondary | ICD-10-CM | POA: Diagnosis not present

## 2017-12-20 LAB — BASIC METABOLIC PANEL
BUN / CREAT RATIO: 11 (ref 9–23)
BUN: 11 mg/dL (ref 6–24)
CALCIUM: 9.6 mg/dL (ref 8.7–10.2)
CO2: 24 mmol/L (ref 20–29)
CREATININE: 1.02 mg/dL — AB (ref 0.57–1.00)
Chloride: 105 mmol/L (ref 96–106)
GFR, EST AFRICAN AMERICAN: 74 mL/min/{1.73_m2} (ref >59)
GFR, EST NON AFRICAN AMERICAN: 64 mL/min/{1.73_m2} (ref >59)
Glucose: 88 mg/dL (ref 65–99)
Potassium: 4 mmol/L (ref 3.5–5.2)
Sodium: 144 mmol/L (ref 134–144)

## 2017-12-20 NOTE — Telephone Encounter (Signed)
Called Don at provided number for verbal order. Left message to call me back.

## 2017-12-20 NOTE — Telephone Encounter (Signed)
[place] this [place] and verbal order given with RTC precautions discussed during therapy.

## 2017-12-23 DIAGNOSIS — M170 Bilateral primary osteoarthritis of knee: Secondary | ICD-10-CM | POA: Diagnosis not present

## 2017-12-23 DIAGNOSIS — I10 Essential (primary) hypertension: Secondary | ICD-10-CM | POA: Diagnosis not present

## 2017-12-23 DIAGNOSIS — M1611 Unilateral primary osteoarthritis, right hip: Secondary | ICD-10-CM | POA: Diagnosis not present

## 2017-12-23 DIAGNOSIS — M5030 Other cervical disc degeneration, unspecified cervical region: Secondary | ICD-10-CM | POA: Diagnosis not present

## 2017-12-23 DIAGNOSIS — M797 Fibromyalgia: Secondary | ICD-10-CM | POA: Diagnosis not present

## 2017-12-24 DIAGNOSIS — M5030 Other cervical disc degeneration, unspecified cervical region: Secondary | ICD-10-CM | POA: Diagnosis not present

## 2017-12-24 DIAGNOSIS — M797 Fibromyalgia: Secondary | ICD-10-CM | POA: Diagnosis not present

## 2017-12-24 DIAGNOSIS — I10 Essential (primary) hypertension: Secondary | ICD-10-CM | POA: Diagnosis not present

## 2017-12-24 DIAGNOSIS — M170 Bilateral primary osteoarthritis of knee: Secondary | ICD-10-CM | POA: Diagnosis not present

## 2017-12-24 DIAGNOSIS — M1611 Unilateral primary osteoarthritis, right hip: Secondary | ICD-10-CM | POA: Diagnosis not present

## 2017-12-25 DIAGNOSIS — G4700 Insomnia, unspecified: Secondary | ICD-10-CM | POA: Diagnosis not present

## 2017-12-25 DIAGNOSIS — M549 Dorsalgia, unspecified: Secondary | ICD-10-CM | POA: Diagnosis not present

## 2017-12-25 DIAGNOSIS — R471 Dysarthria and anarthria: Secondary | ICD-10-CM | POA: Diagnosis not present

## 2017-12-25 DIAGNOSIS — R5382 Chronic fatigue, unspecified: Secondary | ICD-10-CM | POA: Diagnosis not present

## 2017-12-25 DIAGNOSIS — M797 Fibromyalgia: Secondary | ICD-10-CM | POA: Diagnosis not present

## 2017-12-25 DIAGNOSIS — R51 Headache: Secondary | ICD-10-CM | POA: Diagnosis not present

## 2017-12-25 DIAGNOSIS — M6281 Muscle weakness (generalized): Secondary | ICD-10-CM | POA: Diagnosis not present

## 2017-12-25 DIAGNOSIS — B372 Candidiasis of skin and nail: Secondary | ICD-10-CM | POA: Diagnosis not present

## 2017-12-25 DIAGNOSIS — R69 Illness, unspecified: Secondary | ICD-10-CM | POA: Diagnosis not present

## 2017-12-27 DIAGNOSIS — I10 Essential (primary) hypertension: Secondary | ICD-10-CM | POA: Diagnosis not present

## 2017-12-27 DIAGNOSIS — M170 Bilateral primary osteoarthritis of knee: Secondary | ICD-10-CM | POA: Diagnosis not present

## 2017-12-27 DIAGNOSIS — M797 Fibromyalgia: Secondary | ICD-10-CM | POA: Diagnosis not present

## 2017-12-27 DIAGNOSIS — M5030 Other cervical disc degeneration, unspecified cervical region: Secondary | ICD-10-CM | POA: Diagnosis not present

## 2017-12-27 DIAGNOSIS — M1611 Unilateral primary osteoarthritis, right hip: Secondary | ICD-10-CM | POA: Diagnosis not present

## 2017-12-31 DIAGNOSIS — M797 Fibromyalgia: Secondary | ICD-10-CM | POA: Diagnosis not present

## 2018-01-01 DIAGNOSIS — I10 Essential (primary) hypertension: Secondary | ICD-10-CM | POA: Diagnosis not present

## 2018-01-01 DIAGNOSIS — M797 Fibromyalgia: Secondary | ICD-10-CM | POA: Diagnosis not present

## 2018-01-01 DIAGNOSIS — M1611 Unilateral primary osteoarthritis, right hip: Secondary | ICD-10-CM | POA: Diagnosis not present

## 2018-01-01 DIAGNOSIS — M5030 Other cervical disc degeneration, unspecified cervical region: Secondary | ICD-10-CM | POA: Diagnosis not present

## 2018-01-01 DIAGNOSIS — M170 Bilateral primary osteoarthritis of knee: Secondary | ICD-10-CM | POA: Diagnosis not present

## 2018-01-02 DIAGNOSIS — M797 Fibromyalgia: Secondary | ICD-10-CM | POA: Diagnosis not present

## 2018-01-02 DIAGNOSIS — M1611 Unilateral primary osteoarthritis, right hip: Secondary | ICD-10-CM | POA: Diagnosis not present

## 2018-01-02 DIAGNOSIS — M170 Bilateral primary osteoarthritis of knee: Secondary | ICD-10-CM | POA: Diagnosis not present

## 2018-01-02 DIAGNOSIS — I10 Essential (primary) hypertension: Secondary | ICD-10-CM | POA: Diagnosis not present

## 2018-01-02 DIAGNOSIS — M5030 Other cervical disc degeneration, unspecified cervical region: Secondary | ICD-10-CM | POA: Diagnosis not present

## 2018-01-03 DIAGNOSIS — M797 Fibromyalgia: Secondary | ICD-10-CM | POA: Diagnosis not present

## 2018-01-03 DIAGNOSIS — M170 Bilateral primary osteoarthritis of knee: Secondary | ICD-10-CM | POA: Diagnosis not present

## 2018-01-03 DIAGNOSIS — I10 Essential (primary) hypertension: Secondary | ICD-10-CM | POA: Diagnosis not present

## 2018-01-03 DIAGNOSIS — M1611 Unilateral primary osteoarthritis, right hip: Secondary | ICD-10-CM | POA: Diagnosis not present

## 2018-01-03 DIAGNOSIS — M5030 Other cervical disc degeneration, unspecified cervical region: Secondary | ICD-10-CM | POA: Diagnosis not present

## 2018-01-06 DIAGNOSIS — I10 Essential (primary) hypertension: Secondary | ICD-10-CM | POA: Diagnosis not present

## 2018-01-06 DIAGNOSIS — M170 Bilateral primary osteoarthritis of knee: Secondary | ICD-10-CM | POA: Diagnosis not present

## 2018-01-06 DIAGNOSIS — M1611 Unilateral primary osteoarthritis, right hip: Secondary | ICD-10-CM | POA: Diagnosis not present

## 2018-01-06 DIAGNOSIS — M797 Fibromyalgia: Secondary | ICD-10-CM | POA: Diagnosis not present

## 2018-01-06 DIAGNOSIS — M5030 Other cervical disc degeneration, unspecified cervical region: Secondary | ICD-10-CM | POA: Diagnosis not present

## 2018-01-15 DIAGNOSIS — I10 Essential (primary) hypertension: Secondary | ICD-10-CM | POA: Diagnosis not present

## 2018-01-15 DIAGNOSIS — M5030 Other cervical disc degeneration, unspecified cervical region: Secondary | ICD-10-CM | POA: Diagnosis not present

## 2018-01-15 DIAGNOSIS — M1611 Unilateral primary osteoarthritis, right hip: Secondary | ICD-10-CM | POA: Diagnosis not present

## 2018-01-15 DIAGNOSIS — M797 Fibromyalgia: Secondary | ICD-10-CM | POA: Diagnosis not present

## 2018-01-15 DIAGNOSIS — M170 Bilateral primary osteoarthritis of knee: Secondary | ICD-10-CM | POA: Diagnosis not present

## 2018-01-27 DIAGNOSIS — Z049 Encounter for examination and observation for unspecified reason: Secondary | ICD-10-CM | POA: Diagnosis not present

## 2018-01-27 DIAGNOSIS — G43709 Chronic migraine without aura, not intractable, without status migrainosus: Secondary | ICD-10-CM | POA: Diagnosis not present

## 2018-01-27 DIAGNOSIS — G43019 Migraine without aura, intractable, without status migrainosus: Secondary | ICD-10-CM | POA: Diagnosis not present

## 2018-01-31 DIAGNOSIS — M797 Fibromyalgia: Secondary | ICD-10-CM | POA: Diagnosis not present

## 2018-02-07 DIAGNOSIS — B333 Retrovirus infections, not elsewhere classified: Secondary | ICD-10-CM | POA: Diagnosis not present

## 2018-02-07 DIAGNOSIS — M797 Fibromyalgia: Secondary | ICD-10-CM | POA: Diagnosis not present

## 2018-02-14 ENCOUNTER — Ambulatory Visit: Payer: Medicare HMO | Admitting: Neurology

## 2018-02-14 NOTE — Progress Notes (Deleted)
[facility] Clinic Note - Initial Visit   Date: 02/14/18  Brianna Roberts: [id number] DOB: August 30, 1965   Dear Dr Marland KitchenNo primary care provider on file.:  Thank you for your kind referral of Brianna Roberts for consultation of ***. Although *** history is well known to you, please allow [place] to reiterate it for the purpose of our medical record. The patient was accompanied to the clinic by *** who also provides collateral information.     History of Present Illness: Brianna Roberts is a 52 y.o. ***-handed Caucasian/*** ***female with fibromyalgia, depression/anxiety, HIV, hypothyroidism, *** presenting for evaluation of ***.     She had two ER visits in June for migraines.  On 6/22, she presented with sudden onset of garbled speech, which was noted by individuals she was talking on the phone with and associated with left ocular migraine.  In the ER, she underwent MRI/A head and MRA neck which was normal and treated for complex migraine.  Two days later on 6/24, she presented again with left sided migraine with left visual field changes, described as graying of color.  Again, she was treated for migraine with ***.    Out-side paper records, electronic medical record, and images have been reviewed where available and summarized as: *** MRI/A head, MRA neck 12/14/2017: 1. Normal brain MRI. No acute intracranial infarct or other abnormality identified. 2. Normal intracranial MRA. 3. Normal MRA of the neck.   Past Medical History:  Diagnosis Date  . Allergy   . Anxiety   . Arthritis   . Asthma   . 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 Concussion   . DDD (degenerative disc disease), cervical   . Depression   . Fibromyalgia   . HIV (human immunodeficiency virus infection) (HCC)   . Homelessness   . Hypertension   . Hypothyroidism   . Insomnia   . Neuromuscular disorder (HCC)   . Thyroid disease   . TIA (transient ischemic  attack)     Past Surgical History:  Procedure Laterality Date  . ABDOMINAL HYSTERECTOMY     Partial-ovaries and tubes remain  . CHOLECYSTECTOMY  2006  . COLONOSCOPY  07/27/2015   [place]  . HERNIA REPAIR  1997  . TUMOR EXCISION Right 1984   Sinuses  . TUMOR EXCISION Left 2015   sinuses  . URACHAL CYST EXCISION  1997     Medications:  Outpatient Encounter Medications as of 02/14/2018  Medication Sig Note  . acetaminophen (TYLENOL) 325 MG tablet Take 650 mg by mouth every 6 (six) hours as needed (for pain).   Marland Kitchen acyclovir (ZOVIRAX) 200 MG capsule Take 1 capsule (200 mg total) by mouth 2 (two) times daily. For Outbreak: 2 capsules (400 mg) TID x 5 days. (Patient taking differently: Take 400 mg by mouth 2 (two) times daily. ) 12/13/2017: This reflects the patient's updated regimen  . albuterol (PROVENTIL HFA;VENTOLIN HFA) 108 (90 Base) MCG/ACT inhaler Inhale 1-2 puffs into the lungs every 6 (six) hours as needed for wheezing or shortness of breath.   Marland Kitchen amitriptyline (ELAVIL) 75 MG tablet Take 1 tablet (75 mg total) by mouth at bedtime.   Marland Kitchen amLODipine (NORVASC) 10 MG tablet Take 1 tablet (10 mg total) by mouth daily.   . ARIPiprazole (ABILIFY) 10 MG tablet Take 1 tablet (10 mg total) by mouth daily.   . ASPERCREME LIDOCAINE EX Apply 1 application topically 2 (two) times daily as needed (for muscle pain).   Marland Kitchen  azelastine (ASTELIN) 0.1 % nasal spray USE 2 SPRAYS EACH NOSTRIL TWICE DAILY.   . budesonide-formoterol (SYMBICORT) 160-4.5 MCG/ACT inhaler Inhale 2 puffs into the lungs 2 (two) times daily.   . diazepam (VALIUM) 5 MG tablet Take 1 tablet (5 mg total) by mouth every 8 (eight) hours as needed for anxiety.   . diphenhydrAMINE (BENADRYL) 25 mg capsule Take 25 mg by mouth 2 (two) times daily.   . fluticasone (FLONASE) 50 MCG/ACT nasal spray USE 1 SPRAY IN EACH NOSTRIL DAILY.   . furosemide (LASIX) 40 MG tablet Take 1 tablet (40 mg total) by mouth 2 (two) times daily. (Patient taking  differently: Take 80 mg by mouth at bedtime. )   . glucose blood test strip Use as instructed   . hydrOXYzine (ATARAX/VISTARIL) 10 MG tablet Take 1 tablet (10 mg total) by mouth at bedtime. (Patient taking differently: Take 10 mg by mouth at bedtime as needed (itching or angioedema). )   . Incontinence Supply Disposable (PREVAIL WASHCLOTHS) MISC Use daily as directed   . ipratropium-albuterol (DUONEB) 0.5-2.5 (3) MG/3ML SOLN Take 3 mLs by nebulization every 6 (six) hours as needed (for breathing). (Patient taking differently: Take 3 mLs by nebulization every 6 (six) hours as needed (for shortness of breath or wheezing). )   . Lancets Thin MISC Use to check home glucose daily   . levothyroxine (SYNTHROID, LEVOTHROID) 50 MCG tablet Take 1 tablet (50 mcg total) by mouth daily before breakfast. (Patient taking differently: Take 50 mcg by mouth daily. )   . losartan (COZAAR) 100 MG tablet Take 1 tablet (100 mg total) by mouth daily.   . meloxicam (MOBIC) 15 MG tablet TAKE 1 TABLET EACH DAY.   . methocarbamol (ROBAXIN) 500 MG tablet Take 2 tablets (1,000 mg total) by mouth every 8 (eight) hours as needed for muscle spasms.   . metoprolol tartrate (LOPRESSOR) 100 MG tablet Take 1 tablet (100 mg total) by mouth 2 (two) times daily.   Celedonio Miyamoto. Nerve Stimulator (PRO COMFORT TENS UNIT) DEVI 1 Device by Does not apply route daily as needed.   . nystatin cream (MYCOSTATIN) Apply 1 application topically 2 (two) times daily.   Marland Kitchen. oxyCODONE (OXY IR/ROXICODONE) 5 MG immediate release tablet Take 1 tablet (5 mg total) by mouth every 4 (four) hours as needed for severe pain.   . potassium chloride SA (K-DUR,KLOR-CON) 20 MEQ tablet Take 1 tablet (20 mEq total) by mouth 2 (two) times daily.   . traMADol (ULTRAM) 50 MG tablet Take 1 tablet (50 mg total) by mouth 3 (three) times daily as needed. (Patient taking differently: Take 50 mg by mouth at bedtime. )   . triamcinolone ointment (KENALOG) 0.5 % Apply 1 application topically 2  (two) times daily.   . vitamin B-12 (CYANOCOBALAMIN) 100 MCG tablet Take 100 mcg by mouth daily.    No facility-administered encounter medications on file as of 02/14/2018.      Allergies:  Allergies  Allergen Reactions  . Iodinated Diagnostic Agents Anaphylaxis and Other (See Comments)    Patient complained of throat/ uvula swelling s/p injection on 02/01/2017 but states she isnt allergic to the dye and has had several times prior   . Sulfa Antibiotics Anaphylaxis  . Codeine Other (See Comments)    Unknown - patient said mother told her she was allergic as a child  . Dilaudid [Hydromorphone Hcl] Itching  . Penicillins Other (See Comments)    Told she was allergic from childhood: Has patient had a  PCN reaction causing immediate rash, facial/tongue/throat swelling, SOB or lightheadedness with hypotension: Unk Has patient had a PCN reaction causing severe rash involving mucus membranes or skin necrosis: Unk Has patient had a PCN reaction that required hospitalization: Unk Has patient had a PCN reaction occurring within the last 10 years: No If all of the above answers are "NO", then may proceed with Cephalosporin use.  Marland Kitchen Spironolactone Other (See Comments)    "GI Issues"  . Adhesive [Tape] Rash  . Nickel Rash    Family History: Family History  Problem Relation Age of Onset  . Diabetes Mother   . Heart disease Mother   . Hyperlipidemia Mother   . Hypertension Mother   . Stroke Mother   . Mental illness Mother   . Heart failure Mother   . Headache Mother        Joellyn Quails Cyst  . Breast cancer Mother 76  . Heart disease Father   . Alcohol abuse Father   . Hypertension Sister   . Heart disease Brother   . Mental illness Brother   . COPD Brother   . Heart failure Brother   . Hypertension Sister   . Colon cancer Neg Hx     Social History: Social History   Tobacco Use  . Smoking status: Current Every Day Smoker    Packs/day: 0.30    Years: 33.00    Pack years: 9.90     Types: Cigarettes  . Smokeless tobacco: Never Used  Substance Use Topics  . Alcohol use: No  . Drug use: No   Social History   Social History Narrative   Moved to [place], [place] from [place], [place] 04/28/2016 in need of a change.   Her family remains in [place].   Lives alone.    Review of Systems:  CONSTITUTIONAL: No fevers, chills, night sweats, or weight loss.  *** EYES: No visual changes or eye pain ENT: No hearing changes.  No history of nose bleeds.   RESPIRATORY: No cough, wheezing and shortness of breath.   CARDIOVASCULAR: Negative for chest pain, and palpitations.   GI: Negative for abdominal discomfort, blood in stools or black stools.  No recent change in bowel habits.   GU:  No history of incontinence.   MUSCLOSKELETAL: No history of joint pain or swelling.  No myalgias.   SKIN: Negative for lesions, rash, and itching.   HEMATOLOGY/ONCOLOGY: Negative for prolonged bleeding, bruising easily, and swollen nodes.  No history of cancer.   ENDOCRINE: Negative for cold or heat intolerance, polydipsia or goiter.   PSYCH:  ***depression or anxiety symptoms.   NEURO: As Above.   Vital Signs:  There were no vitals taken for this visit. Pain Scale: *** on a scale of 0-10   General Medical Exam:  *** General:  Well appearing, comfortable.   Eyes/ENT: see cranial nerve examination.   Neck: No masses appreciated.   No carotid bruits. Respiratory:  Clear to auscultation, good air entry bilaterally.   Cardiac:  Regular rate and rhythm, no murmur.   Extremities:  No deformities, edema, or skin discoloration.  Skin:  No rashes or lesions.  Neurological Exam: MENTAL STATUS including orientation to time, place, person, recent and remote memory, attention span and concentration, language, and fund of knowledge is normal.  Speech is not dysarthric.  CRANIAL NERVES: II:  No visual field defects.  Unremarkable fundi.   III-IV-VI: Pupils equal round and reactive to light.   Normal conjugate, extra-ocular eye movements in all  directions of gaze.  No nystagmus.  No ptosis.   V:  Normal facial sensation.  VII:  Normal facial symmetry and movements.  VIII:  Normal hearing and vestibular function.   IX-X:  Normal palatal movement.   XI:  Normal shoulder shrug and head rotation.   XII:  Normal tongue strength and range of motion, no deviation or fasciculation.  MOTOR:  Motor strength is 5/5 throughout.  No atrophy, fasciculations or abnormal movements.  No pronator drift.  Tone is normal.    MSRs:  Right                                                                 Left brachioradialis 2+  brachioradialis 2+  biceps 2+  biceps 2+  triceps 2+  triceps 2+  patellar 2+  patellar 2+  ankle jerk 2+  ankle jerk 2+  Hoffman no  Hoffman no  plantar response down  plantar response down   SENSORY:  Normal and symmetric perception of light touch, pinprick, vibration, and proprioception.  Romberg's sign absent.   COORDINATION/GAIT: Normal finger-to- nose-finger. Intact rapid alternating movements bilaterally.  Gait narrow based and stable.    IMPRESSION: Complex migraine associated with speech and vision changes.   ***  PLAN/RECOMMENDATIONS:  *** Return to clinic in *** months.    Thank you for allowing me to participate in patient's care.  If I can answer any additional questions, I would be pleased to do so.    Sincerely,    Rilda Bulls K. Allena Katz, DO

## 2018-03-03 DIAGNOSIS — M797 Fibromyalgia: Secondary | ICD-10-CM | POA: Diagnosis not present

## 2018-03-04 ENCOUNTER — Other Ambulatory Visit: Payer: Self-pay | Admitting: Physician Assistant

## 2018-03-04 DIAGNOSIS — Z1231 Encounter for screening mammogram for malignant neoplasm of breast: Secondary | ICD-10-CM

## 2018-03-07 ENCOUNTER — Ambulatory Visit: Payer: Medicare HMO

## 2018-03-20 ENCOUNTER — Ambulatory Visit: Payer: Medicare HMO

## 2018-03-20 ENCOUNTER — Ambulatory Visit
Admission: RE | Admit: 2018-03-20 | Discharge: 2018-03-20 | Disposition: A | Discharge status: Home / Self Care | Payer: Medicare HMO | Source: Ambulatory Visit | Attending: Physician Assistant | Admitting: Physician Assistant

## 2018-03-20 DIAGNOSIS — Z1231 Encounter for screening mammogram for malignant neoplasm of breast: Secondary | ICD-10-CM

## 2018-03-24 ENCOUNTER — Ambulatory Visit: Payer: Medicare HMO | Admitting: Neurology

## 2018-04-02 DIAGNOSIS — M797 Fibromyalgia: Secondary | ICD-10-CM | POA: Diagnosis not present

## 2018-04-03 ENCOUNTER — Ambulatory Visit: Payer: Medicare HMO

## 2018-04-09 ENCOUNTER — Ambulatory Visit: Payer: Medicare HMO | Admitting: Neurology

## 2018-04-09 ENCOUNTER — Other Ambulatory Visit: Payer: Self-pay

## 2018-04-09 DIAGNOSIS — F418 Other specified anxiety disorders: Secondary | ICD-10-CM | POA: Diagnosis not present

## 2018-04-09 DIAGNOSIS — I10 Essential (primary) hypertension: Secondary | ICD-10-CM | POA: Diagnosis not present

## 2018-04-09 DIAGNOSIS — R69 Illness, unspecified: Secondary | ICD-10-CM | POA: Diagnosis not present

## 2018-04-09 DIAGNOSIS — Z0000 Encounter for general adult medical examination without abnormal findings: Secondary | ICD-10-CM | POA: Diagnosis not present

## 2018-04-09 DIAGNOSIS — M549 Dorsalgia, unspecified: Secondary | ICD-10-CM | POA: Diagnosis not present

## 2018-04-09 DIAGNOSIS — Z6841 Body Mass Index (BMI) 40.0 and over, adult: Secondary | ICD-10-CM | POA: Diagnosis not present

## 2018-04-09 DIAGNOSIS — R7303 Prediabetes: Secondary | ICD-10-CM | POA: Diagnosis not present

## 2018-04-09 DIAGNOSIS — G4733 Obstructive sleep apnea (adult) (pediatric): Secondary | ICD-10-CM | POA: Diagnosis not present

## 2018-04-09 DIAGNOSIS — M6281 Muscle weakness (generalized): Secondary | ICD-10-CM | POA: Diagnosis not present

## 2018-04-09 DIAGNOSIS — E039 Hypothyroidism, unspecified: Secondary | ICD-10-CM | POA: Diagnosis not present

## 2018-04-09 DIAGNOSIS — G894 Chronic pain syndrome: Secondary | ICD-10-CM | POA: Diagnosis not present

## 2018-04-09 DIAGNOSIS — Z23 Encounter for immunization: Secondary | ICD-10-CM | POA: Diagnosis not present

## 2018-04-09 NOTE — Telephone Encounter (Signed)
Pt seen by Neva Seat on 12/19/17 and verbal orders given to [facility] by Neva Seat on 12/20/17.  See 12/19/17 and 12/20/17 telephone notes.

## 2018-04-11 DIAGNOSIS — R7303 Prediabetes: Secondary | ICD-10-CM | POA: Diagnosis not present

## 2018-04-11 DIAGNOSIS — E039 Hypothyroidism, unspecified: Secondary | ICD-10-CM | POA: Diagnosis not present

## 2018-04-11 DIAGNOSIS — R69 Illness, unspecified: Secondary | ICD-10-CM | POA: Diagnosis not present

## 2018-04-11 DIAGNOSIS — M6281 Muscle weakness (generalized): Secondary | ICD-10-CM | POA: Diagnosis not present

## 2018-04-11 DIAGNOSIS — B009 Herpesviral infection, unspecified: Secondary | ICD-10-CM | POA: Diagnosis not present

## 2018-04-11 DIAGNOSIS — M545 Low back pain: Secondary | ICD-10-CM | POA: Diagnosis not present

## 2018-04-11 DIAGNOSIS — I10 Essential (primary) hypertension: Secondary | ICD-10-CM | POA: Diagnosis not present

## 2018-04-11 DIAGNOSIS — G894 Chronic pain syndrome: Secondary | ICD-10-CM | POA: Diagnosis not present

## 2018-04-11 DIAGNOSIS — R609 Edema, unspecified: Secondary | ICD-10-CM | POA: Diagnosis not present

## 2018-04-11 DIAGNOSIS — G4733 Obstructive sleep apnea (adult) (pediatric): Secondary | ICD-10-CM | POA: Diagnosis not present

## 2018-04-14 DIAGNOSIS — R69 Illness, unspecified: Secondary | ICD-10-CM | POA: Diagnosis not present

## 2018-04-18 DIAGNOSIS — R7303 Prediabetes: Secondary | ICD-10-CM | POA: Diagnosis not present

## 2018-04-18 DIAGNOSIS — I10 Essential (primary) hypertension: Secondary | ICD-10-CM | POA: Diagnosis not present

## 2018-04-18 DIAGNOSIS — G894 Chronic pain syndrome: Secondary | ICD-10-CM | POA: Diagnosis not present

## 2018-04-18 DIAGNOSIS — R69 Illness, unspecified: Secondary | ICD-10-CM | POA: Diagnosis not present

## 2018-04-18 DIAGNOSIS — G4733 Obstructive sleep apnea (adult) (pediatric): Secondary | ICD-10-CM | POA: Diagnosis not present

## 2018-04-18 DIAGNOSIS — M6281 Muscle weakness (generalized): Secondary | ICD-10-CM | POA: Diagnosis not present

## 2018-04-18 DIAGNOSIS — B009 Herpesviral infection, unspecified: Secondary | ICD-10-CM | POA: Diagnosis not present

## 2018-04-18 DIAGNOSIS — M545 Low back pain: Secondary | ICD-10-CM | POA: Diagnosis not present

## 2018-04-18 DIAGNOSIS — E039 Hypothyroidism, unspecified: Secondary | ICD-10-CM | POA: Diagnosis not present

## 2018-04-18 DIAGNOSIS — R609 Edema, unspecified: Secondary | ICD-10-CM | POA: Diagnosis not present

## 2018-04-21 DIAGNOSIS — E039 Hypothyroidism, unspecified: Secondary | ICD-10-CM | POA: Diagnosis not present

## 2018-04-21 DIAGNOSIS — R7303 Prediabetes: Secondary | ICD-10-CM | POA: Diagnosis not present

## 2018-04-21 DIAGNOSIS — B009 Herpesviral infection, unspecified: Secondary | ICD-10-CM | POA: Diagnosis not present

## 2018-04-21 DIAGNOSIS — M545 Low back pain: Secondary | ICD-10-CM | POA: Diagnosis not present

## 2018-04-21 DIAGNOSIS — M6281 Muscle weakness (generalized): Secondary | ICD-10-CM | POA: Diagnosis not present

## 2018-04-21 DIAGNOSIS — R609 Edema, unspecified: Secondary | ICD-10-CM | POA: Diagnosis not present

## 2018-04-21 DIAGNOSIS — G4733 Obstructive sleep apnea (adult) (pediatric): Secondary | ICD-10-CM | POA: Diagnosis not present

## 2018-04-21 DIAGNOSIS — I10 Essential (primary) hypertension: Secondary | ICD-10-CM | POA: Diagnosis not present

## 2018-04-21 DIAGNOSIS — G894 Chronic pain syndrome: Secondary | ICD-10-CM | POA: Diagnosis not present

## 2018-04-21 DIAGNOSIS — R69 Illness, unspecified: Secondary | ICD-10-CM | POA: Diagnosis not present

## 2018-04-22 DIAGNOSIS — R609 Edema, unspecified: Secondary | ICD-10-CM | POA: Diagnosis not present

## 2018-04-22 DIAGNOSIS — G894 Chronic pain syndrome: Secondary | ICD-10-CM | POA: Diagnosis not present

## 2018-04-22 DIAGNOSIS — M545 Low back pain: Secondary | ICD-10-CM | POA: Diagnosis not present

## 2018-04-22 DIAGNOSIS — M6281 Muscle weakness (generalized): Secondary | ICD-10-CM | POA: Diagnosis not present

## 2018-04-22 DIAGNOSIS — R69 Illness, unspecified: Secondary | ICD-10-CM | POA: Diagnosis not present

## 2018-04-22 DIAGNOSIS — R7303 Prediabetes: Secondary | ICD-10-CM | POA: Diagnosis not present

## 2018-04-22 DIAGNOSIS — B009 Herpesviral infection, unspecified: Secondary | ICD-10-CM | POA: Diagnosis not present

## 2018-04-22 DIAGNOSIS — E039 Hypothyroidism, unspecified: Secondary | ICD-10-CM | POA: Diagnosis not present

## 2018-04-22 DIAGNOSIS — I10 Essential (primary) hypertension: Secondary | ICD-10-CM | POA: Diagnosis not present

## 2018-04-22 DIAGNOSIS — G4733 Obstructive sleep apnea (adult) (pediatric): Secondary | ICD-10-CM | POA: Diagnosis not present

## 2018-04-23 DIAGNOSIS — R609 Edema, unspecified: Secondary | ICD-10-CM | POA: Diagnosis not present

## 2018-04-23 DIAGNOSIS — G894 Chronic pain syndrome: Secondary | ICD-10-CM | POA: Diagnosis not present

## 2018-04-23 DIAGNOSIS — R69 Illness, unspecified: Secondary | ICD-10-CM | POA: Diagnosis not present

## 2018-04-23 DIAGNOSIS — R7303 Prediabetes: Secondary | ICD-10-CM | POA: Diagnosis not present

## 2018-04-23 DIAGNOSIS — B009 Herpesviral infection, unspecified: Secondary | ICD-10-CM | POA: Diagnosis not present

## 2018-04-23 DIAGNOSIS — I10 Essential (primary) hypertension: Secondary | ICD-10-CM | POA: Diagnosis not present

## 2018-04-23 DIAGNOSIS — G4733 Obstructive sleep apnea (adult) (pediatric): Secondary | ICD-10-CM | POA: Diagnosis not present

## 2018-04-23 DIAGNOSIS — M545 Low back pain: Secondary | ICD-10-CM | POA: Diagnosis not present

## 2018-04-23 DIAGNOSIS — E039 Hypothyroidism, unspecified: Secondary | ICD-10-CM | POA: Diagnosis not present

## 2018-04-23 DIAGNOSIS — M6281 Muscle weakness (generalized): Secondary | ICD-10-CM | POA: Diagnosis not present

## 2018-04-24 DIAGNOSIS — M6281 Muscle weakness (generalized): Secondary | ICD-10-CM | POA: Diagnosis not present

## 2018-04-24 DIAGNOSIS — R69 Illness, unspecified: Secondary | ICD-10-CM | POA: Diagnosis not present

## 2018-04-24 DIAGNOSIS — E039 Hypothyroidism, unspecified: Secondary | ICD-10-CM | POA: Diagnosis not present

## 2018-04-24 DIAGNOSIS — M545 Low back pain: Secondary | ICD-10-CM | POA: Diagnosis not present

## 2018-04-24 DIAGNOSIS — R7303 Prediabetes: Secondary | ICD-10-CM | POA: Diagnosis not present

## 2018-04-24 DIAGNOSIS — G894 Chronic pain syndrome: Secondary | ICD-10-CM | POA: Diagnosis not present

## 2018-04-24 DIAGNOSIS — G4733 Obstructive sleep apnea (adult) (pediatric): Secondary | ICD-10-CM | POA: Diagnosis not present

## 2018-04-24 DIAGNOSIS — B009 Herpesviral infection, unspecified: Secondary | ICD-10-CM | POA: Diagnosis not present

## 2018-04-24 DIAGNOSIS — R609 Edema, unspecified: Secondary | ICD-10-CM | POA: Diagnosis not present

## 2018-04-24 DIAGNOSIS — I10 Essential (primary) hypertension: Secondary | ICD-10-CM | POA: Diagnosis not present

## 2018-04-25 DIAGNOSIS — E039 Hypothyroidism, unspecified: Secondary | ICD-10-CM | POA: Diagnosis not present

## 2018-04-25 DIAGNOSIS — I10 Essential (primary) hypertension: Secondary | ICD-10-CM | POA: Diagnosis not present

## 2018-04-25 DIAGNOSIS — B009 Herpesviral infection, unspecified: Secondary | ICD-10-CM | POA: Diagnosis not present

## 2018-04-25 DIAGNOSIS — R609 Edema, unspecified: Secondary | ICD-10-CM | POA: Diagnosis not present

## 2018-04-25 DIAGNOSIS — R69 Illness, unspecified: Secondary | ICD-10-CM | POA: Diagnosis not present

## 2018-04-25 DIAGNOSIS — R7303 Prediabetes: Secondary | ICD-10-CM | POA: Diagnosis not present

## 2018-04-25 DIAGNOSIS — G894 Chronic pain syndrome: Secondary | ICD-10-CM | POA: Diagnosis not present

## 2018-04-25 DIAGNOSIS — M545 Low back pain: Secondary | ICD-10-CM | POA: Diagnosis not present

## 2018-04-25 DIAGNOSIS — M6281 Muscle weakness (generalized): Secondary | ICD-10-CM | POA: Diagnosis not present

## 2018-04-25 DIAGNOSIS — G4733 Obstructive sleep apnea (adult) (pediatric): Secondary | ICD-10-CM | POA: Diagnosis not present

## 2018-04-29 DIAGNOSIS — G894 Chronic pain syndrome: Secondary | ICD-10-CM | POA: Diagnosis not present

## 2018-04-29 DIAGNOSIS — R7303 Prediabetes: Secondary | ICD-10-CM | POA: Diagnosis not present

## 2018-04-29 DIAGNOSIS — M545 Low back pain: Secondary | ICD-10-CM | POA: Diagnosis not present

## 2018-04-29 DIAGNOSIS — R609 Edema, unspecified: Secondary | ICD-10-CM | POA: Diagnosis not present

## 2018-04-29 DIAGNOSIS — G4733 Obstructive sleep apnea (adult) (pediatric): Secondary | ICD-10-CM | POA: Diagnosis not present

## 2018-04-29 DIAGNOSIS — R69 Illness, unspecified: Secondary | ICD-10-CM | POA: Diagnosis not present

## 2018-04-29 DIAGNOSIS — M6281 Muscle weakness (generalized): Secondary | ICD-10-CM | POA: Diagnosis not present

## 2018-04-29 DIAGNOSIS — E039 Hypothyroidism, unspecified: Secondary | ICD-10-CM | POA: Diagnosis not present

## 2018-04-29 DIAGNOSIS — I10 Essential (primary) hypertension: Secondary | ICD-10-CM | POA: Diagnosis not present

## 2018-04-29 DIAGNOSIS — B009 Herpesviral infection, unspecified: Secondary | ICD-10-CM | POA: Diagnosis not present

## 2018-04-30 DIAGNOSIS — R609 Edema, unspecified: Secondary | ICD-10-CM | POA: Diagnosis not present

## 2018-04-30 DIAGNOSIS — M545 Low back pain: Secondary | ICD-10-CM | POA: Diagnosis not present

## 2018-04-30 DIAGNOSIS — M6281 Muscle weakness (generalized): Secondary | ICD-10-CM | POA: Diagnosis not present

## 2018-04-30 DIAGNOSIS — G4733 Obstructive sleep apnea (adult) (pediatric): Secondary | ICD-10-CM | POA: Diagnosis not present

## 2018-04-30 DIAGNOSIS — G894 Chronic pain syndrome: Secondary | ICD-10-CM | POA: Diagnosis not present

## 2018-04-30 DIAGNOSIS — B009 Herpesviral infection, unspecified: Secondary | ICD-10-CM | POA: Diagnosis not present

## 2018-04-30 DIAGNOSIS — R7303 Prediabetes: Secondary | ICD-10-CM | POA: Diagnosis not present

## 2018-04-30 DIAGNOSIS — R69 Illness, unspecified: Secondary | ICD-10-CM | POA: Diagnosis not present

## 2018-04-30 DIAGNOSIS — I10 Essential (primary) hypertension: Secondary | ICD-10-CM | POA: Diagnosis not present

## 2018-04-30 DIAGNOSIS — E039 Hypothyroidism, unspecified: Secondary | ICD-10-CM | POA: Diagnosis not present

## 2018-05-03 DIAGNOSIS — M797 Fibromyalgia: Secondary | ICD-10-CM | POA: Diagnosis not present

## 2018-05-05 DIAGNOSIS — M6281 Muscle weakness (generalized): Secondary | ICD-10-CM | POA: Diagnosis not present

## 2018-05-05 DIAGNOSIS — R609 Edema, unspecified: Secondary | ICD-10-CM | POA: Diagnosis not present

## 2018-05-05 DIAGNOSIS — I10 Essential (primary) hypertension: Secondary | ICD-10-CM | POA: Diagnosis not present

## 2018-05-05 DIAGNOSIS — E039 Hypothyroidism, unspecified: Secondary | ICD-10-CM | POA: Diagnosis not present

## 2018-05-05 DIAGNOSIS — M545 Low back pain: Secondary | ICD-10-CM | POA: Diagnosis not present

## 2018-05-05 DIAGNOSIS — G894 Chronic pain syndrome: Secondary | ICD-10-CM | POA: Diagnosis not present

## 2018-05-05 DIAGNOSIS — G4733 Obstructive sleep apnea (adult) (pediatric): Secondary | ICD-10-CM | POA: Diagnosis not present

## 2018-05-05 DIAGNOSIS — R69 Illness, unspecified: Secondary | ICD-10-CM | POA: Diagnosis not present

## 2018-05-05 DIAGNOSIS — B009 Herpesviral infection, unspecified: Secondary | ICD-10-CM | POA: Diagnosis not present

## 2018-05-05 DIAGNOSIS — R7303 Prediabetes: Secondary | ICD-10-CM | POA: Diagnosis not present

## 2018-05-07 DIAGNOSIS — G4733 Obstructive sleep apnea (adult) (pediatric): Secondary | ICD-10-CM | POA: Diagnosis not present

## 2018-05-07 DIAGNOSIS — R69 Illness, unspecified: Secondary | ICD-10-CM | POA: Diagnosis not present

## 2018-05-07 DIAGNOSIS — I10 Essential (primary) hypertension: Secondary | ICD-10-CM | POA: Diagnosis not present

## 2018-05-07 DIAGNOSIS — R7303 Prediabetes: Secondary | ICD-10-CM | POA: Diagnosis not present

## 2018-05-07 DIAGNOSIS — R609 Edema, unspecified: Secondary | ICD-10-CM | POA: Diagnosis not present

## 2018-05-07 DIAGNOSIS — M6281 Muscle weakness (generalized): Secondary | ICD-10-CM | POA: Diagnosis not present

## 2018-05-07 DIAGNOSIS — B009 Herpesviral infection, unspecified: Secondary | ICD-10-CM | POA: Diagnosis not present

## 2018-05-07 DIAGNOSIS — G894 Chronic pain syndrome: Secondary | ICD-10-CM | POA: Diagnosis not present

## 2018-05-07 DIAGNOSIS — E039 Hypothyroidism, unspecified: Secondary | ICD-10-CM | POA: Diagnosis not present

## 2018-05-07 DIAGNOSIS — M545 Low back pain: Secondary | ICD-10-CM | POA: Diagnosis not present

## 2018-05-09 DIAGNOSIS — G4733 Obstructive sleep apnea (adult) (pediatric): Secondary | ICD-10-CM | POA: Diagnosis not present

## 2018-05-09 DIAGNOSIS — G894 Chronic pain syndrome: Secondary | ICD-10-CM | POA: Diagnosis not present

## 2018-05-09 DIAGNOSIS — R609 Edema, unspecified: Secondary | ICD-10-CM | POA: Diagnosis not present

## 2018-05-09 DIAGNOSIS — I10 Essential (primary) hypertension: Secondary | ICD-10-CM | POA: Diagnosis not present

## 2018-05-09 DIAGNOSIS — R7303 Prediabetes: Secondary | ICD-10-CM | POA: Diagnosis not present

## 2018-05-09 DIAGNOSIS — B009 Herpesviral infection, unspecified: Secondary | ICD-10-CM | POA: Diagnosis not present

## 2018-05-09 DIAGNOSIS — M6281 Muscle weakness (generalized): Secondary | ICD-10-CM | POA: Diagnosis not present

## 2018-05-09 DIAGNOSIS — R69 Illness, unspecified: Secondary | ICD-10-CM | POA: Diagnosis not present

## 2018-05-09 DIAGNOSIS — E039 Hypothyroidism, unspecified: Secondary | ICD-10-CM | POA: Diagnosis not present

## 2018-05-09 DIAGNOSIS — M545 Low back pain: Secondary | ICD-10-CM | POA: Diagnosis not present

## 2018-05-12 DIAGNOSIS — I10 Essential (primary) hypertension: Secondary | ICD-10-CM | POA: Diagnosis not present

## 2018-05-12 DIAGNOSIS — R609 Edema, unspecified: Secondary | ICD-10-CM | POA: Diagnosis not present

## 2018-05-12 DIAGNOSIS — B009 Herpesviral infection, unspecified: Secondary | ICD-10-CM | POA: Diagnosis not present

## 2018-05-12 DIAGNOSIS — G894 Chronic pain syndrome: Secondary | ICD-10-CM | POA: Diagnosis not present

## 2018-05-12 DIAGNOSIS — M545 Low back pain: Secondary | ICD-10-CM | POA: Diagnosis not present

## 2018-05-12 DIAGNOSIS — R69 Illness, unspecified: Secondary | ICD-10-CM | POA: Diagnosis not present

## 2018-05-12 DIAGNOSIS — G4733 Obstructive sleep apnea (adult) (pediatric): Secondary | ICD-10-CM | POA: Diagnosis not present

## 2018-05-12 DIAGNOSIS — M6281 Muscle weakness (generalized): Secondary | ICD-10-CM | POA: Diagnosis not present

## 2018-05-12 DIAGNOSIS — E039 Hypothyroidism, unspecified: Secondary | ICD-10-CM | POA: Diagnosis not present

## 2018-05-12 DIAGNOSIS — R7303 Prediabetes: Secondary | ICD-10-CM | POA: Diagnosis not present

## 2018-05-13 DIAGNOSIS — M545 Low back pain: Secondary | ICD-10-CM | POA: Diagnosis not present

## 2018-05-13 DIAGNOSIS — B009 Herpesviral infection, unspecified: Secondary | ICD-10-CM | POA: Diagnosis not present

## 2018-05-13 DIAGNOSIS — M5030 Other cervical disc degeneration, unspecified cervical region: Secondary | ICD-10-CM | POA: Diagnosis not present

## 2018-05-13 DIAGNOSIS — I10 Essential (primary) hypertension: Secondary | ICD-10-CM | POA: Diagnosis not present

## 2018-05-13 DIAGNOSIS — G4733 Obstructive sleep apnea (adult) (pediatric): Secondary | ICD-10-CM | POA: Diagnosis not present

## 2018-05-13 DIAGNOSIS — G894 Chronic pain syndrome: Secondary | ICD-10-CM | POA: Diagnosis not present

## 2018-05-13 DIAGNOSIS — N898 Other specified noninflammatory disorders of vagina: Secondary | ICD-10-CM | POA: Diagnosis not present

## 2018-05-13 DIAGNOSIS — R609 Edema, unspecified: Secondary | ICD-10-CM | POA: Diagnosis not present

## 2018-05-13 DIAGNOSIS — N83201 Unspecified ovarian cyst, right side: Secondary | ICD-10-CM | POA: Diagnosis not present

## 2018-05-13 DIAGNOSIS — E039 Hypothyroidism, unspecified: Secondary | ICD-10-CM | POA: Diagnosis not present

## 2018-05-13 DIAGNOSIS — M797 Fibromyalgia: Secondary | ICD-10-CM | POA: Diagnosis not present

## 2018-05-13 DIAGNOSIS — R471 Dysarthria and anarthria: Secondary | ICD-10-CM | POA: Diagnosis not present

## 2018-05-13 DIAGNOSIS — Z23 Encounter for immunization: Secondary | ICD-10-CM | POA: Diagnosis not present

## 2018-05-13 DIAGNOSIS — R7303 Prediabetes: Secondary | ICD-10-CM | POA: Diagnosis not present

## 2018-05-13 DIAGNOSIS — R69 Illness, unspecified: Secondary | ICD-10-CM | POA: Diagnosis not present

## 2018-05-13 DIAGNOSIS — M6281 Muscle weakness (generalized): Secondary | ICD-10-CM | POA: Diagnosis not present

## 2018-05-13 DIAGNOSIS — F0781 Postconcussional syndrome: Secondary | ICD-10-CM | POA: Diagnosis not present

## 2018-05-14 DIAGNOSIS — R609 Edema, unspecified: Secondary | ICD-10-CM | POA: Diagnosis not present

## 2018-05-14 DIAGNOSIS — E039 Hypothyroidism, unspecified: Secondary | ICD-10-CM | POA: Diagnosis not present

## 2018-05-14 DIAGNOSIS — R7303 Prediabetes: Secondary | ICD-10-CM | POA: Diagnosis not present

## 2018-05-14 DIAGNOSIS — G894 Chronic pain syndrome: Secondary | ICD-10-CM | POA: Diagnosis not present

## 2018-05-14 DIAGNOSIS — R69 Illness, unspecified: Secondary | ICD-10-CM | POA: Diagnosis not present

## 2018-05-14 DIAGNOSIS — M545 Low back pain: Secondary | ICD-10-CM | POA: Diagnosis not present

## 2018-05-14 DIAGNOSIS — B009 Herpesviral infection, unspecified: Secondary | ICD-10-CM | POA: Diagnosis not present

## 2018-05-14 DIAGNOSIS — M6281 Muscle weakness (generalized): Secondary | ICD-10-CM | POA: Diagnosis not present

## 2018-05-14 DIAGNOSIS — G4733 Obstructive sleep apnea (adult) (pediatric): Secondary | ICD-10-CM | POA: Diagnosis not present

## 2018-05-14 DIAGNOSIS — I10 Essential (primary) hypertension: Secondary | ICD-10-CM | POA: Diagnosis not present

## 2018-05-15 DIAGNOSIS — G894 Chronic pain syndrome: Secondary | ICD-10-CM | POA: Diagnosis not present

## 2018-05-15 DIAGNOSIS — G4733 Obstructive sleep apnea (adult) (pediatric): Secondary | ICD-10-CM | POA: Diagnosis not present

## 2018-05-15 DIAGNOSIS — M6281 Muscle weakness (generalized): Secondary | ICD-10-CM | POA: Diagnosis not present

## 2018-05-15 DIAGNOSIS — R609 Edema, unspecified: Secondary | ICD-10-CM | POA: Diagnosis not present

## 2018-05-15 DIAGNOSIS — M545 Low back pain: Secondary | ICD-10-CM | POA: Diagnosis not present

## 2018-05-15 DIAGNOSIS — R69 Illness, unspecified: Secondary | ICD-10-CM | POA: Diagnosis not present

## 2018-05-15 DIAGNOSIS — B009 Herpesviral infection, unspecified: Secondary | ICD-10-CM | POA: Diagnosis not present

## 2018-05-15 DIAGNOSIS — I10 Essential (primary) hypertension: Secondary | ICD-10-CM | POA: Diagnosis not present

## 2018-05-15 DIAGNOSIS — R7303 Prediabetes: Secondary | ICD-10-CM | POA: Diagnosis not present

## 2018-05-15 DIAGNOSIS — E039 Hypothyroidism, unspecified: Secondary | ICD-10-CM | POA: Diagnosis not present

## 2018-05-16 DIAGNOSIS — M545 Low back pain: Secondary | ICD-10-CM | POA: Diagnosis not present

## 2018-05-16 DIAGNOSIS — M6281 Muscle weakness (generalized): Secondary | ICD-10-CM | POA: Diagnosis not present

## 2018-05-16 DIAGNOSIS — R7303 Prediabetes: Secondary | ICD-10-CM | POA: Diagnosis not present

## 2018-05-16 DIAGNOSIS — E039 Hypothyroidism, unspecified: Secondary | ICD-10-CM | POA: Diagnosis not present

## 2018-05-16 DIAGNOSIS — G4733 Obstructive sleep apnea (adult) (pediatric): Secondary | ICD-10-CM | POA: Diagnosis not present

## 2018-05-16 DIAGNOSIS — B009 Herpesviral infection, unspecified: Secondary | ICD-10-CM | POA: Diagnosis not present

## 2018-05-16 DIAGNOSIS — R69 Illness, unspecified: Secondary | ICD-10-CM | POA: Diagnosis not present

## 2018-05-16 DIAGNOSIS — G894 Chronic pain syndrome: Secondary | ICD-10-CM | POA: Diagnosis not present

## 2018-05-16 DIAGNOSIS — I10 Essential (primary) hypertension: Secondary | ICD-10-CM | POA: Diagnosis not present

## 2018-05-16 DIAGNOSIS — R609 Edema, unspecified: Secondary | ICD-10-CM | POA: Diagnosis not present

## 2018-05-19 ENCOUNTER — Encounter

## 2018-05-19 ENCOUNTER — Ambulatory Visit: Payer: Medicare HMO | Admitting: Neurology

## 2018-05-20 DIAGNOSIS — E039 Hypothyroidism, unspecified: Secondary | ICD-10-CM | POA: Diagnosis not present

## 2018-05-20 DIAGNOSIS — G4733 Obstructive sleep apnea (adult) (pediatric): Secondary | ICD-10-CM | POA: Diagnosis not present

## 2018-05-20 DIAGNOSIS — R69 Illness, unspecified: Secondary | ICD-10-CM | POA: Diagnosis not present

## 2018-05-20 DIAGNOSIS — I10 Essential (primary) hypertension: Secondary | ICD-10-CM | POA: Diagnosis not present

## 2018-05-20 DIAGNOSIS — G894 Chronic pain syndrome: Secondary | ICD-10-CM | POA: Diagnosis not present

## 2018-05-20 DIAGNOSIS — R7303 Prediabetes: Secondary | ICD-10-CM | POA: Diagnosis not present

## 2018-05-20 DIAGNOSIS — B009 Herpesviral infection, unspecified: Secondary | ICD-10-CM | POA: Diagnosis not present

## 2018-05-20 DIAGNOSIS — R609 Edema, unspecified: Secondary | ICD-10-CM | POA: Diagnosis not present

## 2018-05-20 DIAGNOSIS — M545 Low back pain: Secondary | ICD-10-CM | POA: Diagnosis not present

## 2018-05-20 DIAGNOSIS — M6281 Muscle weakness (generalized): Secondary | ICD-10-CM | POA: Diagnosis not present

## 2018-05-21 DIAGNOSIS — I10 Essential (primary) hypertension: Secondary | ICD-10-CM | POA: Diagnosis not present

## 2018-05-21 DIAGNOSIS — G4733 Obstructive sleep apnea (adult) (pediatric): Secondary | ICD-10-CM | POA: Diagnosis not present

## 2018-05-21 DIAGNOSIS — B009 Herpesviral infection, unspecified: Secondary | ICD-10-CM | POA: Diagnosis not present

## 2018-05-21 DIAGNOSIS — M6281 Muscle weakness (generalized): Secondary | ICD-10-CM | POA: Diagnosis not present

## 2018-05-21 DIAGNOSIS — R69 Illness, unspecified: Secondary | ICD-10-CM | POA: Diagnosis not present

## 2018-05-21 DIAGNOSIS — E039 Hypothyroidism, unspecified: Secondary | ICD-10-CM | POA: Diagnosis not present

## 2018-05-21 DIAGNOSIS — R7303 Prediabetes: Secondary | ICD-10-CM | POA: Diagnosis not present

## 2018-05-21 DIAGNOSIS — M545 Low back pain: Secondary | ICD-10-CM | POA: Diagnosis not present

## 2018-05-21 DIAGNOSIS — G894 Chronic pain syndrome: Secondary | ICD-10-CM | POA: Diagnosis not present

## 2018-05-21 DIAGNOSIS — R609 Edema, unspecified: Secondary | ICD-10-CM | POA: Diagnosis not present

## 2018-05-27 DIAGNOSIS — G894 Chronic pain syndrome: Secondary | ICD-10-CM | POA: Diagnosis not present

## 2018-05-27 DIAGNOSIS — I10 Essential (primary) hypertension: Secondary | ICD-10-CM | POA: Diagnosis not present

## 2018-05-27 DIAGNOSIS — B009 Herpesviral infection, unspecified: Secondary | ICD-10-CM | POA: Diagnosis not present

## 2018-05-27 DIAGNOSIS — G4733 Obstructive sleep apnea (adult) (pediatric): Secondary | ICD-10-CM | POA: Diagnosis not present

## 2018-05-27 DIAGNOSIS — E039 Hypothyroidism, unspecified: Secondary | ICD-10-CM | POA: Diagnosis not present

## 2018-05-27 DIAGNOSIS — R609 Edema, unspecified: Secondary | ICD-10-CM | POA: Diagnosis not present

## 2018-05-27 DIAGNOSIS — R7303 Prediabetes: Secondary | ICD-10-CM | POA: Diagnosis not present

## 2018-05-27 DIAGNOSIS — M6281 Muscle weakness (generalized): Secondary | ICD-10-CM | POA: Diagnosis not present

## 2018-05-27 DIAGNOSIS — R69 Illness, unspecified: Secondary | ICD-10-CM | POA: Diagnosis not present

## 2018-05-27 DIAGNOSIS — M545 Low back pain: Secondary | ICD-10-CM | POA: Diagnosis not present

## 2018-05-28 DIAGNOSIS — R7303 Prediabetes: Secondary | ICD-10-CM | POA: Diagnosis not present

## 2018-05-28 DIAGNOSIS — G4733 Obstructive sleep apnea (adult) (pediatric): Secondary | ICD-10-CM | POA: Diagnosis not present

## 2018-05-28 DIAGNOSIS — E039 Hypothyroidism, unspecified: Secondary | ICD-10-CM | POA: Diagnosis not present

## 2018-05-28 DIAGNOSIS — R69 Illness, unspecified: Secondary | ICD-10-CM | POA: Diagnosis not present

## 2018-05-28 DIAGNOSIS — B009 Herpesviral infection, unspecified: Secondary | ICD-10-CM | POA: Diagnosis not present

## 2018-05-28 DIAGNOSIS — M545 Low back pain: Secondary | ICD-10-CM | POA: Diagnosis not present

## 2018-05-28 DIAGNOSIS — G894 Chronic pain syndrome: Secondary | ICD-10-CM | POA: Diagnosis not present

## 2018-05-28 DIAGNOSIS — R609 Edema, unspecified: Secondary | ICD-10-CM | POA: Diagnosis not present

## 2018-05-28 DIAGNOSIS — M6281 Muscle weakness (generalized): Secondary | ICD-10-CM | POA: Diagnosis not present

## 2018-05-28 DIAGNOSIS — I10 Essential (primary) hypertension: Secondary | ICD-10-CM | POA: Diagnosis not present

## 2018-05-29 DIAGNOSIS — R69 Illness, unspecified: Secondary | ICD-10-CM | POA: Diagnosis not present

## 2018-05-29 DIAGNOSIS — I10 Essential (primary) hypertension: Secondary | ICD-10-CM | POA: Diagnosis not present

## 2018-05-29 DIAGNOSIS — M6281 Muscle weakness (generalized): Secondary | ICD-10-CM | POA: Diagnosis not present

## 2018-05-29 DIAGNOSIS — M545 Low back pain: Secondary | ICD-10-CM | POA: Diagnosis not present

## 2018-05-29 DIAGNOSIS — G894 Chronic pain syndrome: Secondary | ICD-10-CM | POA: Diagnosis not present

## 2018-05-29 DIAGNOSIS — R7303 Prediabetes: Secondary | ICD-10-CM | POA: Diagnosis not present

## 2018-05-29 DIAGNOSIS — G4733 Obstructive sleep apnea (adult) (pediatric): Secondary | ICD-10-CM | POA: Diagnosis not present

## 2018-05-29 DIAGNOSIS — B009 Herpesviral infection, unspecified: Secondary | ICD-10-CM | POA: Diagnosis not present

## 2018-05-29 DIAGNOSIS — E039 Hypothyroidism, unspecified: Secondary | ICD-10-CM | POA: Diagnosis not present

## 2018-05-29 DIAGNOSIS — R609 Edema, unspecified: Secondary | ICD-10-CM | POA: Diagnosis not present

## 2018-06-02 DIAGNOSIS — M797 Fibromyalgia: Secondary | ICD-10-CM | POA: Diagnosis not present

## 2018-06-03 ENCOUNTER — Institutional Professional Consult (permissible substitution): Payer: Medicare HMO | Admitting: Neurology

## 2018-06-03 ENCOUNTER — Telehealth: Payer: Self-pay

## 2018-06-03 NOTE — Telephone Encounter (Signed)
Pt did not show for their appt with Dr. Athar today.  

## 2018-06-04 ENCOUNTER — Encounter: Payer: Self-pay | Admitting: Neurology

## 2018-06-04 DIAGNOSIS — B009 Herpesviral infection, unspecified: Secondary | ICD-10-CM | POA: Diagnosis not present

## 2018-06-04 DIAGNOSIS — G894 Chronic pain syndrome: Secondary | ICD-10-CM | POA: Diagnosis not present

## 2018-06-04 DIAGNOSIS — E039 Hypothyroidism, unspecified: Secondary | ICD-10-CM | POA: Diagnosis not present

## 2018-06-04 DIAGNOSIS — I10 Essential (primary) hypertension: Secondary | ICD-10-CM | POA: Diagnosis not present

## 2018-06-04 DIAGNOSIS — M545 Low back pain: Secondary | ICD-10-CM | POA: Diagnosis not present

## 2018-06-04 DIAGNOSIS — R7303 Prediabetes: Secondary | ICD-10-CM | POA: Diagnosis not present

## 2018-06-04 DIAGNOSIS — M6281 Muscle weakness (generalized): Secondary | ICD-10-CM | POA: Diagnosis not present

## 2018-06-04 DIAGNOSIS — G4733 Obstructive sleep apnea (adult) (pediatric): Secondary | ICD-10-CM | POA: Diagnosis not present

## 2018-06-04 DIAGNOSIS — R69 Illness, unspecified: Secondary | ICD-10-CM | POA: Diagnosis not present

## 2018-06-04 DIAGNOSIS — R609 Edema, unspecified: Secondary | ICD-10-CM | POA: Diagnosis not present

## 2018-06-05 DIAGNOSIS — M6281 Muscle weakness (generalized): Secondary | ICD-10-CM | POA: Diagnosis not present

## 2018-06-05 DIAGNOSIS — E039 Hypothyroidism, unspecified: Secondary | ICD-10-CM | POA: Diagnosis not present

## 2018-06-05 DIAGNOSIS — B009 Herpesviral infection, unspecified: Secondary | ICD-10-CM | POA: Diagnosis not present

## 2018-06-05 DIAGNOSIS — I10 Essential (primary) hypertension: Secondary | ICD-10-CM | POA: Diagnosis not present

## 2018-06-05 DIAGNOSIS — R7303 Prediabetes: Secondary | ICD-10-CM | POA: Diagnosis not present

## 2018-06-05 DIAGNOSIS — R69 Illness, unspecified: Secondary | ICD-10-CM | POA: Diagnosis not present

## 2018-06-05 DIAGNOSIS — M545 Low back pain: Secondary | ICD-10-CM | POA: Diagnosis not present

## 2018-06-05 DIAGNOSIS — G4733 Obstructive sleep apnea (adult) (pediatric): Secondary | ICD-10-CM | POA: Diagnosis not present

## 2018-06-05 DIAGNOSIS — R609 Edema, unspecified: Secondary | ICD-10-CM | POA: Diagnosis not present

## 2018-06-05 DIAGNOSIS — G894 Chronic pain syndrome: Secondary | ICD-10-CM | POA: Diagnosis not present

## 2018-07-03 DIAGNOSIS — M797 Fibromyalgia: Secondary | ICD-10-CM | POA: Diagnosis not present

## 2018-08-04 ENCOUNTER — Other Ambulatory Visit: Payer: Self-pay | Admitting: Family Medicine

## 2018-08-04 DIAGNOSIS — M797 Fibromyalgia: Secondary | ICD-10-CM

## 2018-08-04 NOTE — Telephone Encounter (Signed)
Copied from CRM [id number]. Topic: Quick Communication - Rx Refill/Question >> Aug 04, 2018 12:45 PM Jens Som A wrote: Medication: meloxicam (MOBIC) 15 MG tablet [[id number]] New customer-no way to escribe  Has the patient contacted their pharmcy? Yes  (Agent: If no, request that the patient contact the pharmacy for the refill.) (Agent: If yes, when and what did the pharmacy advise?)  Preferred Pharmacy (with phone number or street name): [facility] [address] [place], [place] [address] [phone]  Agent: Please be advised that RX refills may take up to 3 business days. We ask that you follow-up with your pharmacy.

## 2018-08-26 DIAGNOSIS — R69 Illness, unspecified: Secondary | ICD-10-CM | POA: Diagnosis not present

## 2018-08-26 DIAGNOSIS — E039 Hypothyroidism, unspecified: Secondary | ICD-10-CM | POA: Diagnosis not present

## 2018-08-26 DIAGNOSIS — G4730 Sleep apnea, unspecified: Secondary | ICD-10-CM | POA: Diagnosis not present

## 2018-08-26 DIAGNOSIS — Z008 Encounter for other general examination: Secondary | ICD-10-CM | POA: Diagnosis not present

## 2018-08-26 DIAGNOSIS — J449 Chronic obstructive pulmonary disease, unspecified: Secondary | ICD-10-CM | POA: Diagnosis not present

## 2018-08-26 DIAGNOSIS — G43109 Migraine with aura, not intractable, without status migrainosus: Secondary | ICD-10-CM | POA: Diagnosis not present

## 2018-08-26 DIAGNOSIS — B009 Herpesviral infection, unspecified: Secondary | ICD-10-CM | POA: Diagnosis not present

## 2018-08-26 DIAGNOSIS — G4700 Insomnia, unspecified: Secondary | ICD-10-CM | POA: Diagnosis not present

## 2018-08-26 DIAGNOSIS — I739 Peripheral vascular disease, unspecified: Secondary | ICD-10-CM | POA: Diagnosis not present

## 2018-08-29 DIAGNOSIS — I10 Essential (primary) hypertension: Secondary | ICD-10-CM | POA: Diagnosis not present

## 2018-08-29 DIAGNOSIS — E039 Hypothyroidism, unspecified: Secondary | ICD-10-CM | POA: Diagnosis not present

## 2018-08-29 DIAGNOSIS — Z6841 Body Mass Index (BMI) 40.0 and over, adult: Secondary | ICD-10-CM | POA: Diagnosis not present

## 2018-08-29 DIAGNOSIS — R7303 Prediabetes: Secondary | ICD-10-CM | POA: Diagnosis not present

## 2018-08-29 DIAGNOSIS — R6889 Other general symptoms and signs: Secondary | ICD-10-CM | POA: Diagnosis not present

## 2018-08-29 DIAGNOSIS — M549 Dorsalgia, unspecified: Secondary | ICD-10-CM | POA: Diagnosis not present

## 2018-09-12 DIAGNOSIS — R69 Illness, unspecified: Secondary | ICD-10-CM | POA: Diagnosis not present

## 2018-09-20 DIAGNOSIS — R69 Illness, unspecified: Secondary | ICD-10-CM | POA: Diagnosis not present

## 2018-10-27 DIAGNOSIS — H1013 Acute atopic conjunctivitis, bilateral: Secondary | ICD-10-CM | POA: Diagnosis not present

## 2018-10-27 DIAGNOSIS — Z79899 Other long term (current) drug therapy: Secondary | ICD-10-CM | POA: Diagnosis not present

## 2018-10-27 DIAGNOSIS — M5030 Other cervical disc degeneration, unspecified cervical region: Secondary | ICD-10-CM | POA: Diagnosis not present

## 2018-10-27 DIAGNOSIS — R062 Wheezing: Secondary | ICD-10-CM | POA: Diagnosis not present

## 2018-12-18 DIAGNOSIS — R300 Dysuria: Secondary | ICD-10-CM | POA: Diagnosis not present

## 2018-12-19 DIAGNOSIS — Z20828 Contact with and (suspected) exposure to other viral communicable diseases: Secondary | ICD-10-CM | POA: Diagnosis not present

## 2018-12-24 DIAGNOSIS — F332 Major depressive disorder, recurrent severe without psychotic features: Secondary | ICD-10-CM | POA: Diagnosis not present

## 2018-12-24 DIAGNOSIS — R300 Dysuria: Secondary | ICD-10-CM | POA: Diagnosis not present

## 2018-12-24 DIAGNOSIS — Z20828 Contact with and (suspected) exposure to other viral communicable diseases: Secondary | ICD-10-CM | POA: Diagnosis not present

## 2018-12-24 DIAGNOSIS — R69 Illness, unspecified: Secondary | ICD-10-CM | POA: Diagnosis not present

## 2018-12-24 DIAGNOSIS — G894 Chronic pain syndrome: Secondary | ICD-10-CM | POA: Diagnosis not present

## 2018-12-30 ENCOUNTER — Encounter (HOSPITAL_COMMUNITY): Payer: Self-pay | Admitting: Emergency Medicine

## 2018-12-30 ENCOUNTER — Emergency Department (HOSPITAL_COMMUNITY): Payer: Medicare HMO

## 2018-12-30 ENCOUNTER — Emergency Department (HOSPITAL_COMMUNITY)
Admission: EM | Admit: 2018-12-30 | Discharge: 2018-12-31 | Disposition: A | Discharge status: Home / Self Care | Payer: Medicare HMO | Attending: Emergency Medicine | Admitting: Emergency Medicine

## 2018-12-30 DIAGNOSIS — R350 Frequency of micturition: Secondary | ICD-10-CM | POA: Diagnosis present

## 2018-12-30 DIAGNOSIS — R109 Unspecified abdominal pain: Secondary | ICD-10-CM | POA: Diagnosis not present

## 2018-12-30 DIAGNOSIS — R69 Illness, unspecified: Secondary | ICD-10-CM | POA: Diagnosis not present

## 2018-12-30 DIAGNOSIS — Z79899 Other long term (current) drug therapy: Secondary | ICD-10-CM | POA: Diagnosis not present

## 2018-12-30 DIAGNOSIS — B20 Human immunodeficiency virus [HIV] disease: Secondary | ICD-10-CM | POA: Diagnosis not present

## 2018-12-30 DIAGNOSIS — R635 Abnormal weight gain: Secondary | ICD-10-CM | POA: Diagnosis not present

## 2018-12-30 DIAGNOSIS — I10 Essential (primary) hypertension: Secondary | ICD-10-CM | POA: Diagnosis not present

## 2018-12-30 DIAGNOSIS — N3000 Acute cystitis without hematuria: Secondary | ICD-10-CM | POA: Insufficient documentation

## 2018-12-30 DIAGNOSIS — R300 Dysuria: Secondary | ICD-10-CM | POA: Diagnosis not present

## 2018-12-30 DIAGNOSIS — E039 Hypothyroidism, unspecified: Secondary | ICD-10-CM | POA: Diagnosis not present

## 2018-12-30 DIAGNOSIS — R34 Anuria and oliguria: Secondary | ICD-10-CM | POA: Diagnosis not present

## 2018-12-30 DIAGNOSIS — R509 Fever, unspecified: Secondary | ICD-10-CM | POA: Diagnosis not present

## 2018-12-30 DIAGNOSIS — Z8673 Personal history of transient ischemic attack (TIA), and cerebral infarction without residual deficits: Secondary | ICD-10-CM | POA: Diagnosis not present

## 2018-12-30 DIAGNOSIS — B028 Zoster with other complications: Secondary | ICD-10-CM | POA: Diagnosis not present

## 2018-12-30 DIAGNOSIS — F17210 Nicotine dependence, cigarettes, uncomplicated: Secondary | ICD-10-CM | POA: Insufficient documentation

## 2018-12-30 DIAGNOSIS — J45909 Unspecified asthma, uncomplicated: Secondary | ICD-10-CM | POA: Diagnosis not present

## 2018-12-30 DIAGNOSIS — I959 Hypotension, unspecified: Secondary | ICD-10-CM | POA: Diagnosis not present

## 2018-12-30 LAB — CBC
HCT: 42 % (ref 36.0–46.0)
Hemoglobin: 13.5 g/dL (ref 12.0–15.0)
MCH: 27.1 pg (ref 26.0–34.0)
MCHC: 32.1 g/dL (ref 30.0–36.0)
MCV: 84.2 fL (ref 80.0–100.0)
Platelets: 386 10*3/uL (ref 150–400)
RBC: 4.99 MIL/uL (ref 3.87–5.11)
RDW: 13.5 % (ref 11.5–15.5)
WBC: 10 10*3/uL (ref 4.0–10.5)
nRBC: 0 % (ref 0.0–0.2)

## 2018-12-30 LAB — BASIC METABOLIC PANEL
Anion gap: 9 (ref 5–15)
BUN: 8 mg/dL (ref 6–20)
CO2: 27 mmol/L (ref 22–32)
Calcium: 9.2 mg/dL (ref 8.9–10.3)
Chloride: 105 mmol/L (ref 98–111)
Creatinine, Ser: 1.01 mg/dL — ABNORMAL HIGH (ref 0.44–1.00)
GFR calc Af Amer: >60 mL/min (ref >60)
GFR calc non Af Amer: >60 mL/min (ref >60)
Glucose, Bld: 104 mg/dL — ABNORMAL HIGH (ref 70–99)
Potassium: 3.3 mmol/L — ABNORMAL LOW (ref 3.5–5.1)
Sodium: 141 mmol/L (ref 135–145)

## 2018-12-30 LAB — URINALYSIS, ROUTINE W REFLEX MICROSCOPIC
Bilirubin Urine: NEGATIVE
Glucose, UA: NEGATIVE mg/dL
Hgb urine dipstick: NEGATIVE
Ketones, ur: NEGATIVE mg/dL
Leukocytes,Ua: NEGATIVE
Nitrite: NEGATIVE
Protein, ur: 100 mg/dL — AB
Specific Gravity, Urine: 1.028 (ref 1.005–1.030)
pH: 5 (ref 5.0–8.0)

## 2018-12-30 MED ORDER — OXYCODONE-ACETAMINOPHEN 5-325 MG PO TABS
1.0000 | ORAL_TABLET | Freq: Once | ORAL | Status: AC
  Administered 2018-12-30: 1 via ORAL
  Filled 2018-12-30: qty 1

## 2018-12-30 MED ORDER — KETOROLAC TROMETHAMINE 30 MG/ML IJ SOLN
30.0000 mg | Freq: Once | INTRAMUSCULAR | Status: AC
  Administered 2018-12-30: 30 mg via INTRAMUSCULAR
  Filled 2018-12-30: qty 1

## 2018-12-30 NOTE — ED Provider Notes (Signed)
9:14 PM BP (!) 148/94 (BP Location: Left Wrist)   Pulse 88   Temp 98.8 F (37.1 C) (Oral)   Resp 18   SpO2 92%  Patient needs UA C/O retaining urine.  90 in bladder earlier on bladder scan    Patient urinated and UA positive for infection. Given Fosfomycin and return precautions. F/U with PCP.   Margarita Mail, PA-C 12/31/18 [id number]    Davonna Belling, MD 12/31/18 [phone]

## 2018-12-30 NOTE — ED Notes (Signed)
Patient transported to CT 

## 2018-12-30 NOTE — ED Provider Notes (Signed)
[facility]   CSN: [id number] Arrival date & time: 12/30/18  1603    History   Chief Complaint Chief Complaint  Patient presents with  . Fever  . Urinary Frequency    HPI Brianna Roberts is a 53 y.o. female.     HPI   53 year old female with the past medical history of anxiety, depression, fibromyalgia, HIV presents today with numerous complaints.  Patient notes that approximate 2 weeks ago she developed a cough and fever.  She had covert testing which was negative.  She felt this was her allergies.  She notes the symptoms have gone away except for she has daily fevers.  She notes that takes aspirin daily which resolves her fevers.  She also notes she has been having dysuria, malodorous urine, and right groin pain.  Patient notes this feels similar to previous kidney stones.  She notes she has been in constant contact with her primary care provider.  Patient notes she also developed shingles to her right back and was placed on increased dose of acyclovir.  She notes that the symptoms she is having today are the right groin pain, she notes she has not been able to urinate since last night.  She feels like if she could urinate she would feel better.  She notes she has been drinking water.  No fever presently.   Past Medical History:  Diagnosis Date  . Allergy   . Anxiety   . Arthritis   . Asthma   . 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 Concussion   . DDD (degenerative disc disease), cervical   . Depression   . Fibromyalgia   . HIV (human immunodeficiency virus infection) (HCC)   . Homelessness   . Hypertension   . Hypothyroidism   . Insomnia   . Neuromuscular disorder (HCC)   . Thyroid disease   . TIA (transient ischemic attack)     Patient Active Problem List   Diagnosis Date Noted  . Dysarthria 12/13/2017  . Headache 12/13/2017  . Chronic pain syndrome 03/17/2017  . Incomplete  emptying of bladder 02/22/2017  . Prediabetes 12/31/2016  . Post concussion syndrome 10/06/2016  . 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 Fibromyalgia 10/05/2016  . Benign essential HTN 10/05/2016  . Hypothyroidism 10/05/2016  . Allergic rhinitis 10/05/2016  . Chronic fatigue syndrome 10/05/2016  . Asthma, chronic 10/05/2016  . HSV (herpes simplex virus) anogenital infection 10/05/2016  . Anxiety and depression 10/05/2016  . Bilateral leg edema 10/05/2016  . Ovarian cyst 10/05/2016  . Nephrolithiasis 10/05/2016  . IBS (irritable bowel syndrome) 10/05/2016  . OSA (obstructive sleep apnea) 10/05/2016  . Left-sided weakness 10/05/2016  . Migraine 10/05/2016  . BMI 45.0-49.9, adult (HCC) 10/05/2016  . DDD (degenerative disc disease), cervical   . Insomnia     Past Surgical History:  Procedure Laterality Date  . ABDOMINAL HYSTERECTOMY     Partial-ovaries and tubes remain  . BREAST BIOPSY Left   . CHOLECYSTECTOMY  2006  . COLONOSCOPY  07/27/2015   [place] MD  . HERNIA REPAIR  1997  . TUMOR EXCISION Right 1984   Sinuses  . TUMOR EXCISION Left 2015   sinuses  . URACHAL CYST EXCISION  1997     OB History   No obstetric history on file.      Home Medications    Prior to Admission medications   Medication Sig Start Date  End Date Taking? Authorizing Provider  acetaminophen (TYLENOL) 325 MG tablet Take 650 mg by mouth every 6 (six) hours as needed (for pain).    [provider]  acyclovir (ZOVIRAX) 200 MG capsule Take 1 capsule (200 mg total) by mouth 2 (two) times daily. For Outbreak: 2 capsules (400 mg) TID x 5 days. Patient taking differently: Take 400 mg by mouth 2 (two) times daily.  11/01/17   Harrison Mons, PA  albuterol (PROVENTIL HFA;VENTOLIN HFA) 108 (90 Base) MCG/ACT inhaler Inhale 1-2 puffs into the lungs every 6 (six) hours as needed for wheezing or shortness of breath. 08/09/17   Harrison Mons, PA  amitriptyline (ELAVIL)  75 MG tablet Take 1 tablet (75 mg total) by mouth at bedtime. 08/09/17   Harrison Mons, PA  amLODipine (NORVASC) 10 MG tablet Take 1 tablet (10 mg total) by mouth daily. 08/21/17   Harrison Mons, PA  ARIPiprazole (ABILIFY) 10 MG tablet Take 1 tablet (10 mg total) by mouth daily. 10/05/16   Harrison Mons, PA  ASPERCREME LIDOCAINE EX Apply 1 application topically 2 (two) times daily as needed (for muscle pain).    [provider]  azelastine (ASTELIN) 0.1 % nasal spray USE 2 SPRAYS EACH NOSTRIL TWICE DAILY. 10/15/17   Harrison Mons, PA  baclofen (LIORESAL) 10 MG tablet TAKE 1 2 TO 1 (ONE HALF TO ONE) TABLET BY MOUTH AS NEEDED TO TWICE DAILY FOR HEADACHE. LIMIT TO 2 HEADACHE DAYS PER WEEK. AVOID DAILY USE. 01/27/18   [provider]  budesonide-formoterol (SYMBICORT) 160-4.5 MCG/ACT inhaler Inhale 2 puffs into the lungs 2 (two) times daily. 12/27/16   Harrison Mons, PA  diazepam (VALIUM) 5 MG tablet Take 1 tablet (5 mg total) by mouth every 8 (eight) hours as needed for anxiety. 10/14/17   Harrison Mons, PA  diphenhydrAMINE (BENADRYL) 25 mg capsule Take 25 mg by mouth 2 (two) times daily.    [provider]  fluticasone (FLONASE) 50 MCG/ACT nasal spray USE 1 SPRAY IN EACH NOSTRIL DAILY. 04/02/17   Harrison Mons, PA  furosemide (LASIX) 40 MG tablet Take 1 tablet (40 mg total) by mouth 2 (two) times daily. Patient taking differently: Take 80 mg by mouth at bedtime.  08/09/17   Harrison Mons, PA  glucose blood test strip Use as instructed 04/17/17   Harrison Mons, PA  hydrOXYzine (ATARAX/VISTARIL) 10 MG tablet Take 1 tablet (10 mg total) by mouth at bedtime. Patient taking differently: Take 10 mg by mouth at bedtime as needed (itching or angioedema).  10/05/16   Harrison Mons, PA  Incontinence Supply Disposable (PREVAIL WASHCLOTHS) MISC Use daily as directed 06/03/17   Harrison Mons, PA  ipratropium-albuterol (DUONEB) 0.5-2.5 (3) MG/3ML SOLN Take 3 mLs by nebulization  every 6 (six) hours as needed (for breathing). Patient taking differently: Take 3 mLs by nebulization every 6 (six) hours as needed (for shortness of breath or wheezing).  10/05/16   Harrison Mons, PA  Lancets Thin MISC Use to check home glucose daily 03/28/17   Harrison Mons, PA  levothyroxine (SYNTHROID, LEVOTHROID) 50 MCG tablet Take 1 tablet (50 mcg total) by mouth daily before breakfast. Patient taking differently: Take 50 mcg by mouth daily.  08/17/17   Harrison Mons, PA  losartan (COZAAR) 100 MG tablet Take 1 tablet (100 mg total) by mouth daily. 08/09/17   Harrison Mons, PA  meloxicam (MOBIC) 15 MG tablet TAKE 1 TABLET EACH DAY. 11/08/17   Harrison Mons, PA  methocarbamol (ROBAXIN) 500 MG tablet Take 2 tablets (  1,000 mg total) by mouth every 8 (eight) hours as needed for muscle spasms. 02/01/17   Loren RacerYelverton, David, MD  metoprolol tartrate (LOPRESSOR) 100 MG tablet Take 1 tablet (100 mg total) by mouth 2 (two) times daily. 08/09/17   Porfirio OarJeffery, Chelle, PA  Nerve Stimulator (PRO COMFORT TENS UNIT) DEVI 1 Device by Does not apply route daily as needed. 12/27/16   Porfirio OarJeffery, Chelle, PA  nystatin cream (MYCOSTATIN) Apply 1 application topically 2 (two) times daily.    [provider]  oxyCODONE (OXY IR/ROXICODONE) 5 MG immediate release tablet Take 1 tablet (5 mg total) by mouth every 4 (four) hours as needed for severe pain. 10/15/17   Porfirio OarJeffery, Chelle, PA  potassium chloride SA (K-DUR,KLOR-CON) 20 MEQ tablet Take 1 tablet (20 mEq total) by mouth 2 (two) times daily. 06/24/17   Porfirio OarJeffery, Chelle, PA  sertraline (ZOLOFT) 100 MG tablet Take 100 mg by mouth daily.    [provider]  traMADol (ULTRAM) 50 MG tablet Take 1 tablet (50 mg total) by mouth 3 (three) times daily as needed. Patient taking differently: Take 50 mg by mouth at bedtime.  10/14/17   Porfirio OarJeffery, Chelle, PA  triamcinolone ointment (KENALOG) 0.5 % Apply 1 application topically 2 (two) times daily.    [provider]   vitamin B-12 (CYANOCOBALAMIN) 100 MCG tablet Take 100 mcg by mouth daily.    [provider]  zonisamide (ZONEGRAN) 25 MG capsule TAKE 1 CAPSULE BY MOUTH AT BEDTIME FOR 7 DAYS THEN INCREASE BY 1 CAPSULE BY MOUTH WEEKLY UNTIL TAKING 4 CAPSULES BY MOUTH AT BEDTIME. 01/27/18   [provider]    Family History Family History  Problem Relation Age of Onset  . Diabetes Mother   . Heart disease Mother   . Hyperlipidemia Mother   . Hypertension Mother   . Stroke Mother   . Mental illness Mother   . Heart failure Mother   . Headache Mother        [name]  . Breast cancer Mother [id number]       diagnosed 3 times  . Heart disease Father   . Alcohol abuse Father   . Hypertension Sister   . Heart disease Brother   . Mental illness Brother   . COPD Brother   . Heart failure Brother   . Hypertension Sister   . Colon cancer Neg Hx     Social History Social History   Tobacco Use  . Smoking status: Current Every Day Smoker    Packs/day: 0.30    Years: 33.00    Pack years: 9.90    Types: Cigarettes  . Smokeless tobacco: Never Used  Substance Use Topics  . Alcohol use: No  . Drug use: No     Allergies   Iodinated diagnostic agents, Sulfa antibiotics, Codeine, Dilaudid [hydromorphone hcl], Penicillins, Spironolactone, Adhesive [tape], and Nickel   Review of Systems Review of Systems  All other systems reviewed and are negative.   Physical Exam Updated Vital Signs BP (!) 148/94 (BP Location: Left Wrist)   Pulse 88   Temp 98.8 F (37.1 C) (Oral)   Resp 18   SpO2 92%   Physical Exam Vitals signs and nursing note reviewed.  Constitutional:      Appearance: She is well-developed.     Comments: Morbidly obese  HENT:     Head: Normocephalic and atraumatic.  Eyes:     General: No scleral icterus.       Right eye: No discharge.  Left eye: No discharge.     Conjunctiva/sclera: Conjunctivae normal.     Pupils: Pupils are equal, round, and reactive  to light.  Neck:     Musculoskeletal: Normal range of motion.     Vascular: No JVD.     Trachea: No tracheal deviation.  Pulmonary:     Effort: Pulmonary effort is normal.     Breath sounds: No stridor.  Abdominal:     Comments: Tenderness palpation of right hip and groin, no significant abdominal tenderness difficult exam due to patient's body habitus  Skin:    Comments: No rashes noted  Neurological:     Mental Status: She is alert and oriented to person, place, and time.     Coordination: Coordination normal.  Psychiatric:        Behavior: Behavior normal.        Thought Content: Thought content normal.        Judgment: Judgment normal.      ED Treatments / Results  Labs (all labs ordered are listed, but only abnormal results are displayed) Labs Reviewed  BASIC METABOLIC PANEL - Abnormal; Notable for the following components:      Result Value   Potassium 3.3 (*)    Glucose, Bld 104 (*)    Creatinine, Ser 1.01 (*)    All other components within normal limits  URINALYSIS, ROUTINE W REFLEX MICROSCOPIC - Abnormal; Notable for the following components:   Color, Urine AMBER (*)    APPearance CLOUDY (*)    Protein, ur 100 (*)    Bacteria, UA MANY (*)    All other components within normal limits  CBC    EKG None  Radiology Ct Renal Stone Study  Result Date: 12/30/2018 CLINICAL DATA:  Right-sided flank pain for several weeks EXAM: CT ABDOMEN AND PELVIS WITHOUT CONTRAST TECHNIQUE: Multidetector CT imaging of the abdomen and pelvis was performed following the standard protocol without IV contrast. COMPARISON:  02/01/2017 FINDINGS: Lower chest: No acute abnormality. Hepatobiliary: No focal liver abnormality is seen. Status post cholecystectomy. No biliary dilatation. Pancreas: Unremarkable. No pancreatic ductal dilatation or surrounding inflammatory changes. Spleen: Normal in size without focal abnormality. Adrenals/Urinary Tract: Adrenal glands are within normal limits.  Kidneys are well visualized bilaterally. No renal calculi or obstructive changes are seen. The bladder is partially decompressed. Stomach/Bowel: The appendix is within normal limits. No obstructive or inflammatory changes of large or small bowel are seen. Vascular/Lymphatic: No significant vascular findings are present. No enlarged abdominal or pelvic lymph nodes. Reproductive: Status post hysterectomy. No adnexal masses. Other: No abdominal wall hernia or abnormality. No abdominopelvic ascites. Musculoskeletal: No acute or significant osseous findings. IMPRESSION: No acute abnormality noted. Electronically Signed   By: Alcide Clever M.D.   On: 12/30/2018 20:00    Procedures Procedures (including critical care time)  Medications Ordered in ED Medications  ketorolac (TORADOL) 30 MG/ML injection 30 mg (30 mg Intramuscular Given 12/30/18 1931)  oxyCODONE-acetaminophen (PERCOCET/ROXICET) 5-325 MG per tablet 1 tablet (1 tablet Oral Given 12/30/18 1931)  fosfomycin (MONUROL) packet 3 g (3 g Oral Given 12/31/18 0021)     Initial Impression / Assessment and Plan / ED Course  I have reviewed the triage vital signs and the nursing notes.  Pertinent labs & imaging results that were available during my care of the patient were reviewed by me and considered in my medical decision making (see chart for details).          Assessment/Plan: 52 year old female presents today with  several complaints.  She notes fever, she is afebrile with no tachycardia and well-appearing.  She has urinary complaints but notes she has been unable to urinate.  Her labs are reassuring with no significant elevation in creatinine.  She had a bladder ultrasound that showed approximately 90 mL's of fluid.  Patient will be given oral hydration here for a urinalysis.  She does have a history of kidney stones this feels somewhat similar she will have CT renal done.  Low suspicion for acute appendicitis or acute intra-abdominal pathology.   Patient CT is reassuring with no cute abnormalities.  Patient will need to give us a urine sample for further evaluation for urinary tract infection.  Patient care signed to oncoming provider pending urinalysis and disposition.  I do anticipate patient will be discharged as I have low suspicion for acute life-threatening etiology at this time.     Final Clinical Impressions(s) / ED Diagnoses   Final diagnoses:  Acute cystitis without hematuria    ED Discharge Orders    None       Rosalio LoudHedges, Ximena Todaro, PA-C 12/31/18 1340    Wynetta FinesMessick, Peter C, MD 01/04/19 1104

## 2018-12-30 NOTE — ED Triage Notes (Signed)
Pt reports low grade fever for the last 1-2 weeks with body aches- pt states she had a virtual visit last week and was tested negative for covid. Pt still feels like she cant empty her bladder. Pt also adds she was diagnosed with shingles last week and currently on medication and her rash has improved on her back.

## 2018-12-30 NOTE — ED Notes (Signed)
Pt ambulated to restroom.  Urine sample obtained and sent to lab along with urine culture.

## 2018-12-31 MED ORDER — FOSFOMYCIN TROMETHAMINE 3 G PO PACK
3.0000 g | PACK | Freq: Once | ORAL | Status: AC
  Administered 2018-12-31: 3 g via ORAL
  Filled 2018-12-31: qty 3

## 2018-12-31 NOTE — Discharge Instructions (Addendum)
Your work-up shows that you have a urinary tract infection. I have treated you here with a single dose of fosfomycin which is a powerful antibiotic.  You should not need any other antibiotics.  The remainder of your work-up is without abnormality.  Please see your primary care physician.  Return for the reasons listed below.  Otherwise see your primary care provider.  Get help right away if you have: Severe pain in your back or your lower abdomen. A fever. Nausea or vomiting.

## 2019-01-23 DIAGNOSIS — M545 Low back pain: Secondary | ICD-10-CM | POA: Diagnosis not present

## 2019-01-23 DIAGNOSIS — R7303 Prediabetes: Secondary | ICD-10-CM | POA: Diagnosis not present

## 2019-01-23 DIAGNOSIS — M549 Dorsalgia, unspecified: Secondary | ICD-10-CM | POA: Diagnosis not present

## 2019-01-23 DIAGNOSIS — R0600 Dyspnea, unspecified: Secondary | ICD-10-CM | POA: Diagnosis not present

## 2019-01-23 DIAGNOSIS — I10 Essential (primary) hypertension: Secondary | ICD-10-CM | POA: Diagnosis not present

## 2019-01-23 DIAGNOSIS — J309 Allergic rhinitis, unspecified: Secondary | ICD-10-CM | POA: Diagnosis not present

## 2019-01-23 DIAGNOSIS — E876 Hypokalemia: Secondary | ICD-10-CM | POA: Diagnosis not present

## 2019-01-23 DIAGNOSIS — R300 Dysuria: Secondary | ICD-10-CM | POA: Diagnosis not present

## 2019-01-23 DIAGNOSIS — M797 Fibromyalgia: Secondary | ICD-10-CM | POA: Diagnosis not present

## 2019-01-23 DIAGNOSIS — R1031 Right lower quadrant pain: Secondary | ICD-10-CM | POA: Diagnosis not present

## 2019-03-27 IMAGING — CT CT RENAL STONE PROTOCOL
2 of 4 series · 16 of 46 positions shown, 18 images · non-contrast
Comparison: None.

CLINICAL DATA: Right flank pain x1 month.  History of renal stones.

EXAM:
CT ABDOMEN AND PELVIS WITHOUT CONTRAST
TECHNIQUE: Multidetector CT imaging of the abdomen and pelvis was performed
following the standard protocol without IV contrast.

[Series 3: renal stone 5.0 · axial · 0.98mm/px · z∈[-494,-74]mm · 13 of 93 slices shown, 15 images]
[im 5/93  soft-tissue]
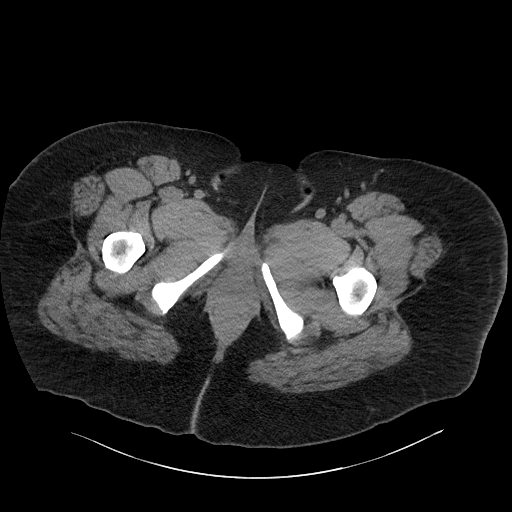
[im 5/93  bone]
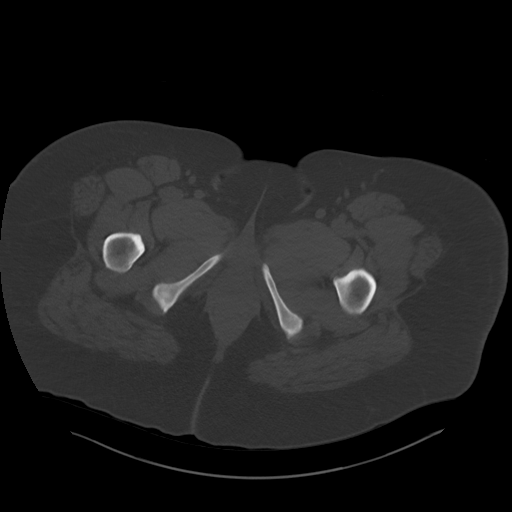
[im 13/93  soft-tissue]
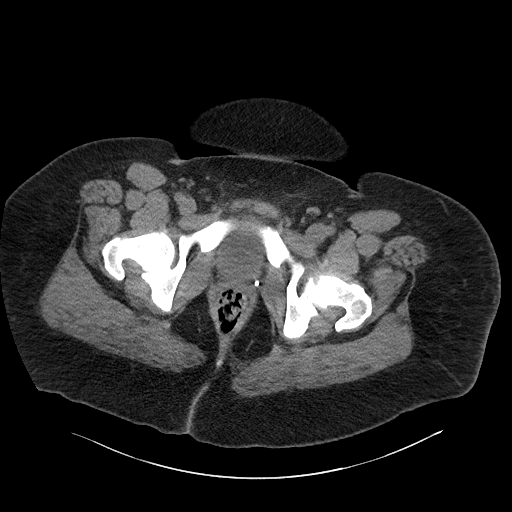
[im 21/93  soft-tissue]
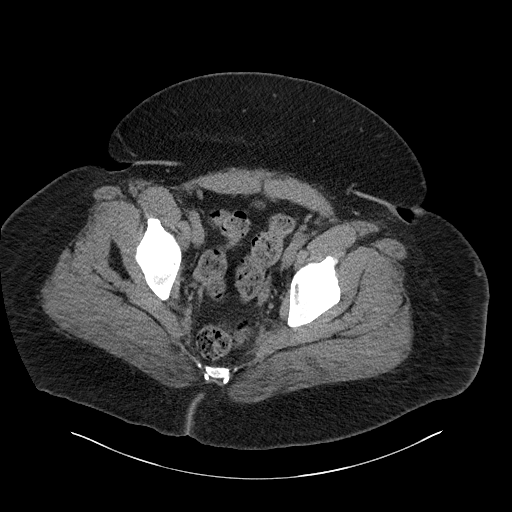
[im 25/93  soft-tissue]
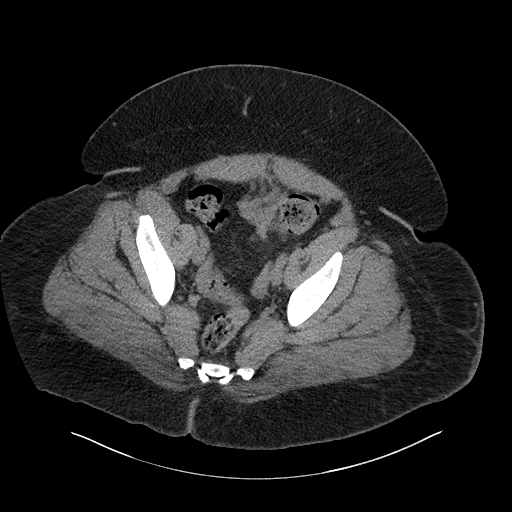
[im 33/93  soft-tissue]
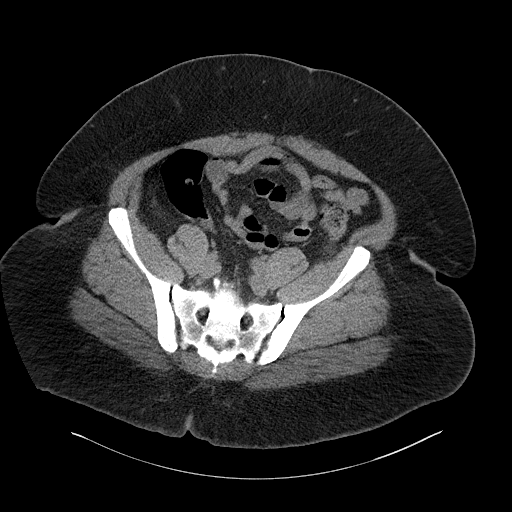
[im 41/93  soft-tissue]
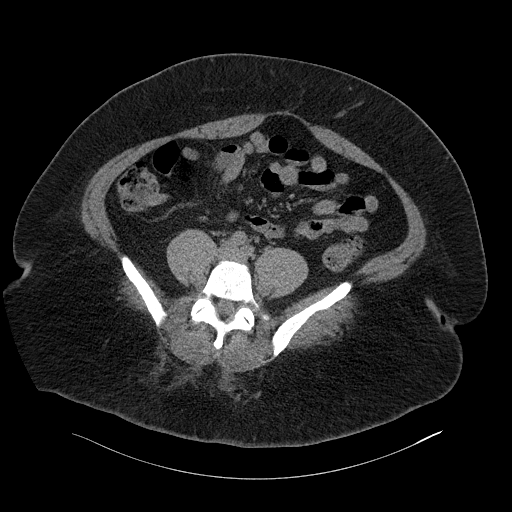
[im 49/93  soft-tissue]
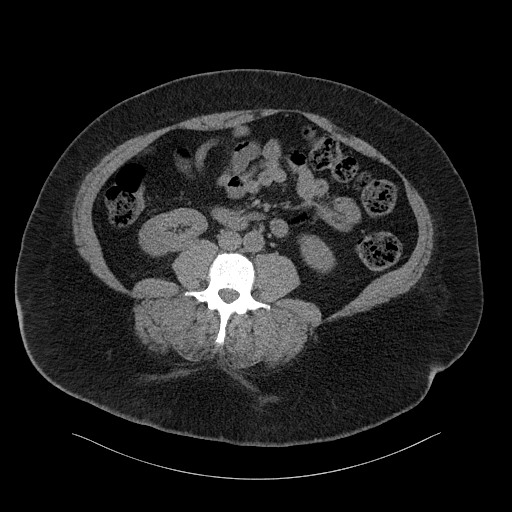
[im 53/93  soft-tissue]
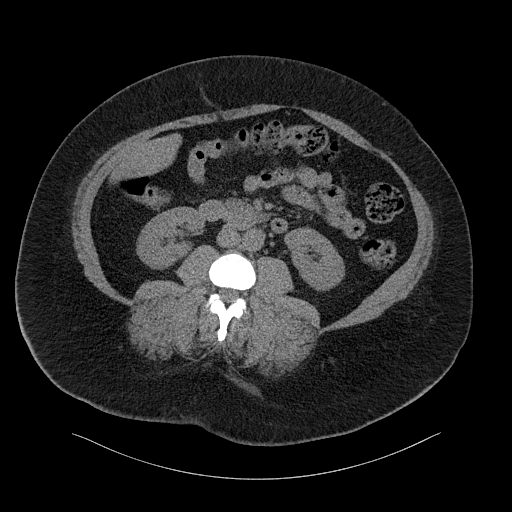
[im 61/93  soft-tissue]
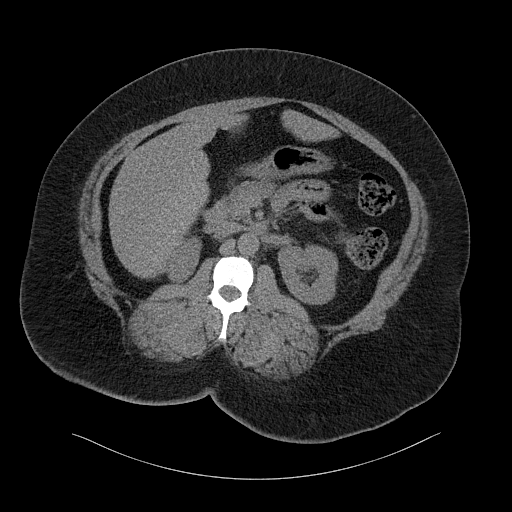
[im 61/93  bone]
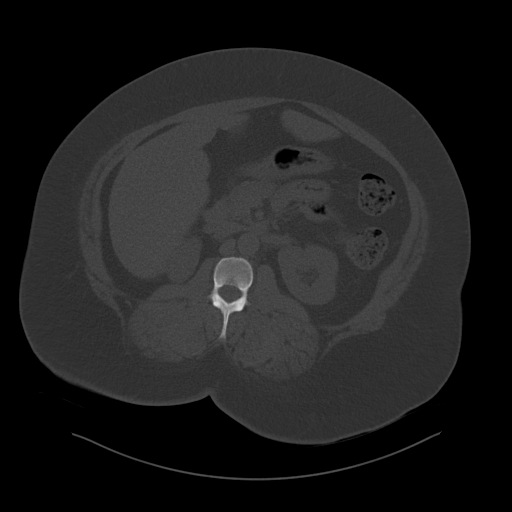
[im 69/93  soft-tissue]
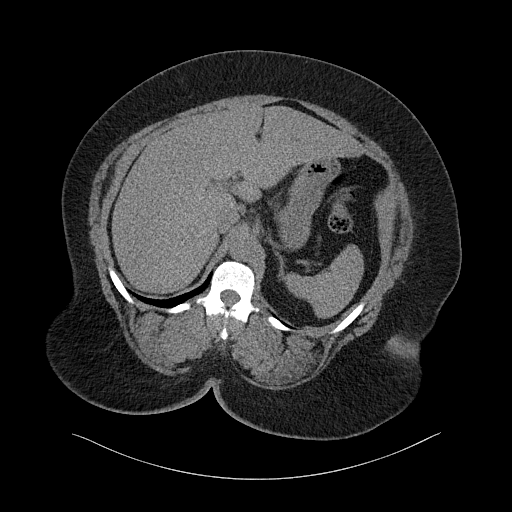
[im 73/93  soft-tissue]
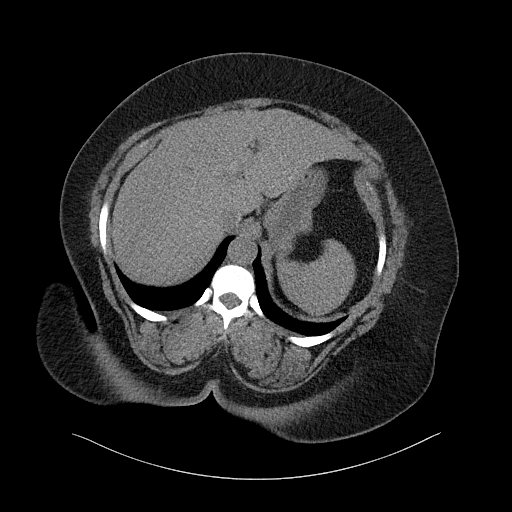
[im 81/93  soft-tissue]
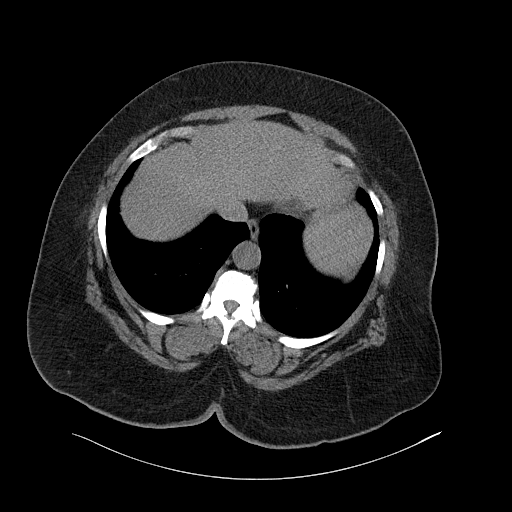
[im 89/93  soft-tissue]
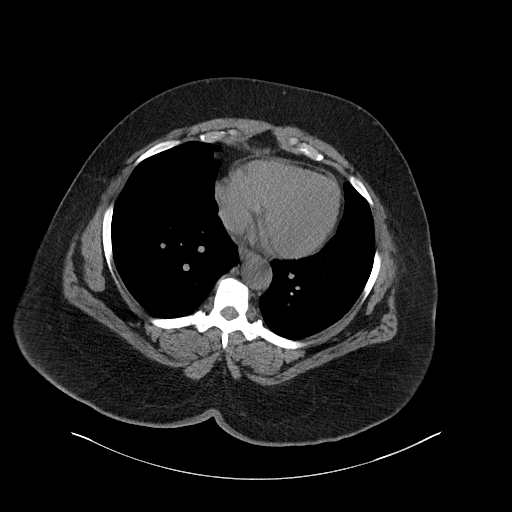

[Series 5: renal stone 3.0 cor · coronal · 0.91mm/px · 3 of 137 slices shown]
[im 46/137  soft-tissue]
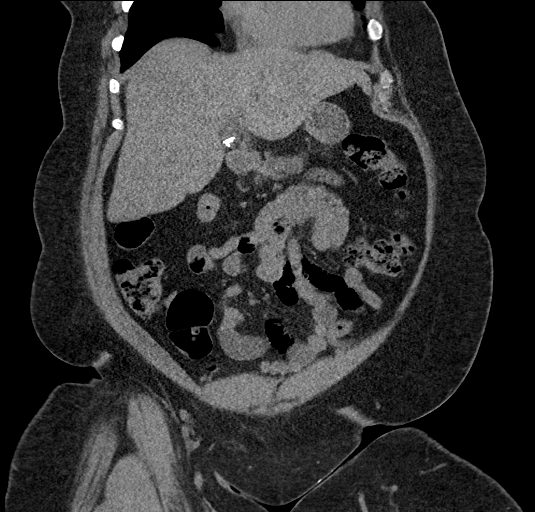
[im 61/137  soft-tissue]
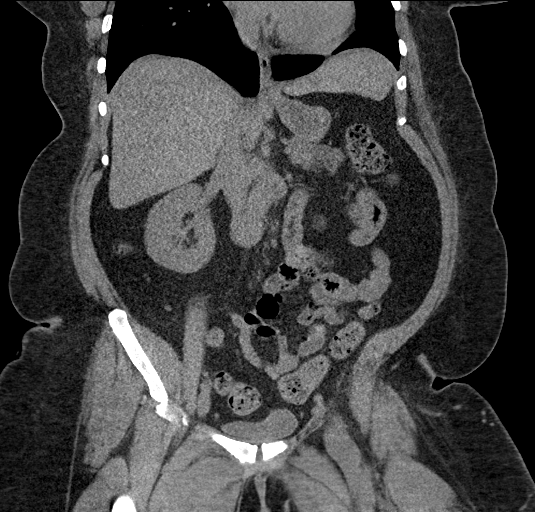
[im 76/137  soft-tissue]
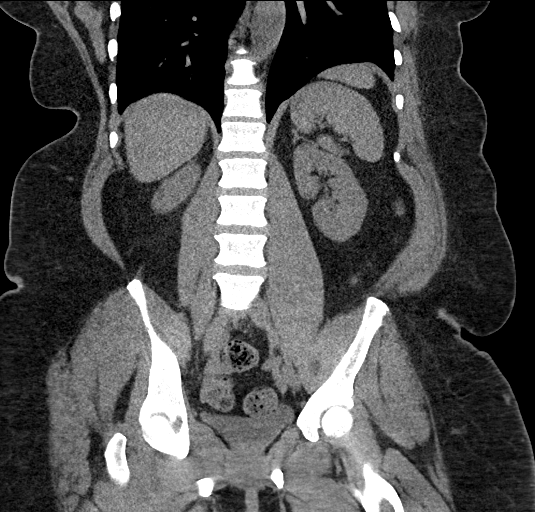

[16 of 46 positions shown; findings below may reference images not displayed]

FINDINGS: Lower chest: Normal visualized cardiac chamber size without
pericardial effusion. Clear lung bases.

Hepatobiliary: Fatty infiltration of the liver. Cholecystectomy. No
space-occupying mass of the liver noted. No biliary dilatation is
visualized.

Pancreas: Unremarkable. No pancreatic ductal dilatation or
surrounding inflammatory changes.

Spleen: Normal in size without focal abnormality.

Adrenals/Urinary Tract: Normal bilateral adrenal glands. No
hydroureteronephrosis on either side. No obstructing calculus. A few
punctate calcifications are suggested bilaterally without
obstruction. These may represent tiny renal calculi or vascular
calcifications. The urinary bladder is physiologically distended
without stones or mass.

Stomach/Bowel: Stomach is within normal limits. Appendix appears
normal. No evidence of bowel wall thickening, distention, or
inflammatory changes.

Vascular/Lymphatic: No significant vascular findings are present. No
enlarged abdominal or pelvic lymph nodes.

Reproductive: Status post hysterectomy. No adnexal masses.

Other: Pelvic calcifications consistent phleboliths are noted in the
lower pelvis, one just anterior to the region the urethra but
believed not to be within the urethra lack of any signs of
collecting system or bladder obstruction.

Musculoskeletal: No acute or significant osseous findings.
IMPRESSION: 1. No obstructive uropathy. Tiny punctate calcifications are
suggested within both kidneys which may represent vascular or
nonobstructing calcifications.
2. No acute bowel inflammation.
3. Cholecystectomy and hysterectomy.
4. Normal-appearing appendix.

## 2019-04-10 DIAGNOSIS — Z993 Dependence on wheelchair: Secondary | ICD-10-CM | POA: Diagnosis not present

## 2019-04-10 DIAGNOSIS — Z7951 Long term (current) use of inhaled steroids: Secondary | ICD-10-CM | POA: Diagnosis not present

## 2019-04-10 DIAGNOSIS — Z7982 Long term (current) use of aspirin: Secondary | ICD-10-CM | POA: Diagnosis not present

## 2019-04-10 DIAGNOSIS — M797 Fibromyalgia: Secondary | ICD-10-CM | POA: Diagnosis not present

## 2019-04-10 DIAGNOSIS — R7301 Impaired fasting glucose: Secondary | ICD-10-CM | POA: Diagnosis not present

## 2019-04-10 DIAGNOSIS — M5441 Lumbago with sciatica, right side: Secondary | ICD-10-CM | POA: Diagnosis not present

## 2019-04-10 DIAGNOSIS — J309 Allergic rhinitis, unspecified: Secondary | ICD-10-CM | POA: Diagnosis not present

## 2019-04-10 DIAGNOSIS — Z87440 Personal history of urinary (tract) infections: Secondary | ICD-10-CM | POA: Diagnosis not present

## 2019-04-10 DIAGNOSIS — Z6841 Body Mass Index (BMI) 40.0 and over, adult: Secondary | ICD-10-CM | POA: Diagnosis not present

## 2019-04-10 DIAGNOSIS — G894 Chronic pain syndrome: Secondary | ICD-10-CM | POA: Diagnosis not present

## 2019-04-15 DIAGNOSIS — Z7982 Long term (current) use of aspirin: Secondary | ICD-10-CM | POA: Diagnosis not present

## 2019-04-15 DIAGNOSIS — Z87440 Personal history of urinary (tract) infections: Secondary | ICD-10-CM | POA: Diagnosis not present

## 2019-04-15 DIAGNOSIS — Z993 Dependence on wheelchair: Secondary | ICD-10-CM | POA: Diagnosis not present

## 2019-04-15 DIAGNOSIS — J309 Allergic rhinitis, unspecified: Secondary | ICD-10-CM | POA: Diagnosis not present

## 2019-04-15 DIAGNOSIS — Z6841 Body Mass Index (BMI) 40.0 and over, adult: Secondary | ICD-10-CM | POA: Diagnosis not present

## 2019-04-15 DIAGNOSIS — R7301 Impaired fasting glucose: Secondary | ICD-10-CM | POA: Diagnosis not present

## 2019-04-15 DIAGNOSIS — Z7951 Long term (current) use of inhaled steroids: Secondary | ICD-10-CM | POA: Diagnosis not present

## 2019-04-15 DIAGNOSIS — M5441 Lumbago with sciatica, right side: Secondary | ICD-10-CM | POA: Diagnosis not present

## 2019-04-15 DIAGNOSIS — M797 Fibromyalgia: Secondary | ICD-10-CM | POA: Diagnosis not present

## 2019-04-17 DIAGNOSIS — Z87440 Personal history of urinary (tract) infections: Secondary | ICD-10-CM | POA: Diagnosis not present

## 2019-04-17 DIAGNOSIS — Z7982 Long term (current) use of aspirin: Secondary | ICD-10-CM | POA: Diagnosis not present

## 2019-04-17 DIAGNOSIS — Z6841 Body Mass Index (BMI) 40.0 and over, adult: Secondary | ICD-10-CM | POA: Diagnosis not present

## 2019-04-17 DIAGNOSIS — J309 Allergic rhinitis, unspecified: Secondary | ICD-10-CM | POA: Diagnosis not present

## 2019-04-17 DIAGNOSIS — Z993 Dependence on wheelchair: Secondary | ICD-10-CM | POA: Diagnosis not present

## 2019-04-17 DIAGNOSIS — M797 Fibromyalgia: Secondary | ICD-10-CM | POA: Diagnosis not present

## 2019-04-17 DIAGNOSIS — M5441 Lumbago with sciatica, right side: Secondary | ICD-10-CM | POA: Diagnosis not present

## 2019-04-17 DIAGNOSIS — Z7951 Long term (current) use of inhaled steroids: Secondary | ICD-10-CM | POA: Diagnosis not present

## 2019-04-17 DIAGNOSIS — R7301 Impaired fasting glucose: Secondary | ICD-10-CM | POA: Diagnosis not present

## 2019-05-04 DIAGNOSIS — Z6841 Body Mass Index (BMI) 40.0 and over, adult: Secondary | ICD-10-CM | POA: Diagnosis not present

## 2019-05-04 DIAGNOSIS — R7301 Impaired fasting glucose: Secondary | ICD-10-CM | POA: Diagnosis not present

## 2019-05-04 DIAGNOSIS — M797 Fibromyalgia: Secondary | ICD-10-CM | POA: Diagnosis not present

## 2019-05-04 DIAGNOSIS — Z87440 Personal history of urinary (tract) infections: Secondary | ICD-10-CM | POA: Diagnosis not present

## 2019-05-04 DIAGNOSIS — M5441 Lumbago with sciatica, right side: Secondary | ICD-10-CM | POA: Diagnosis not present

## 2019-05-04 DIAGNOSIS — Z7982 Long term (current) use of aspirin: Secondary | ICD-10-CM | POA: Diagnosis not present

## 2019-05-04 DIAGNOSIS — J309 Allergic rhinitis, unspecified: Secondary | ICD-10-CM | POA: Diagnosis not present

## 2019-05-04 DIAGNOSIS — Z993 Dependence on wheelchair: Secondary | ICD-10-CM | POA: Diagnosis not present

## 2019-05-04 DIAGNOSIS — Z7951 Long term (current) use of inhaled steroids: Secondary | ICD-10-CM | POA: Diagnosis not present

## 2019-05-05 DIAGNOSIS — M5441 Lumbago with sciatica, right side: Secondary | ICD-10-CM | POA: Diagnosis not present

## 2019-05-05 DIAGNOSIS — Z87440 Personal history of urinary (tract) infections: Secondary | ICD-10-CM | POA: Diagnosis not present

## 2019-05-05 DIAGNOSIS — R7301 Impaired fasting glucose: Secondary | ICD-10-CM | POA: Diagnosis not present

## 2019-05-05 DIAGNOSIS — Z993 Dependence on wheelchair: Secondary | ICD-10-CM | POA: Diagnosis not present

## 2019-05-05 DIAGNOSIS — Z7982 Long term (current) use of aspirin: Secondary | ICD-10-CM | POA: Diagnosis not present

## 2019-05-05 DIAGNOSIS — M797 Fibromyalgia: Secondary | ICD-10-CM | POA: Diagnosis not present

## 2019-05-05 DIAGNOSIS — Z7951 Long term (current) use of inhaled steroids: Secondary | ICD-10-CM | POA: Diagnosis not present

## 2019-05-05 DIAGNOSIS — J309 Allergic rhinitis, unspecified: Secondary | ICD-10-CM | POA: Diagnosis not present

## 2019-05-05 DIAGNOSIS — Z6841 Body Mass Index (BMI) 40.0 and over, adult: Secondary | ICD-10-CM | POA: Diagnosis not present

## 2019-05-07 DIAGNOSIS — Z87440 Personal history of urinary (tract) infections: Secondary | ICD-10-CM | POA: Diagnosis not present

## 2019-05-07 DIAGNOSIS — Z6841 Body Mass Index (BMI) 40.0 and over, adult: Secondary | ICD-10-CM | POA: Diagnosis not present

## 2019-05-07 DIAGNOSIS — M5441 Lumbago with sciatica, right side: Secondary | ICD-10-CM | POA: Diagnosis not present

## 2019-05-07 DIAGNOSIS — Z7982 Long term (current) use of aspirin: Secondary | ICD-10-CM | POA: Diagnosis not present

## 2019-05-07 DIAGNOSIS — Z993 Dependence on wheelchair: Secondary | ICD-10-CM | POA: Diagnosis not present

## 2019-05-07 DIAGNOSIS — M797 Fibromyalgia: Secondary | ICD-10-CM | POA: Diagnosis not present

## 2019-05-07 DIAGNOSIS — Z7951 Long term (current) use of inhaled steroids: Secondary | ICD-10-CM | POA: Diagnosis not present

## 2019-05-07 DIAGNOSIS — R7301 Impaired fasting glucose: Secondary | ICD-10-CM | POA: Diagnosis not present

## 2019-05-07 DIAGNOSIS — J309 Allergic rhinitis, unspecified: Secondary | ICD-10-CM | POA: Diagnosis not present

## 2019-05-12 DIAGNOSIS — Z6841 Body Mass Index (BMI) 40.0 and over, adult: Secondary | ICD-10-CM | POA: Diagnosis not present

## 2019-05-12 DIAGNOSIS — M5441 Lumbago with sciatica, right side: Secondary | ICD-10-CM | POA: Diagnosis not present

## 2019-05-12 DIAGNOSIS — Z7951 Long term (current) use of inhaled steroids: Secondary | ICD-10-CM | POA: Diagnosis not present

## 2019-05-12 DIAGNOSIS — J309 Allergic rhinitis, unspecified: Secondary | ICD-10-CM | POA: Diagnosis not present

## 2019-05-12 DIAGNOSIS — Z87440 Personal history of urinary (tract) infections: Secondary | ICD-10-CM | POA: Diagnosis not present

## 2019-05-12 DIAGNOSIS — R7301 Impaired fasting glucose: Secondary | ICD-10-CM | POA: Diagnosis not present

## 2019-05-12 DIAGNOSIS — Z7982 Long term (current) use of aspirin: Secondary | ICD-10-CM | POA: Diagnosis not present

## 2019-05-12 DIAGNOSIS — Z993 Dependence on wheelchair: Secondary | ICD-10-CM | POA: Diagnosis not present

## 2019-05-12 DIAGNOSIS — M797 Fibromyalgia: Secondary | ICD-10-CM | POA: Diagnosis not present

## 2019-05-13 DIAGNOSIS — Z7951 Long term (current) use of inhaled steroids: Secondary | ICD-10-CM | POA: Diagnosis not present

## 2019-05-13 DIAGNOSIS — Z7982 Long term (current) use of aspirin: Secondary | ICD-10-CM | POA: Diagnosis not present

## 2019-05-13 DIAGNOSIS — Z993 Dependence on wheelchair: Secondary | ICD-10-CM | POA: Diagnosis not present

## 2019-05-13 DIAGNOSIS — J309 Allergic rhinitis, unspecified: Secondary | ICD-10-CM | POA: Diagnosis not present

## 2019-05-13 DIAGNOSIS — M797 Fibromyalgia: Secondary | ICD-10-CM | POA: Diagnosis not present

## 2019-05-13 DIAGNOSIS — Z6841 Body Mass Index (BMI) 40.0 and over, adult: Secondary | ICD-10-CM | POA: Diagnosis not present

## 2019-05-13 DIAGNOSIS — Z87440 Personal history of urinary (tract) infections: Secondary | ICD-10-CM | POA: Diagnosis not present

## 2019-05-13 DIAGNOSIS — R7301 Impaired fasting glucose: Secondary | ICD-10-CM | POA: Diagnosis not present

## 2019-05-13 DIAGNOSIS — M5441 Lumbago with sciatica, right side: Secondary | ICD-10-CM | POA: Diagnosis not present

## 2019-05-14 DIAGNOSIS — M797 Fibromyalgia: Secondary | ICD-10-CM | POA: Diagnosis not present

## 2019-05-14 DIAGNOSIS — Z7951 Long term (current) use of inhaled steroids: Secondary | ICD-10-CM | POA: Diagnosis not present

## 2019-05-14 DIAGNOSIS — R7301 Impaired fasting glucose: Secondary | ICD-10-CM | POA: Diagnosis not present

## 2019-05-14 DIAGNOSIS — M5441 Lumbago with sciatica, right side: Secondary | ICD-10-CM | POA: Diagnosis not present

## 2019-05-14 DIAGNOSIS — Z87440 Personal history of urinary (tract) infections: Secondary | ICD-10-CM | POA: Diagnosis not present

## 2019-05-14 DIAGNOSIS — Z6841 Body Mass Index (BMI) 40.0 and over, adult: Secondary | ICD-10-CM | POA: Diagnosis not present

## 2019-05-14 DIAGNOSIS — Z7982 Long term (current) use of aspirin: Secondary | ICD-10-CM | POA: Diagnosis not present

## 2019-05-14 DIAGNOSIS — Z993 Dependence on wheelchair: Secondary | ICD-10-CM | POA: Diagnosis not present

## 2019-05-14 DIAGNOSIS — J309 Allergic rhinitis, unspecified: Secondary | ICD-10-CM | POA: Diagnosis not present

## 2019-05-26 DIAGNOSIS — Z7951 Long term (current) use of inhaled steroids: Secondary | ICD-10-CM | POA: Diagnosis not present

## 2019-05-26 DIAGNOSIS — Z6841 Body Mass Index (BMI) 40.0 and over, adult: Secondary | ICD-10-CM | POA: Diagnosis not present

## 2019-05-26 DIAGNOSIS — Z87440 Personal history of urinary (tract) infections: Secondary | ICD-10-CM | POA: Diagnosis not present

## 2019-05-26 DIAGNOSIS — Z7982 Long term (current) use of aspirin: Secondary | ICD-10-CM | POA: Diagnosis not present

## 2019-05-26 DIAGNOSIS — M5441 Lumbago with sciatica, right side: Secondary | ICD-10-CM | POA: Diagnosis not present

## 2019-05-26 DIAGNOSIS — R7301 Impaired fasting glucose: Secondary | ICD-10-CM | POA: Diagnosis not present

## 2019-05-26 DIAGNOSIS — Z993 Dependence on wheelchair: Secondary | ICD-10-CM | POA: Diagnosis not present

## 2019-05-26 DIAGNOSIS — M797 Fibromyalgia: Secondary | ICD-10-CM | POA: Diagnosis not present

## 2019-05-26 DIAGNOSIS — J309 Allergic rhinitis, unspecified: Secondary | ICD-10-CM | POA: Diagnosis not present

## 2019-06-02 DIAGNOSIS — R7301 Impaired fasting glucose: Secondary | ICD-10-CM | POA: Diagnosis not present

## 2019-06-02 DIAGNOSIS — Z7951 Long term (current) use of inhaled steroids: Secondary | ICD-10-CM | POA: Diagnosis not present

## 2019-06-02 DIAGNOSIS — M797 Fibromyalgia: Secondary | ICD-10-CM | POA: Diagnosis not present

## 2019-06-02 DIAGNOSIS — Z87440 Personal history of urinary (tract) infections: Secondary | ICD-10-CM | POA: Diagnosis not present

## 2019-06-02 DIAGNOSIS — Z993 Dependence on wheelchair: Secondary | ICD-10-CM | POA: Diagnosis not present

## 2019-06-02 DIAGNOSIS — J309 Allergic rhinitis, unspecified: Secondary | ICD-10-CM | POA: Diagnosis not present

## 2019-06-02 DIAGNOSIS — Z6841 Body Mass Index (BMI) 40.0 and over, adult: Secondary | ICD-10-CM | POA: Diagnosis not present

## 2019-06-02 DIAGNOSIS — M5441 Lumbago with sciatica, right side: Secondary | ICD-10-CM | POA: Diagnosis not present

## 2019-06-02 DIAGNOSIS — Z7982 Long term (current) use of aspirin: Secondary | ICD-10-CM | POA: Diagnosis not present

## 2019-06-05 DIAGNOSIS — J309 Allergic rhinitis, unspecified: Secondary | ICD-10-CM | POA: Diagnosis not present

## 2019-06-05 DIAGNOSIS — Z6841 Body Mass Index (BMI) 40.0 and over, adult: Secondary | ICD-10-CM | POA: Diagnosis not present

## 2019-06-05 DIAGNOSIS — M5441 Lumbago with sciatica, right side: Secondary | ICD-10-CM | POA: Diagnosis not present

## 2019-06-05 DIAGNOSIS — M797 Fibromyalgia: Secondary | ICD-10-CM | POA: Diagnosis not present

## 2019-06-05 DIAGNOSIS — Z7951 Long term (current) use of inhaled steroids: Secondary | ICD-10-CM | POA: Diagnosis not present

## 2019-06-05 DIAGNOSIS — Z993 Dependence on wheelchair: Secondary | ICD-10-CM | POA: Diagnosis not present

## 2019-06-05 DIAGNOSIS — R7301 Impaired fasting glucose: Secondary | ICD-10-CM | POA: Diagnosis not present

## 2019-06-05 DIAGNOSIS — Z87440 Personal history of urinary (tract) infections: Secondary | ICD-10-CM | POA: Diagnosis not present

## 2019-06-05 DIAGNOSIS — Z7982 Long term (current) use of aspirin: Secondary | ICD-10-CM | POA: Diagnosis not present

## 2019-06-09 DIAGNOSIS — Z6841 Body Mass Index (BMI) 40.0 and over, adult: Secondary | ICD-10-CM | POA: Diagnosis not present

## 2019-06-09 DIAGNOSIS — J309 Allergic rhinitis, unspecified: Secondary | ICD-10-CM | POA: Diagnosis not present

## 2019-06-09 DIAGNOSIS — M5441 Lumbago with sciatica, right side: Secondary | ICD-10-CM | POA: Diagnosis not present

## 2019-06-09 DIAGNOSIS — Z87440 Personal history of urinary (tract) infections: Secondary | ICD-10-CM | POA: Diagnosis not present

## 2019-06-09 DIAGNOSIS — G894 Chronic pain syndrome: Secondary | ICD-10-CM | POA: Diagnosis not present

## 2019-06-09 DIAGNOSIS — R7301 Impaired fasting glucose: Secondary | ICD-10-CM | POA: Diagnosis not present

## 2019-06-09 DIAGNOSIS — Z7951 Long term (current) use of inhaled steroids: Secondary | ICD-10-CM | POA: Diagnosis not present

## 2019-06-09 DIAGNOSIS — Z7982 Long term (current) use of aspirin: Secondary | ICD-10-CM | POA: Diagnosis not present

## 2019-06-09 DIAGNOSIS — M797 Fibromyalgia: Secondary | ICD-10-CM | POA: Diagnosis not present

## 2019-06-12 DIAGNOSIS — Z7951 Long term (current) use of inhaled steroids: Secondary | ICD-10-CM | POA: Diagnosis not present

## 2019-06-12 DIAGNOSIS — Z87440 Personal history of urinary (tract) infections: Secondary | ICD-10-CM | POA: Diagnosis not present

## 2019-06-12 DIAGNOSIS — G894 Chronic pain syndrome: Secondary | ICD-10-CM | POA: Diagnosis not present

## 2019-06-12 DIAGNOSIS — M797 Fibromyalgia: Secondary | ICD-10-CM | POA: Diagnosis not present

## 2019-06-12 DIAGNOSIS — Z6841 Body Mass Index (BMI) 40.0 and over, adult: Secondary | ICD-10-CM | POA: Diagnosis not present

## 2019-06-12 DIAGNOSIS — R7301 Impaired fasting glucose: Secondary | ICD-10-CM | POA: Diagnosis not present

## 2019-06-12 DIAGNOSIS — Z7982 Long term (current) use of aspirin: Secondary | ICD-10-CM | POA: Diagnosis not present

## 2019-06-12 DIAGNOSIS — J309 Allergic rhinitis, unspecified: Secondary | ICD-10-CM | POA: Diagnosis not present

## 2019-06-12 DIAGNOSIS — M5441 Lumbago with sciatica, right side: Secondary | ICD-10-CM | POA: Diagnosis not present

## 2019-06-23 DIAGNOSIS — M797 Fibromyalgia: Secondary | ICD-10-CM | POA: Diagnosis not present

## 2019-06-23 DIAGNOSIS — R7301 Impaired fasting glucose: Secondary | ICD-10-CM | POA: Diagnosis not present

## 2019-06-23 DIAGNOSIS — G894 Chronic pain syndrome: Secondary | ICD-10-CM | POA: Diagnosis not present

## 2019-06-23 DIAGNOSIS — Z7951 Long term (current) use of inhaled steroids: Secondary | ICD-10-CM | POA: Diagnosis not present

## 2019-06-23 DIAGNOSIS — J309 Allergic rhinitis, unspecified: Secondary | ICD-10-CM | POA: Diagnosis not present

## 2019-06-23 DIAGNOSIS — Z7982 Long term (current) use of aspirin: Secondary | ICD-10-CM | POA: Diagnosis not present

## 2019-06-23 DIAGNOSIS — Z6841 Body Mass Index (BMI) 40.0 and over, adult: Secondary | ICD-10-CM | POA: Diagnosis not present

## 2019-06-23 DIAGNOSIS — Z87440 Personal history of urinary (tract) infections: Secondary | ICD-10-CM | POA: Diagnosis not present

## 2019-06-23 DIAGNOSIS — M5441 Lumbago with sciatica, right side: Secondary | ICD-10-CM | POA: Diagnosis not present

## 2019-06-29 DIAGNOSIS — R7301 Impaired fasting glucose: Secondary | ICD-10-CM | POA: Diagnosis not present

## 2019-06-29 DIAGNOSIS — M5441 Lumbago with sciatica, right side: Secondary | ICD-10-CM | POA: Diagnosis not present

## 2019-06-29 DIAGNOSIS — Z7951 Long term (current) use of inhaled steroids: Secondary | ICD-10-CM | POA: Diagnosis not present

## 2019-06-29 DIAGNOSIS — G894 Chronic pain syndrome: Secondary | ICD-10-CM | POA: Diagnosis not present

## 2019-06-29 DIAGNOSIS — J309 Allergic rhinitis, unspecified: Secondary | ICD-10-CM | POA: Diagnosis not present

## 2019-06-29 DIAGNOSIS — Z7982 Long term (current) use of aspirin: Secondary | ICD-10-CM | POA: Diagnosis not present

## 2019-06-29 DIAGNOSIS — M797 Fibromyalgia: Secondary | ICD-10-CM | POA: Diagnosis not present

## 2019-06-29 DIAGNOSIS — Z6841 Body Mass Index (BMI) 40.0 and over, adult: Secondary | ICD-10-CM | POA: Diagnosis not present

## 2019-06-29 DIAGNOSIS — Z87440 Personal history of urinary (tract) infections: Secondary | ICD-10-CM | POA: Diagnosis not present

## 2019-07-30 DIAGNOSIS — R69 Illness, unspecified: Secondary | ICD-10-CM | POA: Diagnosis not present

## 2019-08-07 DIAGNOSIS — R69 Illness, unspecified: Secondary | ICD-10-CM | POA: Diagnosis not present

## 2019-08-07 DIAGNOSIS — G43909 Migraine, unspecified, not intractable, without status migrainosus: Secondary | ICD-10-CM | POA: Diagnosis not present

## 2019-08-07 DIAGNOSIS — M797 Fibromyalgia: Secondary | ICD-10-CM | POA: Diagnosis not present

## 2019-08-07 DIAGNOSIS — M549 Dorsalgia, unspecified: Secondary | ICD-10-CM | POA: Diagnosis not present

## 2019-09-17 DIAGNOSIS — J309 Allergic rhinitis, unspecified: Secondary | ICD-10-CM | POA: Diagnosis not present

## 2019-09-17 DIAGNOSIS — R69 Illness, unspecified: Secondary | ICD-10-CM | POA: Diagnosis not present

## 2019-09-17 DIAGNOSIS — J0100 Acute maxillary sinusitis, unspecified: Secondary | ICD-10-CM | POA: Diagnosis not present

## 2019-09-17 DIAGNOSIS — F332 Major depressive disorder, recurrent severe without psychotic features: Secondary | ICD-10-CM | POA: Diagnosis not present

## 2019-11-04 ENCOUNTER — Emergency Department (HOSPITAL_COMMUNITY)
Admission: EM | Admit: 2019-11-04 | Discharge: 2019-11-04 | Disposition: A | Discharge status: Home / Self Care | Payer: Medicare HMO | Attending: Emergency Medicine | Admitting: Emergency Medicine

## 2019-11-04 ENCOUNTER — Emergency Department (HOSPITAL_COMMUNITY): Payer: Medicare HMO

## 2019-11-04 DIAGNOSIS — E039 Hypothyroidism, unspecified: Secondary | ICD-10-CM | POA: Insufficient documentation

## 2019-11-04 DIAGNOSIS — J45909 Unspecified asthma, uncomplicated: Secondary | ICD-10-CM | POA: Insufficient documentation

## 2019-11-04 DIAGNOSIS — R109 Unspecified abdominal pain: Secondary | ICD-10-CM

## 2019-11-04 DIAGNOSIS — Z79899 Other long term (current) drug therapy: Secondary | ICD-10-CM | POA: Diagnosis not present

## 2019-11-04 DIAGNOSIS — R1031 Right lower quadrant pain: Secondary | ICD-10-CM | POA: Diagnosis present

## 2019-11-04 DIAGNOSIS — Z21 Asymptomatic human immunodeficiency virus [HIV] infection status: Secondary | ICD-10-CM | POA: Insufficient documentation

## 2019-11-04 DIAGNOSIS — F17210 Nicotine dependence, cigarettes, uncomplicated: Secondary | ICD-10-CM | POA: Insufficient documentation

## 2019-11-04 DIAGNOSIS — Z590 Homelessness: Secondary | ICD-10-CM | POA: Insufficient documentation

## 2019-11-04 DIAGNOSIS — B029 Zoster without complications: Secondary | ICD-10-CM | POA: Diagnosis not present

## 2019-11-04 DIAGNOSIS — Z8673 Personal history of transient ischemic attack (TIA), and cerebral infarction without residual deficits: Secondary | ICD-10-CM | POA: Diagnosis not present

## 2019-11-04 DIAGNOSIS — I10 Essential (primary) hypertension: Secondary | ICD-10-CM | POA: Diagnosis not present

## 2019-11-04 DIAGNOSIS — Z9049 Acquired absence of other specified parts of digestive tract: Secondary | ICD-10-CM | POA: Diagnosis not present

## 2019-11-04 DIAGNOSIS — R69 Illness, unspecified: Secondary | ICD-10-CM | POA: Diagnosis not present

## 2019-11-04 DIAGNOSIS — R1030 Lower abdominal pain, unspecified: Secondary | ICD-10-CM | POA: Diagnosis not present

## 2019-11-04 LAB — URINALYSIS, ROUTINE W REFLEX MICROSCOPIC
Bilirubin Urine: NEGATIVE
Glucose, UA: NEGATIVE mg/dL
Ketones, ur: NEGATIVE mg/dL
Leukocytes,Ua: NEGATIVE
Nitrite: NEGATIVE
Protein, ur: 30 mg/dL — AB
Specific Gravity, Urine: 1.018 (ref 1.005–1.030)
pH: 5 (ref 5.0–8.0)

## 2019-11-04 LAB — CBC
HCT: 42.6 % (ref 36.0–46.0)
Hemoglobin: 13.4 g/dL (ref 12.0–15.0)
MCH: 26.8 pg (ref 26.0–34.0)
MCHC: 31.5 g/dL (ref 30.0–36.0)
MCV: 85.2 fL (ref 80.0–100.0)
Platelets: 344 10*3/uL (ref 150–400)
RBC: 5 MIL/uL (ref 3.87–5.11)
RDW: 13.3 % (ref 11.5–15.5)
WBC: 9.1 10*3/uL (ref 4.0–10.5)
nRBC: 0 % (ref 0.0–0.2)

## 2019-11-04 LAB — I-STAT BETA HCG BLOOD, ED (MC, WL, AP ONLY): I-stat hCG, quantitative: <5 m[IU]/mL (ref <5)

## 2019-11-04 LAB — BASIC METABOLIC PANEL
Anion gap: 15 (ref 5–15)
BUN: 5 mg/dL — ABNORMAL LOW (ref 6–20)
CO2: 24 mmol/L (ref 22–32)
Calcium: 9.1 mg/dL (ref 8.9–10.3)
Chloride: 102 mmol/L (ref 98–111)
Creatinine, Ser: 0.97 mg/dL (ref 0.44–1.00)
GFR calc Af Amer: >60 mL/min (ref >60)
GFR calc non Af Amer: >60 mL/min (ref >60)
Glucose, Bld: 134 mg/dL — ABNORMAL HIGH (ref 70–99)
Potassium: 3.2 mmol/L — ABNORMAL LOW (ref 3.5–5.1)
Sodium: 141 mmol/L (ref 135–145)

## 2019-11-04 MED ORDER — KETOROLAC TROMETHAMINE 30 MG/ML IJ SOLN
30.0000 mg | Freq: Once | INTRAMUSCULAR | Status: AC
  Administered 2019-11-04: 30 mg via INTRAVENOUS
  Filled 2019-11-04: qty 1

## 2019-11-04 MED ORDER — MORPHINE SULFATE (PF) 4 MG/ML IV SOLN
4.0000 mg | Freq: Once | INTRAVENOUS | Status: AC
  Administered 2019-11-04: 4 mg via INTRAVENOUS
  Filled 2019-11-04: qty 1

## 2019-11-04 MED ORDER — POTASSIUM CHLORIDE CRYS ER 20 MEQ PO TBCR
40.0000 meq | EXTENDED_RELEASE_TABLET | Freq: Once | ORAL | Status: AC
  Administered 2019-11-04: 40 meq via ORAL
  Filled 2019-11-04: qty 2

## 2019-11-04 MED ORDER — ACYCLOVIR 400 MG PO TABS: 800.0000 mg | ORAL_TABLET | Freq: Every day | ORAL | 0 refills | Status: AC

## 2019-11-04 MED ORDER — ONDANSETRON 4 MG PO TBDP: 4.0000 mg | ORAL_TABLET | Freq: Three times a day (TID) | ORAL | 0 refills | Status: DC | PRN

## 2019-11-04 NOTE — ED Provider Notes (Addendum)
[facility] EMERGENCY DEPARTMENT Provider Note   CSN: [id number] Arrival date & time: 11/04/19  1533     History Chief Complaint  Patient presents with  . Flank Pain    Brianna Roberts is a 54 y.o. female.  HPI Patient is a 54 year old female with a history of anxiety, asthma, HTLV-1, degenerative disc disease, depression, fibromyalgia, HIV, HTN, insomnia  Patient is presented today with right-sided flank pain that she states feels like a kidney stone.  She states that she has had 3-4 kidney stones per year.  She states that that was present this way.  She states that she has significant hematuria yesterday.  She states that the pain has been intermittent, sharp, 10/10 pain that is nonpositional not worse with exertion for the past 1 week.  She states that last night at 10 PM she had significant flank pain and states that she feels like she passed a kidney stone.  She states that the hematuria occurred just prior to this event.  She states that she has continued to have spasmy sharp significant pain since that time.  Patient states that she took one of her oxycodone that she has for her chronic pain issues/fibromyalgia with minimal relief.  She has not taken any other pain medications prior to arrival.  She denies any nausea or vomiting.  Denies any fevers or chills.  Denies any urinary frequency urgency.  Denies any burning with urination.    Past Medical History:  Diagnosis Date  . Allergy   . Anxiety   . Arthritis   . Asthma   . 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 Concussion   . DDD (degenerative disc disease), cervical   . Depression   . Fibromyalgia   . HIV (human immunodeficiency virus infection) (HCC)   . Homelessness   . Hypertension   . Hypothyroidism   . Insomnia   . Neuromuscular disorder (HCC)   . Thyroid disease   . TIA (transient ischemic attack)     Patient Active Problem List   Diagnosis Date Noted  .  Dysarthria 12/13/2017  . Headache 12/13/2017  . Chronic pain syndrome 03/17/2017  . Incomplete emptying of bladder 02/22/2017  . Prediabetes 12/31/2016  . Post concussion syndrome 10/06/2016  . 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 Fibromyalgia 10/05/2016  . Benign essential HTN 10/05/2016  . Hypothyroidism 10/05/2016  . Allergic rhinitis 10/05/2016  . Chronic fatigue syndrome 10/05/2016  . Asthma, chronic 10/05/2016  . HSV (herpes simplex virus) anogenital infection 10/05/2016  . Anxiety and depression 10/05/2016  . Bilateral leg edema 10/05/2016  . Ovarian cyst 10/05/2016  . Nephrolithiasis 10/05/2016  . IBS (irritable bowel syndrome) 10/05/2016  . OSA (obstructive sleep apnea) 10/05/2016  . Left-sided weakness 10/05/2016  . Migraine 10/05/2016  . BMI 45.0-49.9, adult (HCC) 10/05/2016  . DDD (degenerative disc disease), cervical   . Insomnia     Past Surgical History:  Procedure Laterality Date  . ABDOMINAL HYSTERECTOMY     Partial-ovaries and tubes remain  . BREAST BIOPSY Left   . CHOLECYSTECTOMY  2006  . COLONOSCOPY  07/27/2015   [place]  . HERNIA REPAIR  1997  . TUMOR EXCISION Right 1984   Sinuses  . TUMOR EXCISION Left 2015   sinuses  . URACHAL CYST EXCISION  1997     OB History   No obstetric history on file.     Family History  Problem Relation  Age of Onset  . Diabetes Mother   . Heart disease Mother   . Hyperlipidemia Mother   . Hypertension Mother   . Stroke Mother   . Mental illness Mother   . Heart failure Mother   . Headache Mother        Rocky Morel Cyst  . Breast cancer Mother 47       diagnosed 3 times  . Heart disease Father   . Alcohol abuse Father   . Hypertension Sister   . Heart disease Brother   . Mental illness Brother   . COPD Brother   . Heart failure Brother   . Hypertension Sister   . Colon cancer Neg Hx     Social History   Tobacco Use  . Smoking status: Current Every Day Smoker     Packs/day: 0.30    Years: 33.00    Pack years: 9.90    Types: Cigarettes  . Smokeless tobacco: Never Used  Substance Use Topics  . Alcohol use: No  . Drug use: No    Home Medications Prior to Admission medications   Medication Sig Start Date End Date Taking? Authorizing Provider  acetaminophen (TYLENOL) 325 MG tablet Take 650 mg by mouth every 6 (six) hours as needed (for pain).    [provider]  acyclovir (ZOVIRAX) 200 MG capsule Take 1 capsule (200 mg total) by mouth 2 (two) times daily. For Outbreak: 2 capsules (400 mg) TID x 5 days. Patient taking differently: Take 400 mg by mouth 2 (two) times daily.  11/01/17   Harrison Mons, PA  acyclovir (ZOVIRAX) 400 MG tablet Take 2 tablets (800 mg total) by mouth 5 (five) times daily for 7 days. 11/04/19 11/11/19  Tedd Sias, PA  albuterol (PROVENTIL HFA;VENTOLIN HFA) 108 (90 Base) MCG/ACT inhaler Inhale 1-2 puffs into the lungs every 6 (six) hours as needed for wheezing or shortness of breath. 08/09/17   Harrison Mons, PA  amitriptyline (ELAVIL) 75 MG tablet Take 1 tablet (75 mg total) by mouth at bedtime. 08/09/17   Harrison Mons, PA  amLODipine (NORVASC) 10 MG tablet Take 1 tablet (10 mg total) by mouth daily. 08/21/17   Harrison Mons, PA  ARIPiprazole (ABILIFY) 10 MG tablet Take 1 tablet (10 mg total) by mouth daily. 10/05/16   Harrison Mons, PA  ASPERCREME LIDOCAINE EX Apply 1 application topically 2 (two) times daily as needed (for muscle pain).    [provider]  azelastine (ASTELIN) 0.1 % nasal spray USE 2 SPRAYS EACH NOSTRIL TWICE DAILY. 10/15/17   Harrison Mons, PA  baclofen (LIORESAL) 10 MG tablet TAKE 1 2 TO 1 (ONE HALF TO ONE) TABLET BY MOUTH AS NEEDED TO TWICE DAILY FOR HEADACHE. LIMIT TO 2 HEADACHE DAYS PER WEEK. AVOID DAILY USE. 01/27/18   [provider]  budesonide-formoterol (SYMBICORT) 160-4.5 MCG/ACT inhaler Inhale 2 puffs into the lungs 2 (two) times daily. 12/27/16   Harrison Mons, PA   diazepam (VALIUM) 5 MG tablet Take 1 tablet (5 mg total) by mouth every 8 (eight) hours as needed for anxiety. 10/14/17   Harrison Mons, PA  diphenhydrAMINE (BENADRYL) 25 mg capsule Take 25 mg by mouth 2 (two) times daily.    [provider]  fluticasone (FLONASE) 50 MCG/ACT nasal spray USE 1 SPRAY IN EACH NOSTRIL DAILY. 04/02/17   Harrison Mons, PA  furosemide (LASIX) 40 MG tablet Take 1 tablet (40 mg total) by mouth 2 (two) times daily. Patient taking differently: Take 80 mg by  mouth at bedtime.  08/09/17   Porfirio OarJeffery, Chelle, PA  glucose blood test strip Use as instructed 04/17/17   Porfirio OarJeffery, Chelle, PA  hydrOXYzine (ATARAX/VISTARIL) 10 MG tablet Take 1 tablet (10 mg total) by mouth at bedtime. Patient taking differently: Take 10 mg by mouth at bedtime as needed (itching or angioedema).  10/05/16   Porfirio OarJeffery, Chelle, PA  Incontinence Supply Disposable (PREVAIL WASHCLOTHS) MISC Use daily as directed 06/03/17   Porfirio OarJeffery, Chelle, PA  ipratropium-albuterol (DUONEB) 0.5-2.5 (3) MG/3ML SOLN Take 3 mLs by nebulization every 6 (six) hours as needed (for breathing). Patient taking differently: Take 3 mLs by nebulization every 6 (six) hours as needed (for shortness of breath or wheezing).  10/05/16   Porfirio OarJeffery, Chelle, PA  Lancets Thin MISC Use to check home glucose daily 03/28/17   Porfirio OarJeffery, Chelle, PA  levothyroxine (SYNTHROID, LEVOTHROID) 50 MCG tablet Take 1 tablet (50 mcg total) by mouth daily before breakfast. Patient taking differently: Take 50 mcg by mouth daily.  08/17/17   Porfirio OarJeffery, Chelle, PA  losartan (COZAAR) 100 MG tablet Take 1 tablet (100 mg total) by mouth daily. 08/09/17   Porfirio OarJeffery, Chelle, PA  meloxicam (MOBIC) 15 MG tablet TAKE 1 TABLET EACH DAY. 11/08/17   Porfirio OarJeffery, Chelle, PA  methocarbamol (ROBAXIN) 500 MG tablet Take 2 tablets (1,000 mg total) by mouth every 8 (eight) hours as needed for muscle spasms. 02/01/17   Loren RacerYelverton, David, MD  metoprolol tartrate (LOPRESSOR) 100 MG tablet Take 1  tablet (100 mg total) by mouth 2 (two) times daily. 08/09/17   Porfirio OarJeffery, Chelle, PA  Nerve Stimulator (PRO COMFORT TENS UNIT) DEVI 1 Device by Does not apply route daily as needed. 12/27/16   Porfirio OarJeffery, Chelle, PA  nystatin cream (MYCOSTATIN) Apply 1 application topically 2 (two) times daily.    [provider]  ondansetron (ZOFRAN ODT) 4 MG disintegrating tablet Take 1 tablet (4 mg total) by mouth every 8 (eight) hours as needed for nausea or vomiting. 11/04/19   Gailen ShelterFondaw, Avyukth Bontempo S, PA  oxyCODONE (OXY IR/ROXICODONE) 5 MG immediate release tablet Take 1 tablet (5 mg total) by mouth every 4 (four) hours as needed for severe pain. 10/15/17   Porfirio OarJeffery, Chelle, PA  potassium chloride SA (K-DUR,KLOR-CON) 20 MEQ tablet Take 1 tablet (20 mEq total) by mouth 2 (two) times daily. 06/24/17   Porfirio OarJeffery, Chelle, PA  sertraline (ZOLOFT) 100 MG tablet Take 100 mg by mouth daily.    [provider]  traMADol (ULTRAM) 50 MG tablet Take 1 tablet (50 mg total) by mouth 3 (three) times daily as needed. Patient taking differently: Take 50 mg by mouth at bedtime.  10/14/17   Porfirio OarJeffery, Chelle, PA  triamcinolone ointment (KENALOG) 0.5 % Apply 1 application topically 2 (two) times daily.    [provider]  vitamin B-12 (CYANOCOBALAMIN) 100 MCG tablet Take 100 mcg by mouth daily.    [provider]  zonisamide (ZONEGRAN) 25 MG capsule TAKE 1 CAPSULE BY MOUTH AT BEDTIME FOR 7 DAYS THEN INCREASE BY 1 CAPSULE BY MOUTH WEEKLY UNTIL TAKING 4 CAPSULES BY MOUTH AT BEDTIME. 01/27/18   [provider]    Allergies    Hydromorphone, Iodinated diagnostic agents, Sulfa antibiotics, Codeine, Hydromorphone hcl, Nickel, Penicillins, Spironolactone, and Adhesive [tape]  Review of Systems   Review of Systems  Constitutional: Negative for chills and fever.  HENT: Negative for congestion.   Eyes: Negative for pain.  Respiratory: Negative for cough and shortness of breath.   Cardiovascular: Negative for chest  pain  and leg swelling.  Gastrointestinal: Negative for abdominal pain, nausea and vomiting.  Genitourinary: Positive for flank pain and hematuria. Negative for dysuria, pelvic pain, urgency, vaginal bleeding, vaginal discharge and vaginal pain.  Musculoskeletal: Negative for myalgias.  Skin: Negative for rash.  Neurological: Negative for dizziness and headaches.    Physical Exam Updated Vital Signs BP 124/77   Pulse 89   Temp 98 F (36.7 C) (Oral)   Resp 18   SpO2 99%   Physical Exam Vitals and nursing note reviewed.  Constitutional:      General: She is in acute distress.     Appearance: She is obese.     Comments: Morbidly obese 54 year old female, pleasant, able answer questions appropriately and follow commands.  Is watching television on her phone upon my entering of the room  HENT:     Head: Normocephalic and atraumatic.     Nose: Nose normal.     Mouth/Throat:     Mouth: Mucous membranes are moist.  Eyes:     General: No scleral icterus. Cardiovascular:     Rate and Rhythm: Normal rate and regular rhythm.     Pulses: Normal pulses.     Heart sounds: Normal heart sounds.  Pulmonary:     Effort: Pulmonary effort is normal. No respiratory distress.     Breath sounds: No wheezing.  Abdominal:     Palpations: Abdomen is soft.     Tenderness: There is abdominal tenderness. There is right CVA tenderness. There is no left CVA tenderness, guarding or rebound.     Hernia: No hernia is present.     Comments: Tenderness to palpation of the right flank.  There is tenderness over the rib cage.  This reproduces the pain.  Morbidly obese abdomen  Musculoskeletal:     Cervical back: Normal range of motion.     Right lower leg: No edema.     Left lower leg: No edema.  Skin:    General: Skin is warm and dry.     Capillary Refill: Capillary refill takes less than 2 seconds.  Neurological:     Mental Status: She is alert. Mental status is at baseline.  Psychiatric:        Mood  and Affect: Mood normal.        Behavior: Behavior normal.     ED Results / Procedures / Treatments   Labs (all labs ordered are listed, but only abnormal results are displayed) Labs Reviewed  URINALYSIS, ROUTINE W REFLEX MICROSCOPIC - Abnormal; Notable for the following components:      Result Value   APPearance HAZY (*)    Hgb urine dipstick SMALL (*)    Protein, ur 30 (*)    Bacteria, UA FEW (*)    All other components within normal limits  BASIC METABOLIC PANEL - Abnormal; Notable for the following components:   Potassium 3.2 (*)    Glucose, Bld 134 (*)    BUN 5 (*)    All other components within normal limits  CBC  I-STAT BETA HCG BLOOD, ED ([facility], [facility], [facility] ONLY)    EKG None  Radiology CT Renal Stone Study  Result Date: 11/04/2019 CLINICAL DATA:  Right flank pain. EXAM: CT ABDOMEN AND PELVIS WITHOUT CONTRAST TECHNIQUE: Multidetector CT imaging of the abdomen and pelvis was performed following the standard protocol without IV contrast. COMPARISON:  December 30, 2018 FINDINGS: Lower chest: No acute abnormality. Hepatobiliary: No focal liver abnormality is seen. Status post cholecystectomy. No biliary dilatation.  Pancreas: Unremarkable. No pancreatic ductal dilatation or surrounding inflammatory changes. Spleen: Normal in size without focal abnormality. Adrenals/Urinary Tract: Adrenal glands are unremarkable. Kidneys are normal, without renal calculi, focal lesion, or hydronephrosis. Bladder is unremarkable. Stomach/Bowel: Stomach is within normal limits. Appendix appears normal. No evidence of bowel wall thickening, distention, or inflammatory changes. Vascular/Lymphatic: No significant vascular findings are present. No enlarged abdominal or pelvic lymph nodes. Reproductive: Status post hysterectomy. No adnexal masses. Other: No abdominal wall hernia or abnormality. No abdominopelvic ascites. Musculoskeletal: No acute or significant osseous findings. IMPRESSION: 1. No CT evidence of acute  intra-abdominal pathology. 2. Evidence of prior cholecystectomy. Electronically Signed   By: Aram Candela M.D.   On: 11/04/2019 20:17    Procedures Procedures (including critical care time)  Medications Ordered in ED Medications  morphine 4 MG/ML injection 4 mg (4 mg Intravenous Given 11/04/19 2048)  ketorolac (TORADOL) 30 MG/ML injection 30 mg (30 mg Intravenous Given 11/04/19 2048)  potassium chloride SA (KLOR-CON) CR tablet 40 mEq (40 mEq Oral Given 11/04/19 2057)    ED Course  I have reviewed the triage vital signs and the nursing notes.  Pertinent labs & imaging results that were available during my care of the patient were reviewed by me and considered in my medical decision making (see chart for details).    MDM Rules/Calculators/A&P                      Patient is 54 year old female with past medical history detailed above presented today for flank pain of right side.  She describes this as burning, achy, crampy.  She does occasionally refer to as stabbing however.  She states that it is 10/10.  She states that she has frequent, 3-4 kidney stones per year.  She states that this feels similar to this.  She states she has no rashes area however after inquiring she states that she has had shingles several times in this area but it feels similar to what she is experiencing today.  Patient states she is on Valtrex chronically for this issue.  She states that she has been having some diarrhea but no nausea, vomiting fevers or chills.  She states that she has noted some hematuria over the past couple days but denies any dysuria urgency urgency.  Given patient's history and physical exam I am concerned for kidney stone VS early zoster rash VS urinary tract infection.  Beta-hCG is negative doubt ectopic pregnancy or pregnancy.  CBC without leukocytosis or anemia.  BMP mildly low potassium 3.2 no other acute abnormalities.  Patient repleted with 40 mEq of p.o. potassium. This is likely  secondary to her diarrhea.  Small amount of hemoglobin in the ER and.  She has few bacteria no leukocytes or nitrates.  Doubt urinary tract infection as she has no symptoms of this apart from hematuria which I suspect is secondary to a kidney stone that she either actively has or has recently passed.  Pelvis without contrast stone study was conducted.  There is no evidence of kidney stone.  Appendix was well visualized and has no acute abnormality no evidence of infection.  No periappendiceal stranding.  Patient's pain seems to be significantly improving she has not even received pain medication at this point.  I will provide her with Toradol IM and morphine IM as she does not currently have a IV.  She will follow up with urology for her frequent stones.  Suspect that her mild hematuria and resolving  pain are due to her recently passed kidney stone--as she states that she passed 1 last night.  She will take Tylenol and ibuprofen at home.  She will follow-up with urology as well as her primary care doctor.  Patient is discharged with Zofran and strict return precautions.  As patient has no visualizable shingles rash at this time I have low suspicion for this however she states that it is very consistent with her shingles pain that she is at the past.  She states that she wanted to be vaccinated however she has not been yet.  She states that whenever she has recurrence of shingles rash she has her typical 400 mg acyclovir increased to 800 mg 5 times daily.  I will provide this for patient and recommend close follow-up with her PCP for reevaluation.  Final Clinical Impression(s) / ED Diagnoses Final diagnoses:  Flank pain  Right flank pain  Herpes zoster without complication   The medical records were personally reviewed by myself. I personally reviewed all lab results and interpreted all imaging studies and either concurred with their official read or contacted radiology for clarification. Additional  history obtained from old records  This patient appears reasonably screened and I doubt any other medical condition requiring further workup, evaluation, or treatment in the ED at this time prior to discharge.   Patient's vitals are WNL apart from vital sign abnormalities discussed above, patient is in NAD, and able to ambulate in the ED at their baseline and able to tolerate PO.  Pain has been managed or a plan has been made for home management and has no complaints prior to discharge. Patient is comfortable with above plan and for discharge at this time. All questions were answered prior to disposition. Results from the ER workup discussed with the patient face to face and all questions answered to the best of my ability. The patient is safe for discharge with strict return precautions. Patient appears safe for discharge with appropriate follow-up. Conveyed my impression with the patient and they voiced understanding and are agreeable to plan.   An After Visit Summary was printed and given to the patient.  Portions of this note were generated with Scientist, clinical (histocompatibility and immunogenetics). Dictation errors may occur despite best attempts at proofreading.    Rx / DC Orders ED Discharge Orders         Ordered    ondansetron (ZOFRAN ODT) 4 MG disintegrating tablet  Every 8 hours PRN     11/04/19 2035    acyclovir (ZOVIRAX) 400 MG tablet  5 times daily     11/04/19 2059           Gailen Shelter, [place] 11/04/19 2054    Gailen Shelter, [place] 11/04/19 2101    Mancel Bale, MD 11/05/19 0003

## 2019-11-04 NOTE — ED Notes (Signed)
Patient verbalizes understanding of discharge instructions. Opportunity for questioning and answers were provided. Armband removed by staff, pt discharged from ED by wheelchair   

## 2019-11-04 NOTE — ED Triage Notes (Signed)
Pt here with R sided flank pain. Hx of kidney stones, states this feels the same. Pt appears uncomfortable.

## 2019-11-04 NOTE — ED Notes (Signed)
Patient transported to CT 

## 2019-11-04 NOTE — Discharge Instructions (Addendum)
Please call tomorrow morning to make an appoint with your urologist.  I have included the phone number for urology if you to call.   Suspect your flank pain is either due to a recently passed kidney stone that is causing residual pain or shingles which can present with no rash early in the course.  Please use Tylenol or ibuprofen for pain.  You may use 600 mg ibuprofen every 6 hours or 1000 mg of Tylenol every 6 hours.  You may choose to alternate between the 2.  This would be most effective.  Not to exceed 4 g of Tylenol within 24 hours.  Not to exceed 3200 mg ibuprofen 24 hours.  Please return to ED if you have any new or concerning symptoms.

## 2019-11-09 DIAGNOSIS — M546 Pain in thoracic spine: Secondary | ICD-10-CM | POA: Diagnosis not present

## 2019-11-09 DIAGNOSIS — Z87442 Personal history of urinary calculi: Secondary | ICD-10-CM | POA: Diagnosis not present

## 2019-11-09 DIAGNOSIS — M5489 Other dorsalgia: Secondary | ICD-10-CM | POA: Diagnosis not present

## 2020-01-20 DIAGNOSIS — R2689 Other abnormalities of gait and mobility: Secondary | ICD-10-CM | POA: Diagnosis not present

## 2020-01-20 DIAGNOSIS — R5381 Other malaise: Secondary | ICD-10-CM | POA: Diagnosis not present

## 2020-01-20 DIAGNOSIS — M797 Fibromyalgia: Secondary | ICD-10-CM | POA: Diagnosis not present

## 2020-01-20 DIAGNOSIS — M6281 Muscle weakness (generalized): Secondary | ICD-10-CM | POA: Diagnosis not present

## 2020-01-20 DIAGNOSIS — R109 Unspecified abdominal pain: Secondary | ICD-10-CM | POA: Diagnosis not present

## 2020-01-20 DIAGNOSIS — R1031 Right lower quadrant pain: Secondary | ICD-10-CM | POA: Diagnosis not present

## 2020-01-20 DIAGNOSIS — I10 Essential (primary) hypertension: Secondary | ICD-10-CM | POA: Diagnosis not present

## 2020-01-20 DIAGNOSIS — N898 Other specified noninflammatory disorders of vagina: Secondary | ICD-10-CM | POA: Diagnosis not present

## 2020-01-20 DIAGNOSIS — M545 Low back pain: Secondary | ICD-10-CM | POA: Diagnosis not present

## 2020-01-27 DIAGNOSIS — N200 Calculus of kidney: Secondary | ICD-10-CM | POA: Diagnosis not present

## 2020-01-27 DIAGNOSIS — R7303 Prediabetes: Secondary | ICD-10-CM | POA: Diagnosis not present

## 2020-01-27 DIAGNOSIS — M439 Deforming dorsopathy, unspecified: Secondary | ICD-10-CM | POA: Diagnosis not present

## 2020-01-27 DIAGNOSIS — M5441 Lumbago with sciatica, right side: Secondary | ICD-10-CM | POA: Diagnosis not present

## 2020-01-27 DIAGNOSIS — G894 Chronic pain syndrome: Secondary | ICD-10-CM | POA: Diagnosis not present

## 2020-01-27 DIAGNOSIS — I10 Essential (primary) hypertension: Secondary | ICD-10-CM | POA: Diagnosis not present

## 2020-01-27 DIAGNOSIS — M94261 Chondromalacia, right knee: Secondary | ICD-10-CM | POA: Diagnosis not present

## 2020-01-27 DIAGNOSIS — M797 Fibromyalgia: Secondary | ICD-10-CM | POA: Diagnosis not present

## 2020-01-27 DIAGNOSIS — R69 Illness, unspecified: Secondary | ICD-10-CM | POA: Diagnosis not present

## 2020-01-29 DIAGNOSIS — M439 Deforming dorsopathy, unspecified: Secondary | ICD-10-CM | POA: Diagnosis not present

## 2020-01-29 DIAGNOSIS — G894 Chronic pain syndrome: Secondary | ICD-10-CM | POA: Diagnosis not present

## 2020-01-29 DIAGNOSIS — M94261 Chondromalacia, right knee: Secondary | ICD-10-CM | POA: Diagnosis not present

## 2020-01-29 DIAGNOSIS — R69 Illness, unspecified: Secondary | ICD-10-CM | POA: Diagnosis not present

## 2020-01-29 DIAGNOSIS — R7303 Prediabetes: Secondary | ICD-10-CM | POA: Diagnosis not present

## 2020-01-29 DIAGNOSIS — I10 Essential (primary) hypertension: Secondary | ICD-10-CM | POA: Diagnosis not present

## 2020-01-29 DIAGNOSIS — N200 Calculus of kidney: Secondary | ICD-10-CM | POA: Diagnosis not present

## 2020-01-29 DIAGNOSIS — M797 Fibromyalgia: Secondary | ICD-10-CM | POA: Diagnosis not present

## 2020-01-29 DIAGNOSIS — M5441 Lumbago with sciatica, right side: Secondary | ICD-10-CM | POA: Diagnosis not present

## 2020-02-02 DIAGNOSIS — N200 Calculus of kidney: Secondary | ICD-10-CM | POA: Diagnosis not present

## 2020-02-02 DIAGNOSIS — M5441 Lumbago with sciatica, right side: Secondary | ICD-10-CM | POA: Diagnosis not present

## 2020-02-02 DIAGNOSIS — M94261 Chondromalacia, right knee: Secondary | ICD-10-CM | POA: Diagnosis not present

## 2020-02-02 DIAGNOSIS — I10 Essential (primary) hypertension: Secondary | ICD-10-CM | POA: Diagnosis not present

## 2020-02-02 DIAGNOSIS — G894 Chronic pain syndrome: Secondary | ICD-10-CM | POA: Diagnosis not present

## 2020-02-02 DIAGNOSIS — R69 Illness, unspecified: Secondary | ICD-10-CM | POA: Diagnosis not present

## 2020-02-02 DIAGNOSIS — R7303 Prediabetes: Secondary | ICD-10-CM | POA: Diagnosis not present

## 2020-02-02 DIAGNOSIS — M439 Deforming dorsopathy, unspecified: Secondary | ICD-10-CM | POA: Diagnosis not present

## 2020-02-02 DIAGNOSIS — M797 Fibromyalgia: Secondary | ICD-10-CM | POA: Diagnosis not present

## 2020-02-09 DIAGNOSIS — N200 Calculus of kidney: Secondary | ICD-10-CM | POA: Diagnosis not present

## 2020-02-09 DIAGNOSIS — M5441 Lumbago with sciatica, right side: Secondary | ICD-10-CM | POA: Diagnosis not present

## 2020-02-09 DIAGNOSIS — M797 Fibromyalgia: Secondary | ICD-10-CM | POA: Diagnosis not present

## 2020-02-09 DIAGNOSIS — R7303 Prediabetes: Secondary | ICD-10-CM | POA: Diagnosis not present

## 2020-02-09 DIAGNOSIS — R69 Illness, unspecified: Secondary | ICD-10-CM | POA: Diagnosis not present

## 2020-02-09 DIAGNOSIS — I10 Essential (primary) hypertension: Secondary | ICD-10-CM | POA: Diagnosis not present

## 2020-02-09 DIAGNOSIS — M94261 Chondromalacia, right knee: Secondary | ICD-10-CM | POA: Diagnosis not present

## 2020-02-09 DIAGNOSIS — M439 Deforming dorsopathy, unspecified: Secondary | ICD-10-CM | POA: Diagnosis not present

## 2020-02-09 DIAGNOSIS — G894 Chronic pain syndrome: Secondary | ICD-10-CM | POA: Diagnosis not present

## 2020-02-12 DIAGNOSIS — R69 Illness, unspecified: Secondary | ICD-10-CM | POA: Diagnosis not present

## 2020-02-12 DIAGNOSIS — M797 Fibromyalgia: Secondary | ICD-10-CM | POA: Diagnosis not present

## 2020-02-12 DIAGNOSIS — N200 Calculus of kidney: Secondary | ICD-10-CM | POA: Diagnosis not present

## 2020-02-12 DIAGNOSIS — G894 Chronic pain syndrome: Secondary | ICD-10-CM | POA: Diagnosis not present

## 2020-02-12 DIAGNOSIS — M94261 Chondromalacia, right knee: Secondary | ICD-10-CM | POA: Diagnosis not present

## 2020-02-12 DIAGNOSIS — R7303 Prediabetes: Secondary | ICD-10-CM | POA: Diagnosis not present

## 2020-02-12 DIAGNOSIS — M439 Deforming dorsopathy, unspecified: Secondary | ICD-10-CM | POA: Diagnosis not present

## 2020-02-12 DIAGNOSIS — I10 Essential (primary) hypertension: Secondary | ICD-10-CM | POA: Diagnosis not present

## 2020-02-12 DIAGNOSIS — M5441 Lumbago with sciatica, right side: Secondary | ICD-10-CM | POA: Diagnosis not present

## 2020-02-16 DIAGNOSIS — M5441 Lumbago with sciatica, right side: Secondary | ICD-10-CM | POA: Diagnosis not present

## 2020-02-16 DIAGNOSIS — G894 Chronic pain syndrome: Secondary | ICD-10-CM | POA: Diagnosis not present

## 2020-02-16 DIAGNOSIS — M797 Fibromyalgia: Secondary | ICD-10-CM | POA: Diagnosis not present

## 2020-02-16 DIAGNOSIS — R7303 Prediabetes: Secondary | ICD-10-CM | POA: Diagnosis not present

## 2020-02-16 DIAGNOSIS — I10 Essential (primary) hypertension: Secondary | ICD-10-CM | POA: Diagnosis not present

## 2020-02-16 DIAGNOSIS — M439 Deforming dorsopathy, unspecified: Secondary | ICD-10-CM | POA: Diagnosis not present

## 2020-02-16 DIAGNOSIS — M94261 Chondromalacia, right knee: Secondary | ICD-10-CM | POA: Diagnosis not present

## 2020-02-16 DIAGNOSIS — R69 Illness, unspecified: Secondary | ICD-10-CM | POA: Diagnosis not present

## 2020-02-16 DIAGNOSIS — N200 Calculus of kidney: Secondary | ICD-10-CM | POA: Diagnosis not present

## 2020-02-17 DIAGNOSIS — I10 Essential (primary) hypertension: Secondary | ICD-10-CM | POA: Diagnosis not present

## 2020-02-17 DIAGNOSIS — M797 Fibromyalgia: Secondary | ICD-10-CM | POA: Diagnosis not present

## 2020-02-17 DIAGNOSIS — M94261 Chondromalacia, right knee: Secondary | ICD-10-CM | POA: Diagnosis not present

## 2020-02-17 DIAGNOSIS — R69 Illness, unspecified: Secondary | ICD-10-CM | POA: Diagnosis not present

## 2020-02-17 DIAGNOSIS — G894 Chronic pain syndrome: Secondary | ICD-10-CM | POA: Diagnosis not present

## 2020-02-17 DIAGNOSIS — R7303 Prediabetes: Secondary | ICD-10-CM | POA: Diagnosis not present

## 2020-02-17 DIAGNOSIS — N200 Calculus of kidney: Secondary | ICD-10-CM | POA: Diagnosis not present

## 2020-02-17 DIAGNOSIS — M5441 Lumbago with sciatica, right side: Secondary | ICD-10-CM | POA: Diagnosis not present

## 2020-02-17 DIAGNOSIS — M439 Deforming dorsopathy, unspecified: Secondary | ICD-10-CM | POA: Diagnosis not present

## 2020-02-20 DIAGNOSIS — G894 Chronic pain syndrome: Secondary | ICD-10-CM | POA: Diagnosis not present

## 2020-02-20 DIAGNOSIS — R7303 Prediabetes: Secondary | ICD-10-CM | POA: Diagnosis not present

## 2020-02-20 DIAGNOSIS — M94261 Chondromalacia, right knee: Secondary | ICD-10-CM | POA: Diagnosis not present

## 2020-02-20 DIAGNOSIS — M439 Deforming dorsopathy, unspecified: Secondary | ICD-10-CM | POA: Diagnosis not present

## 2020-02-20 DIAGNOSIS — N200 Calculus of kidney: Secondary | ICD-10-CM | POA: Diagnosis not present

## 2020-02-20 DIAGNOSIS — M5441 Lumbago with sciatica, right side: Secondary | ICD-10-CM | POA: Diagnosis not present

## 2020-02-20 DIAGNOSIS — R69 Illness, unspecified: Secondary | ICD-10-CM | POA: Diagnosis not present

## 2020-02-20 DIAGNOSIS — M797 Fibromyalgia: Secondary | ICD-10-CM | POA: Diagnosis not present

## 2020-02-20 DIAGNOSIS — I10 Essential (primary) hypertension: Secondary | ICD-10-CM | POA: Diagnosis not present

## 2020-02-23 DIAGNOSIS — M25369 Other instability, unspecified knee: Secondary | ICD-10-CM | POA: Diagnosis not present

## 2020-02-23 DIAGNOSIS — Z20822 Contact with and (suspected) exposure to covid-19: Secondary | ICD-10-CM | POA: Diagnosis not present

## 2020-02-23 DIAGNOSIS — M545 Low back pain: Secondary | ICD-10-CM | POA: Diagnosis not present

## 2020-02-23 DIAGNOSIS — I10 Essential (primary) hypertension: Secondary | ICD-10-CM | POA: Diagnosis not present

## 2020-02-23 DIAGNOSIS — R7303 Prediabetes: Secondary | ICD-10-CM | POA: Diagnosis not present

## 2020-02-23 DIAGNOSIS — J329 Chronic sinusitis, unspecified: Secondary | ICD-10-CM | POA: Diagnosis not present

## 2020-02-23 DIAGNOSIS — K529 Noninfective gastroenteritis and colitis, unspecified: Secondary | ICD-10-CM | POA: Diagnosis not present

## 2020-02-23 DIAGNOSIS — E039 Hypothyroidism, unspecified: Secondary | ICD-10-CM | POA: Diagnosis not present

## 2020-02-23 DIAGNOSIS — M797 Fibromyalgia: Secondary | ICD-10-CM | POA: Diagnosis not present

## 2020-02-24 ENCOUNTER — Encounter: Payer: Self-pay | Admitting: Nurse Practitioner

## 2020-02-24 DIAGNOSIS — M439 Deforming dorsopathy, unspecified: Secondary | ICD-10-CM | POA: Diagnosis not present

## 2020-02-24 DIAGNOSIS — R7303 Prediabetes: Secondary | ICD-10-CM | POA: Diagnosis not present

## 2020-02-24 DIAGNOSIS — R69 Illness, unspecified: Secondary | ICD-10-CM | POA: Diagnosis not present

## 2020-02-24 DIAGNOSIS — G894 Chronic pain syndrome: Secondary | ICD-10-CM | POA: Diagnosis not present

## 2020-02-24 DIAGNOSIS — M797 Fibromyalgia: Secondary | ICD-10-CM | POA: Diagnosis not present

## 2020-02-24 DIAGNOSIS — I10 Essential (primary) hypertension: Secondary | ICD-10-CM | POA: Diagnosis not present

## 2020-02-24 DIAGNOSIS — M5441 Lumbago with sciatica, right side: Secondary | ICD-10-CM | POA: Diagnosis not present

## 2020-02-24 DIAGNOSIS — M94261 Chondromalacia, right knee: Secondary | ICD-10-CM | POA: Diagnosis not present

## 2020-02-24 DIAGNOSIS — N200 Calculus of kidney: Secondary | ICD-10-CM | POA: Diagnosis not present

## 2020-02-25 DIAGNOSIS — R7303 Prediabetes: Secondary | ICD-10-CM | POA: Diagnosis not present

## 2020-02-25 DIAGNOSIS — M5441 Lumbago with sciatica, right side: Secondary | ICD-10-CM | POA: Diagnosis not present

## 2020-02-25 DIAGNOSIS — N200 Calculus of kidney: Secondary | ICD-10-CM | POA: Diagnosis not present

## 2020-02-25 DIAGNOSIS — I10 Essential (primary) hypertension: Secondary | ICD-10-CM | POA: Diagnosis not present

## 2020-02-25 DIAGNOSIS — M1711 Unilateral primary osteoarthritis, right knee: Secondary | ICD-10-CM | POA: Diagnosis not present

## 2020-02-25 DIAGNOSIS — M94261 Chondromalacia, right knee: Secondary | ICD-10-CM | POA: Diagnosis not present

## 2020-02-25 DIAGNOSIS — M797 Fibromyalgia: Secondary | ICD-10-CM | POA: Diagnosis not present

## 2020-02-25 DIAGNOSIS — M439 Deforming dorsopathy, unspecified: Secondary | ICD-10-CM | POA: Diagnosis not present

## 2020-02-25 DIAGNOSIS — G894 Chronic pain syndrome: Secondary | ICD-10-CM | POA: Diagnosis not present

## 2020-02-25 DIAGNOSIS — R69 Illness, unspecified: Secondary | ICD-10-CM | POA: Diagnosis not present

## 2020-03-01 DIAGNOSIS — M797 Fibromyalgia: Secondary | ICD-10-CM | POA: Diagnosis not present

## 2020-03-01 DIAGNOSIS — N200 Calculus of kidney: Secondary | ICD-10-CM | POA: Diagnosis not present

## 2020-03-01 DIAGNOSIS — I10 Essential (primary) hypertension: Secondary | ICD-10-CM | POA: Diagnosis not present

## 2020-03-01 DIAGNOSIS — M5441 Lumbago with sciatica, right side: Secondary | ICD-10-CM | POA: Diagnosis not present

## 2020-03-01 DIAGNOSIS — M439 Deforming dorsopathy, unspecified: Secondary | ICD-10-CM | POA: Diagnosis not present

## 2020-03-01 DIAGNOSIS — G894 Chronic pain syndrome: Secondary | ICD-10-CM | POA: Diagnosis not present

## 2020-03-01 DIAGNOSIS — R69 Illness, unspecified: Secondary | ICD-10-CM | POA: Diagnosis not present

## 2020-03-01 DIAGNOSIS — M94261 Chondromalacia, right knee: Secondary | ICD-10-CM | POA: Diagnosis not present

## 2020-03-01 DIAGNOSIS — R7303 Prediabetes: Secondary | ICD-10-CM | POA: Diagnosis not present

## 2020-03-02 DIAGNOSIS — K529 Noninfective gastroenteritis and colitis, unspecified: Secondary | ICD-10-CM | POA: Diagnosis not present

## 2020-03-02 DIAGNOSIS — R7303 Prediabetes: Secondary | ICD-10-CM | POA: Diagnosis not present

## 2020-03-02 DIAGNOSIS — M439 Deforming dorsopathy, unspecified: Secondary | ICD-10-CM | POA: Diagnosis not present

## 2020-03-02 DIAGNOSIS — R69 Illness, unspecified: Secondary | ICD-10-CM | POA: Diagnosis not present

## 2020-03-02 DIAGNOSIS — I10 Essential (primary) hypertension: Secondary | ICD-10-CM | POA: Diagnosis not present

## 2020-03-02 DIAGNOSIS — G894 Chronic pain syndrome: Secondary | ICD-10-CM | POA: Diagnosis not present

## 2020-03-02 DIAGNOSIS — N200 Calculus of kidney: Secondary | ICD-10-CM | POA: Diagnosis not present

## 2020-03-02 DIAGNOSIS — M797 Fibromyalgia: Secondary | ICD-10-CM | POA: Diagnosis not present

## 2020-03-02 DIAGNOSIS — M5441 Lumbago with sciatica, right side: Secondary | ICD-10-CM | POA: Diagnosis not present

## 2020-03-02 DIAGNOSIS — M94261 Chondromalacia, right knee: Secondary | ICD-10-CM | POA: Diagnosis not present

## 2020-03-04 DIAGNOSIS — M94261 Chondromalacia, right knee: Secondary | ICD-10-CM | POA: Diagnosis not present

## 2020-03-04 DIAGNOSIS — N200 Calculus of kidney: Secondary | ICD-10-CM | POA: Diagnosis not present

## 2020-03-04 DIAGNOSIS — M439 Deforming dorsopathy, unspecified: Secondary | ICD-10-CM | POA: Diagnosis not present

## 2020-03-04 DIAGNOSIS — R69 Illness, unspecified: Secondary | ICD-10-CM | POA: Diagnosis not present

## 2020-03-04 DIAGNOSIS — I10 Essential (primary) hypertension: Secondary | ICD-10-CM | POA: Diagnosis not present

## 2020-03-04 DIAGNOSIS — M797 Fibromyalgia: Secondary | ICD-10-CM | POA: Diagnosis not present

## 2020-03-04 DIAGNOSIS — M5441 Lumbago with sciatica, right side: Secondary | ICD-10-CM | POA: Diagnosis not present

## 2020-03-04 DIAGNOSIS — R7303 Prediabetes: Secondary | ICD-10-CM | POA: Diagnosis not present

## 2020-03-04 DIAGNOSIS — G894 Chronic pain syndrome: Secondary | ICD-10-CM | POA: Diagnosis not present

## 2020-03-09 DIAGNOSIS — R69 Illness, unspecified: Secondary | ICD-10-CM | POA: Diagnosis not present

## 2020-03-09 DIAGNOSIS — M439 Deforming dorsopathy, unspecified: Secondary | ICD-10-CM | POA: Diagnosis not present

## 2020-03-09 DIAGNOSIS — G894 Chronic pain syndrome: Secondary | ICD-10-CM | POA: Diagnosis not present

## 2020-03-09 DIAGNOSIS — N200 Calculus of kidney: Secondary | ICD-10-CM | POA: Diagnosis not present

## 2020-03-09 DIAGNOSIS — M94261 Chondromalacia, right knee: Secondary | ICD-10-CM | POA: Diagnosis not present

## 2020-03-09 DIAGNOSIS — M797 Fibromyalgia: Secondary | ICD-10-CM | POA: Diagnosis not present

## 2020-03-09 DIAGNOSIS — I10 Essential (primary) hypertension: Secondary | ICD-10-CM | POA: Diagnosis not present

## 2020-03-09 DIAGNOSIS — M5441 Lumbago with sciatica, right side: Secondary | ICD-10-CM | POA: Diagnosis not present

## 2020-03-09 DIAGNOSIS — R7303 Prediabetes: Secondary | ICD-10-CM | POA: Diagnosis not present

## 2020-03-10 DIAGNOSIS — N200 Calculus of kidney: Secondary | ICD-10-CM | POA: Diagnosis not present

## 2020-03-10 DIAGNOSIS — M439 Deforming dorsopathy, unspecified: Secondary | ICD-10-CM | POA: Diagnosis not present

## 2020-03-10 DIAGNOSIS — M94261 Chondromalacia, right knee: Secondary | ICD-10-CM | POA: Diagnosis not present

## 2020-03-10 DIAGNOSIS — M797 Fibromyalgia: Secondary | ICD-10-CM | POA: Diagnosis not present

## 2020-03-10 DIAGNOSIS — G894 Chronic pain syndrome: Secondary | ICD-10-CM | POA: Diagnosis not present

## 2020-03-10 DIAGNOSIS — I10 Essential (primary) hypertension: Secondary | ICD-10-CM | POA: Diagnosis not present

## 2020-03-10 DIAGNOSIS — M5441 Lumbago with sciatica, right side: Secondary | ICD-10-CM | POA: Diagnosis not present

## 2020-03-10 DIAGNOSIS — R7303 Prediabetes: Secondary | ICD-10-CM | POA: Diagnosis not present

## 2020-03-10 DIAGNOSIS — R69 Illness, unspecified: Secondary | ICD-10-CM | POA: Diagnosis not present

## 2020-03-25 DIAGNOSIS — E1169 Type 2 diabetes mellitus with other specified complication: Secondary | ICD-10-CM | POA: Diagnosis not present

## 2020-03-25 DIAGNOSIS — E669 Obesity, unspecified: Secondary | ICD-10-CM | POA: Diagnosis not present

## 2020-03-29 NOTE — Progress Notes (Deleted)
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  Past cholecystectomy, hernia repair and partial hysterectomy.   Colonoscopy 07/27/2015 in [place], [place] showed hemorrhoids otherwise was normal.   CBC Latest Ref Rng & Units 11/04/2019 12/30/2018 12/19/2017  WBC 4.0 - 10.5 K/uL 9.1 10.0 8.9  Hemoglobin 12.0 - 15.0 g/dL 03.4 74.2 59.5  Hematocrit 36 - 46 % 42.6 42.0 38.7  Platelets 150 - 400 K/uL 344 386 -    CMP Latest Ref Rng & Units 11/04/2019 12/30/2018 12/19/2017  Glucose 70 - 99 mg/dL 638(V) 564(P) 88  BUN 6 - 20 mg/dL 5(L) 8 11  Creatinine 3.29 - 1.00 mg/dL 5.18 8.41(Y) 6.06(T)  Sodium 135 - 145 mmol/L 141 141 144  Potassium 3.5 - 5.1 mmol/L 3.2(L) 3.3(L) 4.0  Chloride 98 - 111 mmol/L 102 105 105  CO2 22 - 32 mmol/L 24 27 24   Calcium 8.9 - 10.3 mg/dL 9.1 9.2 9.6  Total Protein 6.5 - 8.1 g/dL - - -  Total Bilirubin 0.3 - 1.2 mg/dL - - -  Alkaline Phos 38 - 126 U/L - - -  AST 15 - 41 U/L - - -  ALT 14 - 54 U/L - - -    Past Medical History:  Diagnosis Date  . Allergy   . Anxiety   . Arthritis   . Asthma   . 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 Concussion   . DDD (degenerative disc disease), cervical   . Depression   . Fibromyalgia   . HIV (human immunodeficiency virus infection) (HCC)   . Homelessness   . Hypertension   . Hypothyroidism   . Insomnia   . Neuromuscular disorder (HCC)   . Thyroid disease   . TIA (transient ischemic attack)    Past Surgical History:  Procedure Laterality Date  . ABDOMINAL HYSTERECTOMY     Partial-ovaries and tubes remain  . BREAST BIOPSY Left   . CHOLECYSTECTOMY  2006  . COLONOSCOPY  07/27/2015   [place]  . HERNIA REPAIR  1997  . TUMOR EXCISION Right 1984    Sinuses  . TUMOR EXCISION Left 2015   sinuses  . URACHAL CYST EXCISION  1997   Social History:  Family History:   reports that she has been smoking cigarettes. She has a 9.90 pack-year smoking history. She has never used smokeless tobacco. She reports that she does not drink alcohol and does not use drugs. family history includes Alcohol abuse in her father; Breast cancer (age of onset: 55) in her mother; COPD in her brother; Diabetes in her mother; Headache in her mother; Heart disease in her brother, father, and mother; Heart failure in her brother and mother; Hyperlipidemia in her mother; Hypertension in her mother, sister, and sister; Mental illness in her brother and mother; Stroke in her mother. Allergies  Allergen Reactions  . Hydromorphone Itching  . Iodinated Diagnostic Agents Anaphylaxis, Swelling and Other (See Comments)    Patient stated this was a "one off" Patient complained of throat/ uvula swelling s/p injection on 02/01/2017 but states she isnt allergic to the dye and has had several times prior    . Sulfa Antibiotics Anaphylaxis  . Codeine Other (See Comments)    Patient said mother told her  she was allergic as a child  . Hydromorphone Hcl Itching  . Nickel Rash  . Penicillins Rash and Other (See Comments)    Told she was allergic from childhood: Has patient had a PCN reaction causing immediate rash, facial/tongue/throat swelling, SOB or lightheadedness with hypotension: Unk Has patient had a PCN reaction causing severe rash involving mucus membranes or skin necrosis: Unk Has patient had a PCN reaction that required hospitalization: Unk Has patient had a PCN reaction occurring within the last 10 years: No If all of the above answers are "NO", then may proceed with Cephalosporin use   . Spironolactone Nausea Only and Other (See Comments)    Caused GI isues  . Adhesive [Tape] Rash      Outpatient Encounter Medications as of 03/30/2020  Medication Sig  .  acetaminophen (TYLENOL) 325 MG tablet Take 650 mg by mouth every 6 (six) hours as needed (for pain).  Marland Kitchen acyclovir (ZOVIRAX) 200 MG capsule Take 1 capsule (200 mg total) by mouth 2 (two) times daily. For Outbreak: 2 capsules (400 mg) TID x 5 days. (Patient taking differently: Take 400 mg by mouth 2 (two) times daily. )  . albuterol (PROVENTIL HFA;VENTOLIN HFA) 108 (90 Base) MCG/ACT inhaler Inhale 1-2 puffs into the lungs every 6 (six) hours as needed for wheezing or shortness of breath.  Marland Kitchen amitriptyline (ELAVIL) 75 MG tablet Take 1 tablet (75 mg total) by mouth at bedtime.  Marland Kitchen amLODipine (NORVASC) 10 MG tablet Take 1 tablet (10 mg total) by mouth daily.  . ARIPiprazole (ABILIFY) 10 MG tablet Take 1 tablet (10 mg total) by mouth daily.  . ASPERCREME LIDOCAINE EX Apply 1 application topically 2 (two) times daily as needed (for muscle pain).  Marland Kitchen azelastine (ASTELIN) 0.1 % nasal spray USE 2 SPRAYS EACH NOSTRIL TWICE DAILY.  . baclofen (LIORESAL) 10 MG tablet TAKE 1 2 TO 1 (ONE HALF TO ONE) TABLET BY MOUTH AS NEEDED TO TWICE DAILY FOR HEADACHE. LIMIT TO 2 HEADACHE DAYS PER WEEK. AVOID DAILY USE.  . budesonide-formoterol (SYMBICORT) 160-4.5 MCG/ACT inhaler Inhale 2 puffs into the lungs 2 (two) times daily.  . clobetasol ointment (TEMOVATE) 0.05 % APPLY  OINTMENT TOPICALLY TWICE DAILY. USE  IN  PLACE  OF  TRIAMCINOLONE  . diazepam (VALIUM) 5 MG tablet Take 1 tablet (5 mg total) by mouth every 8 (eight) hours as needed for anxiety.  . diphenhydrAMINE (BENADRYL) 25 mg capsule Take 25 mg by mouth 2 (two) times daily.  . fluticasone (FLONASE) 50 MCG/ACT nasal spray USE 1 SPRAY IN EACH NOSTRIL DAILY.  . furosemide (LASIX) 40 MG tablet Take 1 tablet (40 mg total) by mouth 2 (two) times daily. (Patient taking differently: Take 80 mg by mouth at bedtime. )  . glucose blood test strip Use as instructed  . hydrOXYzine (ATARAX/VISTARIL) 10 MG tablet Take 1 tablet (10 mg total) by mouth at bedtime. (Patient taking  differently: Take 10 mg by mouth at bedtime as needed (itching or angioedema). )  . Incontinence Supply Disposable (PREVAIL WASHCLOTHS) MISC Use daily as directed  . ipratropium-albuterol (DUONEB) 0.5-2.5 (3) MG/3ML SOLN Take 3 mLs by nebulization every 6 (six) hours as needed (for breathing). (Patient taking differently: Take 3 mLs by nebulization every 6 (six) hours as needed (for shortness of breath or wheezing). )  . ketorolac (ACULAR) 0.4 % SOLN Place one drop into both eyes 4 (four) times daily.  . Lancets Thin MISC Use to check home glucose daily  . levothyroxine (SYNTHROID, LEVOTHROID)  50 MCG tablet Take 1 tablet (50 mcg total) by mouth daily before breakfast. (Patient taking differently: Take 50 mcg by mouth daily. )  . losartan (COZAAR) 100 MG tablet Take 1 tablet (100 mg total) by mouth daily.  . meloxicam (MOBIC) 15 MG tablet TAKE 1 TABLET EACH DAY.  . metFORMIN (GLUCOPHAGE-XR) 500 MG 24 hr tablet Take by mouth.  . methocarbamol (ROBAXIN) 500 MG tablet Take 2 tablets (1,000 mg total) by mouth every 8 (eight) hours as needed for muscle spasms.  . metoprolol tartrate (LOPRESSOR) 100 MG tablet Take 1 tablet (100 mg total) by mouth 2 (two) times daily.  . naloxone (NARCAN) nasal spray 4 mg/0.1 mL Place into the nose.  Celedonio Miyamoto Stimulator (PRO COMFORT TENS UNIT) DEVI 1 Device by Does not apply route daily as needed.  . nystatin cream (MYCOSTATIN) Apply 1 application topically 2 (two) times daily.  . ondansetron (ZOFRAN ODT) 4 MG disintegrating tablet Take 1 tablet (4 mg total) by mouth every 8 (eight) hours as needed for nausea or vomiting.  Marland Kitchen oxyCODONE (OXY IR/ROXICODONE) 5 MG immediate release tablet Take 1 tablet (5 mg total) by mouth every 4 (four) hours as needed for severe pain.  . potassium chloride SA (K-DUR,KLOR-CON) 20 MEQ tablet Take 1 tablet (20 mEq total) by mouth 2 (two) times daily.  . sertraline (ZOLOFT) 100 MG tablet Take 100 mg by mouth daily.  . traMADol (ULTRAM) 50 MG  tablet Take 1 tablet (50 mg total) by mouth 3 (three) times daily as needed. (Patient taking differently: Take 50 mg by mouth at bedtime. )  . triamcinolone ointment (KENALOG) 0.5 % Apply 1 application topically 2 (two) times daily.  . vitamin B-12 (CYANOCOBALAMIN) 100 MCG tablet Take 100 mcg by mouth daily.  Marland Kitchen zonisamide (ZONEGRAN) 25 MG capsule TAKE 1 CAPSULE BY MOUTH AT BEDTIME FOR 7 DAYS THEN INCREASE BY 1 CAPSULE BY MOUTH WEEKLY UNTIL TAKING 4 CAPSULES BY MOUTH AT BEDTIME.   No facility-administered encounter medications on file as of 03/30/2020.     REVIEW OF SYSTEMS: All other systems reviewed and negative except where noted in the History of Present Illness.  Gen: Denies fever, sweats or chills. No weight loss.  CV: Denies chest pain, palpitations or edema. Resp: Denies cough, shortness of breath of hemoptysis.  GI: Denies heartburn, dysphagia, stomach or lower abdominal pain. No diarrhea or constipation.  GU : Denies urinary burning, blood in urine, increased urinary frequency or incontinence. MS: Denies joint pain, muscles aches or weakness. Derm: Denies rash, itchiness, skin lesions or unhealing ulcers. Psych: Denies depression, anxiety, memory loss, suicidal ideation and confusion. Heme: Denies bruising, bleeding. Neuro:  Denies headaches, dizziness or paresthesias. Endo:  Denies any problems with DM, thyroid or adrenal function.    PHYSICAL EXAM: There were no vitals taken for this visit. General: Well developed ... in no acute distress. Head: Normocephalic and atraumatic. Eyes:  Sclerae non-icteric, conjunctive pink. Ears: Normal auditory acuity. Mouth: Dentition intact. No ulcers or lesions.  Neck: Supple, no lymphadenopathy or thyromegaly.  Lungs: Clear bilaterally to auscultation without wheezes, crackles or rhonchi. Heart: Regular rate and rhythm. No murmur, rub or gallop appreciated.  Abdomen: Soft, nontender, non distended. No masses. No hepatosplenomegaly.  Normoactive bowel sounds x 4 quadrants.  Rectal:  Musculoskeletal: Symmetrical with no gross deformities. Skin: Warm and dry. No rash or lesions on visible extremities. Extremities: No edema. Neurological: Alert oriented x 4, no focal deficits.  Psychological:  Alert and cooperative. Normal mood and  affect.  ASSESSMENT AND PLAN:    CC:  Porfirio OarJeffery, [place], PA

## 2020-03-30 ENCOUNTER — Ambulatory Visit: Payer: Medicare HMO | Admitting: Nurse Practitioner

## 2020-04-08 DIAGNOSIS — E669 Obesity, unspecified: Secondary | ICD-10-CM | POA: Diagnosis not present

## 2020-04-08 DIAGNOSIS — E1169 Type 2 diabetes mellitus with other specified complication: Secondary | ICD-10-CM | POA: Diagnosis not present

## 2020-04-12 DIAGNOSIS — R69 Illness, unspecified: Secondary | ICD-10-CM | POA: Diagnosis not present

## 2020-04-15 DIAGNOSIS — R69 Illness, unspecified: Secondary | ICD-10-CM | POA: Diagnosis not present

## 2020-04-25 DIAGNOSIS — E1169 Type 2 diabetes mellitus with other specified complication: Secondary | ICD-10-CM | POA: Diagnosis not present

## 2020-04-25 DIAGNOSIS — R5382 Chronic fatigue, unspecified: Secondary | ICD-10-CM | POA: Diagnosis not present

## 2020-04-25 DIAGNOSIS — Z23 Encounter for immunization: Secondary | ICD-10-CM | POA: Diagnosis not present

## 2020-04-25 DIAGNOSIS — M654 Radial styloid tenosynovitis [de Quervain]: Secondary | ICD-10-CM | POA: Diagnosis not present

## 2020-04-25 DIAGNOSIS — M797 Fibromyalgia: Secondary | ICD-10-CM | POA: Diagnosis not present

## 2020-04-25 DIAGNOSIS — E669 Obesity, unspecified: Secondary | ICD-10-CM | POA: Diagnosis not present

## 2020-04-25 DIAGNOSIS — E1159 Type 2 diabetes mellitus with other circulatory complications: Secondary | ICD-10-CM | POA: Diagnosis not present

## 2020-04-25 DIAGNOSIS — E039 Hypothyroidism, unspecified: Secondary | ICD-10-CM | POA: Diagnosis not present

## 2020-04-25 DIAGNOSIS — Z0000 Encounter for general adult medical examination without abnormal findings: Secondary | ICD-10-CM | POA: Diagnosis not present

## 2020-04-25 DIAGNOSIS — I152 Hypertension secondary to endocrine disorders: Secondary | ICD-10-CM | POA: Diagnosis not present

## 2020-04-25 DIAGNOSIS — Z6841 Body Mass Index (BMI) 40.0 and over, adult: Secondary | ICD-10-CM | POA: Diagnosis not present

## 2020-04-25 DIAGNOSIS — I10 Essential (primary) hypertension: Secondary | ICD-10-CM | POA: Diagnosis not present

## 2020-04-25 DIAGNOSIS — Z79891 Long term (current) use of opiate analgesic: Secondary | ICD-10-CM | POA: Diagnosis not present

## 2020-04-29 DIAGNOSIS — E669 Obesity, unspecified: Secondary | ICD-10-CM | POA: Diagnosis not present

## 2020-04-29 DIAGNOSIS — G4733 Obstructive sleep apnea (adult) (pediatric): Secondary | ICD-10-CM | POA: Diagnosis not present

## 2020-04-29 DIAGNOSIS — E1169 Type 2 diabetes mellitus with other specified complication: Secondary | ICD-10-CM | POA: Diagnosis not present

## 2020-05-04 DIAGNOSIS — G4733 Obstructive sleep apnea (adult) (pediatric): Secondary | ICD-10-CM | POA: Diagnosis not present

## 2020-05-12 DIAGNOSIS — Z6841 Body Mass Index (BMI) 40.0 and over, adult: Secondary | ICD-10-CM | POA: Diagnosis not present

## 2020-05-12 DIAGNOSIS — E039 Hypothyroidism, unspecified: Secondary | ICD-10-CM | POA: Diagnosis not present

## 2020-05-12 DIAGNOSIS — J449 Chronic obstructive pulmonary disease, unspecified: Secondary | ICD-10-CM | POA: Diagnosis not present

## 2020-05-12 DIAGNOSIS — B009 Herpesviral infection, unspecified: Secondary | ICD-10-CM | POA: Diagnosis not present

## 2020-05-12 DIAGNOSIS — D8481 Immunodeficiency due to conditions classified elsewhere: Secondary | ICD-10-CM | POA: Diagnosis not present

## 2020-05-12 DIAGNOSIS — R69 Illness, unspecified: Secondary | ICD-10-CM | POA: Diagnosis not present

## 2020-05-12 DIAGNOSIS — Z008 Encounter for other general examination: Secondary | ICD-10-CM | POA: Diagnosis not present

## 2020-05-12 DIAGNOSIS — E1151 Type 2 diabetes mellitus with diabetic peripheral angiopathy without gangrene: Secondary | ICD-10-CM | POA: Diagnosis not present

## 2020-05-12 DIAGNOSIS — E11620 Type 2 diabetes mellitus with diabetic dermatitis: Secondary | ICD-10-CM | POA: Diagnosis not present

## 2020-05-27 DIAGNOSIS — E669 Obesity, unspecified: Secondary | ICD-10-CM | POA: Diagnosis not present

## 2020-05-27 DIAGNOSIS — E1169 Type 2 diabetes mellitus with other specified complication: Secondary | ICD-10-CM | POA: Diagnosis not present

## 2020-05-30 DIAGNOSIS — J329 Chronic sinusitis, unspecified: Secondary | ICD-10-CM | POA: Diagnosis not present

## 2020-05-30 DIAGNOSIS — L509 Urticaria, unspecified: Secondary | ICD-10-CM | POA: Diagnosis not present

## 2020-05-30 DIAGNOSIS — J309 Allergic rhinitis, unspecified: Secondary | ICD-10-CM | POA: Diagnosis not present

## 2020-07-01 DIAGNOSIS — E669 Obesity, unspecified: Secondary | ICD-10-CM | POA: Diagnosis not present

## 2020-07-01 DIAGNOSIS — E1169 Type 2 diabetes mellitus with other specified complication: Secondary | ICD-10-CM | POA: Diagnosis not present

## 2020-08-02 DIAGNOSIS — E669 Obesity, unspecified: Secondary | ICD-10-CM | POA: Diagnosis not present

## 2020-08-02 DIAGNOSIS — E1169 Type 2 diabetes mellitus with other specified complication: Secondary | ICD-10-CM | POA: Diagnosis not present

## 2020-08-04 DIAGNOSIS — E1159 Type 2 diabetes mellitus with other circulatory complications: Secondary | ICD-10-CM | POA: Diagnosis not present

## 2020-08-04 DIAGNOSIS — Z6841 Body Mass Index (BMI) 40.0 and over, adult: Secondary | ICD-10-CM | POA: Diagnosis not present

## 2020-08-04 DIAGNOSIS — F332 Major depressive disorder, recurrent severe without psychotic features: Secondary | ICD-10-CM | POA: Diagnosis not present

## 2020-08-04 DIAGNOSIS — E1169 Type 2 diabetes mellitus with other specified complication: Secondary | ICD-10-CM | POA: Diagnosis not present

## 2020-08-04 DIAGNOSIS — B333 Retrovirus infections, not elsewhere classified: Secondary | ICD-10-CM | POA: Diagnosis not present

## 2020-08-04 DIAGNOSIS — K589 Irritable bowel syndrome without diarrhea: Secondary | ICD-10-CM | POA: Diagnosis not present

## 2020-08-04 DIAGNOSIS — R5382 Chronic fatigue, unspecified: Secondary | ICD-10-CM | POA: Diagnosis not present

## 2020-08-04 DIAGNOSIS — G43909 Migraine, unspecified, not intractable, without status migrainosus: Secondary | ICD-10-CM | POA: Diagnosis not present

## 2020-08-04 DIAGNOSIS — M5030 Other cervical disc degeneration, unspecified cervical region: Secondary | ICD-10-CM | POA: Diagnosis not present

## 2020-08-04 DIAGNOSIS — M797 Fibromyalgia: Secondary | ICD-10-CM | POA: Diagnosis not present

## 2020-08-04 DIAGNOSIS — R69 Illness, unspecified: Secondary | ICD-10-CM | POA: Diagnosis not present

## 2020-08-26 DIAGNOSIS — E1169 Type 2 diabetes mellitus with other specified complication: Secondary | ICD-10-CM | POA: Diagnosis not present

## 2020-08-26 DIAGNOSIS — M797 Fibromyalgia: Secondary | ICD-10-CM | POA: Diagnosis not present

## 2020-08-26 DIAGNOSIS — E669 Obesity, unspecified: Secondary | ICD-10-CM | POA: Diagnosis not present

## 2020-08-26 DIAGNOSIS — R69 Illness, unspecified: Secondary | ICD-10-CM | POA: Diagnosis not present

## 2020-09-12 DIAGNOSIS — R69 Illness, unspecified: Secondary | ICD-10-CM | POA: Diagnosis not present

## 2020-09-19 DIAGNOSIS — R69 Illness, unspecified: Secondary | ICD-10-CM | POA: Diagnosis not present

## 2020-09-27 DIAGNOSIS — Z0000 Encounter for general adult medical examination without abnormal findings: Secondary | ICD-10-CM | POA: Diagnosis not present

## 2020-09-27 DIAGNOSIS — E1169 Type 2 diabetes mellitus with other specified complication: Secondary | ICD-10-CM | POA: Diagnosis not present

## 2020-09-27 DIAGNOSIS — E669 Obesity, unspecified: Secondary | ICD-10-CM | POA: Diagnosis not present

## 2020-09-27 DIAGNOSIS — G894 Chronic pain syndrome: Secondary | ICD-10-CM | POA: Diagnosis not present

## 2020-09-27 DIAGNOSIS — E1159 Type 2 diabetes mellitus with other circulatory complications: Secondary | ICD-10-CM | POA: Diagnosis not present

## 2020-09-27 DIAGNOSIS — I739 Peripheral vascular disease, unspecified: Secondary | ICD-10-CM | POA: Diagnosis not present

## 2020-09-27 DIAGNOSIS — R69 Illness, unspecified: Secondary | ICD-10-CM | POA: Diagnosis not present

## 2020-09-27 DIAGNOSIS — F332 Major depressive disorder, recurrent severe without psychotic features: Secondary | ICD-10-CM | POA: Diagnosis not present

## 2020-09-27 DIAGNOSIS — J4520 Mild intermittent asthma, uncomplicated: Secondary | ICD-10-CM | POA: Diagnosis not present

## 2020-09-27 DIAGNOSIS — I10 Essential (primary) hypertension: Secondary | ICD-10-CM | POA: Diagnosis not present

## 2020-09-27 DIAGNOSIS — E039 Hypothyroidism, unspecified: Secondary | ICD-10-CM | POA: Diagnosis not present

## 2020-09-29 DIAGNOSIS — Z79891 Long term (current) use of opiate analgesic: Secondary | ICD-10-CM | POA: Diagnosis not present

## 2020-09-29 DIAGNOSIS — G894 Chronic pain syndrome: Secondary | ICD-10-CM | POA: Diagnosis not present

## 2020-09-29 DIAGNOSIS — R69 Illness, unspecified: Secondary | ICD-10-CM | POA: Diagnosis not present

## 2020-09-29 DIAGNOSIS — E669 Obesity, unspecified: Secondary | ICD-10-CM | POA: Diagnosis not present

## 2020-09-29 DIAGNOSIS — I739 Peripheral vascular disease, unspecified: Secondary | ICD-10-CM | POA: Diagnosis not present

## 2020-09-29 DIAGNOSIS — J4520 Mild intermittent asthma, uncomplicated: Secondary | ICD-10-CM | POA: Diagnosis not present

## 2020-09-29 DIAGNOSIS — Z0000 Encounter for general adult medical examination without abnormal findings: Secondary | ICD-10-CM | POA: Diagnosis not present

## 2020-09-29 DIAGNOSIS — E039 Hypothyroidism, unspecified: Secondary | ICD-10-CM | POA: Diagnosis not present

## 2020-09-29 DIAGNOSIS — I10 Essential (primary) hypertension: Secondary | ICD-10-CM | POA: Diagnosis not present

## 2020-09-29 DIAGNOSIS — E1169 Type 2 diabetes mellitus with other specified complication: Secondary | ICD-10-CM | POA: Diagnosis not present

## 2020-09-29 DIAGNOSIS — E1159 Type 2 diabetes mellitus with other circulatory complications: Secondary | ICD-10-CM | POA: Diagnosis not present

## 2020-09-29 DIAGNOSIS — M797 Fibromyalgia: Secondary | ICD-10-CM | POA: Diagnosis not present

## 2020-09-29 DIAGNOSIS — I152 Hypertension secondary to endocrine disorders: Secondary | ICD-10-CM | POA: Diagnosis not present

## 2020-09-30 DIAGNOSIS — E669 Obesity, unspecified: Secondary | ICD-10-CM | POA: Diagnosis not present

## 2020-09-30 DIAGNOSIS — E1169 Type 2 diabetes mellitus with other specified complication: Secondary | ICD-10-CM | POA: Diagnosis not present

## 2020-10-18 DIAGNOSIS — R69 Illness, unspecified: Secondary | ICD-10-CM | POA: Diagnosis not present

## 2020-10-28 DIAGNOSIS — E1169 Type 2 diabetes mellitus with other specified complication: Secondary | ICD-10-CM | POA: Diagnosis not present

## 2020-10-28 DIAGNOSIS — E669 Obesity, unspecified: Secondary | ICD-10-CM | POA: Diagnosis not present

## 2020-11-03 DIAGNOSIS — E1169 Type 2 diabetes mellitus with other specified complication: Secondary | ICD-10-CM | POA: Diagnosis not present

## 2020-11-03 DIAGNOSIS — E669 Obesity, unspecified: Secondary | ICD-10-CM | POA: Diagnosis not present

## 2020-11-16 DIAGNOSIS — R69 Illness, unspecified: Secondary | ICD-10-CM | POA: Diagnosis not present

## 2020-11-17 DIAGNOSIS — R69 Illness, unspecified: Secondary | ICD-10-CM | POA: Diagnosis not present

## 2020-11-18 DIAGNOSIS — E669 Obesity, unspecified: Secondary | ICD-10-CM | POA: Diagnosis not present

## 2020-11-18 DIAGNOSIS — E1169 Type 2 diabetes mellitus with other specified complication: Secondary | ICD-10-CM | POA: Diagnosis not present

## 2020-11-28 DIAGNOSIS — M545 Low back pain, unspecified: Secondary | ICD-10-CM | POA: Diagnosis not present

## 2020-11-28 DIAGNOSIS — Z79891 Long term (current) use of opiate analgesic: Secondary | ICD-10-CM | POA: Diagnosis not present

## 2020-11-28 DIAGNOSIS — Z7982 Long term (current) use of aspirin: Secondary | ICD-10-CM | POA: Diagnosis not present

## 2020-11-28 DIAGNOSIS — M797 Fibromyalgia: Secondary | ICD-10-CM | POA: Diagnosis not present

## 2020-11-28 DIAGNOSIS — Z7951 Long term (current) use of inhaled steroids: Secondary | ICD-10-CM | POA: Diagnosis not present

## 2020-11-28 DIAGNOSIS — R69 Illness, unspecified: Secondary | ICD-10-CM | POA: Diagnosis not present

## 2020-11-28 DIAGNOSIS — G894 Chronic pain syndrome: Secondary | ICD-10-CM | POA: Diagnosis not present

## 2020-11-30 DIAGNOSIS — R32 Unspecified urinary incontinence: Secondary | ICD-10-CM | POA: Diagnosis not present

## 2020-11-30 DIAGNOSIS — M79604 Pain in right leg: Secondary | ICD-10-CM | POA: Diagnosis not present

## 2020-12-01 DIAGNOSIS — M79604 Pain in right leg: Secondary | ICD-10-CM | POA: Diagnosis not present

## 2020-12-02 DIAGNOSIS — E669 Obesity, unspecified: Secondary | ICD-10-CM | POA: Diagnosis not present

## 2020-12-02 DIAGNOSIS — R69 Illness, unspecified: Secondary | ICD-10-CM | POA: Diagnosis not present

## 2020-12-02 DIAGNOSIS — E1169 Type 2 diabetes mellitus with other specified complication: Secondary | ICD-10-CM | POA: Diagnosis not present

## 2020-12-06 DIAGNOSIS — R399 Unspecified symptoms and signs involving the genitourinary system: Secondary | ICD-10-CM | POA: Diagnosis not present

## 2020-12-06 DIAGNOSIS — R6889 Other general symptoms and signs: Secondary | ICD-10-CM | POA: Diagnosis not present

## 2020-12-06 DIAGNOSIS — I10 Essential (primary) hypertension: Secondary | ICD-10-CM | POA: Diagnosis not present

## 2020-12-06 DIAGNOSIS — M25551 Pain in right hip: Secondary | ICD-10-CM | POA: Diagnosis not present

## 2020-12-09 DIAGNOSIS — R69 Illness, unspecified: Secondary | ICD-10-CM | POA: Diagnosis not present

## 2020-12-15 DIAGNOSIS — M797 Fibromyalgia: Secondary | ICD-10-CM | POA: Diagnosis not present

## 2020-12-15 DIAGNOSIS — R69 Illness, unspecified: Secondary | ICD-10-CM | POA: Diagnosis not present

## 2020-12-15 DIAGNOSIS — Z7982 Long term (current) use of aspirin: Secondary | ICD-10-CM | POA: Diagnosis not present

## 2020-12-15 DIAGNOSIS — Z79891 Long term (current) use of opiate analgesic: Secondary | ICD-10-CM | POA: Diagnosis not present

## 2020-12-15 DIAGNOSIS — Z7951 Long term (current) use of inhaled steroids: Secondary | ICD-10-CM | POA: Diagnosis not present

## 2020-12-15 DIAGNOSIS — G894 Chronic pain syndrome: Secondary | ICD-10-CM | POA: Diagnosis not present

## 2020-12-15 DIAGNOSIS — M545 Low back pain, unspecified: Secondary | ICD-10-CM | POA: Diagnosis not present

## 2020-12-20 DIAGNOSIS — R69 Illness, unspecified: Secondary | ICD-10-CM | POA: Diagnosis not present

## 2020-12-20 DIAGNOSIS — Z7982 Long term (current) use of aspirin: Secondary | ICD-10-CM | POA: Diagnosis not present

## 2020-12-20 DIAGNOSIS — G894 Chronic pain syndrome: Secondary | ICD-10-CM | POA: Diagnosis not present

## 2020-12-20 DIAGNOSIS — M545 Low back pain, unspecified: Secondary | ICD-10-CM | POA: Diagnosis not present

## 2020-12-20 DIAGNOSIS — M797 Fibromyalgia: Secondary | ICD-10-CM | POA: Diagnosis not present

## 2020-12-20 DIAGNOSIS — Z7951 Long term (current) use of inhaled steroids: Secondary | ICD-10-CM | POA: Diagnosis not present

## 2020-12-20 DIAGNOSIS — Z79891 Long term (current) use of opiate analgesic: Secondary | ICD-10-CM | POA: Diagnosis not present

## 2020-12-25 DIAGNOSIS — Z20822 Contact with and (suspected) exposure to covid-19: Secondary | ICD-10-CM | POA: Diagnosis not present

## 2020-12-27 DIAGNOSIS — R69 Illness, unspecified: Secondary | ICD-10-CM | POA: Diagnosis not present

## 2021-01-06 DIAGNOSIS — I10 Essential (primary) hypertension: Secondary | ICD-10-CM | POA: Diagnosis not present

## 2021-01-06 DIAGNOSIS — R339 Retention of urine, unspecified: Secondary | ICD-10-CM | POA: Diagnosis not present

## 2021-01-06 DIAGNOSIS — R300 Dysuria: Secondary | ICD-10-CM | POA: Diagnosis not present

## 2021-01-06 DIAGNOSIS — R109 Unspecified abdominal pain: Secondary | ICD-10-CM | POA: Diagnosis not present

## 2021-01-10 DIAGNOSIS — R69 Illness, unspecified: Secondary | ICD-10-CM | POA: Diagnosis not present

## 2021-01-10 DIAGNOSIS — E039 Hypothyroidism, unspecified: Secondary | ICD-10-CM | POA: Diagnosis not present

## 2021-01-10 DIAGNOSIS — I739 Peripheral vascular disease, unspecified: Secondary | ICD-10-CM | POA: Diagnosis not present

## 2021-01-10 DIAGNOSIS — I509 Heart failure, unspecified: Secondary | ICD-10-CM | POA: Diagnosis not present

## 2021-01-10 DIAGNOSIS — I110 Hypertensive heart disease with heart failure: Secondary | ICD-10-CM | POA: Diagnosis not present

## 2021-01-10 DIAGNOSIS — B009 Herpesviral infection, unspecified: Secondary | ICD-10-CM | POA: Diagnosis not present

## 2021-01-10 DIAGNOSIS — Z6841 Body Mass Index (BMI) 40.0 and over, adult: Secondary | ICD-10-CM | POA: Diagnosis not present

## 2021-01-10 DIAGNOSIS — E261 Secondary hyperaldosteronism: Secondary | ICD-10-CM | POA: Diagnosis not present

## 2021-02-21 DIAGNOSIS — R69 Illness, unspecified: Secondary | ICD-10-CM | POA: Diagnosis not present

## 2021-02-21 DIAGNOSIS — F341 Dysthymic disorder: Secondary | ICD-10-CM | POA: Diagnosis not present

## 2021-02-24 DIAGNOSIS — I10 Essential (primary) hypertension: Secondary | ICD-10-CM | POA: Diagnosis not present

## 2021-02-24 DIAGNOSIS — I152 Hypertension secondary to endocrine disorders: Secondary | ICD-10-CM | POA: Diagnosis not present

## 2021-02-24 DIAGNOSIS — G894 Chronic pain syndrome: Secondary | ICD-10-CM | POA: Diagnosis not present

## 2021-02-24 DIAGNOSIS — E1159 Type 2 diabetes mellitus with other circulatory complications: Secondary | ICD-10-CM | POA: Diagnosis not present

## 2021-02-24 DIAGNOSIS — F321 Major depressive disorder, single episode, moderate: Secondary | ICD-10-CM | POA: Diagnosis not present

## 2021-02-24 DIAGNOSIS — E039 Hypothyroidism, unspecified: Secondary | ICD-10-CM | POA: Diagnosis not present

## 2021-02-24 DIAGNOSIS — R69 Illness, unspecified: Secondary | ICD-10-CM | POA: Diagnosis not present

## 2021-02-24 DIAGNOSIS — R339 Retention of urine, unspecified: Secondary | ICD-10-CM | POA: Diagnosis not present

## 2021-02-28 DIAGNOSIS — R69 Illness, unspecified: Secondary | ICD-10-CM | POA: Diagnosis not present

## 2021-02-28 DIAGNOSIS — F341 Dysthymic disorder: Secondary | ICD-10-CM | POA: Diagnosis not present

## 2021-04-04 DIAGNOSIS — F341 Dysthymic disorder: Secondary | ICD-10-CM | POA: Diagnosis not present

## 2021-04-04 DIAGNOSIS — R69 Illness, unspecified: Secondary | ICD-10-CM | POA: Diagnosis not present

## 2021-04-06 DIAGNOSIS — R69 Illness, unspecified: Secondary | ICD-10-CM | POA: Diagnosis not present

## 2021-04-08 DIAGNOSIS — J019 Acute sinusitis, unspecified: Secondary | ICD-10-CM | POA: Diagnosis not present

## 2021-04-08 DIAGNOSIS — B9689 Other specified bacterial agents as the cause of diseases classified elsewhere: Secondary | ICD-10-CM | POA: Diagnosis not present

## 2021-04-08 DIAGNOSIS — K1120 Sialoadenitis, unspecified: Secondary | ICD-10-CM | POA: Diagnosis not present

## 2021-04-10 DIAGNOSIS — F341 Dysthymic disorder: Secondary | ICD-10-CM | POA: Diagnosis not present

## 2021-04-10 DIAGNOSIS — R69 Illness, unspecified: Secondary | ICD-10-CM | POA: Diagnosis not present

## 2021-04-11 DIAGNOSIS — E119 Type 2 diabetes mellitus without complications: Secondary | ICD-10-CM | POA: Diagnosis not present

## 2021-04-17 DIAGNOSIS — F341 Dysthymic disorder: Secondary | ICD-10-CM | POA: Diagnosis not present

## 2021-04-17 DIAGNOSIS — R69 Illness, unspecified: Secondary | ICD-10-CM | POA: Diagnosis not present

## 2021-04-20 DIAGNOSIS — R69 Illness, unspecified: Secondary | ICD-10-CM | POA: Diagnosis not present

## 2021-04-20 DIAGNOSIS — F341 Dysthymic disorder: Secondary | ICD-10-CM | POA: Diagnosis not present

## 2021-04-21 ENCOUNTER — Other Ambulatory Visit: Payer: Self-pay

## 2021-04-21 ENCOUNTER — Emergency Department (HOSPITAL_COMMUNITY)
Admission: EM | Admit: 2021-04-21 | Discharge: 2021-04-22 | Disposition: A | Discharge status: Home / Self Care | Payer: Medicare HMO | Attending: Emergency Medicine | Admitting: Emergency Medicine

## 2021-04-21 ENCOUNTER — Encounter (HOSPITAL_COMMUNITY): Payer: Self-pay

## 2021-04-21 DIAGNOSIS — Z79899 Other long term (current) drug therapy: Secondary | ICD-10-CM | POA: Insufficient documentation

## 2021-04-21 DIAGNOSIS — E039 Hypothyroidism, unspecified: Secondary | ICD-10-CM | POA: Diagnosis not present

## 2021-04-21 DIAGNOSIS — R69 Illness, unspecified: Secondary | ICD-10-CM | POA: Diagnosis not present

## 2021-04-21 DIAGNOSIS — I10 Essential (primary) hypertension: Secondary | ICD-10-CM | POA: Diagnosis not present

## 2021-04-21 DIAGNOSIS — F17210 Nicotine dependence, cigarettes, uncomplicated: Secondary | ICD-10-CM | POA: Diagnosis not present

## 2021-04-21 DIAGNOSIS — Z21 Asymptomatic human immunodeficiency virus [HIV] infection status: Secondary | ICD-10-CM | POA: Insufficient documentation

## 2021-04-21 DIAGNOSIS — J45909 Unspecified asthma, uncomplicated: Secondary | ICD-10-CM | POA: Diagnosis not present

## 2021-04-21 DIAGNOSIS — Z7951 Long term (current) use of inhaled steroids: Secondary | ICD-10-CM | POA: Insufficient documentation

## 2021-04-21 DIAGNOSIS — M542 Cervicalgia: Secondary | ICD-10-CM | POA: Diagnosis not present

## 2021-04-21 DIAGNOSIS — R220 Localized swelling, mass and lump, head: Secondary | ICD-10-CM | POA: Diagnosis not present

## 2021-04-21 DIAGNOSIS — Z743 Need for continuous supervision: Secondary | ICD-10-CM | POA: Diagnosis not present

## 2021-04-21 DIAGNOSIS — R221 Localized swelling, mass and lump, neck: Secondary | ICD-10-CM | POA: Diagnosis not present

## 2021-04-21 DIAGNOSIS — K118 Other diseases of salivary glands: Secondary | ICD-10-CM | POA: Diagnosis not present

## 2021-04-21 DIAGNOSIS — Z7984 Long term (current) use of oral hypoglycemic drugs: Secondary | ICD-10-CM | POA: Diagnosis not present

## 2021-04-21 DIAGNOSIS — R609 Edema, unspecified: Secondary | ICD-10-CM | POA: Diagnosis not present

## 2021-04-21 DIAGNOSIS — M47812 Spondylosis without myelopathy or radiculopathy, cervical region: Secondary | ICD-10-CM | POA: Diagnosis not present

## 2021-04-21 DIAGNOSIS — J320 Chronic maxillary sinusitis: Secondary | ICD-10-CM | POA: Diagnosis not present

## 2021-04-21 LAB — COMPREHENSIVE METABOLIC PANEL
ALT: 28 U/L (ref 0–44)
AST: 24 U/L (ref 15–41)
Albumin: 4.3 g/dL (ref 3.5–5.0)
Alkaline Phosphatase: 62 U/L (ref 38–126)
Anion gap: 6 (ref 5–15)
BUN: 7 mg/dL (ref 6–20)
CO2: 30 mmol/L (ref 22–32)
Calcium: 9.1 mg/dL (ref 8.9–10.3)
Chloride: 106 mmol/L (ref 98–111)
Creatinine, Ser: 0.84 mg/dL (ref 0.44–1.00)
GFR, Estimated: >60 mL/min (ref >60)
Glucose, Bld: 114 mg/dL — ABNORMAL HIGH (ref 70–99)
Potassium: 4 mmol/L (ref 3.5–5.1)
Sodium: 142 mmol/L (ref 135–145)
Total Bilirubin: 0.8 mg/dL (ref 0.3–1.2)
Total Protein: 7.9 g/dL (ref 6.5–8.1)

## 2021-04-21 LAB — URINALYSIS, ROUTINE W REFLEX MICROSCOPIC
Bilirubin Urine: NEGATIVE
Glucose, UA: NEGATIVE mg/dL
Hgb urine dipstick: NEGATIVE
Ketones, ur: NEGATIVE mg/dL
Leukocytes,Ua: NEGATIVE
Nitrite: NEGATIVE
Protein, ur: 30 mg/dL — AB
Specific Gravity, Urine: 1.019 (ref 1.005–1.030)
pH: 5 (ref 5.0–8.0)

## 2021-04-21 LAB — CBC WITH DIFFERENTIAL/PLATELET
Abs Immature Granulocytes: 0.03 10*3/uL (ref 0.00–0.07)
Basophils Absolute: 0.1 10*3/uL (ref 0.0–0.1)
Basophils Relative: 1 %
Eosinophils Absolute: 0.2 10*3/uL (ref 0.0–0.5)
Eosinophils Relative: 2 %
HCT: 43.3 % (ref 36.0–46.0)
Hemoglobin: 13.8 g/dL (ref 12.0–15.0)
Immature Granulocytes: 0 %
Lymphocytes Relative: 52 %
Lymphs Abs: 5.1 10*3/uL — ABNORMAL HIGH (ref 0.7–4.0)
MCH: 27.2 pg (ref 26.0–34.0)
MCHC: 31.9 g/dL (ref 30.0–36.0)
MCV: 85.2 fL (ref 80.0–100.0)
Monocytes Absolute: 0.6 10*3/uL (ref 0.1–1.0)
Monocytes Relative: 6 %
Neutro Abs: 3.9 10*3/uL (ref 1.7–7.7)
Neutrophils Relative %: 39 %
Platelets: 364 10*3/uL (ref 150–400)
RBC: 5.08 MIL/uL (ref 3.87–5.11)
RDW: 13.8 % (ref 11.5–15.5)
WBC: 10 10*3/uL (ref 4.0–10.5)
nRBC: 0 % (ref 0.0–0.2)

## 2021-04-21 LAB — I-STAT BETA HCG BLOOD, ED (MC, WL, AP ONLY): I-stat hCG, quantitative: <5 m[IU]/mL (ref <5)

## 2021-04-21 MED ORDER — DIPHENHYDRAMINE HCL 50 MG/ML IJ SOLN
50.0000 mg | Freq: Once | INTRAMUSCULAR | Status: AC
  Administered 2021-04-21: 50 mg via INTRAVENOUS
  Filled 2021-04-21: qty 1

## 2021-04-21 MED ORDER — DIPHENHYDRAMINE HCL 50 MG/ML IJ SOLN
50.0000 mg | Freq: Once | INTRAMUSCULAR | Status: AC
  Administered 2021-04-22: 50 mg via INTRAVENOUS
  Filled 2021-04-21: qty 1

## 2021-04-21 MED ORDER — HYDROCORTISONE SOD SUC (PF) 250 MG IJ SOLR
200.0000 mg | Freq: Once | INTRAMUSCULAR | Status: AC
  Administered 2021-04-21: 200 mg via INTRAVENOUS
  Filled 2021-04-21: qty 200

## 2021-04-21 MED ORDER — KETOROLAC TROMETHAMINE 30 MG/ML IJ SOLN
15.0000 mg | Freq: Once | INTRAMUSCULAR | Status: AC
  Administered 2021-04-21: 15 mg via INTRAMUSCULAR
  Filled 2021-04-21: qty 1

## 2021-04-21 NOTE — ED Provider Notes (Signed)
[facility]-EMERGENCY DEPT Provider Note   CSN: [id number] Arrival date & time: 04/21/21  1825     History Chief Complaint  Patient presents with   Facial Swelling   Fall    Brianna Roberts is a 55 y.o. female.  HPI Patient presents with concern of pain, swelling, difficulty with swallowing.  Pain is focally in the right inferior anterior periauricular region.  Pain is sore, not improved with OTC medication or narcotics.  Pain is nonradiating, interfering with swallowing.  Notably patient was diagnosed with sinusitis about 10 days ago, has been taking her antibiotics as directed.  In addition to ongoing pain in that area patient had a accidental fall and struck her that side of her neck with her hand while falling.  This occurred yesterday.  Pain has been increased since that time.  Patient has multiple medical issues including HIV, HTLV, fibromyalgia.    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    Concussion    DDD (degenerative disc disease), cervical    Depression    Fibromyalgia    HIV (human immunodeficiency virus infection) (HCC)    Homelessness    Hypertension    Hypothyroidism    Insomnia    Neuromuscular disorder (HCC)    Thyroid disease    TIA (transient ischemic attack)     Patient Active Problem List   Diagnosis Date Noted   Dysarthria 12/13/2017   Headache 12/13/2017   Chronic pain syndrome 03/17/2017   Incomplete emptying of bladder 02/22/2017   Prediabetes 12/31/2016   Post concussion syndrome 10/06/2016   HTLV I (human T-cell lymphotropic virus 1 infection) 10/05/2016   Fibromyalgia 10/05/2016   Benign essential HTN 10/05/2016   Hypothyroidism 10/05/2016   Allergic rhinitis 10/05/2016   Chronic fatigue syndrome 10/05/2016   Asthma, chronic 10/05/2016   HSV (herpes simplex virus) anogenital infection 10/05/2016    Anxiety and depression 10/05/2016   Bilateral leg edema 10/05/2016   Ovarian cyst 10/05/2016   Nephrolithiasis 10/05/2016   IBS (irritable bowel syndrome) 10/05/2016   OSA (obstructive sleep apnea) 10/05/2016   Left-sided weakness 10/05/2016   Migraine 10/05/2016   BMI 45.0-49.9, adult (HCC) 10/05/2016   DDD (degenerative disc disease), cervical    Insomnia     Past Surgical History:  Procedure Laterality Date   ABDOMINAL HYSTERECTOMY     Partial-ovaries and tubes remain   BREAST BIOPSY Left    CHOLECYSTECTOMY  2006   COLONOSCOPY  07/27/2015   [place]   HERNIA REPAIR  1997   TUMOR EXCISION Right 1984   Sinuses   TUMOR EXCISION Left 2015   sinuses   URACHAL CYST EXCISION  1997     OB History   No obstetric history on file.     Family History  Problem Relation Age of Onset   Diabetes Mother    Heart disease Mother    Hyperlipidemia Mother    Hypertension Mother    Stroke Mother    Mental illness Mother    Heart failure Mother    Headache Mother        Joellyn Quails Cyst   Breast cancer Mother 62       diagnosed 3 times   Heart disease Father    Alcohol abuse Father    Hypertension Sister    Heart disease Brother  Mental illness Brother    COPD Brother    Heart failure Brother    Hypertension Sister    Colon cancer Neg Hx     Social History   Tobacco Use   Smoking status: Every Day    Packs/day: 0.30    Years: 33.00    Pack years: 9.90    Types: Cigarettes   Smokeless tobacco: Never  Vaping Use   Vaping Use: Never used  Substance Use Topics   Alcohol use: No   Drug use: No    Home Medications Prior to Admission medications   Medication Sig Start Date End Date Taking? Authorizing Provider  acetaminophen (TYLENOL) 325 MG tablet Take 650 mg by mouth every 6 (six) hours as needed (for pain).    [provider]  acyclovir (ZOVIRAX) 200 MG capsule Take 1 capsule (200 mg total) by mouth 2 (two) times daily. For Outbreak: 2 capsules  (400 mg) TID x 5 days. Patient taking differently: Take 400 mg by mouth 2 (two) times daily.  11/01/17   Porfirio Oar, PA  albuterol (PROVENTIL HFA;VENTOLIN HFA) 108 (90 Base) MCG/ACT inhaler Inhale 1-2 puffs into the lungs every 6 (six) hours as needed for wheezing or shortness of breath. 08/09/17   Porfirio Oar, PA  amitriptyline (ELAVIL) 75 MG tablet Take 1 tablet (75 mg total) by mouth at bedtime. 08/09/17   Porfirio Oar, PA  amLODipine (NORVASC) 10 MG tablet Take 1 tablet (10 mg total) by mouth daily. 08/21/17   Porfirio Oar, PA  ARIPiprazole (ABILIFY) 10 MG tablet Take 1 tablet (10 mg total) by mouth daily. 10/05/16   Porfirio Oar, PA  ASPERCREME LIDOCAINE EX Apply 1 application topically 2 (two) times daily as needed (for muscle pain).    [provider]  azelastine (ASTELIN) 0.1 % nasal spray USE 2 SPRAYS EACH NOSTRIL TWICE DAILY. 10/15/17   Porfirio Oar, PA  baclofen (LIORESAL) 10 MG tablet TAKE 1 2 TO 1 (ONE HALF TO ONE) TABLET BY MOUTH AS NEEDED TO TWICE DAILY FOR HEADACHE. LIMIT TO 2 HEADACHE DAYS PER WEEK. AVOID DAILY USE. 01/27/18   [provider]  budesonide-formoterol (SYMBICORT) 160-4.5 MCG/ACT inhaler Inhale 2 puffs into the lungs 2 (two) times daily. 12/27/16   Porfirio Oar, PA  clobetasol ointment (TEMOVATE) 0.05 % APPLY  OINTMENT TOPICALLY TWICE DAILY. USE  IN  PLACE  OF  TRIAMCINOLONE 09/12/18   [provider]  diazepam (VALIUM) 5 MG tablet Take 1 tablet (5 mg total) by mouth every 8 (eight) hours as needed for anxiety. 10/14/17   Porfirio Oar, PA  diphenhydrAMINE (BENADRYL) 25 mg capsule Take 25 mg by mouth 2 (two) times daily.    [provider]  fluticasone (FLONASE) 50 MCG/ACT nasal spray USE 1 SPRAY IN EACH NOSTRIL DAILY. 04/02/17   Porfirio Oar, PA  furosemide (LASIX) 40 MG tablet Take 1 tablet (40 mg total) by mouth 2 (two) times daily. Patient taking differently: Take 80 mg by mouth at bedtime.  08/09/17   Porfirio Oar, PA  glucose blood test strip Use as instructed 04/17/17   Porfirio Oar, PA  hydrOXYzine (ATARAX/VISTARIL) 10 MG tablet Take 1 tablet (10 mg total) by mouth at bedtime. Patient taking differently: Take 10 mg by mouth at bedtime as needed (itching or angioedema).  10/05/16   Porfirio Oar, PA  Incontinence Supply Disposable (PREVAIL WASHCLOTHS) MISC Use daily as directed 06/03/17   Porfirio Oar, PA  ipratropium-albuterol (DUONEB) 0.5-2.5 (3) MG/3ML SOLN Take 3 mLs by  nebulization every 6 (six) hours as needed (for breathing). Patient taking differently: Take 3 mLs by nebulization every 6 (six) hours as needed (for shortness of breath or wheezing).  10/05/16   Porfirio Oar, PA  ketorolac (ACULAR) 0.4 % SOLN Place one drop into both eyes 4 (four) times daily. 12/02/19   [provider]  Lancets Thin MISC Use to check home glucose daily 03/28/17   Porfirio Oar, PA  levothyroxine (SYNTHROID, LEVOTHROID) 50 MCG tablet Take 1 tablet (50 mcg total) by mouth daily before breakfast. Patient taking differently: Take 50 mcg by mouth daily.  08/17/17   Porfirio Oar, PA  losartan (COZAAR) 100 MG tablet Take 1 tablet (100 mg total) by mouth daily. 08/09/17   Porfirio Oar, PA  meloxicam (MOBIC) 15 MG tablet TAKE 1 TABLET EACH DAY. 11/08/17   Porfirio Oar, PA  metFORMIN (GLUCOPHAGE-XR) 500 MG 24 hr tablet Take by mouth. 02/24/20   [provider]  methocarbamol (ROBAXIN) 500 MG tablet Take 2 tablets (1,000 mg total) by mouth every 8 (eight) hours as needed for muscle spasms. 02/01/17   Loren Racer, MD  metoprolol tartrate (LOPRESSOR) 100 MG tablet Take 1 tablet (100 mg total) by mouth 2 (two) times daily. 08/09/17   Porfirio Oar, PA  naloxone [facility]) nasal spray 4 mg/0.1 mL Place into the nose. 04/14/18   [provider]  Nerve Stimulator (PRO COMFORT TENS UNIT) DEVI 1 Device by Does not apply route daily as needed. 12/27/16   Porfirio Oar, PA  nystatin cream  (MYCOSTATIN) Apply 1 application topically 2 (two) times daily.    [provider]  ondansetron (ZOFRAN ODT) 4 MG disintegrating tablet Take 1 tablet (4 mg total) by mouth every 8 (eight) hours as needed for nausea or vomiting. 11/04/19   Gailen Shelter, PA  oxyCODONE (OXY IR/ROXICODONE) 5 MG immediate release tablet Take 1 tablet (5 mg total) by mouth every 4 (four) hours as needed for severe pain. 10/15/17   Porfirio Oar, PA  potassium chloride SA (K-DUR,KLOR-CON) 20 MEQ tablet Take 1 tablet (20 mEq total) by mouth 2 (two) times daily. 06/24/17   Porfirio Oar, PA  sertraline (ZOLOFT) 100 MG tablet Take 100 mg by mouth daily.    [provider]  traMADol (ULTRAM) 50 MG tablet Take 1 tablet (50 mg total) by mouth 3 (three) times daily as needed. Patient taking differently: Take 50 mg by mouth at bedtime.  10/14/17   Porfirio Oar, PA  triamcinolone ointment (KENALOG) 0.5 % Apply 1 application topically 2 (two) times daily.    [provider]  vitamin B-12 (CYANOCOBALAMIN) 100 MCG tablet Take 100 mcg by mouth daily.    [provider]  zonisamide (ZONEGRAN) 25 MG capsule TAKE 1 CAPSULE BY MOUTH AT BEDTIME FOR 7 DAYS THEN INCREASE BY 1 CAPSULE BY MOUTH WEEKLY UNTIL TAKING 4 CAPSULES BY MOUTH AT BEDTIME. 01/27/18   [provider]    Allergies    Hydromorphone, Iodinated diagnostic agents, Sulfa antibiotics, Codeine, Hydromorphone hcl, Nickel, Penicillins, Spironolactone, and Adhesive [tape]  Review of Systems   Review of Systems  Constitutional:        Per HPI, otherwise negative  HENT:         Per HPI, otherwise negative  Respiratory:         Per HPI, otherwise negative  Cardiovascular:        Per HPI, otherwise negative  Gastrointestinal:  Negative for vomiting.  Endocrine:  Negative aside from HPI  Genitourinary:        Neg aside from HPI   Musculoskeletal:        Per HPI, otherwise negative  Skin: Negative.   Neurological:   Negative for syncope.   Physical Exam Updated Vital Signs BP (!) 158/90 (BP Location: Right Arm)   Pulse 85   Temp 97.8 F (36.6 C) (Oral)   Resp 16   SpO2 99%   Physical Exam Vitals and nursing note reviewed.  Constitutional:      General: She is not in acute distress.    Appearance: She is well-developed.  HENT:     Head: Normocephalic and atraumatic.      Right Ear: Tympanic membrane and ear canal normal.     Mouth/Throat:   Eyes:     Conjunctiva/sclera: Conjunctivae normal.  Cardiovascular:     Rate and Rhythm: Normal rate and regular rhythm.  Pulmonary:     Effort: Pulmonary effort is normal. No respiratory distress.     Breath sounds: Normal breath sounds. No stridor.  Abdominal:     General: There is no distension.  Skin:    General: Skin is warm and dry.  Neurological:     Mental Status: She is alert and oriented to person, place, and time.     Cranial Nerves: No cranial nerve deficit.    ED Results / Procedures / Treatments   Labs (all labs ordered are listed, but only abnormal results are displayed) Labs Reviewed  COMPREHENSIVE METABOLIC PANEL - Abnormal; Notable for the following components:      Result Value   Glucose, Bld 114 (*)    All other components within normal limits  CBC WITH DIFFERENTIAL/PLATELET - Abnormal; Notable for the following components:   Lymphs Abs 5.1 (*)    All other components within normal limits  URINALYSIS, ROUTINE W REFLEX MICROSCOPIC  I-STAT BETA HCG BLOOD, ED ([facility], [facility], [facility] ONLY)    EKG None  Radiology No results found.  Procedures Procedures   Medications Ordered in ED Medications  hydrocortisone sodium succinate (SOLU-CORTEF) injection 200 mg (200 mg Intravenous Given 04/21/21 2132)  diphenhydrAMINE (BENADRYL) injection 50 mg (50 mg Intravenous Given 04/21/21 2130)  ketorolac (TORADOL) 30 MG/ML injection 15 mg (15 mg Intramuscular Given 04/21/21 2131)    ED Course  I have reviewed the triage vital signs and  the nursing notes.  Pertinent labs & imaging results that were available during my care of the patient were reviewed by me and considered in my medical decision making (see chart for details).  After initial evaluation with consideration of abscess versus adenopathy versus inflammation we discussed indication for labs, CT imaging.  Patient notes that her listed allergy to contrast dye is possibly erroneous, but she will require prep as she describes prior dyspnea following last contrast dye study about 4 years ago.  10:45 PM Patient has been preparing for CT scan.  Labs reviewed, reassuring. MDM Rules/Calculators/A&P Adult female presents with right facial lesion concern for deep space infection versus inflammatory changes and adenopathy.  Patient's airway is patent, but given concern as above for infection, she was awaiting CT scan after appropriate preparation given her history of possible allergic reaction to contrast dye.  Patient's initial labs reassuring, vital signs reassuring.  She is currently on doxycycline, may require adjustment of medication once results are available. Final Clinical Impression(s) / ED Diagnoses Final diagnoses:  Facial swelling  MDM Number of Diagnoses or Management Options Facial swelling: established,  worsening   Amount and/or Complexity of Data Reviewed Clinical lab tests: ordered and reviewed Tests in the radiology section of CPT: ordered Tests in the medicine section of CPT: reviewed and ordered Decide to obtain previous medical records or to obtain history from someone other than the patient: yes Review and summarize past medical records: yes Discuss the patient with other providers: yes Independent visualization of images, tracings, or specimens: yes  Risk of Complications, Morbidity, and/or Mortality Presenting problems: high Diagnostic procedures: high Management options: high  Critical Care Total time providing critical care: < 30  minutes  Patient Progress Patient progress: stable     Gerhard Munch, MD 04/21/21 2246

## 2021-04-21 NOTE — ED Triage Notes (Signed)
Per EMS- patient was on the teleconference with dr.with c/o  swelling on both sides of her neck .   Ems reports that the patient has salivary gland infection and states she has been taking antibiotics for the same and this has been day 9/10 with facial swelling.  Patient also reported to EMS that she slid out of her chair and hit her face with the palm of her hand and caused the right side to swell more than left.

## 2021-04-22 ENCOUNTER — Emergency Department (HOSPITAL_COMMUNITY): Payer: Medicare HMO

## 2021-04-22 DIAGNOSIS — R221 Localized swelling, mass and lump, neck: Secondary | ICD-10-CM | POA: Diagnosis not present

## 2021-04-22 DIAGNOSIS — K118 Other diseases of salivary glands: Secondary | ICD-10-CM | POA: Diagnosis not present

## 2021-04-22 DIAGNOSIS — J320 Chronic maxillary sinusitis: Secondary | ICD-10-CM | POA: Diagnosis not present

## 2021-04-22 DIAGNOSIS — M47812 Spondylosis without myelopathy or radiculopathy, cervical region: Secondary | ICD-10-CM | POA: Diagnosis not present

## 2021-04-22 MED ORDER — IOHEXOL 350 MG/ML SOLN
100.0000 mL | Freq: Once | INTRAVENOUS | Status: AC | PRN
  Administered 2021-04-22: 100 mL via INTRAVENOUS

## 2021-04-22 MED ORDER — DEXAMETHASONE 2 MG PO TABS: 10.0000 mg | ORAL_TABLET | Freq: Once | ORAL | 0 refills | Status: DC | PRN

## 2021-04-22 MED ORDER — DOXYCYCLINE HYCLATE 100 MG PO CAPS: 100.0000 mg | ORAL_CAPSULE | Freq: Two times a day (BID) | ORAL | 0 refills | Status: DC

## 2021-04-22 NOTE — Discharge Instructions (Addendum)
The cause of your swelling is unclear today.  Please follow up with your family doctor for further evaluation. If your swelling returns you can take a dose of the steroid that was prescribed. If you have redness that returns or worsens after you complete your antibiotics you may start the second course of antibiotics.

## 2021-04-23 LAB — URINE CULTURE: Culture: NO GROWTH

## 2021-04-24 DIAGNOSIS — R69 Illness, unspecified: Secondary | ICD-10-CM | POA: Diagnosis not present

## 2021-04-24 DIAGNOSIS — F341 Dysthymic disorder: Secondary | ICD-10-CM | POA: Diagnosis not present

## 2021-05-08 DIAGNOSIS — F341 Dysthymic disorder: Secondary | ICD-10-CM | POA: Diagnosis not present

## 2021-05-08 DIAGNOSIS — R69 Illness, unspecified: Secondary | ICD-10-CM | POA: Diagnosis not present

## 2021-05-09 DIAGNOSIS — F341 Dysthymic disorder: Secondary | ICD-10-CM | POA: Diagnosis not present

## 2021-05-09 DIAGNOSIS — R69 Illness, unspecified: Secondary | ICD-10-CM | POA: Diagnosis not present

## 2021-05-12 DIAGNOSIS — M25551 Pain in right hip: Secondary | ICD-10-CM | POA: Diagnosis not present

## 2021-05-12 DIAGNOSIS — R0781 Pleurodynia: Secondary | ICD-10-CM | POA: Diagnosis not present

## 2021-05-12 DIAGNOSIS — R220 Localized swelling, mass and lump, head: Secondary | ICD-10-CM | POA: Diagnosis not present

## 2021-05-12 DIAGNOSIS — G8929 Other chronic pain: Secondary | ICD-10-CM | POA: Diagnosis not present

## 2021-05-12 DIAGNOSIS — N644 Mastodynia: Secondary | ICD-10-CM | POA: Diagnosis not present

## 2021-05-12 DIAGNOSIS — I10 Essential (primary) hypertension: Secondary | ICD-10-CM | POA: Diagnosis not present

## 2021-05-22 DIAGNOSIS — F341 Dysthymic disorder: Secondary | ICD-10-CM | POA: Diagnosis not present

## 2021-05-22 DIAGNOSIS — R69 Illness, unspecified: Secondary | ICD-10-CM | POA: Diagnosis not present

## 2021-05-25 DIAGNOSIS — M546 Pain in thoracic spine: Secondary | ICD-10-CM | POA: Diagnosis not present

## 2021-05-25 DIAGNOSIS — Z79891 Long term (current) use of opiate analgesic: Secondary | ICD-10-CM | POA: Diagnosis not present

## 2021-05-25 DIAGNOSIS — M549 Dorsalgia, unspecified: Secondary | ICD-10-CM | POA: Diagnosis not present

## 2021-05-25 DIAGNOSIS — I10 Essential (primary) hypertension: Secondary | ICD-10-CM | POA: Diagnosis not present

## 2021-05-25 DIAGNOSIS — G8929 Other chronic pain: Secondary | ICD-10-CM | POA: Diagnosis not present

## 2021-05-25 DIAGNOSIS — M797 Fibromyalgia: Secondary | ICD-10-CM | POA: Diagnosis not present

## 2021-05-25 DIAGNOSIS — G894 Chronic pain syndrome: Secondary | ICD-10-CM | POA: Diagnosis not present

## 2021-05-29 DIAGNOSIS — R69 Illness, unspecified: Secondary | ICD-10-CM | POA: Diagnosis not present

## 2021-05-29 DIAGNOSIS — F341 Dysthymic disorder: Secondary | ICD-10-CM | POA: Diagnosis not present

## 2021-05-30 DIAGNOSIS — E1169 Type 2 diabetes mellitus with other specified complication: Secondary | ICD-10-CM | POA: Diagnosis not present

## 2021-05-30 DIAGNOSIS — N9489 Other specified conditions associated with female genital organs and menstrual cycle: Secondary | ICD-10-CM | POA: Diagnosis not present

## 2021-05-30 DIAGNOSIS — Z6841 Body Mass Index (BMI) 40.0 and over, adult: Secondary | ICD-10-CM | POA: Diagnosis not present

## 2021-05-30 DIAGNOSIS — E119 Type 2 diabetes mellitus without complications: Secondary | ICD-10-CM | POA: Diagnosis not present

## 2021-05-30 DIAGNOSIS — E669 Obesity, unspecified: Secondary | ICD-10-CM | POA: Diagnosis not present

## 2021-05-30 DIAGNOSIS — K529 Noninfective gastroenteritis and colitis, unspecified: Secondary | ICD-10-CM | POA: Diagnosis not present

## 2021-05-30 DIAGNOSIS — R300 Dysuria: Secondary | ICD-10-CM | POA: Diagnosis not present

## 2021-06-08 DIAGNOSIS — M546 Pain in thoracic spine: Secondary | ICD-10-CM | POA: Diagnosis not present

## 2021-06-08 DIAGNOSIS — M50321 Other cervical disc degeneration at C4-C5 level: Secondary | ICD-10-CM | POA: Diagnosis not present

## 2021-06-08 DIAGNOSIS — G8929 Other chronic pain: Secondary | ICD-10-CM | POA: Diagnosis not present

## 2021-06-08 DIAGNOSIS — M25551 Pain in right hip: Secondary | ICD-10-CM | POA: Diagnosis not present

## 2021-06-08 DIAGNOSIS — M797 Fibromyalgia: Secondary | ICD-10-CM | POA: Diagnosis not present

## 2021-06-08 DIAGNOSIS — R0781 Pleurodynia: Secondary | ICD-10-CM | POA: Diagnosis not present

## 2021-06-08 DIAGNOSIS — Z791 Long term (current) use of non-steroidal anti-inflammatories (NSAID): Secondary | ICD-10-CM | POA: Diagnosis not present

## 2021-06-08 DIAGNOSIS — G894 Chronic pain syndrome: Secondary | ICD-10-CM | POA: Diagnosis not present

## 2021-06-08 DIAGNOSIS — M50322 Other cervical disc degeneration at C5-C6 level: Secondary | ICD-10-CM | POA: Diagnosis not present

## 2021-06-08 DIAGNOSIS — M545 Low back pain, unspecified: Secondary | ICD-10-CM | POA: Diagnosis not present

## 2021-06-08 DIAGNOSIS — Z9181 History of falling: Secondary | ICD-10-CM | POA: Diagnosis not present

## 2021-06-12 DIAGNOSIS — F341 Dysthymic disorder: Secondary | ICD-10-CM | POA: Diagnosis not present

## 2021-06-12 DIAGNOSIS — R69 Illness, unspecified: Secondary | ICD-10-CM | POA: Diagnosis not present

## 2021-06-21 DIAGNOSIS — M797 Fibromyalgia: Secondary | ICD-10-CM | POA: Diagnosis not present

## 2021-06-21 DIAGNOSIS — G8929 Other chronic pain: Secondary | ICD-10-CM | POA: Diagnosis not present

## 2021-06-21 DIAGNOSIS — M50322 Other cervical disc degeneration at C5-C6 level: Secondary | ICD-10-CM | POA: Diagnosis not present

## 2021-06-21 DIAGNOSIS — G894 Chronic pain syndrome: Secondary | ICD-10-CM | POA: Diagnosis not present

## 2021-06-21 DIAGNOSIS — Z791 Long term (current) use of non-steroidal anti-inflammatories (NSAID): Secondary | ICD-10-CM | POA: Diagnosis not present

## 2021-06-21 DIAGNOSIS — M545 Low back pain, unspecified: Secondary | ICD-10-CM | POA: Diagnosis not present

## 2021-06-21 DIAGNOSIS — M50321 Other cervical disc degeneration at C4-C5 level: Secondary | ICD-10-CM | POA: Diagnosis not present

## 2021-06-21 DIAGNOSIS — R0781 Pleurodynia: Secondary | ICD-10-CM | POA: Diagnosis not present

## 2021-06-21 DIAGNOSIS — M25551 Pain in right hip: Secondary | ICD-10-CM | POA: Diagnosis not present

## 2021-06-21 DIAGNOSIS — M546 Pain in thoracic spine: Secondary | ICD-10-CM | POA: Diagnosis not present

## 2021-06-22 DIAGNOSIS — M797 Fibromyalgia: Secondary | ICD-10-CM | POA: Diagnosis not present

## 2021-06-22 DIAGNOSIS — M50322 Other cervical disc degeneration at C5-C6 level: Secondary | ICD-10-CM | POA: Diagnosis not present

## 2021-06-22 DIAGNOSIS — M50321 Other cervical disc degeneration at C4-C5 level: Secondary | ICD-10-CM | POA: Diagnosis not present

## 2021-06-22 DIAGNOSIS — G8929 Other chronic pain: Secondary | ICD-10-CM | POA: Diagnosis not present

## 2021-06-22 DIAGNOSIS — R0781 Pleurodynia: Secondary | ICD-10-CM | POA: Diagnosis not present

## 2021-06-22 DIAGNOSIS — M545 Low back pain, unspecified: Secondary | ICD-10-CM | POA: Diagnosis not present

## 2021-06-22 DIAGNOSIS — M546 Pain in thoracic spine: Secondary | ICD-10-CM | POA: Diagnosis not present

## 2021-06-22 DIAGNOSIS — Z791 Long term (current) use of non-steroidal anti-inflammatories (NSAID): Secondary | ICD-10-CM | POA: Diagnosis not present

## 2021-06-22 DIAGNOSIS — M25551 Pain in right hip: Secondary | ICD-10-CM | POA: Diagnosis not present

## 2021-06-22 DIAGNOSIS — G894 Chronic pain syndrome: Secondary | ICD-10-CM | POA: Diagnosis not present

## 2021-06-26 DIAGNOSIS — F341 Dysthymic disorder: Secondary | ICD-10-CM | POA: Diagnosis not present

## 2021-06-26 DIAGNOSIS — R69 Illness, unspecified: Secondary | ICD-10-CM | POA: Diagnosis not present

## 2021-06-28 DIAGNOSIS — M50322 Other cervical disc degeneration at C5-C6 level: Secondary | ICD-10-CM | POA: Diagnosis not present

## 2021-06-28 DIAGNOSIS — Z791 Long term (current) use of non-steroidal anti-inflammatories (NSAID): Secondary | ICD-10-CM | POA: Diagnosis not present

## 2021-06-28 DIAGNOSIS — R0781 Pleurodynia: Secondary | ICD-10-CM | POA: Diagnosis not present

## 2021-06-28 DIAGNOSIS — M797 Fibromyalgia: Secondary | ICD-10-CM | POA: Diagnosis not present

## 2021-06-28 DIAGNOSIS — G8929 Other chronic pain: Secondary | ICD-10-CM | POA: Diagnosis not present

## 2021-06-28 DIAGNOSIS — M50321 Other cervical disc degeneration at C4-C5 level: Secondary | ICD-10-CM | POA: Diagnosis not present

## 2021-06-28 DIAGNOSIS — M25551 Pain in right hip: Secondary | ICD-10-CM | POA: Diagnosis not present

## 2021-06-28 DIAGNOSIS — G894 Chronic pain syndrome: Secondary | ICD-10-CM | POA: Diagnosis not present

## 2021-06-28 DIAGNOSIS — M546 Pain in thoracic spine: Secondary | ICD-10-CM | POA: Diagnosis not present

## 2021-06-28 DIAGNOSIS — M545 Low back pain, unspecified: Secondary | ICD-10-CM | POA: Diagnosis not present

## 2021-06-30 DIAGNOSIS — R922 Inconclusive mammogram: Secondary | ICD-10-CM | POA: Diagnosis not present

## 2021-06-30 DIAGNOSIS — N6081 Other benign mammary dysplasias of right breast: Secondary | ICD-10-CM | POA: Diagnosis not present

## 2021-06-30 DIAGNOSIS — R2231 Localized swelling, mass and lump, right upper limb: Secondary | ICD-10-CM | POA: Diagnosis not present

## 2021-07-03 DIAGNOSIS — F341 Dysthymic disorder: Secondary | ICD-10-CM | POA: Diagnosis not present

## 2021-07-03 DIAGNOSIS — R69 Illness, unspecified: Secondary | ICD-10-CM | POA: Diagnosis not present

## 2021-07-05 DIAGNOSIS — M546 Pain in thoracic spine: Secondary | ICD-10-CM | POA: Diagnosis not present

## 2021-07-05 DIAGNOSIS — M25551 Pain in right hip: Secondary | ICD-10-CM | POA: Diagnosis not present

## 2021-07-05 DIAGNOSIS — G894 Chronic pain syndrome: Secondary | ICD-10-CM | POA: Diagnosis not present

## 2021-07-05 DIAGNOSIS — Z791 Long term (current) use of non-steroidal anti-inflammatories (NSAID): Secondary | ICD-10-CM | POA: Diagnosis not present

## 2021-07-05 DIAGNOSIS — M50321 Other cervical disc degeneration at C4-C5 level: Secondary | ICD-10-CM | POA: Diagnosis not present

## 2021-07-05 DIAGNOSIS — M797 Fibromyalgia: Secondary | ICD-10-CM | POA: Diagnosis not present

## 2021-07-05 DIAGNOSIS — R0781 Pleurodynia: Secondary | ICD-10-CM | POA: Diagnosis not present

## 2021-07-05 DIAGNOSIS — M50322 Other cervical disc degeneration at C5-C6 level: Secondary | ICD-10-CM | POA: Diagnosis not present

## 2021-07-05 DIAGNOSIS — G8929 Other chronic pain: Secondary | ICD-10-CM | POA: Diagnosis not present

## 2021-07-05 DIAGNOSIS — M545 Low back pain, unspecified: Secondary | ICD-10-CM | POA: Diagnosis not present

## 2021-07-08 DIAGNOSIS — M797 Fibromyalgia: Secondary | ICD-10-CM | POA: Diagnosis not present

## 2021-07-08 DIAGNOSIS — M50321 Other cervical disc degeneration at C4-C5 level: Secondary | ICD-10-CM | POA: Diagnosis not present

## 2021-07-08 DIAGNOSIS — R0781 Pleurodynia: Secondary | ICD-10-CM | POA: Diagnosis not present

## 2021-07-08 DIAGNOSIS — Z791 Long term (current) use of non-steroidal anti-inflammatories (NSAID): Secondary | ICD-10-CM | POA: Diagnosis not present

## 2021-07-08 DIAGNOSIS — M545 Low back pain, unspecified: Secondary | ICD-10-CM | POA: Diagnosis not present

## 2021-07-08 DIAGNOSIS — G894 Chronic pain syndrome: Secondary | ICD-10-CM | POA: Diagnosis not present

## 2021-07-08 DIAGNOSIS — G8929 Other chronic pain: Secondary | ICD-10-CM | POA: Diagnosis not present

## 2021-07-08 DIAGNOSIS — M50322 Other cervical disc degeneration at C5-C6 level: Secondary | ICD-10-CM | POA: Diagnosis not present

## 2021-07-08 DIAGNOSIS — M546 Pain in thoracic spine: Secondary | ICD-10-CM | POA: Diagnosis not present

## 2021-07-08 DIAGNOSIS — M25551 Pain in right hip: Secondary | ICD-10-CM | POA: Diagnosis not present

## 2021-07-10 DIAGNOSIS — M25551 Pain in right hip: Secondary | ICD-10-CM | POA: Diagnosis not present

## 2021-07-10 DIAGNOSIS — M545 Low back pain, unspecified: Secondary | ICD-10-CM | POA: Diagnosis not present

## 2021-07-10 DIAGNOSIS — R69 Illness, unspecified: Secondary | ICD-10-CM | POA: Diagnosis not present

## 2021-07-10 DIAGNOSIS — M546 Pain in thoracic spine: Secondary | ICD-10-CM | POA: Diagnosis not present

## 2021-07-10 DIAGNOSIS — G894 Chronic pain syndrome: Secondary | ICD-10-CM | POA: Diagnosis not present

## 2021-07-10 DIAGNOSIS — F341 Dysthymic disorder: Secondary | ICD-10-CM | POA: Diagnosis not present

## 2021-07-10 DIAGNOSIS — M50322 Other cervical disc degeneration at C5-C6 level: Secondary | ICD-10-CM | POA: Diagnosis not present

## 2021-07-10 DIAGNOSIS — Z791 Long term (current) use of non-steroidal anti-inflammatories (NSAID): Secondary | ICD-10-CM | POA: Diagnosis not present

## 2021-07-10 DIAGNOSIS — G8929 Other chronic pain: Secondary | ICD-10-CM | POA: Diagnosis not present

## 2021-07-10 DIAGNOSIS — M797 Fibromyalgia: Secondary | ICD-10-CM | POA: Diagnosis not present

## 2021-07-10 DIAGNOSIS — R0781 Pleurodynia: Secondary | ICD-10-CM | POA: Diagnosis not present

## 2021-07-10 DIAGNOSIS — M50321 Other cervical disc degeneration at C4-C5 level: Secondary | ICD-10-CM | POA: Diagnosis not present

## 2021-07-18 DIAGNOSIS — Z794 Long term (current) use of insulin: Secondary | ICD-10-CM | POA: Diagnosis not present

## 2021-07-18 DIAGNOSIS — K589 Irritable bowel syndrome without diarrhea: Secondary | ICD-10-CM | POA: Diagnosis not present

## 2021-07-18 DIAGNOSIS — E119 Type 2 diabetes mellitus without complications: Secondary | ICD-10-CM | POA: Diagnosis not present

## 2021-07-21 DIAGNOSIS — F3341 Major depressive disorder, recurrent, in partial remission: Secondary | ICD-10-CM | POA: Diagnosis not present

## 2021-07-21 DIAGNOSIS — I10 Essential (primary) hypertension: Secondary | ICD-10-CM | POA: Diagnosis not present

## 2021-07-21 DIAGNOSIS — R69 Illness, unspecified: Secondary | ICD-10-CM | POA: Diagnosis not present

## 2021-07-21 DIAGNOSIS — Z1321 Encounter for screening for nutritional disorder: Secondary | ICD-10-CM | POA: Diagnosis not present

## 2021-07-21 DIAGNOSIS — E669 Obesity, unspecified: Secondary | ICD-10-CM | POA: Diagnosis not present

## 2021-07-21 DIAGNOSIS — E039 Hypothyroidism, unspecified: Secondary | ICD-10-CM | POA: Diagnosis not present

## 2021-07-21 DIAGNOSIS — R5383 Other fatigue: Secondary | ICD-10-CM | POA: Diagnosis not present

## 2021-07-21 DIAGNOSIS — Z6841 Body Mass Index (BMI) 40.0 and over, adult: Secondary | ICD-10-CM | POA: Diagnosis not present

## 2021-07-21 DIAGNOSIS — E1169 Type 2 diabetes mellitus with other specified complication: Secondary | ICD-10-CM | POA: Diagnosis not present

## 2021-07-21 DIAGNOSIS — E538 Deficiency of other specified B group vitamins: Secondary | ICD-10-CM | POA: Diagnosis not present

## 2021-07-24 DIAGNOSIS — I10 Essential (primary) hypertension: Secondary | ICD-10-CM | POA: Diagnosis not present

## 2021-07-24 DIAGNOSIS — R339 Retention of urine, unspecified: Secondary | ICD-10-CM | POA: Diagnosis not present

## 2021-07-24 DIAGNOSIS — N200 Calculus of kidney: Secondary | ICD-10-CM | POA: Diagnosis not present

## 2021-07-24 DIAGNOSIS — R300 Dysuria: Secondary | ICD-10-CM | POA: Diagnosis not present

## 2021-07-25 DIAGNOSIS — M546 Pain in thoracic spine: Secondary | ICD-10-CM | POA: Diagnosis not present

## 2021-07-25 DIAGNOSIS — G8929 Other chronic pain: Secondary | ICD-10-CM | POA: Diagnosis not present

## 2021-07-25 DIAGNOSIS — M50322 Other cervical disc degeneration at C5-C6 level: Secondary | ICD-10-CM | POA: Diagnosis not present

## 2021-07-25 DIAGNOSIS — M50321 Other cervical disc degeneration at C4-C5 level: Secondary | ICD-10-CM | POA: Diagnosis not present

## 2021-07-25 DIAGNOSIS — R0781 Pleurodynia: Secondary | ICD-10-CM | POA: Diagnosis not present

## 2021-07-25 DIAGNOSIS — M25551 Pain in right hip: Secondary | ICD-10-CM | POA: Diagnosis not present

## 2021-07-25 DIAGNOSIS — M797 Fibromyalgia: Secondary | ICD-10-CM | POA: Diagnosis not present

## 2021-07-25 DIAGNOSIS — F341 Dysthymic disorder: Secondary | ICD-10-CM | POA: Diagnosis not present

## 2021-07-25 DIAGNOSIS — M545 Low back pain, unspecified: Secondary | ICD-10-CM | POA: Diagnosis not present

## 2021-07-25 DIAGNOSIS — G894 Chronic pain syndrome: Secondary | ICD-10-CM | POA: Diagnosis not present

## 2021-07-25 DIAGNOSIS — R69 Illness, unspecified: Secondary | ICD-10-CM | POA: Diagnosis not present

## 2021-07-25 DIAGNOSIS — Z791 Long term (current) use of non-steroidal anti-inflammatories (NSAID): Secondary | ICD-10-CM | POA: Diagnosis not present

## 2021-07-31 DIAGNOSIS — F341 Dysthymic disorder: Secondary | ICD-10-CM | POA: Diagnosis not present

## 2021-07-31 DIAGNOSIS — R69 Illness, unspecified: Secondary | ICD-10-CM | POA: Diagnosis not present

## 2021-08-01 DIAGNOSIS — M546 Pain in thoracic spine: Secondary | ICD-10-CM | POA: Diagnosis not present

## 2021-08-01 DIAGNOSIS — G8929 Other chronic pain: Secondary | ICD-10-CM | POA: Diagnosis not present

## 2021-08-01 DIAGNOSIS — M50321 Other cervical disc degeneration at C4-C5 level: Secondary | ICD-10-CM | POA: Diagnosis not present

## 2021-08-01 DIAGNOSIS — Z791 Long term (current) use of non-steroidal anti-inflammatories (NSAID): Secondary | ICD-10-CM | POA: Diagnosis not present

## 2021-08-01 DIAGNOSIS — M797 Fibromyalgia: Secondary | ICD-10-CM | POA: Diagnosis not present

## 2021-08-01 DIAGNOSIS — M50322 Other cervical disc degeneration at C5-C6 level: Secondary | ICD-10-CM | POA: Diagnosis not present

## 2021-08-01 DIAGNOSIS — M25551 Pain in right hip: Secondary | ICD-10-CM | POA: Diagnosis not present

## 2021-08-01 DIAGNOSIS — G894 Chronic pain syndrome: Secondary | ICD-10-CM | POA: Diagnosis not present

## 2021-08-01 DIAGNOSIS — R0781 Pleurodynia: Secondary | ICD-10-CM | POA: Diagnosis not present

## 2021-08-01 DIAGNOSIS — M545 Low back pain, unspecified: Secondary | ICD-10-CM | POA: Diagnosis not present

## 2021-08-02 DIAGNOSIS — M25551 Pain in right hip: Secondary | ICD-10-CM | POA: Diagnosis not present

## 2021-08-02 DIAGNOSIS — G894 Chronic pain syndrome: Secondary | ICD-10-CM | POA: Diagnosis not present

## 2021-08-02 DIAGNOSIS — M50321 Other cervical disc degeneration at C4-C5 level: Secondary | ICD-10-CM | POA: Diagnosis not present

## 2021-08-02 DIAGNOSIS — M50322 Other cervical disc degeneration at C5-C6 level: Secondary | ICD-10-CM | POA: Diagnosis not present

## 2021-08-02 DIAGNOSIS — M545 Low back pain, unspecified: Secondary | ICD-10-CM | POA: Diagnosis not present

## 2021-08-02 DIAGNOSIS — R0781 Pleurodynia: Secondary | ICD-10-CM | POA: Diagnosis not present

## 2021-08-02 DIAGNOSIS — M546 Pain in thoracic spine: Secondary | ICD-10-CM | POA: Diagnosis not present

## 2021-08-02 DIAGNOSIS — Z791 Long term (current) use of non-steroidal anti-inflammatories (NSAID): Secondary | ICD-10-CM | POA: Diagnosis not present

## 2021-08-02 DIAGNOSIS — G8929 Other chronic pain: Secondary | ICD-10-CM | POA: Diagnosis not present

## 2021-08-02 DIAGNOSIS — M797 Fibromyalgia: Secondary | ICD-10-CM | POA: Diagnosis not present

## 2021-08-08 DIAGNOSIS — F341 Dysthymic disorder: Secondary | ICD-10-CM | POA: Diagnosis not present

## 2021-08-08 DIAGNOSIS — R69 Illness, unspecified: Secondary | ICD-10-CM | POA: Diagnosis not present

## 2021-08-16 DIAGNOSIS — Z791 Long term (current) use of non-steroidal anti-inflammatories (NSAID): Secondary | ICD-10-CM | POA: Diagnosis not present

## 2021-08-16 DIAGNOSIS — Z9181 History of falling: Secondary | ICD-10-CM | POA: Diagnosis not present

## 2021-08-16 DIAGNOSIS — M546 Pain in thoracic spine: Secondary | ICD-10-CM | POA: Diagnosis not present

## 2021-08-16 DIAGNOSIS — R69 Illness, unspecified: Secondary | ICD-10-CM | POA: Diagnosis not present

## 2021-08-16 DIAGNOSIS — M797 Fibromyalgia: Secondary | ICD-10-CM | POA: Diagnosis not present

## 2021-08-16 DIAGNOSIS — M50321 Other cervical disc degeneration at C4-C5 level: Secondary | ICD-10-CM | POA: Diagnosis not present

## 2021-08-16 DIAGNOSIS — R0781 Pleurodynia: Secondary | ICD-10-CM | POA: Diagnosis not present

## 2021-08-16 DIAGNOSIS — M25551 Pain in right hip: Secondary | ICD-10-CM | POA: Diagnosis not present

## 2021-08-16 DIAGNOSIS — M545 Low back pain, unspecified: Secondary | ICD-10-CM | POA: Diagnosis not present

## 2021-08-16 DIAGNOSIS — F341 Dysthymic disorder: Secondary | ICD-10-CM | POA: Diagnosis not present

## 2021-08-16 DIAGNOSIS — G894 Chronic pain syndrome: Secondary | ICD-10-CM | POA: Diagnosis not present

## 2021-08-21 DIAGNOSIS — F341 Dysthymic disorder: Secondary | ICD-10-CM | POA: Diagnosis not present

## 2021-08-21 DIAGNOSIS — R69 Illness, unspecified: Secondary | ICD-10-CM | POA: Diagnosis not present

## 2021-08-21 DIAGNOSIS — Z20822 Contact with and (suspected) exposure to covid-19: Secondary | ICD-10-CM | POA: Diagnosis not present

## 2021-08-22 DIAGNOSIS — R69 Illness, unspecified: Secondary | ICD-10-CM | POA: Diagnosis not present

## 2021-08-22 DIAGNOSIS — F341 Dysthymic disorder: Secondary | ICD-10-CM | POA: Diagnosis not present

## 2021-08-30 DIAGNOSIS — F341 Dysthymic disorder: Secondary | ICD-10-CM | POA: Diagnosis not present

## 2021-08-30 DIAGNOSIS — R69 Illness, unspecified: Secondary | ICD-10-CM | POA: Diagnosis not present

## 2021-09-14 DIAGNOSIS — F341 Dysthymic disorder: Secondary | ICD-10-CM | POA: Diagnosis not present

## 2021-09-14 DIAGNOSIS — R69 Illness, unspecified: Secondary | ICD-10-CM | POA: Diagnosis not present

## 2021-09-18 DIAGNOSIS — R69 Illness, unspecified: Secondary | ICD-10-CM | POA: Diagnosis not present

## 2021-09-18 DIAGNOSIS — F341 Dysthymic disorder: Secondary | ICD-10-CM | POA: Diagnosis not present

## 2021-09-19 DIAGNOSIS — R69 Illness, unspecified: Secondary | ICD-10-CM | POA: Diagnosis not present

## 2021-09-19 DIAGNOSIS — F341 Dysthymic disorder: Secondary | ICD-10-CM | POA: Diagnosis not present

## 2021-09-22 DIAGNOSIS — I10 Essential (primary) hypertension: Secondary | ICD-10-CM | POA: Diagnosis not present

## 2021-09-22 DIAGNOSIS — E1169 Type 2 diabetes mellitus with other specified complication: Secondary | ICD-10-CM | POA: Diagnosis not present

## 2021-09-22 DIAGNOSIS — R350 Frequency of micturition: Secondary | ICD-10-CM | POA: Diagnosis not present

## 2021-09-22 DIAGNOSIS — E669 Obesity, unspecified: Secondary | ICD-10-CM | POA: Diagnosis not present

## 2021-09-22 DIAGNOSIS — L732 Hidradenitis suppurativa: Secondary | ICD-10-CM | POA: Diagnosis not present

## 2021-10-02 DIAGNOSIS — M79641 Pain in right hand: Secondary | ICD-10-CM | POA: Diagnosis not present

## 2021-10-02 DIAGNOSIS — M797 Fibromyalgia: Secondary | ICD-10-CM | POA: Diagnosis not present

## 2021-10-02 DIAGNOSIS — F341 Dysthymic disorder: Secondary | ICD-10-CM | POA: Diagnosis not present

## 2021-10-02 DIAGNOSIS — R69 Illness, unspecified: Secondary | ICD-10-CM | POA: Diagnosis not present

## 2021-10-02 DIAGNOSIS — Z79891 Long term (current) use of opiate analgesic: Secondary | ICD-10-CM | POA: Diagnosis not present

## 2021-10-02 DIAGNOSIS — I10 Essential (primary) hypertension: Secondary | ICD-10-CM | POA: Diagnosis not present

## 2021-10-02 DIAGNOSIS — M549 Dorsalgia, unspecified: Secondary | ICD-10-CM | POA: Diagnosis not present

## 2021-10-02 DIAGNOSIS — G894 Chronic pain syndrome: Secondary | ICD-10-CM | POA: Diagnosis not present

## 2021-10-09 DIAGNOSIS — R69 Illness, unspecified: Secondary | ICD-10-CM | POA: Diagnosis not present

## 2021-10-09 DIAGNOSIS — F341 Dysthymic disorder: Secondary | ICD-10-CM | POA: Diagnosis not present

## 2021-10-16 DIAGNOSIS — R69 Illness, unspecified: Secondary | ICD-10-CM | POA: Diagnosis not present

## 2021-10-16 DIAGNOSIS — F341 Dysthymic disorder: Secondary | ICD-10-CM | POA: Diagnosis not present

## 2021-10-17 DIAGNOSIS — F341 Dysthymic disorder: Secondary | ICD-10-CM | POA: Diagnosis not present

## 2021-10-17 DIAGNOSIS — R69 Illness, unspecified: Secondary | ICD-10-CM | POA: Diagnosis not present

## 2021-10-23 DIAGNOSIS — F341 Dysthymic disorder: Secondary | ICD-10-CM | POA: Diagnosis not present

## 2021-10-23 DIAGNOSIS — F411 Generalized anxiety disorder: Secondary | ICD-10-CM | POA: Diagnosis not present

## 2021-10-23 DIAGNOSIS — R69 Illness, unspecified: Secondary | ICD-10-CM | POA: Diagnosis not present

## 2021-10-24 DIAGNOSIS — B333 Retrovirus infections, not elsewhere classified: Secondary | ICD-10-CM | POA: Diagnosis not present

## 2021-10-24 DIAGNOSIS — R69 Illness, unspecified: Secondary | ICD-10-CM | POA: Diagnosis not present

## 2021-10-24 DIAGNOSIS — R339 Retention of urine, unspecified: Secondary | ICD-10-CM | POA: Diagnosis not present

## 2021-10-24 DIAGNOSIS — K529 Noninfective gastroenteritis and colitis, unspecified: Secondary | ICD-10-CM | POA: Diagnosis not present

## 2021-10-24 DIAGNOSIS — F3341 Major depressive disorder, recurrent, in partial remission: Secondary | ICD-10-CM | POA: Diagnosis not present

## 2021-10-30 DIAGNOSIS — R69 Illness, unspecified: Secondary | ICD-10-CM | POA: Diagnosis not present

## 2021-10-30 DIAGNOSIS — F411 Generalized anxiety disorder: Secondary | ICD-10-CM | POA: Diagnosis not present

## 2021-10-30 DIAGNOSIS — F341 Dysthymic disorder: Secondary | ICD-10-CM | POA: Diagnosis not present

## 2021-11-01 DIAGNOSIS — F411 Generalized anxiety disorder: Secondary | ICD-10-CM | POA: Diagnosis not present

## 2021-11-01 DIAGNOSIS — F341 Dysthymic disorder: Secondary | ICD-10-CM | POA: Diagnosis not present

## 2021-11-01 DIAGNOSIS — R69 Illness, unspecified: Secondary | ICD-10-CM | POA: Diagnosis not present

## 2021-11-06 DIAGNOSIS — F341 Dysthymic disorder: Secondary | ICD-10-CM | POA: Diagnosis not present

## 2021-11-06 DIAGNOSIS — R69 Illness, unspecified: Secondary | ICD-10-CM | POA: Diagnosis not present

## 2021-11-06 DIAGNOSIS — F411 Generalized anxiety disorder: Secondary | ICD-10-CM | POA: Diagnosis not present

## 2021-11-08 DIAGNOSIS — R339 Retention of urine, unspecified: Secondary | ICD-10-CM | POA: Diagnosis not present

## 2021-11-08 DIAGNOSIS — R600 Localized edema: Secondary | ICD-10-CM | POA: Diagnosis not present

## 2021-11-08 DIAGNOSIS — E1159 Type 2 diabetes mellitus with other circulatory complications: Secondary | ICD-10-CM | POA: Diagnosis not present

## 2021-11-08 DIAGNOSIS — I10 Essential (primary) hypertension: Secondary | ICD-10-CM | POA: Diagnosis not present

## 2021-11-08 DIAGNOSIS — I152 Hypertension secondary to endocrine disorders: Secondary | ICD-10-CM | POA: Diagnosis not present

## 2021-11-08 DIAGNOSIS — L732 Hidradenitis suppurativa: Secondary | ICD-10-CM | POA: Diagnosis not present

## 2021-11-13 DIAGNOSIS — R69 Illness, unspecified: Secondary | ICD-10-CM | POA: Diagnosis not present

## 2021-11-13 DIAGNOSIS — F411 Generalized anxiety disorder: Secondary | ICD-10-CM | POA: Diagnosis not present

## 2021-11-13 DIAGNOSIS — F341 Dysthymic disorder: Secondary | ICD-10-CM | POA: Diagnosis not present

## 2021-11-16 DIAGNOSIS — I252 Old myocardial infarction: Secondary | ICD-10-CM | POA: Diagnosis not present

## 2021-11-16 DIAGNOSIS — Z885 Allergy status to narcotic agent status: Secondary | ICD-10-CM | POA: Diagnosis not present

## 2021-11-16 DIAGNOSIS — S0990XA Unspecified injury of head, initial encounter: Secondary | ICD-10-CM | POA: Diagnosis not present

## 2021-11-16 DIAGNOSIS — S6991XA Unspecified injury of right wrist, hand and finger(s), initial encounter: Secondary | ICD-10-CM | POA: Diagnosis not present

## 2021-11-16 DIAGNOSIS — R69 Illness, unspecified: Secondary | ICD-10-CM | POA: Diagnosis not present

## 2021-11-16 DIAGNOSIS — S79811A Other specified injuries of right hip, initial encounter: Secondary | ICD-10-CM | POA: Diagnosis not present

## 2021-11-16 DIAGNOSIS — W1839XA Other fall on same level, initial encounter: Secondary | ICD-10-CM | POA: Diagnosis not present

## 2021-11-16 DIAGNOSIS — Y998 Other external cause status: Secondary | ICD-10-CM | POA: Diagnosis not present

## 2021-11-16 DIAGNOSIS — W19XXXA Unspecified fall, initial encounter: Secondary | ICD-10-CM | POA: Diagnosis not present

## 2021-11-16 DIAGNOSIS — S52501A Unspecified fracture of the lower end of right radius, initial encounter for closed fracture: Secondary | ICD-10-CM | POA: Diagnosis not present

## 2021-11-16 DIAGNOSIS — Y9259 Other trade areas as the place of occurrence of the external cause: Secondary | ICD-10-CM | POA: Diagnosis not present

## 2021-11-16 DIAGNOSIS — Z743 Need for continuous supervision: Secondary | ICD-10-CM | POA: Diagnosis not present

## 2021-11-16 DIAGNOSIS — R519 Headache, unspecified: Secondary | ICD-10-CM | POA: Diagnosis not present

## 2021-11-16 DIAGNOSIS — R079 Chest pain, unspecified: Secondary | ICD-10-CM | POA: Diagnosis not present

## 2021-11-16 DIAGNOSIS — Z882 Allergy status to sulfonamides status: Secondary | ICD-10-CM | POA: Diagnosis not present

## 2021-11-16 DIAGNOSIS — R0781 Pleurodynia: Secondary | ICD-10-CM | POA: Diagnosis not present

## 2021-11-16 DIAGNOSIS — S0592XA Unspecified injury of left eye and orbit, initial encounter: Secondary | ICD-10-CM | POA: Diagnosis not present

## 2021-11-16 DIAGNOSIS — Z888 Allergy status to other drugs, medicaments and biological substances status: Secondary | ICD-10-CM | POA: Diagnosis not present

## 2021-11-16 DIAGNOSIS — M25551 Pain in right hip: Secondary | ICD-10-CM | POA: Diagnosis not present

## 2021-11-16 DIAGNOSIS — S52551A Other extraarticular fracture of lower end of right radius, initial encounter for closed fracture: Secondary | ICD-10-CM | POA: Diagnosis not present

## 2021-11-16 DIAGNOSIS — M25531 Pain in right wrist: Secondary | ICD-10-CM | POA: Diagnosis not present

## 2021-11-16 DIAGNOSIS — M545 Low back pain, unspecified: Secondary | ICD-10-CM | POA: Diagnosis not present

## 2021-11-16 DIAGNOSIS — M79641 Pain in right hand: Secondary | ICD-10-CM | POA: Diagnosis not present

## 2021-11-16 DIAGNOSIS — H5712 Ocular pain, left eye: Secondary | ICD-10-CM | POA: Diagnosis not present

## 2021-11-27 DIAGNOSIS — F341 Dysthymic disorder: Secondary | ICD-10-CM | POA: Diagnosis not present

## 2021-11-27 DIAGNOSIS — F411 Generalized anxiety disorder: Secondary | ICD-10-CM | POA: Diagnosis not present

## 2021-11-27 DIAGNOSIS — R69 Illness, unspecified: Secondary | ICD-10-CM | POA: Diagnosis not present

## 2021-11-28 DIAGNOSIS — F411 Generalized anxiety disorder: Secondary | ICD-10-CM | POA: Diagnosis not present

## 2021-11-28 DIAGNOSIS — F341 Dysthymic disorder: Secondary | ICD-10-CM | POA: Diagnosis not present

## 2021-11-28 DIAGNOSIS — R69 Illness, unspecified: Secondary | ICD-10-CM | POA: Diagnosis not present

## 2021-11-29 DIAGNOSIS — E261 Secondary hyperaldosteronism: Secondary | ICD-10-CM | POA: Diagnosis not present

## 2021-11-29 DIAGNOSIS — I110 Hypertensive heart disease with heart failure: Secondary | ICD-10-CM | POA: Diagnosis not present

## 2021-11-29 DIAGNOSIS — F17210 Nicotine dependence, cigarettes, uncomplicated: Secondary | ICD-10-CM | POA: Diagnosis not present

## 2021-11-29 DIAGNOSIS — E039 Hypothyroidism, unspecified: Secondary | ICD-10-CM | POA: Diagnosis not present

## 2021-11-29 DIAGNOSIS — E119 Type 2 diabetes mellitus without complications: Secondary | ICD-10-CM | POA: Diagnosis not present

## 2021-11-29 DIAGNOSIS — I509 Heart failure, unspecified: Secondary | ICD-10-CM | POA: Diagnosis not present

## 2021-11-29 DIAGNOSIS — R69 Illness, unspecified: Secondary | ICD-10-CM | POA: Diagnosis not present

## 2021-11-29 DIAGNOSIS — Z008 Encounter for other general examination: Secondary | ICD-10-CM | POA: Diagnosis not present

## 2021-11-29 DIAGNOSIS — J449 Chronic obstructive pulmonary disease, unspecified: Secondary | ICD-10-CM | POA: Diagnosis not present

## 2021-11-29 DIAGNOSIS — B029 Zoster without complications: Secondary | ICD-10-CM | POA: Diagnosis not present

## 2021-11-29 DIAGNOSIS — E876 Hypokalemia: Secondary | ICD-10-CM | POA: Diagnosis not present

## 2021-11-29 DIAGNOSIS — Z6841 Body Mass Index (BMI) 40.0 and over, adult: Secondary | ICD-10-CM | POA: Diagnosis not present

## 2021-12-01 DIAGNOSIS — R609 Edema, unspecified: Secondary | ICD-10-CM | POA: Diagnosis not present

## 2021-12-01 DIAGNOSIS — R0602 Shortness of breath: Secondary | ICD-10-CM | POA: Diagnosis not present

## 2021-12-01 DIAGNOSIS — I10 Essential (primary) hypertension: Secondary | ICD-10-CM | POA: Diagnosis not present

## 2021-12-01 DIAGNOSIS — I739 Peripheral vascular disease, unspecified: Secondary | ICD-10-CM | POA: Diagnosis not present

## 2021-12-04 DIAGNOSIS — M79641 Pain in right hand: Secondary | ICD-10-CM | POA: Diagnosis not present

## 2021-12-11 DIAGNOSIS — R69 Illness, unspecified: Secondary | ICD-10-CM | POA: Diagnosis not present

## 2021-12-11 DIAGNOSIS — F411 Generalized anxiety disorder: Secondary | ICD-10-CM | POA: Diagnosis not present

## 2021-12-11 DIAGNOSIS — F341 Dysthymic disorder: Secondary | ICD-10-CM | POA: Diagnosis not present

## 2021-12-18 DIAGNOSIS — R69 Illness, unspecified: Secondary | ICD-10-CM | POA: Diagnosis not present

## 2021-12-18 DIAGNOSIS — F341 Dysthymic disorder: Secondary | ICD-10-CM | POA: Diagnosis not present

## 2021-12-18 DIAGNOSIS — F411 Generalized anxiety disorder: Secondary | ICD-10-CM | POA: Diagnosis not present

## 2021-12-21 DIAGNOSIS — F341 Dysthymic disorder: Secondary | ICD-10-CM | POA: Diagnosis not present

## 2021-12-21 DIAGNOSIS — R69 Illness, unspecified: Secondary | ICD-10-CM | POA: Diagnosis not present

## 2021-12-21 DIAGNOSIS — F411 Generalized anxiety disorder: Secondary | ICD-10-CM | POA: Diagnosis not present

## 2021-12-27 DIAGNOSIS — F341 Dysthymic disorder: Secondary | ICD-10-CM | POA: Diagnosis not present

## 2021-12-27 DIAGNOSIS — R69 Illness, unspecified: Secondary | ICD-10-CM | POA: Diagnosis not present

## 2021-12-27 DIAGNOSIS — F411 Generalized anxiety disorder: Secondary | ICD-10-CM | POA: Diagnosis not present

## 2022-01-01 DIAGNOSIS — F411 Generalized anxiety disorder: Secondary | ICD-10-CM | POA: Diagnosis not present

## 2022-01-01 DIAGNOSIS — R69 Illness, unspecified: Secondary | ICD-10-CM | POA: Diagnosis not present

## 2022-01-01 DIAGNOSIS — F341 Dysthymic disorder: Secondary | ICD-10-CM | POA: Diagnosis not present

## 2022-01-09 DIAGNOSIS — R69 Illness, unspecified: Secondary | ICD-10-CM | POA: Diagnosis not present

## 2022-01-09 DIAGNOSIS — F411 Generalized anxiety disorder: Secondary | ICD-10-CM | POA: Diagnosis not present

## 2022-01-09 DIAGNOSIS — F341 Dysthymic disorder: Secondary | ICD-10-CM | POA: Diagnosis not present

## 2022-01-22 DIAGNOSIS — F411 Generalized anxiety disorder: Secondary | ICD-10-CM | POA: Diagnosis not present

## 2022-01-22 DIAGNOSIS — F341 Dysthymic disorder: Secondary | ICD-10-CM | POA: Diagnosis not present

## 2022-01-22 DIAGNOSIS — R69 Illness, unspecified: Secondary | ICD-10-CM | POA: Diagnosis not present

## 2022-01-24 DIAGNOSIS — R69 Illness, unspecified: Secondary | ICD-10-CM | POA: Diagnosis not present

## 2022-01-24 DIAGNOSIS — F411 Generalized anxiety disorder: Secondary | ICD-10-CM | POA: Diagnosis not present

## 2022-01-24 DIAGNOSIS — F341 Dysthymic disorder: Secondary | ICD-10-CM | POA: Diagnosis not present

## 2022-01-29 DIAGNOSIS — F411 Generalized anxiety disorder: Secondary | ICD-10-CM | POA: Diagnosis not present

## 2022-01-29 DIAGNOSIS — F341 Dysthymic disorder: Secondary | ICD-10-CM | POA: Diagnosis not present

## 2022-01-29 DIAGNOSIS — R69 Illness, unspecified: Secondary | ICD-10-CM | POA: Diagnosis not present

## 2022-01-31 DIAGNOSIS — Z122 Encounter for screening for malignant neoplasm of respiratory organs: Secondary | ICD-10-CM | POA: Diagnosis not present

## 2022-01-31 DIAGNOSIS — R319 Hematuria, unspecified: Secondary | ICD-10-CM | POA: Diagnosis not present

## 2022-01-31 DIAGNOSIS — F17210 Nicotine dependence, cigarettes, uncomplicated: Secondary | ICD-10-CM | POA: Diagnosis not present

## 2022-01-31 DIAGNOSIS — R69 Illness, unspecified: Secondary | ICD-10-CM | POA: Diagnosis not present

## 2022-01-31 DIAGNOSIS — M5441 Lumbago with sciatica, right side: Secondary | ICD-10-CM | POA: Diagnosis not present

## 2022-01-31 DIAGNOSIS — R9389 Abnormal findings on diagnostic imaging of other specified body structures: Secondary | ICD-10-CM | POA: Diagnosis not present

## 2022-01-31 DIAGNOSIS — E1169 Type 2 diabetes mellitus with other specified complication: Secondary | ICD-10-CM | POA: Diagnosis not present

## 2022-01-31 DIAGNOSIS — N200 Calculus of kidney: Secondary | ICD-10-CM | POA: Diagnosis not present

## 2022-01-31 DIAGNOSIS — E669 Obesity, unspecified: Secondary | ICD-10-CM | POA: Diagnosis not present

## 2022-01-31 DIAGNOSIS — R300 Dysuria: Secondary | ICD-10-CM | POA: Diagnosis not present

## 2022-01-31 DIAGNOSIS — R1031 Right lower quadrant pain: Secondary | ICD-10-CM | POA: Diagnosis not present

## 2022-01-31 DIAGNOSIS — G8929 Other chronic pain: Secondary | ICD-10-CM | POA: Diagnosis not present

## 2022-01-31 DIAGNOSIS — I10 Essential (primary) hypertension: Secondary | ICD-10-CM | POA: Diagnosis not present

## 2022-02-05 DIAGNOSIS — Z791 Long term (current) use of non-steroidal anti-inflammatories (NSAID): Secondary | ICD-10-CM | POA: Diagnosis not present

## 2022-02-05 DIAGNOSIS — M545 Low back pain, unspecified: Secondary | ICD-10-CM | POA: Diagnosis not present

## 2022-02-05 DIAGNOSIS — E079 Disorder of thyroid, unspecified: Secondary | ICD-10-CM | POA: Diagnosis not present

## 2022-02-05 DIAGNOSIS — J45909 Unspecified asthma, uncomplicated: Secondary | ICD-10-CM | POA: Diagnosis not present

## 2022-02-05 DIAGNOSIS — M5459 Other low back pain: Secondary | ICD-10-CM | POA: Diagnosis not present

## 2022-02-05 DIAGNOSIS — Z888 Allergy status to other drugs, medicaments and biological substances status: Secondary | ICD-10-CM | POA: Diagnosis not present

## 2022-02-05 DIAGNOSIS — I10 Essential (primary) hypertension: Secondary | ICD-10-CM | POA: Diagnosis not present

## 2022-02-05 DIAGNOSIS — Z91041 Radiographic dye allergy status: Secondary | ICD-10-CM | POA: Diagnosis not present

## 2022-02-05 DIAGNOSIS — Z880 Allergy status to penicillin: Secondary | ICD-10-CM | POA: Diagnosis not present

## 2022-02-05 DIAGNOSIS — Z87892 Personal history of anaphylaxis: Secondary | ICD-10-CM | POA: Diagnosis not present

## 2022-02-05 DIAGNOSIS — R1031 Right lower quadrant pain: Secondary | ICD-10-CM | POA: Diagnosis not present

## 2022-02-05 DIAGNOSIS — M797 Fibromyalgia: Secondary | ICD-10-CM | POA: Diagnosis not present

## 2022-02-05 DIAGNOSIS — Z7951 Long term (current) use of inhaled steroids: Secondary | ICD-10-CM | POA: Diagnosis not present

## 2022-02-05 DIAGNOSIS — Z79899 Other long term (current) drug therapy: Secondary | ICD-10-CM | POA: Diagnosis not present

## 2022-02-05 DIAGNOSIS — Z8672 Personal history of thrombophlebitis: Secondary | ICD-10-CM | POA: Diagnosis not present

## 2022-02-05 DIAGNOSIS — Z885 Allergy status to narcotic agent status: Secondary | ICD-10-CM | POA: Diagnosis not present

## 2022-02-05 DIAGNOSIS — R69 Illness, unspecified: Secondary | ICD-10-CM | POA: Diagnosis not present

## 2022-02-05 DIAGNOSIS — Z882 Allergy status to sulfonamides status: Secondary | ICD-10-CM | POA: Diagnosis not present

## 2022-02-05 DIAGNOSIS — Z6841 Body Mass Index (BMI) 40.0 and over, adult: Secondary | ICD-10-CM | POA: Diagnosis not present

## 2022-02-05 DIAGNOSIS — E119 Type 2 diabetes mellitus without complications: Secondary | ICD-10-CM | POA: Diagnosis not present

## 2022-02-12 DIAGNOSIS — R69 Illness, unspecified: Secondary | ICD-10-CM | POA: Diagnosis not present

## 2022-02-12 DIAGNOSIS — F341 Dysthymic disorder: Secondary | ICD-10-CM | POA: Diagnosis not present

## 2022-02-12 DIAGNOSIS — F411 Generalized anxiety disorder: Secondary | ICD-10-CM | POA: Diagnosis not present

## 2022-02-15 DIAGNOSIS — F411 Generalized anxiety disorder: Secondary | ICD-10-CM | POA: Diagnosis not present

## 2022-02-15 DIAGNOSIS — R69 Illness, unspecified: Secondary | ICD-10-CM | POA: Diagnosis not present

## 2022-02-15 DIAGNOSIS — F341 Dysthymic disorder: Secondary | ICD-10-CM | POA: Diagnosis not present

## 2022-02-22 DIAGNOSIS — F341 Dysthymic disorder: Secondary | ICD-10-CM | POA: Diagnosis not present

## 2022-02-22 DIAGNOSIS — F411 Generalized anxiety disorder: Secondary | ICD-10-CM | POA: Diagnosis not present

## 2022-02-22 DIAGNOSIS — R69 Illness, unspecified: Secondary | ICD-10-CM | POA: Diagnosis not present

## 2022-03-01 DIAGNOSIS — F341 Dysthymic disorder: Secondary | ICD-10-CM | POA: Diagnosis not present

## 2022-03-01 DIAGNOSIS — F411 Generalized anxiety disorder: Secondary | ICD-10-CM | POA: Diagnosis not present

## 2022-03-01 DIAGNOSIS — R69 Illness, unspecified: Secondary | ICD-10-CM | POA: Diagnosis not present

## 2022-03-05 DIAGNOSIS — F341 Dysthymic disorder: Secondary | ICD-10-CM | POA: Diagnosis not present

## 2022-03-05 DIAGNOSIS — R69 Illness, unspecified: Secondary | ICD-10-CM | POA: Diagnosis not present

## 2022-03-05 DIAGNOSIS — F411 Generalized anxiety disorder: Secondary | ICD-10-CM | POA: Diagnosis not present

## 2022-03-06 DIAGNOSIS — F411 Generalized anxiety disorder: Secondary | ICD-10-CM | POA: Diagnosis not present

## 2022-03-06 DIAGNOSIS — R69 Illness, unspecified: Secondary | ICD-10-CM | POA: Diagnosis not present

## 2022-03-06 DIAGNOSIS — F341 Dysthymic disorder: Secondary | ICD-10-CM | POA: Diagnosis not present

## 2022-03-07 DIAGNOSIS — I10 Essential (primary) hypertension: Secondary | ICD-10-CM | POA: Diagnosis not present

## 2022-03-07 DIAGNOSIS — E559 Vitamin D deficiency, unspecified: Secondary | ICD-10-CM | POA: Diagnosis not present

## 2022-03-07 DIAGNOSIS — Z0289 Encounter for other administrative examinations: Secondary | ICD-10-CM | POA: Diagnosis not present

## 2022-03-07 DIAGNOSIS — M545 Low back pain, unspecified: Secondary | ICD-10-CM | POA: Diagnosis not present

## 2022-03-07 DIAGNOSIS — B029 Zoster without complications: Secondary | ICD-10-CM | POA: Diagnosis not present

## 2022-03-07 DIAGNOSIS — A609 Anogenital herpesviral infection, unspecified: Secondary | ICD-10-CM | POA: Diagnosis not present

## 2022-03-07 DIAGNOSIS — R69 Illness, unspecified: Secondary | ICD-10-CM | POA: Diagnosis not present

## 2022-03-07 DIAGNOSIS — E876 Hypokalemia: Secondary | ICD-10-CM | POA: Diagnosis not present

## 2022-03-07 DIAGNOSIS — Z23 Encounter for immunization: Secondary | ICD-10-CM | POA: Diagnosis not present

## 2022-03-19 DIAGNOSIS — F411 Generalized anxiety disorder: Secondary | ICD-10-CM | POA: Diagnosis not present

## 2022-03-19 DIAGNOSIS — F341 Dysthymic disorder: Secondary | ICD-10-CM | POA: Diagnosis not present

## 2022-03-19 DIAGNOSIS — R69 Illness, unspecified: Secondary | ICD-10-CM | POA: Diagnosis not present

## 2022-03-26 DIAGNOSIS — F411 Generalized anxiety disorder: Secondary | ICD-10-CM | POA: Diagnosis not present

## 2022-03-26 DIAGNOSIS — F341 Dysthymic disorder: Secondary | ICD-10-CM | POA: Diagnosis not present

## 2022-03-26 DIAGNOSIS — R69 Illness, unspecified: Secondary | ICD-10-CM | POA: Diagnosis not present

## 2022-04-02 DIAGNOSIS — F411 Generalized anxiety disorder: Secondary | ICD-10-CM | POA: Diagnosis not present

## 2022-04-02 DIAGNOSIS — F341 Dysthymic disorder: Secondary | ICD-10-CM | POA: Diagnosis not present

## 2022-04-02 DIAGNOSIS — R69 Illness, unspecified: Secondary | ICD-10-CM | POA: Diagnosis not present

## 2022-04-03 DIAGNOSIS — F341 Dysthymic disorder: Secondary | ICD-10-CM | POA: Diagnosis not present

## 2022-04-03 DIAGNOSIS — R69 Illness, unspecified: Secondary | ICD-10-CM | POA: Diagnosis not present

## 2022-04-03 DIAGNOSIS — G4733 Obstructive sleep apnea (adult) (pediatric): Secondary | ICD-10-CM | POA: Diagnosis not present

## 2022-04-03 DIAGNOSIS — F411 Generalized anxiety disorder: Secondary | ICD-10-CM | POA: Diagnosis not present

## 2022-04-16 DIAGNOSIS — F341 Dysthymic disorder: Secondary | ICD-10-CM | POA: Diagnosis not present

## 2022-04-16 DIAGNOSIS — R69 Illness, unspecified: Secondary | ICD-10-CM | POA: Diagnosis not present

## 2022-04-16 DIAGNOSIS — F411 Generalized anxiety disorder: Secondary | ICD-10-CM | POA: Diagnosis not present

## 2022-04-23 DIAGNOSIS — F411 Generalized anxiety disorder: Secondary | ICD-10-CM | POA: Diagnosis not present

## 2022-04-23 DIAGNOSIS — F341 Dysthymic disorder: Secondary | ICD-10-CM | POA: Diagnosis not present

## 2022-04-23 DIAGNOSIS — R69 Illness, unspecified: Secondary | ICD-10-CM | POA: Diagnosis not present

## 2022-04-30 DIAGNOSIS — F341 Dysthymic disorder: Secondary | ICD-10-CM | POA: Diagnosis not present

## 2022-04-30 DIAGNOSIS — F411 Generalized anxiety disorder: Secondary | ICD-10-CM | POA: Diagnosis not present

## 2022-04-30 DIAGNOSIS — R69 Illness, unspecified: Secondary | ICD-10-CM | POA: Diagnosis not present

## 2022-05-03 DIAGNOSIS — R69 Illness, unspecified: Secondary | ICD-10-CM | POA: Diagnosis not present

## 2022-05-03 DIAGNOSIS — F341 Dysthymic disorder: Secondary | ICD-10-CM | POA: Diagnosis not present

## 2022-05-03 DIAGNOSIS — F411 Generalized anxiety disorder: Secondary | ICD-10-CM | POA: Diagnosis not present

## 2022-05-07 DIAGNOSIS — R69 Illness, unspecified: Secondary | ICD-10-CM | POA: Diagnosis not present

## 2022-05-07 DIAGNOSIS — F341 Dysthymic disorder: Secondary | ICD-10-CM | POA: Diagnosis not present

## 2022-05-07 DIAGNOSIS — F411 Generalized anxiety disorder: Secondary | ICD-10-CM | POA: Diagnosis not present

## 2022-05-08 DIAGNOSIS — E119 Type 2 diabetes mellitus without complications: Secondary | ICD-10-CM | POA: Diagnosis not present

## 2022-05-08 DIAGNOSIS — G43009 Migraine without aura, not intractable, without status migrainosus: Secondary | ICD-10-CM | POA: Diagnosis not present

## 2022-05-08 DIAGNOSIS — H524 Presbyopia: Secondary | ICD-10-CM | POA: Diagnosis not present

## 2022-05-08 DIAGNOSIS — H2513 Age-related nuclear cataract, bilateral: Secondary | ICD-10-CM | POA: Diagnosis not present

## 2022-05-08 DIAGNOSIS — H526 Other disorders of refraction: Secondary | ICD-10-CM | POA: Diagnosis not present

## 2022-05-09 DIAGNOSIS — F411 Generalized anxiety disorder: Secondary | ICD-10-CM | POA: Diagnosis not present

## 2022-05-09 DIAGNOSIS — R69 Illness, unspecified: Secondary | ICD-10-CM | POA: Diagnosis not present

## 2022-05-09 DIAGNOSIS — F341 Dysthymic disorder: Secondary | ICD-10-CM | POA: Diagnosis not present

## 2022-05-10 DIAGNOSIS — R69 Illness, unspecified: Secondary | ICD-10-CM | POA: Diagnosis not present

## 2022-05-10 DIAGNOSIS — F17210 Nicotine dependence, cigarettes, uncomplicated: Secondary | ICD-10-CM | POA: Diagnosis not present

## 2022-05-10 DIAGNOSIS — Z131 Encounter for screening for diabetes mellitus: Secondary | ICD-10-CM | POA: Diagnosis not present

## 2022-05-10 DIAGNOSIS — M25552 Pain in left hip: Secondary | ICD-10-CM | POA: Diagnosis not present

## 2022-05-10 DIAGNOSIS — M129 Arthropathy, unspecified: Secondary | ICD-10-CM | POA: Diagnosis not present

## 2022-05-10 DIAGNOSIS — M25559 Pain in unspecified hip: Secondary | ICD-10-CM | POA: Diagnosis not present

## 2022-05-10 DIAGNOSIS — G8929 Other chronic pain: Secondary | ICD-10-CM | POA: Diagnosis not present

## 2022-05-10 DIAGNOSIS — Z6841 Body Mass Index (BMI) 40.0 and over, adult: Secondary | ICD-10-CM | POA: Diagnosis not present

## 2022-05-10 DIAGNOSIS — M25551 Pain in right hip: Secondary | ICD-10-CM | POA: Diagnosis not present

## 2022-05-10 DIAGNOSIS — F17200 Nicotine dependence, unspecified, uncomplicated: Secondary | ICD-10-CM | POA: Diagnosis not present

## 2022-05-10 DIAGNOSIS — M25562 Pain in left knee: Secondary | ICD-10-CM | POA: Diagnosis not present

## 2022-05-10 DIAGNOSIS — M25561 Pain in right knee: Secondary | ICD-10-CM | POA: Diagnosis not present

## 2022-05-10 DIAGNOSIS — Z79899 Other long term (current) drug therapy: Secondary | ICD-10-CM | POA: Diagnosis not present

## 2022-05-10 DIAGNOSIS — Z1159 Encounter for screening for other viral diseases: Secondary | ICD-10-CM | POA: Diagnosis not present

## 2022-05-10 DIAGNOSIS — E559 Vitamin D deficiency, unspecified: Secondary | ICD-10-CM | POA: Diagnosis not present

## 2022-05-10 DIAGNOSIS — M545 Low back pain, unspecified: Secondary | ICD-10-CM | POA: Diagnosis not present

## 2022-05-11 DIAGNOSIS — Z79899 Other long term (current) drug therapy: Secondary | ICD-10-CM | POA: Diagnosis not present

## 2022-05-14 DIAGNOSIS — F411 Generalized anxiety disorder: Secondary | ICD-10-CM | POA: Diagnosis not present

## 2022-05-14 DIAGNOSIS — R69 Illness, unspecified: Secondary | ICD-10-CM | POA: Diagnosis not present

## 2022-05-14 DIAGNOSIS — F341 Dysthymic disorder: Secondary | ICD-10-CM | POA: Diagnosis not present

## 2022-05-22 DIAGNOSIS — R69 Illness, unspecified: Secondary | ICD-10-CM | POA: Diagnosis not present

## 2022-05-22 DIAGNOSIS — F411 Generalized anxiety disorder: Secondary | ICD-10-CM | POA: Diagnosis not present

## 2022-05-22 DIAGNOSIS — F341 Dysthymic disorder: Secondary | ICD-10-CM | POA: Diagnosis not present

## 2022-05-23 DIAGNOSIS — Z6841 Body Mass Index (BMI) 40.0 and over, adult: Secondary | ICD-10-CM | POA: Diagnosis not present

## 2022-05-23 DIAGNOSIS — E119 Type 2 diabetes mellitus without complications: Secondary | ICD-10-CM | POA: Diagnosis not present

## 2022-05-23 DIAGNOSIS — M545 Low back pain, unspecified: Secondary | ICD-10-CM | POA: Diagnosis not present

## 2022-05-23 DIAGNOSIS — Z79899 Other long term (current) drug therapy: Secondary | ICD-10-CM | POA: Diagnosis not present

## 2022-05-23 DIAGNOSIS — G8929 Other chronic pain: Secondary | ICD-10-CM | POA: Diagnosis not present

## 2022-05-24 DIAGNOSIS — F411 Generalized anxiety disorder: Secondary | ICD-10-CM | POA: Diagnosis not present

## 2022-05-24 DIAGNOSIS — R69 Illness, unspecified: Secondary | ICD-10-CM | POA: Diagnosis not present

## 2022-05-24 DIAGNOSIS — F341 Dysthymic disorder: Secondary | ICD-10-CM | POA: Diagnosis not present

## 2022-05-24 DIAGNOSIS — J019 Acute sinusitis, unspecified: Secondary | ICD-10-CM | POA: Diagnosis not present

## 2022-05-24 DIAGNOSIS — B9689 Other specified bacterial agents as the cause of diseases classified elsewhere: Secondary | ICD-10-CM | POA: Diagnosis not present

## 2022-06-04 DIAGNOSIS — F411 Generalized anxiety disorder: Secondary | ICD-10-CM | POA: Diagnosis not present

## 2022-06-04 DIAGNOSIS — F341 Dysthymic disorder: Secondary | ICD-10-CM | POA: Diagnosis not present

## 2022-06-04 DIAGNOSIS — R69 Illness, unspecified: Secondary | ICD-10-CM | POA: Diagnosis not present

## 2022-06-06 DIAGNOSIS — G43009 Migraine without aura, not intractable, without status migrainosus: Secondary | ICD-10-CM | POA: Diagnosis not present

## 2022-06-06 DIAGNOSIS — H5711 Ocular pain, right eye: Secondary | ICD-10-CM | POA: Diagnosis not present

## 2022-06-11 DIAGNOSIS — F341 Dysthymic disorder: Secondary | ICD-10-CM | POA: Diagnosis not present

## 2022-06-11 DIAGNOSIS — F411 Generalized anxiety disorder: Secondary | ICD-10-CM | POA: Diagnosis not present

## 2022-06-11 DIAGNOSIS — R69 Illness, unspecified: Secondary | ICD-10-CM | POA: Diagnosis not present

## 2022-07-02 DIAGNOSIS — F341 Dysthymic disorder: Secondary | ICD-10-CM | POA: Diagnosis not present

## 2022-07-02 DIAGNOSIS — F411 Generalized anxiety disorder: Secondary | ICD-10-CM | POA: Diagnosis not present

## 2022-07-02 DIAGNOSIS — R69 Illness, unspecified: Secondary | ICD-10-CM | POA: Diagnosis not present

## 2022-07-04 DIAGNOSIS — R69 Illness, unspecified: Secondary | ICD-10-CM | POA: Diagnosis not present

## 2022-07-04 DIAGNOSIS — F341 Dysthymic disorder: Secondary | ICD-10-CM | POA: Diagnosis not present

## 2022-07-04 DIAGNOSIS — F411 Generalized anxiety disorder: Secondary | ICD-10-CM | POA: Diagnosis not present

## 2022-07-06 DIAGNOSIS — Z79899 Other long term (current) drug therapy: Secondary | ICD-10-CM | POA: Diagnosis not present

## 2022-07-06 DIAGNOSIS — M25569 Pain in unspecified knee: Secondary | ICD-10-CM | POA: Diagnosis not present

## 2022-07-06 DIAGNOSIS — E119 Type 2 diabetes mellitus without complications: Secondary | ICD-10-CM | POA: Diagnosis not present

## 2022-07-06 DIAGNOSIS — M25559 Pain in unspecified hip: Secondary | ICD-10-CM | POA: Diagnosis not present

## 2022-07-06 DIAGNOSIS — G8929 Other chronic pain: Secondary | ICD-10-CM | POA: Diagnosis not present

## 2022-07-06 DIAGNOSIS — Z6841 Body Mass Index (BMI) 40.0 and over, adult: Secondary | ICD-10-CM | POA: Diagnosis not present

## 2022-07-06 DIAGNOSIS — Z0000 Encounter for general adult medical examination without abnormal findings: Secondary | ICD-10-CM | POA: Diagnosis not present

## 2022-07-09 DIAGNOSIS — F341 Dysthymic disorder: Secondary | ICD-10-CM | POA: Diagnosis not present

## 2022-07-09 DIAGNOSIS — F411 Generalized anxiety disorder: Secondary | ICD-10-CM | POA: Diagnosis not present

## 2022-07-09 DIAGNOSIS — R69 Illness, unspecified: Secondary | ICD-10-CM | POA: Diagnosis not present

## 2022-07-10 DIAGNOSIS — Z79899 Other long term (current) drug therapy: Secondary | ICD-10-CM | POA: Diagnosis not present

## 2022-07-11 DIAGNOSIS — F341 Dysthymic disorder: Secondary | ICD-10-CM | POA: Diagnosis not present

## 2022-07-11 DIAGNOSIS — R69 Illness, unspecified: Secondary | ICD-10-CM | POA: Diagnosis not present

## 2022-07-11 DIAGNOSIS — F411 Generalized anxiety disorder: Secondary | ICD-10-CM | POA: Diagnosis not present

## 2022-07-17 ENCOUNTER — Emergency Department (HOSPITAL_COMMUNITY): Payer: Medicare HMO

## 2022-07-17 ENCOUNTER — Emergency Department (HOSPITAL_COMMUNITY)
Admission: EM | Admit: 2022-07-17 | Discharge: 2022-07-17 | Disposition: A | Discharge status: Home / Self Care | Payer: Medicare HMO | Attending: Emergency Medicine | Admitting: Emergency Medicine

## 2022-07-17 ENCOUNTER — Other Ambulatory Visit: Payer: Self-pay

## 2022-07-17 ENCOUNTER — Encounter (HOSPITAL_COMMUNITY): Payer: Self-pay | Admitting: Emergency Medicine

## 2022-07-17 DIAGNOSIS — F341 Dysthymic disorder: Secondary | ICD-10-CM | POA: Diagnosis not present

## 2022-07-17 DIAGNOSIS — R519 Headache, unspecified: Secondary | ICD-10-CM | POA: Diagnosis not present

## 2022-07-17 DIAGNOSIS — R079 Chest pain, unspecified: Secondary | ICD-10-CM | POA: Diagnosis not present

## 2022-07-17 DIAGNOSIS — K0889 Other specified disorders of teeth and supporting structures: Secondary | ICD-10-CM | POA: Insufficient documentation

## 2022-07-17 DIAGNOSIS — Z743 Need for continuous supervision: Secondary | ICD-10-CM | POA: Diagnosis not present

## 2022-07-17 DIAGNOSIS — R42 Dizziness and giddiness: Secondary | ICD-10-CM | POA: Diagnosis not present

## 2022-07-17 DIAGNOSIS — R69 Illness, unspecified: Secondary | ICD-10-CM | POA: Diagnosis not present

## 2022-07-17 DIAGNOSIS — H9201 Otalgia, right ear: Secondary | ICD-10-CM | POA: Diagnosis not present

## 2022-07-17 DIAGNOSIS — F411 Generalized anxiety disorder: Secondary | ICD-10-CM | POA: Diagnosis not present

## 2022-07-17 DIAGNOSIS — G4489 Other headache syndrome: Secondary | ICD-10-CM | POA: Diagnosis not present

## 2022-07-17 DIAGNOSIS — H6691 Otitis media, unspecified, right ear: Secondary | ICD-10-CM

## 2022-07-17 LAB — CBC WITH DIFFERENTIAL/PLATELET
Abs Immature Granulocytes: 0.04 10*3/uL (ref 0.00–0.07)
Basophils Absolute: 0.1 10*3/uL (ref 0.0–0.1)
Basophils Relative: 1 %
Eosinophils Absolute: 0.2 10*3/uL (ref 0.0–0.5)
Eosinophils Relative: 2 %
HCT: 46.3 % — ABNORMAL HIGH (ref 36.0–46.0)
Hemoglobin: 14.7 g/dL (ref 12.0–15.0)
Immature Granulocytes: 0 %
Lymphocytes Relative: 47 %
Lymphs Abs: 4.7 10*3/uL — ABNORMAL HIGH (ref 0.7–4.0)
MCH: 27.1 pg (ref 26.0–34.0)
MCHC: 31.7 g/dL (ref 30.0–36.0)
MCV: 85.4 fL (ref 80.0–100.0)
Monocytes Absolute: 0.6 10*3/uL (ref 0.1–1.0)
Monocytes Relative: 6 %
Neutro Abs: 4.5 10*3/uL (ref 1.7–7.7)
Neutrophils Relative %: 44 %
Platelets: 411 10*3/uL — ABNORMAL HIGH (ref 150–400)
RBC: 5.42 MIL/uL — ABNORMAL HIGH (ref 3.87–5.11)
RDW: 13.9 % (ref 11.5–15.5)
WBC: 10.1 10*3/uL (ref 4.0–10.5)
nRBC: 0 % (ref 0.0–0.2)

## 2022-07-17 LAB — URINALYSIS, ROUTINE W REFLEX MICROSCOPIC
Bilirubin Urine: NEGATIVE
Glucose, UA: NEGATIVE mg/dL
Hgb urine dipstick: NEGATIVE
Ketones, ur: NEGATIVE mg/dL
Leukocytes,Ua: NEGATIVE
Nitrite: NEGATIVE
Protein, ur: 30 mg/dL — AB
Specific Gravity, Urine: 1.018 (ref 1.005–1.030)
pH: 5 (ref 5.0–8.0)

## 2022-07-17 LAB — BASIC METABOLIC PANEL
Anion gap: 12 (ref 5–15)
BUN: 8 mg/dL (ref 6–20)
CO2: 27 mmol/L (ref 22–32)
Calcium: 9.5 mg/dL (ref 8.9–10.3)
Chloride: 101 mmol/L (ref 98–111)
Creatinine, Ser: 1.04 mg/dL — ABNORMAL HIGH (ref 0.44–1.00)
GFR, Estimated: >60 mL/min (ref >60)
Glucose, Bld: 96 mg/dL (ref 70–99)
Potassium: 3.9 mmol/L (ref 3.5–5.1)
Sodium: 140 mmol/L (ref 135–145)

## 2022-07-17 LAB — TROPONIN I (HIGH SENSITIVITY): Troponin I (High Sensitivity): 3 ng/L (ref <18)

## 2022-07-17 MED ORDER — AMOXICILLIN 500 MG PO CAPS: 500.0000 mg | ORAL_CAPSULE | Freq: Three times a day (TID) | ORAL | 0 refills | Status: DC

## 2022-07-17 NOTE — Discharge Instructions (Addendum)
Return if any problems.

## 2022-07-17 NOTE — ED Triage Notes (Addendum)
Pt bib [facility] for intermittent headache x2 weeks that extends down neck. Endorses dizziness.Negative stroke screen per ems. Denies n/v/d, shob, chest pain, blurred vision, or slurred speech. Pt also c/o intermittent right lymph node swelling   BP 133/78, HR 70, Spo2 96%, CBG 91

## 2022-07-17 NOTE — ED Notes (Signed)
Rn reviewed discharge instructions with pt. Pt verbalized understanding and had no further questions. VSS upon discharge 

## 2022-07-17 NOTE — ED Provider Notes (Signed)
[place] EMERGENCY DEPARTMENT AT [facility] Provider Note   CSN: [id number] Arrival date & time: 07/17/22  1515     History  Chief Complaint  Patient presents with   Headache    Brianna Roberts is a 57 y.o. female.  Pt complains of swelling below her right ear.  Pt reports she feels like she has a swollen gland.  Pt has had some ear pain, pain in her teeth and dizziness.  Pt reports she was treated for a sinus infection recently.  Pt has a past history of HTLV and she worries that she may have a brain tumor.  Pt concerned about her wbc count.  Pt reports husband died from cancer second to HTLV infection.  Pt reports increasing problems with   The history is provided by the patient. No language interpreter was used.  Headache Pain location:  Generalized Quality:  Unable to specify Radiates to:  Does not radiate Duration:  1 week Timing:  Constant Progression:  Worsening Similar to prior headaches: yes   Relieved by:  Nothing Worsened by:  Nothing Ineffective treatments:  None tried Associated symptoms: ear pain and hearing loss        Home Medications Prior to Admission medications   Medication Sig Start Date End Date Taking? Authorizing Provider  amoxicillin (AMOXIL) 500 MG capsule Take 1 capsule (500 mg total) by mouth 3 (three) times daily. 07/17/22  Yes Elson Areas, PA-C  acetaminophen (TYLENOL) 325 MG tablet Take 650 mg by mouth every 6 (six) hours as needed (for pain).    [provider]  acyclovir (ZOVIRAX) 200 MG capsule Take 1 capsule (200 mg total) by mouth 2 (two) times daily. For Outbreak: 2 capsules (400 mg) TID x 5 days. Patient taking differently: Take 400 mg by mouth 2 (two) times daily. 11/01/17   Porfirio Oar, PA  albuterol (PROVENTIL HFA;VENTOLIN HFA) 108 (90 Base) MCG/ACT inhaler Inhale 1-2 puffs into the lungs every 6 (six) hours as needed for wheezing or shortness of breath. 08/09/17   Porfirio Oar, PA  amitriptyline  (ELAVIL) 100 MG tablet Take 100 mg by mouth at bedtime. 04/17/21   [provider]  amLODipine (NORVASC) 10 MG tablet Take 1 tablet (10 mg total) by mouth daily. 08/21/17   Porfirio Oar, PA  amoxicillin-clavulanate (AUGMENTIN) 875-125 MG tablet Take 1 tablet by mouth 2 (two) times daily. 04/09/21   [provider]  ARIPiprazole (ABILIFY) 10 MG tablet Take 1 tablet (10 mg total) by mouth daily. 10/05/16   Porfirio Oar, PA  ASPERCREME LIDOCAINE EX Apply 1 application topically 2 (two) times daily as needed (for muscle pain).    [provider]  azelastine (ASTELIN) 0.1 % nasal spray USE 2 SPRAYS EACH NOSTRIL TWICE DAILY. Patient taking differently: Place 2 sprays into both nostrils 2 (two) times daily. 10/15/17   Porfirio Oar, PA  budesonide-formoterol (SYMBICORT) 160-4.5 MCG/ACT inhaler Inhale 2 puffs into the lungs 2 (two) times daily. Patient not taking: Reported on 04/22/2021 12/27/16   Porfirio Oar, PA  clobetasol ointment (TEMOVATE) 0.05 % Apply 1 application topically as needed (for [place]). 09/12/18   [provider]  dexamethasone (DECADRON) 2 MG tablet Take 5 tablets (10 mg total) by mouth once as needed for up to 1 dose. 04/22/21   Tilden Fossa, MD  diazepam (VALIUM) 5 MG tablet Take 1 tablet (5 mg total) by mouth every 8 (eight) hours as needed for anxiety. Patient taking differently: Take 5 mg by mouth  2 (two) times daily. 10/14/17   Harrison Mons, PA  doxycycline (VIBRAMYCIN) 100 MG capsule Take 1 capsule (100 mg total) by mouth 2 (two) times daily. 04/22/21   Quintella Reichert, MD  fluticasone (FLONASE) 50 MCG/ACT nasal spray USE 1 SPRAY IN EACH NOSTRIL DAILY. 04/02/17   Harrison Mons, PA  furosemide (LASIX) 40 MG tablet Take 1 tablet (40 mg total) by mouth 2 (two) times daily. Patient taking differently: Take 80 mg by mouth at bedtime. 08/09/17   Harrison Mons, PA  glucose blood test strip Use as instructed 04/17/17   Harrison Mons, PA   hydrOXYzine (ATARAX/VISTARIL) 10 MG tablet Take 1 tablet (10 mg total) by mouth at bedtime. Patient taking differently: Take 10 mg by mouth 2 (two) times daily. 10/05/16   Harrison Mons, PA  Incontinence Supply Disposable (PREVAIL WASHCLOTHS) MISC Use daily as directed 06/03/17   Harrison Mons, PA  ipratropium-albuterol (DUONEB) 0.5-2.5 (3) MG/3ML SOLN Take 3 mLs by nebulization every 6 (six) hours as needed (for breathing). Patient not taking: Reported on 04/22/2021 10/05/16   Harrison Mons, PA  Lancets Thin MISC Use to check home glucose daily 03/28/17   Harrison Mons, PA  levothyroxine (SYNTHROID, LEVOTHROID) 50 MCG tablet Take 1 tablet (50 mcg total) by mouth daily before breakfast. Patient taking differently: Take 50 mcg by mouth daily. 08/17/17   Harrison Mons, PA  losartan (COZAAR) 100 MG tablet Take 1 tablet (100 mg total) by mouth daily. Patient not taking: Reported on 04/22/2021 08/09/17   Harrison Mons, PA  meloxicam (MOBIC) 15 MG tablet TAKE 1 TABLET EACH DAY. Patient taking differently: Take 15 mg by mouth 2 (two) times daily as needed for pain. 11/08/17   Harrison Mons, PA  metoprolol tartrate (LOPRESSOR) 100 MG tablet Take 1 tablet (100 mg total) by mouth 2 (two) times daily. 08/09/17   Harrison Mons, PA  Nerve Stimulator (PRO COMFORT TENS UNIT) DEVI 1 Device by Does not apply route daily as needed. 12/27/16   Harrison Mons, PA  nystatin cream (MYCOSTATIN) Apply 1 application topically 2 (two) times daily.    [provider]  oxyCODONE (OXY IR/ROXICODONE) 5 MG immediate release tablet Take 1 tablet (5 mg total) by mouth every 4 (four) hours as needed for severe pain. 10/15/17   Harrison Mons, PA  potassium chloride SA (K-DUR,KLOR-CON) 20 MEQ tablet Take 1 tablet (20 mEq total) by mouth 2 (two) times daily. 06/24/17   Harrison Mons, PA  triamcinolone ointment (KENALOG) 0.5 % Apply 1 application topically daily.    [provider]  vitamin B-12  (CYANOCOBALAMIN) 100 MCG tablet Take 100 mcg by mouth once a week.    [provider]  Vitamin D, Ergocalciferol, (DRISDOL) 1.25 MG (50000 UNIT) CAPS capsule Take 50,000 Units by mouth once a week. On Saturday 03/07/21   [provider]      Allergies    Hydromorphone, Iodinated contrast media, Sulfa antibiotics, Codeine, Nickel, Penicillins, Spironolactone, Cefdinir, and Adhesive [tape]    Review of Systems   Review of Systems  HENT:  Positive for ear pain and hearing loss.   Neurological:  Positive for headaches.  All other systems reviewed and are negative.   Physical Exam Updated Vital Signs BP 127/79   Pulse 82   Temp 98.9 F (37.2 C) (Oral)   Resp 15   Ht 5\' 9"  (1.753 m)   Wt (!) 167.1 kg   SpO2 97%   BMI 54.39 kg/m  Physical Exam Vitals reviewed.  HENT:  Head: Normocephalic.  Cardiovascular:     Rate and Rhythm: Normal rate and regular rhythm.     Heart sounds: Normal heart sounds.  Pulmonary:     Effort: Pulmonary effort is normal.  Abdominal:     Palpations: Abdomen is soft.  Musculoskeletal:        General: Normal range of motion.  Skin:    General: Skin is warm.  Neurological:     Mental Status: She is alert.  Psychiatric:        Mood and Affect: Mood normal.     ED Results / Procedures / Treatments   Labs (all labs ordered are listed, but only abnormal results are displayed) Labs Reviewed  BASIC METABOLIC PANEL - Abnormal; Notable for the following components:      Result Value   Creatinine, Ser 1.04 (*)    All other components within normal limits  CBC WITH DIFFERENTIAL/PLATELET - Abnormal; Notable for the following components:   RBC 5.42 (*)    HCT 46.3 (*)    Platelets 411 (*)    Lymphs Abs 4.7 (*)    All other components within normal limits  URINALYSIS, ROUTINE W REFLEX MICROSCOPIC - Abnormal; Notable for the following components:   APPearance HAZY (*)    Protein, ur 30 (*)    Bacteria, UA MANY (*)    All other  components within normal limits  TROPONIN I (HIGH SENSITIVITY)  TROPONIN I (HIGH SENSITIVITY)    EKG None  Radiology CT Head Wo Contrast  Result Date: 07/17/2022 CLINICAL DATA:  Headache, new onset. EXAM: CT HEAD WITHOUT CONTRAST TECHNIQUE: Contiguous axial images were obtained from the base of the skull through the vertex without intravenous contrast. RADIATION DOSE REDUCTION: This exam was performed according to the departmental dose-optimization program which includes automated exposure control, adjustment of the mA and/or kV according to patient size and/or use of iterative reconstruction technique. COMPARISON:  Head CT 12/16/2017. FINDINGS: Brain: No acute hemorrhage, mass effect or midline shift. Gray-white differentiation is preserved. No hydrocephalus. No extra-axial collection. Basilar cisterns are patent. Partially empty, expanded sella. Vascular: No hyperdense vessel or unexpected calcification. Skull: No calvarial fracture or suspicious bone lesion. Skull base is unremarkable. Sinuses/Orbits: Paranasal sinuses, mastoid air cells, and middle ear cavities are well aerated. Orbits are unremarkable. Other: None. IMPRESSION: 1. No acute intracranial abnormality. 2. Partially empty, expanded sella. This can be seen in the setting of idiopathic intracranial hypertension. Electronically Signed   By: Emmit Alexanders M.D.   On: 07/17/2022 18:21   DG Chest 2 View  Result Date: 07/17/2022 CLINICAL DATA:  Chest pain EXAM: CHEST - 2 VIEW COMPARISON:  None Available. FINDINGS: No consolidation, pneumothorax or effusion. Normal cardiopericardial silhouette. No edema. Films are under penetrated. Degenerative changes of the spine. IMPRESSION: No acute cardiopulmonary disease Electronically Signed   By: Jill Side M.D.   On: 07/17/2022 17:07    Procedures Procedures    Medications Ordered in ED Medications - No data to display  ED Course/ Medical Decision Making/ A&P                              Medical Decision Making Pt complains of pain and swelling to neck under right ear.  Pt reports dizziness and ear discomfort.    Amount and/or Complexity of Data Reviewed External Data Reviewed: notes.    Details: Primary care notes reviewed Labs: ordered. Decision-making details documented in ED Course.  Details: Labs ordered reviewed and interpreted.    Risk Prescription drug management. Risk Details: Pt has a lymph node right neck.  Right ear tender to exam,  Pt reports she is allergic to multiple antibiotics.  Pt can take amoxicillian.  Pt has a primary care MD appointment on Friday.  I advised ot to have them recheck her ear.            Final Clinical Impression(s) / ED Diagnoses Final diagnoses:  Right otitis media, unspecified otitis media type    Rx / DC Orders ED Discharge Orders          Ordered    amoxicillin (AMOXIL) 500 MG capsule  3 times daily        07/17/22 2056           An After Visit Summary was printed and given to the patient.    Elson Areas, Cordelia Poche 07/17/22 2140    Margarita Grizzle, MD 07/18/22 [phone]

## 2022-07-17 NOTE — ED Provider Triage Note (Signed)
Emergency Medicine Provider Triage Evaluation Note  Brianna Roberts , a 57 y.o. female  was evaluated in triage.  Pt complains of headache x 2 weeks.  Notes that pain has moved to the neck.  Patient has a history of TIA.  Notes that she feels dizzy with ambulation.  Has associated chest pain.  Notes that she has had chest pain for almost a month.  Denies nausea, vomiting, shortness of breath.  Review of Systems  Positive:  Negative:   Physical Exam  BP (!) 139/96 (BP Location: Right Arm)   Pulse 75   Temp 98.7 F (37.1 C)   Resp 20   Ht 5\' 9"  (1.753 m)   Wt (!) 167.1 kg   SpO2 94%   BMI 54.39 kg/m  Gen:   Awake, no distress   Resp:  Normal effort  MSK:   Moves extremities without difficulty  Other:  Tenderness to palpation noted to left chest wall.  Grip strength 5/5 bilaterally.  Negative pronator drift.  Medical Decision Making  Medically screening exam initiated at 4:21 PM.  Appropriate orders placed.  Luwanna A Plain was informed that the remainder of the evaluation will be completed by another provider, this initial triage assessment does not replace that evaluation, and the importance of remaining in the ED until their evaluation is complete.  Workup initiated   Leon Montoya A, PA-C 07/17/22 1623

## 2022-07-18 DIAGNOSIS — F341 Dysthymic disorder: Secondary | ICD-10-CM | POA: Diagnosis not present

## 2022-07-18 DIAGNOSIS — R69 Illness, unspecified: Secondary | ICD-10-CM | POA: Diagnosis not present

## 2022-07-18 DIAGNOSIS — F411 Generalized anxiety disorder: Secondary | ICD-10-CM | POA: Diagnosis not present

## 2022-07-19 DIAGNOSIS — F411 Generalized anxiety disorder: Secondary | ICD-10-CM | POA: Diagnosis not present

## 2022-07-19 DIAGNOSIS — F341 Dysthymic disorder: Secondary | ICD-10-CM | POA: Diagnosis not present

## 2022-07-19 DIAGNOSIS — R69 Illness, unspecified: Secondary | ICD-10-CM | POA: Diagnosis not present

## 2022-07-20 DIAGNOSIS — B333 Retrovirus infections, not elsewhere classified: Secondary | ICD-10-CM | POA: Diagnosis not present

## 2022-07-20 DIAGNOSIS — Z6841 Body Mass Index (BMI) 40.0 and over, adult: Secondary | ICD-10-CM | POA: Diagnosis not present

## 2022-07-20 DIAGNOSIS — E669 Obesity, unspecified: Secondary | ICD-10-CM | POA: Diagnosis not present

## 2022-07-20 DIAGNOSIS — E1169 Type 2 diabetes mellitus with other specified complication: Secondary | ICD-10-CM | POA: Diagnosis not present

## 2022-07-24 ENCOUNTER — Telehealth: Payer: Self-pay | Admitting: *Deleted

## 2022-07-24 NOTE — Telephone Encounter (Signed)
     Patient  visit on 07/17/2022  at [facility] ed  was for ear pain  Have you been able to follow up with your primary care physician?  The patient Patient has been feeling better has had her follow up appt and says her visit was very good  Are there diet recommendations that you are having difficulty following?  Patient expresses understanding of discharge instructions and education provided has no other needs at this time. Yes  [place] [phone] [address] , [place] [address] Email : [email or web address]

## 2022-07-26 DIAGNOSIS — F411 Generalized anxiety disorder: Secondary | ICD-10-CM | POA: Diagnosis not present

## 2022-07-26 DIAGNOSIS — F341 Dysthymic disorder: Secondary | ICD-10-CM | POA: Diagnosis not present

## 2022-07-26 DIAGNOSIS — R69 Illness, unspecified: Secondary | ICD-10-CM | POA: Diagnosis not present

## 2022-07-30 DIAGNOSIS — J309 Allergic rhinitis, unspecified: Secondary | ICD-10-CM | POA: Diagnosis not present

## 2022-07-30 DIAGNOSIS — R220 Localized swelling, mass and lump, head: Secondary | ICD-10-CM | POA: Diagnosis not present

## 2022-07-30 DIAGNOSIS — J40 Bronchitis, not specified as acute or chronic: Secondary | ICD-10-CM | POA: Diagnosis not present

## 2022-08-02 DIAGNOSIS — E669 Obesity, unspecified: Secondary | ICD-10-CM | POA: Diagnosis not present

## 2022-08-02 DIAGNOSIS — E1169 Type 2 diabetes mellitus with other specified complication: Secondary | ICD-10-CM | POA: Diagnosis not present

## 2022-08-06 DIAGNOSIS — F341 Dysthymic disorder: Secondary | ICD-10-CM | POA: Diagnosis not present

## 2022-08-06 DIAGNOSIS — R69 Illness, unspecified: Secondary | ICD-10-CM | POA: Diagnosis not present

## 2022-08-06 DIAGNOSIS — F411 Generalized anxiety disorder: Secondary | ICD-10-CM | POA: Diagnosis not present

## 2022-08-08 DIAGNOSIS — F411 Generalized anxiety disorder: Secondary | ICD-10-CM | POA: Diagnosis not present

## 2022-08-08 DIAGNOSIS — R69 Illness, unspecified: Secondary | ICD-10-CM | POA: Diagnosis not present

## 2022-08-08 DIAGNOSIS — F341 Dysthymic disorder: Secondary | ICD-10-CM | POA: Diagnosis not present

## 2022-08-13 DIAGNOSIS — R69 Illness, unspecified: Secondary | ICD-10-CM | POA: Diagnosis not present

## 2022-08-13 DIAGNOSIS — F341 Dysthymic disorder: Secondary | ICD-10-CM | POA: Diagnosis not present

## 2022-08-14 DIAGNOSIS — R609 Edema, unspecified: Secondary | ICD-10-CM | POA: Diagnosis not present

## 2022-08-14 DIAGNOSIS — E785 Hyperlipidemia, unspecified: Secondary | ICD-10-CM | POA: Diagnosis not present

## 2022-08-14 DIAGNOSIS — M797 Fibromyalgia: Secondary | ICD-10-CM | POA: Diagnosis not present

## 2022-08-14 DIAGNOSIS — E538 Deficiency of other specified B group vitamins: Secondary | ICD-10-CM | POA: Diagnosis not present

## 2022-08-14 DIAGNOSIS — J4489 Other specified chronic obstructive pulmonary disease: Secondary | ICD-10-CM | POA: Diagnosis not present

## 2022-08-14 DIAGNOSIS — I10 Essential (primary) hypertension: Secondary | ICD-10-CM | POA: Diagnosis not present

## 2022-08-14 DIAGNOSIS — F419 Anxiety disorder, unspecified: Secondary | ICD-10-CM | POA: Diagnosis not present

## 2022-08-14 DIAGNOSIS — G4733 Obstructive sleep apnea (adult) (pediatric): Secondary | ICD-10-CM | POA: Diagnosis not present

## 2022-08-14 DIAGNOSIS — R69 Illness, unspecified: Secondary | ICD-10-CM | POA: Diagnosis not present

## 2022-08-14 DIAGNOSIS — M1990 Unspecified osteoarthritis, unspecified site: Secondary | ICD-10-CM | POA: Diagnosis not present

## 2022-08-14 DIAGNOSIS — J302 Other seasonal allergic rhinitis: Secondary | ICD-10-CM | POA: Diagnosis not present

## 2022-08-14 DIAGNOSIS — Z79891 Long term (current) use of opiate analgesic: Secondary | ICD-10-CM | POA: Diagnosis not present

## 2022-08-16 DIAGNOSIS — R69 Illness, unspecified: Secondary | ICD-10-CM | POA: Diagnosis not present

## 2022-08-16 DIAGNOSIS — F411 Generalized anxiety disorder: Secondary | ICD-10-CM | POA: Diagnosis not present

## 2022-08-16 DIAGNOSIS — F341 Dysthymic disorder: Secondary | ICD-10-CM | POA: Diagnosis not present

## 2022-08-20 DIAGNOSIS — F411 Generalized anxiety disorder: Secondary | ICD-10-CM | POA: Diagnosis not present

## 2022-08-20 DIAGNOSIS — F341 Dysthymic disorder: Secondary | ICD-10-CM | POA: Diagnosis not present

## 2022-08-20 DIAGNOSIS — R69 Illness, unspecified: Secondary | ICD-10-CM | POA: Diagnosis not present

## 2022-08-27 DIAGNOSIS — F341 Dysthymic disorder: Secondary | ICD-10-CM | POA: Diagnosis not present

## 2022-08-27 DIAGNOSIS — R69 Illness, unspecified: Secondary | ICD-10-CM | POA: Diagnosis not present

## 2022-09-03 DIAGNOSIS — F341 Dysthymic disorder: Secondary | ICD-10-CM | POA: Diagnosis not present

## 2022-09-03 DIAGNOSIS — R69 Illness, unspecified: Secondary | ICD-10-CM | POA: Diagnosis not present

## 2022-09-10 DIAGNOSIS — R69 Illness, unspecified: Secondary | ICD-10-CM | POA: Diagnosis not present

## 2022-09-10 DIAGNOSIS — F341 Dysthymic disorder: Secondary | ICD-10-CM | POA: Diagnosis not present

## 2022-09-18 DIAGNOSIS — R69 Illness, unspecified: Secondary | ICD-10-CM | POA: Diagnosis not present

## 2022-09-18 DIAGNOSIS — G8929 Other chronic pain: Secondary | ICD-10-CM | POA: Diagnosis not present

## 2022-09-18 DIAGNOSIS — F341 Dysthymic disorder: Secondary | ICD-10-CM | POA: Diagnosis not present

## 2022-09-18 DIAGNOSIS — Z6841 Body Mass Index (BMI) 40.0 and over, adult: Secondary | ICD-10-CM | POA: Diagnosis not present

## 2022-09-18 DIAGNOSIS — M25559 Pain in unspecified hip: Secondary | ICD-10-CM | POA: Diagnosis not present

## 2022-09-18 DIAGNOSIS — F411 Generalized anxiety disorder: Secondary | ICD-10-CM | POA: Diagnosis not present

## 2022-09-18 DIAGNOSIS — I10 Essential (primary) hypertension: Secondary | ICD-10-CM | POA: Diagnosis not present

## 2022-09-18 DIAGNOSIS — M25569 Pain in unspecified knee: Secondary | ICD-10-CM | POA: Diagnosis not present

## 2022-09-18 DIAGNOSIS — E119 Type 2 diabetes mellitus without complications: Secondary | ICD-10-CM | POA: Diagnosis not present

## 2022-09-19 DIAGNOSIS — F411 Generalized anxiety disorder: Secondary | ICD-10-CM | POA: Diagnosis not present

## 2022-09-19 DIAGNOSIS — R69 Illness, unspecified: Secondary | ICD-10-CM | POA: Diagnosis not present

## 2022-09-19 DIAGNOSIS — F341 Dysthymic disorder: Secondary | ICD-10-CM | POA: Diagnosis not present

## 2022-09-24 ENCOUNTER — Other Ambulatory Visit: Payer: Self-pay

## 2022-09-24 ENCOUNTER — Emergency Department (HOSPITAL_COMMUNITY): Payer: Medicare HMO

## 2022-09-24 ENCOUNTER — Emergency Department (HOSPITAL_COMMUNITY)
Admission: EM | Admit: 2022-09-24 | Discharge: 2022-09-24 | Discharge status: Against Medical Advice | Payer: Medicare HMO | Attending: Emergency Medicine | Admitting: Emergency Medicine

## 2022-09-24 DIAGNOSIS — Z5321 Procedure and treatment not carried out due to patient leaving prior to being seen by health care provider: Secondary | ICD-10-CM | POA: Diagnosis not present

## 2022-09-24 DIAGNOSIS — R630 Anorexia: Secondary | ICD-10-CM | POA: Diagnosis not present

## 2022-09-24 DIAGNOSIS — R69 Illness, unspecified: Secondary | ICD-10-CM | POA: Diagnosis not present

## 2022-09-24 DIAGNOSIS — F411 Generalized anxiety disorder: Secondary | ICD-10-CM | POA: Diagnosis not present

## 2022-09-24 DIAGNOSIS — R5381 Other malaise: Secondary | ICD-10-CM | POA: Insufficient documentation

## 2022-09-24 DIAGNOSIS — L989 Disorder of the skin and subcutaneous tissue, unspecified: Secondary | ICD-10-CM | POA: Insufficient documentation

## 2022-09-24 DIAGNOSIS — M549 Dorsalgia, unspecified: Secondary | ICD-10-CM | POA: Diagnosis not present

## 2022-09-24 DIAGNOSIS — Z20822 Contact with and (suspected) exposure to covid-19: Secondary | ICD-10-CM | POA: Diagnosis not present

## 2022-09-24 DIAGNOSIS — M545 Low back pain, unspecified: Secondary | ICD-10-CM | POA: Diagnosis not present

## 2022-09-24 DIAGNOSIS — F341 Dysthymic disorder: Secondary | ICD-10-CM | POA: Diagnosis not present

## 2022-09-24 LAB — CBC
HCT: 40.5 % (ref 36.0–46.0)
Hemoglobin: 13.3 g/dL (ref 12.0–15.0)
MCH: 27.8 pg (ref 26.0–34.0)
MCHC: 32.8 g/dL (ref 30.0–36.0)
MCV: 84.6 fL (ref 80.0–100.0)
Platelets: 297 10*3/uL (ref 150–400)
RBC: 4.79 MIL/uL (ref 3.87–5.11)
RDW: 13.9 % (ref 11.5–15.5)
WBC: 12.4 10*3/uL — ABNORMAL HIGH (ref 4.0–10.5)
nRBC: 0 % (ref 0.0–0.2)

## 2022-09-24 LAB — BASIC METABOLIC PANEL
Anion gap: 11 (ref 5–15)
BUN: 5 mg/dL — ABNORMAL LOW (ref 6–20)
CO2: 26 mmol/L (ref 22–32)
Calcium: 8.8 mg/dL — ABNORMAL LOW (ref 8.9–10.3)
Chloride: 101 mmol/L (ref 98–111)
Creatinine, Ser: 0.95 mg/dL (ref 0.44–1.00)
GFR, Estimated: >60 mL/min (ref >60)
Glucose, Bld: 113 mg/dL — ABNORMAL HIGH (ref 70–99)
Potassium: 3.2 mmol/L — ABNORMAL LOW (ref 3.5–5.1)
Sodium: 138 mmol/L (ref 135–145)

## 2022-09-24 LAB — RESP PANEL BY RT-PCR (RSV, FLU A&B, COVID)  RVPGX2
Influenza A by PCR: NEGATIVE
Influenza B by PCR: NEGATIVE
Resp Syncytial Virus by PCR: NEGATIVE
SARS Coronavirus 2 by RT PCR: NEGATIVE

## 2022-09-24 LAB — CBG MONITORING, ED: Glucose-Capillary: 109 mg/dL — ABNORMAL HIGH (ref 70–99)

## 2022-09-24 MED ORDER — ACETAMINOPHEN 325 MG PO TABS
650.0000 mg | ORAL_TABLET | Freq: Once | ORAL | Status: AC
  Administered 2022-09-24: 650 mg via ORAL
  Filled 2022-09-24: qty 2

## 2022-09-24 NOTE — ED Triage Notes (Signed)
Pt states she is having pain between her shoulder blades. Possible abscess. States it popped up in the past 3 days.

## 2022-09-24 NOTE — ED Notes (Signed)
Pt notified staff that she is leaving to go home. 

## 2022-09-24 NOTE — ED Provider Triage Note (Signed)
Emergency Medicine Provider Triage Evaluation Note  Brianna Roberts , a 57 y.o. female  was evaluated in triage.  Pt complains of red lesion and new back pain for 3 days, has swelling, reports generalized malaise, lack of appetite for last 2 days. Denies new numbness, weakness.  Review of Systems  Positive: Back pain, lesion Negative: Weakness, numbness  Physical Exam  BP 123/74   Pulse 100   Temp 99.9 F (37.7 C)   SpO2 96%  Gen:   Awake, no distress   Resp:  Normal effort  MSK:   Moves extremities without difficulty  Other:  3-4cm tender, red, non-fluctuant mass overlying midline thoracic spine  Medical Decision Making  Medically screening exam initiated at 6:01 PM.  Appropriate orders placed.  Natasia A Enoch was informed that the remainder of the evaluation will be completed by another provider, this initial triage assessment does not replace that evaluation, and the importance of remaining in the ED until their evaluation is complete.  Workup initiated in triage    Anselmo Pickler, [place] 09/24/22 1805

## 2022-09-28 DIAGNOSIS — L03818 Cellulitis of other sites: Secondary | ICD-10-CM | POA: Diagnosis not present

## 2022-10-02 DIAGNOSIS — L02212 Cutaneous abscess of back [any part, except buttock]: Secondary | ICD-10-CM | POA: Diagnosis not present

## 2022-10-04 DIAGNOSIS — Z7982 Long term (current) use of aspirin: Secondary | ICD-10-CM | POA: Diagnosis not present

## 2022-10-04 DIAGNOSIS — R42 Dizziness and giddiness: Secondary | ICD-10-CM | POA: Diagnosis not present

## 2022-10-04 DIAGNOSIS — Z91041 Radiographic dye allergy status: Secondary | ICD-10-CM | POA: Diagnosis not present

## 2022-10-04 DIAGNOSIS — L02212 Cutaneous abscess of back [any part, except buttock]: Secondary | ICD-10-CM | POA: Diagnosis not present

## 2022-10-04 DIAGNOSIS — M549 Dorsalgia, unspecified: Secondary | ICD-10-CM | POA: Diagnosis not present

## 2022-10-04 DIAGNOSIS — S20409D Unspecified superficial injuries of unspecified back wall of thorax, subsequent encounter: Secondary | ICD-10-CM | POA: Diagnosis not present

## 2022-10-04 DIAGNOSIS — I10 Essential (primary) hypertension: Secondary | ICD-10-CM | POA: Diagnosis not present

## 2022-10-04 DIAGNOSIS — Z87892 Personal history of anaphylaxis: Secondary | ICD-10-CM | POA: Diagnosis not present

## 2022-10-04 DIAGNOSIS — R531 Weakness: Secondary | ICD-10-CM | POA: Diagnosis not present

## 2022-10-04 DIAGNOSIS — M5030 Other cervical disc degeneration, unspecified cervical region: Secondary | ICD-10-CM | POA: Diagnosis not present

## 2022-10-04 DIAGNOSIS — Z888 Allergy status to other drugs, medicaments and biological substances status: Secondary | ICD-10-CM | POA: Diagnosis not present

## 2022-10-04 DIAGNOSIS — S31000A Unspecified open wound of lower back and pelvis without penetration into retroperitoneum, initial encounter: Secondary | ICD-10-CM | POA: Diagnosis not present

## 2022-10-04 DIAGNOSIS — Z8673 Personal history of transient ischemic attack (TIA), and cerebral infarction without residual deficits: Secondary | ICD-10-CM | POA: Diagnosis not present

## 2022-10-04 DIAGNOSIS — R112 Nausea with vomiting, unspecified: Secondary | ICD-10-CM | POA: Diagnosis not present

## 2022-10-04 DIAGNOSIS — Z882 Allergy status to sulfonamides status: Secondary | ICD-10-CM | POA: Diagnosis not present

## 2022-10-04 DIAGNOSIS — X58XXXD Exposure to other specified factors, subsequent encounter: Secondary | ICD-10-CM | POA: Diagnosis not present

## 2022-10-04 DIAGNOSIS — Z880 Allergy status to penicillin: Secondary | ICD-10-CM | POA: Diagnosis not present

## 2022-10-04 DIAGNOSIS — Z743 Need for continuous supervision: Secondary | ICD-10-CM | POA: Diagnosis not present

## 2022-10-04 DIAGNOSIS — Z79899 Other long term (current) drug therapy: Secondary | ICD-10-CM | POA: Diagnosis not present

## 2022-10-04 DIAGNOSIS — F17210 Nicotine dependence, cigarettes, uncomplicated: Secondary | ICD-10-CM | POA: Diagnosis not present

## 2022-10-04 DIAGNOSIS — E119 Type 2 diabetes mellitus without complications: Secondary | ICD-10-CM | POA: Diagnosis not present

## 2022-10-04 DIAGNOSIS — R110 Nausea: Secondary | ICD-10-CM | POA: Diagnosis not present

## 2022-10-08 DIAGNOSIS — F411 Generalized anxiety disorder: Secondary | ICD-10-CM | POA: Diagnosis not present

## 2022-10-08 DIAGNOSIS — R69 Illness, unspecified: Secondary | ICD-10-CM | POA: Diagnosis not present

## 2022-10-08 DIAGNOSIS — F341 Dysthymic disorder: Secondary | ICD-10-CM | POA: Diagnosis not present

## 2022-10-10 DIAGNOSIS — R69 Illness, unspecified: Secondary | ICD-10-CM | POA: Diagnosis not present

## 2022-10-10 DIAGNOSIS — F341 Dysthymic disorder: Secondary | ICD-10-CM | POA: Diagnosis not present

## 2022-10-10 DIAGNOSIS — F411 Generalized anxiety disorder: Secondary | ICD-10-CM | POA: Diagnosis not present

## 2022-10-12 DIAGNOSIS — G894 Chronic pain syndrome: Secondary | ICD-10-CM | POA: Diagnosis not present

## 2022-10-12 DIAGNOSIS — Z9181 History of falling: Secondary | ICD-10-CM | POA: Diagnosis not present

## 2022-10-12 DIAGNOSIS — N83209 Unspecified ovarian cyst, unspecified side: Secondary | ICD-10-CM | POA: Diagnosis not present

## 2022-10-12 DIAGNOSIS — M5136 Other intervertebral disc degeneration, lumbar region: Secondary | ICD-10-CM | POA: Diagnosis not present

## 2022-10-12 DIAGNOSIS — I10 Essential (primary) hypertension: Secondary | ICD-10-CM | POA: Diagnosis not present

## 2022-10-12 DIAGNOSIS — E039 Hypothyroidism, unspecified: Secondary | ICD-10-CM | POA: Diagnosis not present

## 2022-10-12 DIAGNOSIS — G4733 Obstructive sleep apnea (adult) (pediatric): Secondary | ICD-10-CM | POA: Diagnosis not present

## 2022-10-12 DIAGNOSIS — F339 Major depressive disorder, recurrent, unspecified: Secondary | ICD-10-CM | POA: Diagnosis not present

## 2022-10-12 DIAGNOSIS — G4700 Insomnia, unspecified: Secondary | ICD-10-CM | POA: Diagnosis not present

## 2022-10-12 DIAGNOSIS — E1151 Type 2 diabetes mellitus with diabetic peripheral angiopathy without gangrene: Secondary | ICD-10-CM | POA: Diagnosis not present

## 2022-10-12 DIAGNOSIS — Z7982 Long term (current) use of aspirin: Secondary | ICD-10-CM | POA: Diagnosis not present

## 2022-10-12 DIAGNOSIS — J45909 Unspecified asthma, uncomplicated: Secondary | ICD-10-CM | POA: Diagnosis not present

## 2022-10-12 DIAGNOSIS — G43909 Migraine, unspecified, not intractable, without status migrainosus: Secondary | ICD-10-CM | POA: Diagnosis not present

## 2022-10-12 DIAGNOSIS — M797 Fibromyalgia: Secondary | ICD-10-CM | POA: Diagnosis not present

## 2022-10-12 DIAGNOSIS — K589 Irritable bowel syndrome without diarrhea: Secondary | ICD-10-CM | POA: Diagnosis not present

## 2022-10-12 DIAGNOSIS — Z791 Long term (current) use of non-steroidal anti-inflammatories (NSAID): Secondary | ICD-10-CM | POA: Diagnosis not present

## 2022-10-12 DIAGNOSIS — B009 Herpesviral infection, unspecified: Secondary | ICD-10-CM | POA: Diagnosis not present

## 2022-10-12 DIAGNOSIS — G9332 Myalgic encephalomyelitis/chronic fatigue syndrome: Secondary | ICD-10-CM | POA: Diagnosis not present

## 2022-10-12 DIAGNOSIS — L02212 Cutaneous abscess of back [any part, except buttock]: Secondary | ICD-10-CM | POA: Diagnosis not present

## 2022-10-15 DIAGNOSIS — F341 Dysthymic disorder: Secondary | ICD-10-CM | POA: Diagnosis not present

## 2022-10-15 DIAGNOSIS — F411 Generalized anxiety disorder: Secondary | ICD-10-CM | POA: Diagnosis not present

## 2022-10-16 DIAGNOSIS — T8189XA Other complications of procedures, not elsewhere classified, initial encounter: Secondary | ICD-10-CM | POA: Diagnosis not present

## 2022-10-17 DIAGNOSIS — Z48817 Encounter for surgical aftercare following surgery on the skin and subcutaneous tissue: Secondary | ICD-10-CM | POA: Diagnosis not present

## 2022-10-30 DIAGNOSIS — Z9889 Other specified postprocedural states: Secondary | ICD-10-CM | POA: Diagnosis not present

## 2022-10-30 DIAGNOSIS — L03312 Cellulitis of back [any part except buttock]: Secondary | ICD-10-CM | POA: Diagnosis not present

## 2022-11-01 DIAGNOSIS — L02212 Cutaneous abscess of back [any part, except buttock]: Secondary | ICD-10-CM | POA: Diagnosis not present

## 2022-11-07 DIAGNOSIS — I152 Hypertension secondary to endocrine disorders: Secondary | ICD-10-CM | POA: Diagnosis not present

## 2022-11-07 DIAGNOSIS — G4730 Sleep apnea, unspecified: Secondary | ICD-10-CM | POA: Diagnosis not present

## 2022-11-07 DIAGNOSIS — F419 Anxiety disorder, unspecified: Secondary | ICD-10-CM | POA: Diagnosis not present

## 2022-11-07 DIAGNOSIS — E039 Hypothyroidism, unspecified: Secondary | ICD-10-CM | POA: Diagnosis not present

## 2022-11-07 DIAGNOSIS — Z7982 Long term (current) use of aspirin: Secondary | ICD-10-CM | POA: Diagnosis not present

## 2022-11-07 DIAGNOSIS — Z5181 Encounter for therapeutic drug level monitoring: Secondary | ICD-10-CM | POA: Diagnosis not present

## 2022-11-07 DIAGNOSIS — Z6841 Body Mass Index (BMI) 40.0 and over, adult: Secondary | ICD-10-CM | POA: Diagnosis not present

## 2022-11-07 DIAGNOSIS — B333 Retrovirus infections, not elsewhere classified: Secondary | ICD-10-CM | POA: Diagnosis not present

## 2022-11-07 DIAGNOSIS — Z90710 Acquired absence of both cervix and uterus: Secondary | ICD-10-CM | POA: Diagnosis not present

## 2022-11-07 DIAGNOSIS — L739 Follicular disorder, unspecified: Secondary | ICD-10-CM | POA: Diagnosis not present

## 2022-11-07 DIAGNOSIS — M1990 Unspecified osteoarthritis, unspecified site: Secondary | ICD-10-CM | POA: Diagnosis not present

## 2022-11-07 DIAGNOSIS — Z79899 Other long term (current) drug therapy: Secondary | ICD-10-CM | POA: Diagnosis not present

## 2022-11-07 DIAGNOSIS — G4733 Obstructive sleep apnea (adult) (pediatric): Secondary | ICD-10-CM | POA: Diagnosis not present

## 2022-11-07 DIAGNOSIS — Z7951 Long term (current) use of inhaled steroids: Secondary | ICD-10-CM | POA: Diagnosis not present

## 2022-11-07 DIAGNOSIS — R5382 Chronic fatigue, unspecified: Secondary | ICD-10-CM | POA: Diagnosis not present

## 2022-11-07 DIAGNOSIS — L02212 Cutaneous abscess of back [any part, except buttock]: Secondary | ICD-10-CM | POA: Diagnosis not present

## 2022-11-07 DIAGNOSIS — I10 Essential (primary) hypertension: Secondary | ICD-10-CM | POA: Diagnosis not present

## 2022-11-07 DIAGNOSIS — Z79890 Hormone replacement therapy (postmenopausal): Secondary | ICD-10-CM | POA: Diagnosis not present

## 2022-11-07 DIAGNOSIS — E1169 Type 2 diabetes mellitus with other specified complication: Secondary | ICD-10-CM | POA: Diagnosis not present

## 2022-11-07 DIAGNOSIS — E119 Type 2 diabetes mellitus without complications: Secondary | ICD-10-CM | POA: Diagnosis not present

## 2022-11-07 DIAGNOSIS — J45909 Unspecified asthma, uncomplicated: Secondary | ICD-10-CM | POA: Diagnosis not present

## 2022-11-07 DIAGNOSIS — M5030 Other cervical disc degeneration, unspecified cervical region: Secondary | ICD-10-CM | POA: Diagnosis not present

## 2022-11-07 DIAGNOSIS — L03312 Cellulitis of back [any part except buttock]: Secondary | ICD-10-CM | POA: Diagnosis not present

## 2022-11-07 DIAGNOSIS — F17210 Nicotine dependence, cigarettes, uncomplicated: Secondary | ICD-10-CM | POA: Diagnosis not present

## 2022-11-07 DIAGNOSIS — L0292 Furuncle, unspecified: Secondary | ICD-10-CM | POA: Diagnosis not present

## 2022-11-07 DIAGNOSIS — Z791 Long term (current) use of non-steroidal anti-inflammatories (NSAID): Secondary | ICD-10-CM | POA: Diagnosis not present

## 2022-11-07 DIAGNOSIS — Z9049 Acquired absence of other specified parts of digestive tract: Secondary | ICD-10-CM | POA: Diagnosis not present

## 2022-11-12 DIAGNOSIS — L02212 Cutaneous abscess of back [any part, except buttock]: Secondary | ICD-10-CM | POA: Diagnosis not present

## 2022-11-12 DIAGNOSIS — E079 Disorder of thyroid, unspecified: Secondary | ICD-10-CM | POA: Diagnosis not present

## 2022-11-12 DIAGNOSIS — G43909 Migraine, unspecified, not intractable, without status migrainosus: Secondary | ICD-10-CM | POA: Diagnosis not present

## 2022-11-12 DIAGNOSIS — Z8673 Personal history of transient ischemic attack (TIA), and cerebral infarction without residual deficits: Secondary | ICD-10-CM | POA: Diagnosis not present

## 2022-11-12 DIAGNOSIS — G4700 Insomnia, unspecified: Secondary | ICD-10-CM | POA: Diagnosis not present

## 2022-11-12 DIAGNOSIS — M5030 Other cervical disc degeneration, unspecified cervical region: Secondary | ICD-10-CM | POA: Diagnosis not present

## 2022-11-12 DIAGNOSIS — R2689 Other abnormalities of gait and mobility: Secondary | ICD-10-CM | POA: Diagnosis not present

## 2022-11-12 DIAGNOSIS — Z7951 Long term (current) use of inhaled steroids: Secondary | ICD-10-CM | POA: Diagnosis not present

## 2022-11-12 DIAGNOSIS — Z9181 History of falling: Secondary | ICD-10-CM | POA: Diagnosis not present

## 2022-11-12 DIAGNOSIS — M797 Fibromyalgia: Secondary | ICD-10-CM | POA: Diagnosis not present

## 2022-11-12 DIAGNOSIS — F17210 Nicotine dependence, cigarettes, uncomplicated: Secondary | ICD-10-CM | POA: Diagnosis not present

## 2022-11-12 DIAGNOSIS — L03312 Cellulitis of back [any part except buttock]: Secondary | ICD-10-CM | POA: Diagnosis not present

## 2022-11-12 DIAGNOSIS — E119 Type 2 diabetes mellitus without complications: Secondary | ICD-10-CM | POA: Diagnosis not present

## 2022-11-12 DIAGNOSIS — F331 Major depressive disorder, recurrent, moderate: Secondary | ICD-10-CM | POA: Diagnosis not present

## 2022-11-12 DIAGNOSIS — J45909 Unspecified asthma, uncomplicated: Secondary | ICD-10-CM | POA: Diagnosis not present

## 2022-11-12 DIAGNOSIS — F419 Anxiety disorder, unspecified: Secondary | ICD-10-CM | POA: Diagnosis not present

## 2022-11-12 DIAGNOSIS — L739 Follicular disorder, unspecified: Secondary | ICD-10-CM | POA: Diagnosis not present

## 2022-11-12 DIAGNOSIS — C9150 Adult T-cell lymphoma/leukemia (HTLV-1-associated) not having achieved remission: Secondary | ICD-10-CM | POA: Diagnosis not present

## 2022-11-12 DIAGNOSIS — I10 Essential (primary) hypertension: Secondary | ICD-10-CM | POA: Diagnosis not present

## 2022-11-12 DIAGNOSIS — Z7982 Long term (current) use of aspirin: Secondary | ICD-10-CM | POA: Diagnosis not present

## 2022-11-12 DIAGNOSIS — K589 Irritable bowel syndrome without diarrhea: Secondary | ICD-10-CM | POA: Diagnosis not present

## 2022-11-12 DIAGNOSIS — Z79899 Other long term (current) drug therapy: Secondary | ICD-10-CM | POA: Diagnosis not present

## 2022-11-14 DIAGNOSIS — Z7982 Long term (current) use of aspirin: Secondary | ICD-10-CM | POA: Diagnosis not present

## 2022-11-14 DIAGNOSIS — L739 Follicular disorder, unspecified: Secondary | ICD-10-CM | POA: Diagnosis not present

## 2022-11-14 DIAGNOSIS — Z7951 Long term (current) use of inhaled steroids: Secondary | ICD-10-CM | POA: Diagnosis not present

## 2022-11-14 DIAGNOSIS — G43909 Migraine, unspecified, not intractable, without status migrainosus: Secondary | ICD-10-CM | POA: Diagnosis not present

## 2022-11-14 DIAGNOSIS — K589 Irritable bowel syndrome without diarrhea: Secondary | ICD-10-CM | POA: Diagnosis not present

## 2022-11-14 DIAGNOSIS — Z8673 Personal history of transient ischemic attack (TIA), and cerebral infarction without residual deficits: Secondary | ICD-10-CM | POA: Diagnosis not present

## 2022-11-14 DIAGNOSIS — L02212 Cutaneous abscess of back [any part, except buttock]: Secondary | ICD-10-CM | POA: Diagnosis not present

## 2022-11-14 DIAGNOSIS — J45909 Unspecified asthma, uncomplicated: Secondary | ICD-10-CM | POA: Diagnosis not present

## 2022-11-14 DIAGNOSIS — I10 Essential (primary) hypertension: Secondary | ICD-10-CM | POA: Diagnosis not present

## 2022-11-14 DIAGNOSIS — C9150 Adult T-cell lymphoma/leukemia (HTLV-1-associated) not having achieved remission: Secondary | ICD-10-CM | POA: Diagnosis not present

## 2022-11-14 DIAGNOSIS — F419 Anxiety disorder, unspecified: Secondary | ICD-10-CM | POA: Diagnosis not present

## 2022-11-14 DIAGNOSIS — M797 Fibromyalgia: Secondary | ICD-10-CM | POA: Diagnosis not present

## 2022-11-14 DIAGNOSIS — Z79899 Other long term (current) drug therapy: Secondary | ICD-10-CM | POA: Diagnosis not present

## 2022-11-14 DIAGNOSIS — L03312 Cellulitis of back [any part except buttock]: Secondary | ICD-10-CM | POA: Diagnosis not present

## 2022-11-14 DIAGNOSIS — F331 Major depressive disorder, recurrent, moderate: Secondary | ICD-10-CM | POA: Diagnosis not present

## 2022-11-14 DIAGNOSIS — E119 Type 2 diabetes mellitus without complications: Secondary | ICD-10-CM | POA: Diagnosis not present

## 2022-11-14 DIAGNOSIS — R2689 Other abnormalities of gait and mobility: Secondary | ICD-10-CM | POA: Diagnosis not present

## 2022-11-14 DIAGNOSIS — Z9181 History of falling: Secondary | ICD-10-CM | POA: Diagnosis not present

## 2022-11-14 DIAGNOSIS — M5030 Other cervical disc degeneration, unspecified cervical region: Secondary | ICD-10-CM | POA: Diagnosis not present

## 2022-11-14 DIAGNOSIS — F17210 Nicotine dependence, cigarettes, uncomplicated: Secondary | ICD-10-CM | POA: Diagnosis not present

## 2022-11-14 DIAGNOSIS — E079 Disorder of thyroid, unspecified: Secondary | ICD-10-CM | POA: Diagnosis not present

## 2022-11-14 DIAGNOSIS — G4700 Insomnia, unspecified: Secondary | ICD-10-CM | POA: Diagnosis not present

## 2022-11-15 DIAGNOSIS — E1169 Type 2 diabetes mellitus with other specified complication: Secondary | ICD-10-CM | POA: Diagnosis not present

## 2022-11-15 DIAGNOSIS — I10 Essential (primary) hypertension: Secondary | ICD-10-CM | POA: Diagnosis not present

## 2022-11-15 DIAGNOSIS — B333 Retrovirus infections, not elsewhere classified: Secondary | ICD-10-CM | POA: Diagnosis not present

## 2022-11-15 DIAGNOSIS — L02212 Cutaneous abscess of back [any part, except buttock]: Secondary | ICD-10-CM | POA: Diagnosis not present

## 2022-11-15 DIAGNOSIS — L0292 Furuncle, unspecified: Secondary | ICD-10-CM | POA: Diagnosis not present

## 2022-11-15 DIAGNOSIS — E669 Obesity, unspecified: Secondary | ICD-10-CM | POA: Diagnosis not present

## 2022-11-16 DIAGNOSIS — C9150 Adult T-cell lymphoma/leukemia (HTLV-1-associated) not having achieved remission: Secondary | ICD-10-CM | POA: Diagnosis not present

## 2022-11-16 DIAGNOSIS — Z7951 Long term (current) use of inhaled steroids: Secondary | ICD-10-CM | POA: Diagnosis not present

## 2022-11-16 DIAGNOSIS — G4700 Insomnia, unspecified: Secondary | ICD-10-CM | POA: Diagnosis not present

## 2022-11-16 DIAGNOSIS — Z79899 Other long term (current) drug therapy: Secondary | ICD-10-CM | POA: Diagnosis not present

## 2022-11-16 DIAGNOSIS — L739 Follicular disorder, unspecified: Secondary | ICD-10-CM | POA: Diagnosis not present

## 2022-11-16 DIAGNOSIS — L02212 Cutaneous abscess of back [any part, except buttock]: Secondary | ICD-10-CM | POA: Diagnosis not present

## 2022-11-16 DIAGNOSIS — E119 Type 2 diabetes mellitus without complications: Secondary | ICD-10-CM | POA: Diagnosis not present

## 2022-11-16 DIAGNOSIS — L03312 Cellulitis of back [any part except buttock]: Secondary | ICD-10-CM | POA: Diagnosis not present

## 2022-11-16 DIAGNOSIS — F331 Major depressive disorder, recurrent, moderate: Secondary | ICD-10-CM | POA: Diagnosis not present

## 2022-11-16 DIAGNOSIS — G43909 Migraine, unspecified, not intractable, without status migrainosus: Secondary | ICD-10-CM | POA: Diagnosis not present

## 2022-11-16 DIAGNOSIS — F419 Anxiety disorder, unspecified: Secondary | ICD-10-CM | POA: Diagnosis not present

## 2022-11-16 DIAGNOSIS — K589 Irritable bowel syndrome without diarrhea: Secondary | ICD-10-CM | POA: Diagnosis not present

## 2022-11-16 DIAGNOSIS — Z7982 Long term (current) use of aspirin: Secondary | ICD-10-CM | POA: Diagnosis not present

## 2022-11-16 DIAGNOSIS — J45909 Unspecified asthma, uncomplicated: Secondary | ICD-10-CM | POA: Diagnosis not present

## 2022-11-16 DIAGNOSIS — R2689 Other abnormalities of gait and mobility: Secondary | ICD-10-CM | POA: Diagnosis not present

## 2022-11-16 DIAGNOSIS — Z9181 History of falling: Secondary | ICD-10-CM | POA: Diagnosis not present

## 2022-11-16 DIAGNOSIS — F17210 Nicotine dependence, cigarettes, uncomplicated: Secondary | ICD-10-CM | POA: Diagnosis not present

## 2022-11-16 DIAGNOSIS — E079 Disorder of thyroid, unspecified: Secondary | ICD-10-CM | POA: Diagnosis not present

## 2022-11-16 DIAGNOSIS — Z8673 Personal history of transient ischemic attack (TIA), and cerebral infarction without residual deficits: Secondary | ICD-10-CM | POA: Diagnosis not present

## 2022-11-16 DIAGNOSIS — M797 Fibromyalgia: Secondary | ICD-10-CM | POA: Diagnosis not present

## 2022-11-16 DIAGNOSIS — M5030 Other cervical disc degeneration, unspecified cervical region: Secondary | ICD-10-CM | POA: Diagnosis not present

## 2022-11-16 DIAGNOSIS — I10 Essential (primary) hypertension: Secondary | ICD-10-CM | POA: Diagnosis not present

## 2022-11-19 DIAGNOSIS — F419 Anxiety disorder, unspecified: Secondary | ICD-10-CM | POA: Diagnosis not present

## 2022-11-19 DIAGNOSIS — I10 Essential (primary) hypertension: Secondary | ICD-10-CM | POA: Diagnosis not present

## 2022-11-19 DIAGNOSIS — J45909 Unspecified asthma, uncomplicated: Secondary | ICD-10-CM | POA: Diagnosis not present

## 2022-11-19 DIAGNOSIS — M5030 Other cervical disc degeneration, unspecified cervical region: Secondary | ICD-10-CM | POA: Diagnosis not present

## 2022-11-19 DIAGNOSIS — E079 Disorder of thyroid, unspecified: Secondary | ICD-10-CM | POA: Diagnosis not present

## 2022-11-19 DIAGNOSIS — Z7951 Long term (current) use of inhaled steroids: Secondary | ICD-10-CM | POA: Diagnosis not present

## 2022-11-19 DIAGNOSIS — R2689 Other abnormalities of gait and mobility: Secondary | ICD-10-CM | POA: Diagnosis not present

## 2022-11-19 DIAGNOSIS — C9150 Adult T-cell lymphoma/leukemia (HTLV-1-associated) not having achieved remission: Secondary | ICD-10-CM | POA: Diagnosis not present

## 2022-11-19 DIAGNOSIS — F17210 Nicotine dependence, cigarettes, uncomplicated: Secondary | ICD-10-CM | POA: Diagnosis not present

## 2022-11-19 DIAGNOSIS — Z9181 History of falling: Secondary | ICD-10-CM | POA: Diagnosis not present

## 2022-11-19 DIAGNOSIS — L03312 Cellulitis of back [any part except buttock]: Secondary | ICD-10-CM | POA: Diagnosis not present

## 2022-11-19 DIAGNOSIS — Z7982 Long term (current) use of aspirin: Secondary | ICD-10-CM | POA: Diagnosis not present

## 2022-11-19 DIAGNOSIS — E119 Type 2 diabetes mellitus without complications: Secondary | ICD-10-CM | POA: Diagnosis not present

## 2022-11-19 DIAGNOSIS — K589 Irritable bowel syndrome without diarrhea: Secondary | ICD-10-CM | POA: Diagnosis not present

## 2022-11-19 DIAGNOSIS — F331 Major depressive disorder, recurrent, moderate: Secondary | ICD-10-CM | POA: Diagnosis not present

## 2022-11-19 DIAGNOSIS — G43909 Migraine, unspecified, not intractable, without status migrainosus: Secondary | ICD-10-CM | POA: Diagnosis not present

## 2022-11-19 DIAGNOSIS — L739 Follicular disorder, unspecified: Secondary | ICD-10-CM | POA: Diagnosis not present

## 2022-11-19 DIAGNOSIS — L02212 Cutaneous abscess of back [any part, except buttock]: Secondary | ICD-10-CM | POA: Diagnosis not present

## 2022-11-19 DIAGNOSIS — G4700 Insomnia, unspecified: Secondary | ICD-10-CM | POA: Diagnosis not present

## 2022-11-19 DIAGNOSIS — Z79899 Other long term (current) drug therapy: Secondary | ICD-10-CM | POA: Diagnosis not present

## 2022-11-19 DIAGNOSIS — Z8673 Personal history of transient ischemic attack (TIA), and cerebral infarction without residual deficits: Secondary | ICD-10-CM | POA: Diagnosis not present

## 2022-11-19 DIAGNOSIS — M797 Fibromyalgia: Secondary | ICD-10-CM | POA: Diagnosis not present

## 2022-11-21 DIAGNOSIS — G4700 Insomnia, unspecified: Secondary | ICD-10-CM | POA: Diagnosis not present

## 2022-11-21 DIAGNOSIS — L739 Follicular disorder, unspecified: Secondary | ICD-10-CM | POA: Diagnosis not present

## 2022-11-21 DIAGNOSIS — G43909 Migraine, unspecified, not intractable, without status migrainosus: Secondary | ICD-10-CM | POA: Diagnosis not present

## 2022-11-21 DIAGNOSIS — E079 Disorder of thyroid, unspecified: Secondary | ICD-10-CM | POA: Diagnosis not present

## 2022-11-21 DIAGNOSIS — M5030 Other cervical disc degeneration, unspecified cervical region: Secondary | ICD-10-CM | POA: Diagnosis not present

## 2022-11-21 DIAGNOSIS — Z7951 Long term (current) use of inhaled steroids: Secondary | ICD-10-CM | POA: Diagnosis not present

## 2022-11-21 DIAGNOSIS — M797 Fibromyalgia: Secondary | ICD-10-CM | POA: Diagnosis not present

## 2022-11-21 DIAGNOSIS — Z7982 Long term (current) use of aspirin: Secondary | ICD-10-CM | POA: Diagnosis not present

## 2022-11-21 DIAGNOSIS — F331 Major depressive disorder, recurrent, moderate: Secondary | ICD-10-CM | POA: Diagnosis not present

## 2022-11-21 DIAGNOSIS — Z8673 Personal history of transient ischemic attack (TIA), and cerebral infarction without residual deficits: Secondary | ICD-10-CM | POA: Diagnosis not present

## 2022-11-21 DIAGNOSIS — R2689 Other abnormalities of gait and mobility: Secondary | ICD-10-CM | POA: Diagnosis not present

## 2022-11-21 DIAGNOSIS — Z9181 History of falling: Secondary | ICD-10-CM | POA: Diagnosis not present

## 2022-11-21 DIAGNOSIS — L02212 Cutaneous abscess of back [any part, except buttock]: Secondary | ICD-10-CM | POA: Diagnosis not present

## 2022-11-21 DIAGNOSIS — F419 Anxiety disorder, unspecified: Secondary | ICD-10-CM | POA: Diagnosis not present

## 2022-11-21 DIAGNOSIS — F17210 Nicotine dependence, cigarettes, uncomplicated: Secondary | ICD-10-CM | POA: Diagnosis not present

## 2022-11-21 DIAGNOSIS — C9150 Adult T-cell lymphoma/leukemia (HTLV-1-associated) not having achieved remission: Secondary | ICD-10-CM | POA: Diagnosis not present

## 2022-11-21 DIAGNOSIS — Z79899 Other long term (current) drug therapy: Secondary | ICD-10-CM | POA: Diagnosis not present

## 2022-11-21 DIAGNOSIS — E119 Type 2 diabetes mellitus without complications: Secondary | ICD-10-CM | POA: Diagnosis not present

## 2022-11-21 DIAGNOSIS — J45909 Unspecified asthma, uncomplicated: Secondary | ICD-10-CM | POA: Diagnosis not present

## 2022-11-21 DIAGNOSIS — L03312 Cellulitis of back [any part except buttock]: Secondary | ICD-10-CM | POA: Diagnosis not present

## 2022-11-21 DIAGNOSIS — I10 Essential (primary) hypertension: Secondary | ICD-10-CM | POA: Diagnosis not present

## 2022-11-21 DIAGNOSIS — K589 Irritable bowel syndrome without diarrhea: Secondary | ICD-10-CM | POA: Diagnosis not present

## 2022-11-22 DIAGNOSIS — R2689 Other abnormalities of gait and mobility: Secondary | ICD-10-CM | POA: Diagnosis not present

## 2022-11-22 DIAGNOSIS — Z7951 Long term (current) use of inhaled steroids: Secondary | ICD-10-CM | POA: Diagnosis not present

## 2022-11-22 DIAGNOSIS — Z9181 History of falling: Secondary | ICD-10-CM | POA: Diagnosis not present

## 2022-11-22 DIAGNOSIS — J45909 Unspecified asthma, uncomplicated: Secondary | ICD-10-CM | POA: Diagnosis not present

## 2022-11-22 DIAGNOSIS — L03312 Cellulitis of back [any part except buttock]: Secondary | ICD-10-CM | POA: Diagnosis not present

## 2022-11-22 DIAGNOSIS — Z7982 Long term (current) use of aspirin: Secondary | ICD-10-CM | POA: Diagnosis not present

## 2022-11-22 DIAGNOSIS — K589 Irritable bowel syndrome without diarrhea: Secondary | ICD-10-CM | POA: Diagnosis not present

## 2022-11-22 DIAGNOSIS — E079 Disorder of thyroid, unspecified: Secondary | ICD-10-CM | POA: Diagnosis not present

## 2022-11-22 DIAGNOSIS — L02212 Cutaneous abscess of back [any part, except buttock]: Secondary | ICD-10-CM | POA: Diagnosis not present

## 2022-11-22 DIAGNOSIS — M5030 Other cervical disc degeneration, unspecified cervical region: Secondary | ICD-10-CM | POA: Diagnosis not present

## 2022-11-22 DIAGNOSIS — G43909 Migraine, unspecified, not intractable, without status migrainosus: Secondary | ICD-10-CM | POA: Diagnosis not present

## 2022-11-22 DIAGNOSIS — F419 Anxiety disorder, unspecified: Secondary | ICD-10-CM | POA: Diagnosis not present

## 2022-11-22 DIAGNOSIS — Z79899 Other long term (current) drug therapy: Secondary | ICD-10-CM | POA: Diagnosis not present

## 2022-11-22 DIAGNOSIS — L739 Follicular disorder, unspecified: Secondary | ICD-10-CM | POA: Diagnosis not present

## 2022-11-22 DIAGNOSIS — F331 Major depressive disorder, recurrent, moderate: Secondary | ICD-10-CM | POA: Diagnosis not present

## 2022-11-22 DIAGNOSIS — I10 Essential (primary) hypertension: Secondary | ICD-10-CM | POA: Diagnosis not present

## 2022-11-22 DIAGNOSIS — C9150 Adult T-cell lymphoma/leukemia (HTLV-1-associated) not having achieved remission: Secondary | ICD-10-CM | POA: Diagnosis not present

## 2022-11-22 DIAGNOSIS — G4700 Insomnia, unspecified: Secondary | ICD-10-CM | POA: Diagnosis not present

## 2022-11-22 DIAGNOSIS — E119 Type 2 diabetes mellitus without complications: Secondary | ICD-10-CM | POA: Diagnosis not present

## 2022-11-22 DIAGNOSIS — Z8673 Personal history of transient ischemic attack (TIA), and cerebral infarction without residual deficits: Secondary | ICD-10-CM | POA: Diagnosis not present

## 2022-11-22 DIAGNOSIS — F17210 Nicotine dependence, cigarettes, uncomplicated: Secondary | ICD-10-CM | POA: Diagnosis not present

## 2022-11-22 DIAGNOSIS — M797 Fibromyalgia: Secondary | ICD-10-CM | POA: Diagnosis not present

## 2022-11-23 DIAGNOSIS — L739 Follicular disorder, unspecified: Secondary | ICD-10-CM | POA: Diagnosis not present

## 2022-11-23 DIAGNOSIS — M797 Fibromyalgia: Secondary | ICD-10-CM | POA: Diagnosis not present

## 2022-11-23 DIAGNOSIS — L02212 Cutaneous abscess of back [any part, except buttock]: Secondary | ICD-10-CM | POA: Diagnosis not present

## 2022-11-23 DIAGNOSIS — E119 Type 2 diabetes mellitus without complications: Secondary | ICD-10-CM | POA: Diagnosis not present

## 2022-11-23 DIAGNOSIS — G4700 Insomnia, unspecified: Secondary | ICD-10-CM | POA: Diagnosis not present

## 2022-11-23 DIAGNOSIS — M5030 Other cervical disc degeneration, unspecified cervical region: Secondary | ICD-10-CM | POA: Diagnosis not present

## 2022-11-23 DIAGNOSIS — L02411 Cutaneous abscess of right axilla: Secondary | ICD-10-CM | POA: Diagnosis not present

## 2022-11-23 DIAGNOSIS — Z79899 Other long term (current) drug therapy: Secondary | ICD-10-CM | POA: Diagnosis not present

## 2022-11-23 DIAGNOSIS — C9150 Adult T-cell lymphoma/leukemia (HTLV-1-associated) not having achieved remission: Secondary | ICD-10-CM | POA: Diagnosis not present

## 2022-11-23 DIAGNOSIS — L03312 Cellulitis of back [any part except buttock]: Secondary | ICD-10-CM | POA: Diagnosis not present

## 2022-11-23 DIAGNOSIS — E079 Disorder of thyroid, unspecified: Secondary | ICD-10-CM | POA: Diagnosis not present

## 2022-11-23 DIAGNOSIS — Z7951 Long term (current) use of inhaled steroids: Secondary | ICD-10-CM | POA: Diagnosis not present

## 2022-11-23 DIAGNOSIS — F331 Major depressive disorder, recurrent, moderate: Secondary | ICD-10-CM | POA: Diagnosis not present

## 2022-11-23 DIAGNOSIS — Z8673 Personal history of transient ischemic attack (TIA), and cerebral infarction without residual deficits: Secondary | ICD-10-CM | POA: Diagnosis not present

## 2022-11-23 DIAGNOSIS — J45909 Unspecified asthma, uncomplicated: Secondary | ICD-10-CM | POA: Diagnosis not present

## 2022-11-23 DIAGNOSIS — R2689 Other abnormalities of gait and mobility: Secondary | ICD-10-CM | POA: Diagnosis not present

## 2022-11-23 DIAGNOSIS — G43909 Migraine, unspecified, not intractable, without status migrainosus: Secondary | ICD-10-CM | POA: Diagnosis not present

## 2022-11-23 DIAGNOSIS — Z7982 Long term (current) use of aspirin: Secondary | ICD-10-CM | POA: Diagnosis not present

## 2022-11-23 DIAGNOSIS — F419 Anxiety disorder, unspecified: Secondary | ICD-10-CM | POA: Diagnosis not present

## 2022-11-23 DIAGNOSIS — Z09 Encounter for follow-up examination after completed treatment for conditions other than malignant neoplasm: Secondary | ICD-10-CM | POA: Diagnosis not present

## 2022-11-23 DIAGNOSIS — I10 Essential (primary) hypertension: Secondary | ICD-10-CM | POA: Diagnosis not present

## 2022-11-23 DIAGNOSIS — Z9181 History of falling: Secondary | ICD-10-CM | POA: Diagnosis not present

## 2022-11-23 DIAGNOSIS — K589 Irritable bowel syndrome without diarrhea: Secondary | ICD-10-CM | POA: Diagnosis not present

## 2022-11-23 DIAGNOSIS — F17210 Nicotine dependence, cigarettes, uncomplicated: Secondary | ICD-10-CM | POA: Diagnosis not present

## 2022-11-27 DIAGNOSIS — Z48817 Encounter for surgical aftercare following surgery on the skin and subcutaneous tissue: Secondary | ICD-10-CM | POA: Diagnosis not present

## 2022-11-28 DIAGNOSIS — F419 Anxiety disorder, unspecified: Secondary | ICD-10-CM | POA: Diagnosis not present

## 2022-11-28 DIAGNOSIS — J45909 Unspecified asthma, uncomplicated: Secondary | ICD-10-CM | POA: Diagnosis not present

## 2022-11-28 DIAGNOSIS — M5030 Other cervical disc degeneration, unspecified cervical region: Secondary | ICD-10-CM | POA: Diagnosis not present

## 2022-11-28 DIAGNOSIS — L02212 Cutaneous abscess of back [any part, except buttock]: Secondary | ICD-10-CM | POA: Diagnosis not present

## 2022-11-28 DIAGNOSIS — Z9181 History of falling: Secondary | ICD-10-CM | POA: Diagnosis not present

## 2022-11-28 DIAGNOSIS — K589 Irritable bowel syndrome without diarrhea: Secondary | ICD-10-CM | POA: Diagnosis not present

## 2022-11-28 DIAGNOSIS — Z7951 Long term (current) use of inhaled steroids: Secondary | ICD-10-CM | POA: Diagnosis not present

## 2022-11-28 DIAGNOSIS — C9150 Adult T-cell lymphoma/leukemia (HTLV-1-associated) not having achieved remission: Secondary | ICD-10-CM | POA: Diagnosis not present

## 2022-11-28 DIAGNOSIS — G43909 Migraine, unspecified, not intractable, without status migrainosus: Secondary | ICD-10-CM | POA: Diagnosis not present

## 2022-11-28 DIAGNOSIS — F17210 Nicotine dependence, cigarettes, uncomplicated: Secondary | ICD-10-CM | POA: Diagnosis not present

## 2022-11-28 DIAGNOSIS — G4700 Insomnia, unspecified: Secondary | ICD-10-CM | POA: Diagnosis not present

## 2022-11-28 DIAGNOSIS — Z79899 Other long term (current) drug therapy: Secondary | ICD-10-CM | POA: Diagnosis not present

## 2022-11-28 DIAGNOSIS — R2689 Other abnormalities of gait and mobility: Secondary | ICD-10-CM | POA: Diagnosis not present

## 2022-11-28 DIAGNOSIS — L03312 Cellulitis of back [any part except buttock]: Secondary | ICD-10-CM | POA: Diagnosis not present

## 2022-11-28 DIAGNOSIS — Z7982 Long term (current) use of aspirin: Secondary | ICD-10-CM | POA: Diagnosis not present

## 2022-11-28 DIAGNOSIS — E079 Disorder of thyroid, unspecified: Secondary | ICD-10-CM | POA: Diagnosis not present

## 2022-11-28 DIAGNOSIS — M797 Fibromyalgia: Secondary | ICD-10-CM | POA: Diagnosis not present

## 2022-11-28 DIAGNOSIS — I10 Essential (primary) hypertension: Secondary | ICD-10-CM | POA: Diagnosis not present

## 2022-11-28 DIAGNOSIS — Z8673 Personal history of transient ischemic attack (TIA), and cerebral infarction without residual deficits: Secondary | ICD-10-CM | POA: Diagnosis not present

## 2022-11-28 DIAGNOSIS — F331 Major depressive disorder, recurrent, moderate: Secondary | ICD-10-CM | POA: Diagnosis not present

## 2022-11-28 DIAGNOSIS — L739 Follicular disorder, unspecified: Secondary | ICD-10-CM | POA: Diagnosis not present

## 2022-11-28 DIAGNOSIS — E119 Type 2 diabetes mellitus without complications: Secondary | ICD-10-CM | POA: Diagnosis not present

## 2022-11-29 DIAGNOSIS — G43909 Migraine, unspecified, not intractable, without status migrainosus: Secondary | ICD-10-CM | POA: Diagnosis not present

## 2022-11-29 DIAGNOSIS — G4700 Insomnia, unspecified: Secondary | ICD-10-CM | POA: Diagnosis not present

## 2022-11-29 DIAGNOSIS — E079 Disorder of thyroid, unspecified: Secondary | ICD-10-CM | POA: Diagnosis not present

## 2022-11-29 DIAGNOSIS — Z79899 Other long term (current) drug therapy: Secondary | ICD-10-CM | POA: Diagnosis not present

## 2022-11-29 DIAGNOSIS — M5030 Other cervical disc degeneration, unspecified cervical region: Secondary | ICD-10-CM | POA: Diagnosis not present

## 2022-11-29 DIAGNOSIS — F419 Anxiety disorder, unspecified: Secondary | ICD-10-CM | POA: Diagnosis not present

## 2022-11-29 DIAGNOSIS — L739 Follicular disorder, unspecified: Secondary | ICD-10-CM | POA: Diagnosis not present

## 2022-11-29 DIAGNOSIS — M797 Fibromyalgia: Secondary | ICD-10-CM | POA: Diagnosis not present

## 2022-11-29 DIAGNOSIS — Z7982 Long term (current) use of aspirin: Secondary | ICD-10-CM | POA: Diagnosis not present

## 2022-11-29 DIAGNOSIS — F17210 Nicotine dependence, cigarettes, uncomplicated: Secondary | ICD-10-CM | POA: Diagnosis not present

## 2022-11-29 DIAGNOSIS — Z8673 Personal history of transient ischemic attack (TIA), and cerebral infarction without residual deficits: Secondary | ICD-10-CM | POA: Diagnosis not present

## 2022-11-29 DIAGNOSIS — L02212 Cutaneous abscess of back [any part, except buttock]: Secondary | ICD-10-CM | POA: Diagnosis not present

## 2022-11-29 DIAGNOSIS — Z7951 Long term (current) use of inhaled steroids: Secondary | ICD-10-CM | POA: Diagnosis not present

## 2022-11-29 DIAGNOSIS — I10 Essential (primary) hypertension: Secondary | ICD-10-CM | POA: Diagnosis not present

## 2022-11-29 DIAGNOSIS — F331 Major depressive disorder, recurrent, moderate: Secondary | ICD-10-CM | POA: Diagnosis not present

## 2022-11-29 DIAGNOSIS — J45909 Unspecified asthma, uncomplicated: Secondary | ICD-10-CM | POA: Diagnosis not present

## 2022-11-29 DIAGNOSIS — Z9181 History of falling: Secondary | ICD-10-CM | POA: Diagnosis not present

## 2022-11-29 DIAGNOSIS — C9150 Adult T-cell lymphoma/leukemia (HTLV-1-associated) not having achieved remission: Secondary | ICD-10-CM | POA: Diagnosis not present

## 2022-11-29 DIAGNOSIS — R2689 Other abnormalities of gait and mobility: Secondary | ICD-10-CM | POA: Diagnosis not present

## 2022-11-29 DIAGNOSIS — L03312 Cellulitis of back [any part except buttock]: Secondary | ICD-10-CM | POA: Diagnosis not present

## 2022-11-29 DIAGNOSIS — K589 Irritable bowel syndrome without diarrhea: Secondary | ICD-10-CM | POA: Diagnosis not present

## 2022-11-29 DIAGNOSIS — E119 Type 2 diabetes mellitus without complications: Secondary | ICD-10-CM | POA: Diagnosis not present

## 2022-11-30 DIAGNOSIS — K589 Irritable bowel syndrome without diarrhea: Secondary | ICD-10-CM | POA: Diagnosis not present

## 2022-11-30 DIAGNOSIS — Z79899 Other long term (current) drug therapy: Secondary | ICD-10-CM | POA: Diagnosis not present

## 2022-11-30 DIAGNOSIS — E119 Type 2 diabetes mellitus without complications: Secondary | ICD-10-CM | POA: Diagnosis not present

## 2022-11-30 DIAGNOSIS — Z7951 Long term (current) use of inhaled steroids: Secondary | ICD-10-CM | POA: Diagnosis not present

## 2022-11-30 DIAGNOSIS — J45909 Unspecified asthma, uncomplicated: Secondary | ICD-10-CM | POA: Diagnosis not present

## 2022-11-30 DIAGNOSIS — G43909 Migraine, unspecified, not intractable, without status migrainosus: Secondary | ICD-10-CM | POA: Diagnosis not present

## 2022-11-30 DIAGNOSIS — F331 Major depressive disorder, recurrent, moderate: Secondary | ICD-10-CM | POA: Diagnosis not present

## 2022-11-30 DIAGNOSIS — E079 Disorder of thyroid, unspecified: Secondary | ICD-10-CM | POA: Diagnosis not present

## 2022-11-30 DIAGNOSIS — I10 Essential (primary) hypertension: Secondary | ICD-10-CM | POA: Diagnosis not present

## 2022-11-30 DIAGNOSIS — F17210 Nicotine dependence, cigarettes, uncomplicated: Secondary | ICD-10-CM | POA: Diagnosis not present

## 2022-11-30 DIAGNOSIS — G4700 Insomnia, unspecified: Secondary | ICD-10-CM | POA: Diagnosis not present

## 2022-11-30 DIAGNOSIS — L02212 Cutaneous abscess of back [any part, except buttock]: Secondary | ICD-10-CM | POA: Diagnosis not present

## 2022-11-30 DIAGNOSIS — L03312 Cellulitis of back [any part except buttock]: Secondary | ICD-10-CM | POA: Diagnosis not present

## 2022-11-30 DIAGNOSIS — Z7982 Long term (current) use of aspirin: Secondary | ICD-10-CM | POA: Diagnosis not present

## 2022-11-30 DIAGNOSIS — M5030 Other cervical disc degeneration, unspecified cervical region: Secondary | ICD-10-CM | POA: Diagnosis not present

## 2022-11-30 DIAGNOSIS — Z8673 Personal history of transient ischemic attack (TIA), and cerebral infarction without residual deficits: Secondary | ICD-10-CM | POA: Diagnosis not present

## 2022-11-30 DIAGNOSIS — M797 Fibromyalgia: Secondary | ICD-10-CM | POA: Diagnosis not present

## 2022-11-30 DIAGNOSIS — C9150 Adult T-cell lymphoma/leukemia (HTLV-1-associated) not having achieved remission: Secondary | ICD-10-CM | POA: Diagnosis not present

## 2022-11-30 DIAGNOSIS — L739 Follicular disorder, unspecified: Secondary | ICD-10-CM | POA: Diagnosis not present

## 2022-11-30 DIAGNOSIS — Z9181 History of falling: Secondary | ICD-10-CM | POA: Diagnosis not present

## 2022-11-30 DIAGNOSIS — R2689 Other abnormalities of gait and mobility: Secondary | ICD-10-CM | POA: Diagnosis not present

## 2022-11-30 DIAGNOSIS — F419 Anxiety disorder, unspecified: Secondary | ICD-10-CM | POA: Diagnosis not present

## 2022-12-03 DIAGNOSIS — Z8673 Personal history of transient ischemic attack (TIA), and cerebral infarction without residual deficits: Secondary | ICD-10-CM | POA: Diagnosis not present

## 2022-12-03 DIAGNOSIS — L03312 Cellulitis of back [any part except buttock]: Secondary | ICD-10-CM | POA: Diagnosis not present

## 2022-12-03 DIAGNOSIS — F419 Anxiety disorder, unspecified: Secondary | ICD-10-CM | POA: Diagnosis not present

## 2022-12-03 DIAGNOSIS — E079 Disorder of thyroid, unspecified: Secondary | ICD-10-CM | POA: Diagnosis not present

## 2022-12-03 DIAGNOSIS — M5030 Other cervical disc degeneration, unspecified cervical region: Secondary | ICD-10-CM | POA: Diagnosis not present

## 2022-12-03 DIAGNOSIS — J45909 Unspecified asthma, uncomplicated: Secondary | ICD-10-CM | POA: Diagnosis not present

## 2022-12-03 DIAGNOSIS — L02212 Cutaneous abscess of back [any part, except buttock]: Secondary | ICD-10-CM | POA: Diagnosis not present

## 2022-12-03 DIAGNOSIS — F331 Major depressive disorder, recurrent, moderate: Secondary | ICD-10-CM | POA: Diagnosis not present

## 2022-12-03 DIAGNOSIS — G43909 Migraine, unspecified, not intractable, without status migrainosus: Secondary | ICD-10-CM | POA: Diagnosis not present

## 2022-12-03 DIAGNOSIS — Z79899 Other long term (current) drug therapy: Secondary | ICD-10-CM | POA: Diagnosis not present

## 2022-12-03 DIAGNOSIS — K589 Irritable bowel syndrome without diarrhea: Secondary | ICD-10-CM | POA: Diagnosis not present

## 2022-12-03 DIAGNOSIS — Z7951 Long term (current) use of inhaled steroids: Secondary | ICD-10-CM | POA: Diagnosis not present

## 2022-12-03 DIAGNOSIS — M797 Fibromyalgia: Secondary | ICD-10-CM | POA: Diagnosis not present

## 2022-12-03 DIAGNOSIS — I10 Essential (primary) hypertension: Secondary | ICD-10-CM | POA: Diagnosis not present

## 2022-12-03 DIAGNOSIS — R2689 Other abnormalities of gait and mobility: Secondary | ICD-10-CM | POA: Diagnosis not present

## 2022-12-03 DIAGNOSIS — L739 Follicular disorder, unspecified: Secondary | ICD-10-CM | POA: Diagnosis not present

## 2022-12-03 DIAGNOSIS — G4700 Insomnia, unspecified: Secondary | ICD-10-CM | POA: Diagnosis not present

## 2022-12-03 DIAGNOSIS — C9150 Adult T-cell lymphoma/leukemia (HTLV-1-associated) not having achieved remission: Secondary | ICD-10-CM | POA: Diagnosis not present

## 2022-12-03 DIAGNOSIS — F17210 Nicotine dependence, cigarettes, uncomplicated: Secondary | ICD-10-CM | POA: Diagnosis not present

## 2022-12-03 DIAGNOSIS — Z7982 Long term (current) use of aspirin: Secondary | ICD-10-CM | POA: Diagnosis not present

## 2022-12-03 DIAGNOSIS — Z9181 History of falling: Secondary | ICD-10-CM | POA: Diagnosis not present

## 2022-12-03 DIAGNOSIS — E119 Type 2 diabetes mellitus without complications: Secondary | ICD-10-CM | POA: Diagnosis not present

## 2022-12-06 DIAGNOSIS — F331 Major depressive disorder, recurrent, moderate: Secondary | ICD-10-CM | POA: Diagnosis not present

## 2022-12-06 DIAGNOSIS — Z7951 Long term (current) use of inhaled steroids: Secondary | ICD-10-CM | POA: Diagnosis not present

## 2022-12-06 DIAGNOSIS — J45909 Unspecified asthma, uncomplicated: Secondary | ICD-10-CM | POA: Diagnosis not present

## 2022-12-06 DIAGNOSIS — E079 Disorder of thyroid, unspecified: Secondary | ICD-10-CM | POA: Diagnosis not present

## 2022-12-06 DIAGNOSIS — F17210 Nicotine dependence, cigarettes, uncomplicated: Secondary | ICD-10-CM | POA: Diagnosis not present

## 2022-12-06 DIAGNOSIS — C9150 Adult T-cell lymphoma/leukemia (HTLV-1-associated) not having achieved remission: Secondary | ICD-10-CM | POA: Diagnosis not present

## 2022-12-06 DIAGNOSIS — R2689 Other abnormalities of gait and mobility: Secondary | ICD-10-CM | POA: Diagnosis not present

## 2022-12-06 DIAGNOSIS — L739 Follicular disorder, unspecified: Secondary | ICD-10-CM | POA: Diagnosis not present

## 2022-12-06 DIAGNOSIS — G43909 Migraine, unspecified, not intractable, without status migrainosus: Secondary | ICD-10-CM | POA: Diagnosis not present

## 2022-12-06 DIAGNOSIS — Z8673 Personal history of transient ischemic attack (TIA), and cerebral infarction without residual deficits: Secondary | ICD-10-CM | POA: Diagnosis not present

## 2022-12-06 DIAGNOSIS — K589 Irritable bowel syndrome without diarrhea: Secondary | ICD-10-CM | POA: Diagnosis not present

## 2022-12-06 DIAGNOSIS — E119 Type 2 diabetes mellitus without complications: Secondary | ICD-10-CM | POA: Diagnosis not present

## 2022-12-06 DIAGNOSIS — I10 Essential (primary) hypertension: Secondary | ICD-10-CM | POA: Diagnosis not present

## 2022-12-06 DIAGNOSIS — L03312 Cellulitis of back [any part except buttock]: Secondary | ICD-10-CM | POA: Diagnosis not present

## 2022-12-06 DIAGNOSIS — F419 Anxiety disorder, unspecified: Secondary | ICD-10-CM | POA: Diagnosis not present

## 2022-12-06 DIAGNOSIS — Z9181 History of falling: Secondary | ICD-10-CM | POA: Diagnosis not present

## 2022-12-06 DIAGNOSIS — M5030 Other cervical disc degeneration, unspecified cervical region: Secondary | ICD-10-CM | POA: Diagnosis not present

## 2022-12-06 DIAGNOSIS — M797 Fibromyalgia: Secondary | ICD-10-CM | POA: Diagnosis not present

## 2022-12-06 DIAGNOSIS — Z7982 Long term (current) use of aspirin: Secondary | ICD-10-CM | POA: Diagnosis not present

## 2022-12-06 DIAGNOSIS — Z79899 Other long term (current) drug therapy: Secondary | ICD-10-CM | POA: Diagnosis not present

## 2022-12-06 DIAGNOSIS — L02212 Cutaneous abscess of back [any part, except buttock]: Secondary | ICD-10-CM | POA: Diagnosis not present

## 2022-12-06 DIAGNOSIS — G4700 Insomnia, unspecified: Secondary | ICD-10-CM | POA: Diagnosis not present

## 2022-12-10 DIAGNOSIS — M5030 Other cervical disc degeneration, unspecified cervical region: Secondary | ICD-10-CM | POA: Diagnosis not present

## 2022-12-10 DIAGNOSIS — F419 Anxiety disorder, unspecified: Secondary | ICD-10-CM | POA: Diagnosis not present

## 2022-12-10 DIAGNOSIS — L02212 Cutaneous abscess of back [any part, except buttock]: Secondary | ICD-10-CM | POA: Diagnosis not present

## 2022-12-10 DIAGNOSIS — M797 Fibromyalgia: Secondary | ICD-10-CM | POA: Diagnosis not present

## 2022-12-10 DIAGNOSIS — Z79899 Other long term (current) drug therapy: Secondary | ICD-10-CM | POA: Diagnosis not present

## 2022-12-10 DIAGNOSIS — I10 Essential (primary) hypertension: Secondary | ICD-10-CM | POA: Diagnosis not present

## 2022-12-10 DIAGNOSIS — C9150 Adult T-cell lymphoma/leukemia (HTLV-1-associated) not having achieved remission: Secondary | ICD-10-CM | POA: Diagnosis not present

## 2022-12-10 DIAGNOSIS — G4700 Insomnia, unspecified: Secondary | ICD-10-CM | POA: Diagnosis not present

## 2022-12-10 DIAGNOSIS — Z8673 Personal history of transient ischemic attack (TIA), and cerebral infarction without residual deficits: Secondary | ICD-10-CM | POA: Diagnosis not present

## 2022-12-10 DIAGNOSIS — K589 Irritable bowel syndrome without diarrhea: Secondary | ICD-10-CM | POA: Diagnosis not present

## 2022-12-10 DIAGNOSIS — Z7982 Long term (current) use of aspirin: Secondary | ICD-10-CM | POA: Diagnosis not present

## 2022-12-10 DIAGNOSIS — Z9181 History of falling: Secondary | ICD-10-CM | POA: Diagnosis not present

## 2022-12-10 DIAGNOSIS — E119 Type 2 diabetes mellitus without complications: Secondary | ICD-10-CM | POA: Diagnosis not present

## 2022-12-10 DIAGNOSIS — G43909 Migraine, unspecified, not intractable, without status migrainosus: Secondary | ICD-10-CM | POA: Diagnosis not present

## 2022-12-10 DIAGNOSIS — L739 Follicular disorder, unspecified: Secondary | ICD-10-CM | POA: Diagnosis not present

## 2022-12-10 DIAGNOSIS — F17210 Nicotine dependence, cigarettes, uncomplicated: Secondary | ICD-10-CM | POA: Diagnosis not present

## 2022-12-10 DIAGNOSIS — J45909 Unspecified asthma, uncomplicated: Secondary | ICD-10-CM | POA: Diagnosis not present

## 2022-12-10 DIAGNOSIS — F331 Major depressive disorder, recurrent, moderate: Secondary | ICD-10-CM | POA: Diagnosis not present

## 2022-12-10 DIAGNOSIS — L03312 Cellulitis of back [any part except buttock]: Secondary | ICD-10-CM | POA: Diagnosis not present

## 2022-12-10 DIAGNOSIS — E079 Disorder of thyroid, unspecified: Secondary | ICD-10-CM | POA: Diagnosis not present

## 2022-12-10 DIAGNOSIS — Z7951 Long term (current) use of inhaled steroids: Secondary | ICD-10-CM | POA: Diagnosis not present

## 2022-12-10 DIAGNOSIS — R2689 Other abnormalities of gait and mobility: Secondary | ICD-10-CM | POA: Diagnosis not present

## 2022-12-14 DIAGNOSIS — J45909 Unspecified asthma, uncomplicated: Secondary | ICD-10-CM | POA: Diagnosis not present

## 2022-12-14 DIAGNOSIS — Z7982 Long term (current) use of aspirin: Secondary | ICD-10-CM | POA: Diagnosis not present

## 2022-12-14 DIAGNOSIS — C9150 Adult T-cell lymphoma/leukemia (HTLV-1-associated) not having achieved remission: Secondary | ICD-10-CM | POA: Diagnosis not present

## 2022-12-14 DIAGNOSIS — Z9181 History of falling: Secondary | ICD-10-CM | POA: Diagnosis not present

## 2022-12-14 DIAGNOSIS — Z8673 Personal history of transient ischemic attack (TIA), and cerebral infarction without residual deficits: Secondary | ICD-10-CM | POA: Diagnosis not present

## 2022-12-14 DIAGNOSIS — F17210 Nicotine dependence, cigarettes, uncomplicated: Secondary | ICD-10-CM | POA: Diagnosis not present

## 2022-12-14 DIAGNOSIS — E079 Disorder of thyroid, unspecified: Secondary | ICD-10-CM | POA: Diagnosis not present

## 2022-12-14 DIAGNOSIS — Z79899 Other long term (current) drug therapy: Secondary | ICD-10-CM | POA: Diagnosis not present

## 2022-12-14 DIAGNOSIS — G4700 Insomnia, unspecified: Secondary | ICD-10-CM | POA: Diagnosis not present

## 2022-12-14 DIAGNOSIS — E119 Type 2 diabetes mellitus without complications: Secondary | ICD-10-CM | POA: Diagnosis not present

## 2022-12-14 DIAGNOSIS — M5030 Other cervical disc degeneration, unspecified cervical region: Secondary | ICD-10-CM | POA: Diagnosis not present

## 2022-12-14 DIAGNOSIS — K589 Irritable bowel syndrome without diarrhea: Secondary | ICD-10-CM | POA: Diagnosis not present

## 2022-12-14 DIAGNOSIS — L03312 Cellulitis of back [any part except buttock]: Secondary | ICD-10-CM | POA: Diagnosis not present

## 2022-12-14 DIAGNOSIS — M797 Fibromyalgia: Secondary | ICD-10-CM | POA: Diagnosis not present

## 2022-12-14 DIAGNOSIS — L739 Follicular disorder, unspecified: Secondary | ICD-10-CM | POA: Diagnosis not present

## 2022-12-14 DIAGNOSIS — R2689 Other abnormalities of gait and mobility: Secondary | ICD-10-CM | POA: Diagnosis not present

## 2022-12-14 DIAGNOSIS — F419 Anxiety disorder, unspecified: Secondary | ICD-10-CM | POA: Diagnosis not present

## 2022-12-14 DIAGNOSIS — Z7951 Long term (current) use of inhaled steroids: Secondary | ICD-10-CM | POA: Diagnosis not present

## 2022-12-14 DIAGNOSIS — L02212 Cutaneous abscess of back [any part, except buttock]: Secondary | ICD-10-CM | POA: Diagnosis not present

## 2022-12-14 DIAGNOSIS — G43909 Migraine, unspecified, not intractable, without status migrainosus: Secondary | ICD-10-CM | POA: Diagnosis not present

## 2022-12-14 DIAGNOSIS — I10 Essential (primary) hypertension: Secondary | ICD-10-CM | POA: Diagnosis not present

## 2022-12-14 DIAGNOSIS — F331 Major depressive disorder, recurrent, moderate: Secondary | ICD-10-CM | POA: Diagnosis not present

## 2022-12-17 DIAGNOSIS — B999 Unspecified infectious disease: Secondary | ICD-10-CM | POA: Diagnosis not present

## 2022-12-17 DIAGNOSIS — Z1159 Encounter for screening for other viral diseases: Secondary | ICD-10-CM | POA: Diagnosis not present

## 2022-12-17 DIAGNOSIS — I10 Essential (primary) hypertension: Secondary | ICD-10-CM | POA: Diagnosis not present

## 2022-12-17 DIAGNOSIS — E669 Obesity, unspecified: Secondary | ICD-10-CM | POA: Diagnosis not present

## 2022-12-17 DIAGNOSIS — B333 Retrovirus infections, not elsewhere classified: Secondary | ICD-10-CM | POA: Diagnosis not present

## 2022-12-17 DIAGNOSIS — Z114 Encounter for screening for human immunodeficiency virus [HIV]: Secondary | ICD-10-CM | POA: Diagnosis not present

## 2022-12-17 DIAGNOSIS — Z0000 Encounter for general adult medical examination without abnormal findings: Secondary | ICD-10-CM | POA: Diagnosis not present

## 2022-12-17 DIAGNOSIS — Z79899 Other long term (current) drug therapy: Secondary | ICD-10-CM | POA: Diagnosis not present

## 2022-12-17 DIAGNOSIS — E1169 Type 2 diabetes mellitus with other specified complication: Secondary | ICD-10-CM | POA: Diagnosis not present

## 2022-12-17 DIAGNOSIS — M797 Fibromyalgia: Secondary | ICD-10-CM | POA: Diagnosis not present

## 2022-12-17 DIAGNOSIS — I739 Peripheral vascular disease, unspecified: Secondary | ICD-10-CM | POA: Diagnosis not present

## 2022-12-19 ENCOUNTER — Other Ambulatory Visit: Payer: Self-pay | Admitting: Family Medicine

## 2022-12-19 DIAGNOSIS — R2689 Other abnormalities of gait and mobility: Secondary | ICD-10-CM | POA: Diagnosis not present

## 2022-12-19 DIAGNOSIS — G43909 Migraine, unspecified, not intractable, without status migrainosus: Secondary | ICD-10-CM | POA: Diagnosis not present

## 2022-12-19 DIAGNOSIS — F419 Anxiety disorder, unspecified: Secondary | ICD-10-CM | POA: Diagnosis not present

## 2022-12-19 DIAGNOSIS — F17210 Nicotine dependence, cigarettes, uncomplicated: Secondary | ICD-10-CM | POA: Diagnosis not present

## 2022-12-19 DIAGNOSIS — R109 Unspecified abdominal pain: Secondary | ICD-10-CM

## 2022-12-19 DIAGNOSIS — Z8673 Personal history of transient ischemic attack (TIA), and cerebral infarction without residual deficits: Secondary | ICD-10-CM | POA: Diagnosis not present

## 2022-12-19 DIAGNOSIS — Z7951 Long term (current) use of inhaled steroids: Secondary | ICD-10-CM | POA: Diagnosis not present

## 2022-12-19 DIAGNOSIS — E119 Type 2 diabetes mellitus without complications: Secondary | ICD-10-CM | POA: Diagnosis not present

## 2022-12-19 DIAGNOSIS — Z7982 Long term (current) use of aspirin: Secondary | ICD-10-CM | POA: Diagnosis not present

## 2022-12-19 DIAGNOSIS — M5030 Other cervical disc degeneration, unspecified cervical region: Secondary | ICD-10-CM | POA: Diagnosis not present

## 2022-12-19 DIAGNOSIS — I10 Essential (primary) hypertension: Secondary | ICD-10-CM | POA: Diagnosis not present

## 2022-12-19 DIAGNOSIS — L739 Follicular disorder, unspecified: Secondary | ICD-10-CM | POA: Diagnosis not present

## 2022-12-19 DIAGNOSIS — G4700 Insomnia, unspecified: Secondary | ICD-10-CM | POA: Diagnosis not present

## 2022-12-19 DIAGNOSIS — M797 Fibromyalgia: Secondary | ICD-10-CM | POA: Diagnosis not present

## 2022-12-19 DIAGNOSIS — L03312 Cellulitis of back [any part except buttock]: Secondary | ICD-10-CM | POA: Diagnosis not present

## 2022-12-19 DIAGNOSIS — C9150 Adult T-cell lymphoma/leukemia (HTLV-1-associated) not having achieved remission: Secondary | ICD-10-CM | POA: Diagnosis not present

## 2022-12-19 DIAGNOSIS — E079 Disorder of thyroid, unspecified: Secondary | ICD-10-CM | POA: Diagnosis not present

## 2022-12-19 DIAGNOSIS — Z79899 Other long term (current) drug therapy: Secondary | ICD-10-CM | POA: Diagnosis not present

## 2022-12-19 DIAGNOSIS — N390 Urinary tract infection, site not specified: Secondary | ICD-10-CM

## 2022-12-19 DIAGNOSIS — K589 Irritable bowel syndrome without diarrhea: Secondary | ICD-10-CM | POA: Diagnosis not present

## 2022-12-19 DIAGNOSIS — L02212 Cutaneous abscess of back [any part, except buttock]: Secondary | ICD-10-CM | POA: Diagnosis not present

## 2022-12-19 DIAGNOSIS — J45909 Unspecified asthma, uncomplicated: Secondary | ICD-10-CM | POA: Diagnosis not present

## 2022-12-19 DIAGNOSIS — Z9181 History of falling: Secondary | ICD-10-CM | POA: Diagnosis not present

## 2022-12-19 DIAGNOSIS — F331 Major depressive disorder, recurrent, moderate: Secondary | ICD-10-CM | POA: Diagnosis not present

## 2022-12-20 ENCOUNTER — Other Ambulatory Visit: Payer: Self-pay | Admitting: Family Medicine

## 2022-12-20 DIAGNOSIS — N390 Urinary tract infection, site not specified: Secondary | ICD-10-CM

## 2022-12-20 DIAGNOSIS — R109 Unspecified abdominal pain: Secondary | ICD-10-CM

## 2022-12-21 DIAGNOSIS — F419 Anxiety disorder, unspecified: Secondary | ICD-10-CM | POA: Diagnosis not present

## 2022-12-21 DIAGNOSIS — G43909 Migraine, unspecified, not intractable, without status migrainosus: Secondary | ICD-10-CM | POA: Diagnosis not present

## 2022-12-21 DIAGNOSIS — I10 Essential (primary) hypertension: Secondary | ICD-10-CM | POA: Diagnosis not present

## 2022-12-21 DIAGNOSIS — J45909 Unspecified asthma, uncomplicated: Secondary | ICD-10-CM | POA: Diagnosis not present

## 2022-12-21 DIAGNOSIS — L739 Follicular disorder, unspecified: Secondary | ICD-10-CM | POA: Diagnosis not present

## 2022-12-21 DIAGNOSIS — L03312 Cellulitis of back [any part except buttock]: Secondary | ICD-10-CM | POA: Diagnosis not present

## 2022-12-21 DIAGNOSIS — K589 Irritable bowel syndrome without diarrhea: Secondary | ICD-10-CM | POA: Diagnosis not present

## 2022-12-21 DIAGNOSIS — Z7982 Long term (current) use of aspirin: Secondary | ICD-10-CM | POA: Diagnosis not present

## 2022-12-21 DIAGNOSIS — Z8673 Personal history of transient ischemic attack (TIA), and cerebral infarction without residual deficits: Secondary | ICD-10-CM | POA: Diagnosis not present

## 2022-12-21 DIAGNOSIS — Z79899 Other long term (current) drug therapy: Secondary | ICD-10-CM | POA: Diagnosis not present

## 2022-12-21 DIAGNOSIS — E119 Type 2 diabetes mellitus without complications: Secondary | ICD-10-CM | POA: Diagnosis not present

## 2022-12-21 DIAGNOSIS — C9150 Adult T-cell lymphoma/leukemia (HTLV-1-associated) not having achieved remission: Secondary | ICD-10-CM | POA: Diagnosis not present

## 2022-12-21 DIAGNOSIS — F331 Major depressive disorder, recurrent, moderate: Secondary | ICD-10-CM | POA: Diagnosis not present

## 2022-12-21 DIAGNOSIS — G4700 Insomnia, unspecified: Secondary | ICD-10-CM | POA: Diagnosis not present

## 2022-12-21 DIAGNOSIS — Z9181 History of falling: Secondary | ICD-10-CM | POA: Diagnosis not present

## 2022-12-21 DIAGNOSIS — M5030 Other cervical disc degeneration, unspecified cervical region: Secondary | ICD-10-CM | POA: Diagnosis not present

## 2022-12-21 DIAGNOSIS — Z7951 Long term (current) use of inhaled steroids: Secondary | ICD-10-CM | POA: Diagnosis not present

## 2022-12-21 DIAGNOSIS — E079 Disorder of thyroid, unspecified: Secondary | ICD-10-CM | POA: Diagnosis not present

## 2022-12-21 DIAGNOSIS — R2689 Other abnormalities of gait and mobility: Secondary | ICD-10-CM | POA: Diagnosis not present

## 2022-12-21 DIAGNOSIS — M797 Fibromyalgia: Secondary | ICD-10-CM | POA: Diagnosis not present

## 2022-12-21 DIAGNOSIS — L02212 Cutaneous abscess of back [any part, except buttock]: Secondary | ICD-10-CM | POA: Diagnosis not present

## 2022-12-21 DIAGNOSIS — F17210 Nicotine dependence, cigarettes, uncomplicated: Secondary | ICD-10-CM | POA: Diagnosis not present

## 2022-12-25 DIAGNOSIS — F341 Dysthymic disorder: Secondary | ICD-10-CM | POA: Diagnosis not present

## 2023-01-07 ENCOUNTER — Encounter: Payer: Self-pay | Admitting: Family Medicine

## 2023-01-10 ENCOUNTER — Other Ambulatory Visit: Payer: Medicare HMO

## 2023-01-22 ENCOUNTER — Emergency Department (HOSPITAL_COMMUNITY): Payer: Medicare HMO

## 2023-01-22 ENCOUNTER — Emergency Department (HOSPITAL_COMMUNITY)
Admission: EM | Admit: 2023-01-22 | Discharge: 2023-01-22 | Disposition: A | Discharge status: Home / Self Care | Payer: Medicare HMO | Attending: Emergency Medicine | Admitting: Emergency Medicine

## 2023-01-22 ENCOUNTER — Encounter (HOSPITAL_COMMUNITY): Payer: Self-pay

## 2023-01-22 ENCOUNTER — Other Ambulatory Visit: Payer: Self-pay

## 2023-01-22 DIAGNOSIS — J45909 Unspecified asthma, uncomplicated: Secondary | ICD-10-CM | POA: Insufficient documentation

## 2023-01-22 DIAGNOSIS — I509 Heart failure, unspecified: Secondary | ICD-10-CM | POA: Diagnosis not present

## 2023-01-22 DIAGNOSIS — E079 Disorder of thyroid, unspecified: Secondary | ICD-10-CM | POA: Diagnosis not present

## 2023-01-22 DIAGNOSIS — Z79899 Other long term (current) drug therapy: Secondary | ICD-10-CM | POA: Diagnosis not present

## 2023-01-22 DIAGNOSIS — I10 Essential (primary) hypertension: Secondary | ICD-10-CM | POA: Diagnosis not present

## 2023-01-22 DIAGNOSIS — R0989 Other specified symptoms and signs involving the circulatory and respiratory systems: Secondary | ICD-10-CM | POA: Diagnosis not present

## 2023-01-22 DIAGNOSIS — Z21 Asymptomatic human immunodeficiency virus [HIV] infection status: Secondary | ICD-10-CM | POA: Insufficient documentation

## 2023-01-22 DIAGNOSIS — R0602 Shortness of breath: Secondary | ICD-10-CM | POA: Diagnosis not present

## 2023-01-22 DIAGNOSIS — Z20822 Contact with and (suspected) exposure to covid-19: Secondary | ICD-10-CM | POA: Insufficient documentation

## 2023-01-22 DIAGNOSIS — R9431 Abnormal electrocardiogram [ECG] [EKG]: Secondary | ICD-10-CM | POA: Insufficient documentation

## 2023-01-22 DIAGNOSIS — J189 Pneumonia, unspecified organism: Secondary | ICD-10-CM | POA: Insufficient documentation

## 2023-01-22 DIAGNOSIS — Z7951 Long term (current) use of inhaled steroids: Secondary | ICD-10-CM | POA: Insufficient documentation

## 2023-01-22 DIAGNOSIS — J811 Chronic pulmonary edema: Secondary | ICD-10-CM | POA: Diagnosis not present

## 2023-01-22 LAB — CBC
HCT: 42.1 % (ref 36.0–46.0)
Hemoglobin: 13.4 g/dL (ref 12.0–15.0)
MCH: 27 pg (ref 26.0–34.0)
MCHC: 31.8 g/dL (ref 30.0–36.0)
MCV: 84.7 fL (ref 80.0–100.0)
Platelets: 349 10*3/uL (ref 150–400)
RBC: 4.97 MIL/uL (ref 3.87–5.11)
RDW: 13.9 % (ref 11.5–15.5)
WBC: 11.8 10*3/uL — ABNORMAL HIGH (ref 4.0–10.5)
nRBC: 0 % (ref 0.0–0.2)

## 2023-01-22 LAB — RESP PANEL BY RT-PCR (RSV, FLU A&B, COVID)  RVPGX2
Influenza A by PCR: NEGATIVE
Influenza B by PCR: NEGATIVE
Resp Syncytial Virus by PCR: NEGATIVE
SARS Coronavirus 2 by RT PCR: NEGATIVE

## 2023-01-22 LAB — COMPREHENSIVE METABOLIC PANEL
ALT: 20 U/L (ref 0–44)
AST: 14 U/L — ABNORMAL LOW (ref 15–41)
Albumin: 3.6 g/dL (ref 3.5–5.0)
Alkaline Phosphatase: 68 U/L (ref 38–126)
Anion gap: 11 (ref 5–15)
BUN: 6 mg/dL (ref 6–20)
CO2: 23 mmol/L (ref 22–32)
Calcium: 8.7 mg/dL — ABNORMAL LOW (ref 8.9–10.3)
Chloride: 101 mmol/L (ref 98–111)
Creatinine, Ser: 0.92 mg/dL (ref 0.44–1.00)
GFR, Estimated: >60 mL/min (ref >60)
Glucose, Bld: 118 mg/dL — ABNORMAL HIGH (ref 70–99)
Potassium: 3.1 mmol/L — ABNORMAL LOW (ref 3.5–5.1)
Sodium: 135 mmol/L (ref 135–145)
Total Bilirubin: 0.8 mg/dL (ref 0.3–1.2)
Total Protein: 6.9 g/dL (ref 6.5–8.1)

## 2023-01-22 LAB — TROPONIN I (HIGH SENSITIVITY)
Troponin I (High Sensitivity): 2 ng/L (ref <18)
Troponin I (High Sensitivity): 3 ng/L (ref <18)

## 2023-01-22 LAB — GROUP A STREP BY PCR: Group A Strep by PCR: NOT DETECTED

## 2023-01-22 LAB — TSH: TSH: 2.347 u[IU]/mL (ref 0.350–4.500)

## 2023-01-22 LAB — BRAIN NATRIURETIC PEPTIDE: B Natriuretic Peptide: 7.7 pg/mL (ref 0.0–100.0)

## 2023-01-22 LAB — HCG, QUANTITATIVE, PREGNANCY: hCG, Beta Chain, Quant, S: 2 m[IU]/mL (ref <5)

## 2023-01-22 MED ORDER — IPRATROPIUM-ALBUTEROL 0.5-2.5 (3) MG/3ML IN SOLN
3.0000 mL | Freq: Once | RESPIRATORY_TRACT | Status: AC
  Administered 2023-01-22: 3 mL via RESPIRATORY_TRACT
  Filled 2023-01-22: qty 3

## 2023-01-22 MED ORDER — AMOXICILLIN 500 MG PO CAPS: 500.0000 mg | ORAL_CAPSULE | Freq: Three times a day (TID) | ORAL | 0 refills | Status: DC

## 2023-01-22 MED ORDER — AMOXICILLIN 500 MG PO CAPS
500.0000 mg | ORAL_CAPSULE | Freq: Once | ORAL | Status: AC
  Administered 2023-01-22: 500 mg via ORAL
  Filled 2023-01-22: qty 1

## 2023-01-22 MED ORDER — DOXYCYCLINE HYCLATE 100 MG PO TABS
100.0000 mg | ORAL_TABLET | Freq: Once | ORAL | Status: AC
  Administered 2023-01-22: 100 mg via ORAL
  Filled 2023-01-22: qty 1

## 2023-01-22 MED ORDER — ALBUTEROL SULFATE HFA 108 (90 BASE) MCG/ACT IN AERS
2.0000 | INHALATION_SPRAY | RESPIRATORY_TRACT | Status: DC | PRN
  Filled 2023-01-22: qty 6.7

## 2023-01-22 MED ORDER — DOXYCYCLINE HYCLATE 100 MG PO CAPS: 100.0000 mg | ORAL_CAPSULE | Freq: Two times a day (BID) | ORAL | 0 refills | Status: DC

## 2023-01-22 MED ORDER — POTASSIUM CHLORIDE CRYS ER 20 MEQ PO TBCR
40.0000 meq | EXTENDED_RELEASE_TABLET | Freq: Once | ORAL | Status: AC
  Administered 2023-01-22: 40 meq via ORAL
  Filled 2023-01-22: qty 2

## 2023-01-22 NOTE — ED Provider Triage Note (Addendum)
Emergency Medicine Provider Triage Evaluation Note  Brianna Roberts , a 57 y.o. female  was evaluated in triage.  Pt complains of cough, SOB, rib pain.  Review of Systems  Positive:  Negative:   Physical Exam  BP 128/70   Pulse 88   Temp 99.1 F (37.3 C) (Oral)   Resp (!) 22   SpO2 100%  Gen:   Awake, no distress   Resp:  Normal effort  MSK:   Moves extremities without difficulty  Other:    Medical Decision Making  Medically screening exam initiated at 5:46 PM.  Appropriate orders placed.  Brianna Roberts was informed that the remainder of the evaluation will be completed by another provider, this initial triage assessment does not replace that evaluation, and the importance of remaining in the ED until their evaluation is complete.  Hx asthma. Concerned for cough x1 week. Illness started with rhinorrhea and sore throat a little over 1 week ago. Patient also with SOB x4days that progressed in severity today. Patient now with sternal/rib pain since 1PM today. Patient also with non-bloody vomiting and dizziness today.  Of note, patient also with back abscess that she was admitted for in May.  SOB order set. Also added on troponin given that patient's chest pain happened along with vomiting and dizziness today.    Dorthy Cooler, [place] 01/22/23 1751    Dorthy Cooler, [place] 01/22/23 1753

## 2023-01-22 NOTE — ED Triage Notes (Signed)
Pt reports chest pain, sob and cough for the past week.

## 2023-01-22 NOTE — Discharge Instructions (Signed)
You have been prescribed 2 different antibiotics to cover for potential pneumonia/UTI/skin infection.  Take antibiotic as prescribed for the full duration.  Eat yogurt high in probiotic while on antibiotic to decrease risk of diarrhea associated with antibiotic use.  Use albuterol inhaler 2 puffs every 4 hours as needed for shortness of breath.  Follow-up closely with your doctor for further care.  Return if you have any concern.

## 2023-01-22 NOTE — ED Provider Notes (Signed)
[place] EMERGENCY DEPARTMENT AT [facility] Provider Note   CSN: [id number] Arrival date & time: 01/22/23  1705     History  Chief Complaint  Patient presents with   Shortness of Breath    Brianna Roberts is a 57 y.o. female.  The history is provided by the patient and medical records. No language interpreter was used.  Shortness of Breath    Brianna Roberts is a 57 yo female with a past medical history of HIV, asthma, HTN, fibromyalgia, and thyroid disease presenting today with worsening shortness of breath. She has felt unwell for the past 2 weeks. She says symptoms started as congestion and sinus pressure and have since moved to her throat and into her chest. She has been having a productive cough that she says is so thick she can't cough the phlegm up, shortness of breath, pleuritic "rib pain", and chest pressure that she also feels in her back. She couldn't find her nebulizer and attempted to use her rescue inhaler this morning around 0430 but says it ran out. Before today she had used her rescue inhaler on Saturday, but has not used it since. She has also been having boils and rashes intermittently appear over the past several weeks. She says they burn and are itchy. She has had a history of surgical drainage of boils in the past with the most recent operation being in May. She has also been experiencing some dysuria. She says she was treated for a UTI last week with Macrobid but symptoms never completely resolved. She denies any known fevers. She has had some nausea and vomiting today.   Home Medications Prior to Admission medications   Medication Sig Start Date End Date Taking? Authorizing Provider  acetaminophen (TYLENOL) 325 MG tablet Take 650 mg by mouth every 6 (six) hours as needed (for pain).    [provider]  acyclovir (ZOVIRAX) 200 MG capsule Take 1 capsule (200 mg total) by mouth 2 (two) times daily. For Outbreak: 2 capsules (400 mg) TID x 5 days. Patient  taking differently: Take 400 mg by mouth 2 (two) times daily. 11/01/17   Porfirio Oar, PA  albuterol (PROVENTIL HFA;VENTOLIN HFA) 108 (90 Base) MCG/ACT inhaler Inhale 1-2 puffs into the lungs every 6 (six) hours as needed for wheezing or shortness of breath. 08/09/17   Porfirio Oar, PA  amitriptyline (ELAVIL) 100 MG tablet Take 100 mg by mouth at bedtime. 04/17/21   [provider]  amLODipine (NORVASC) 10 MG tablet Take 1 tablet (10 mg total) by mouth daily. 08/21/17   Porfirio Oar, PA  amoxicillin (AMOXIL) 500 MG capsule Take 1 capsule (500 mg total) by mouth 3 (three) times daily. 07/17/22   Elson Areas, PA-C  amoxicillin-clavulanate (AUGMENTIN) 875-125 MG tablet Take 1 tablet by mouth 2 (two) times daily. 04/09/21   [provider]  ARIPiprazole (ABILIFY) 10 MG tablet Take 1 tablet (10 mg total) by mouth daily. 10/05/16   Porfirio Oar, PA  ASPERCREME LIDOCAINE EX Apply 1 application topically 2 (two) times daily as needed (for muscle pain).    [provider]  azelastine (ASTELIN) 0.1 % nasal spray USE 2 SPRAYS EACH NOSTRIL TWICE DAILY. Patient taking differently: Place 2 sprays into both nostrils 2 (two) times daily. 10/15/17   Porfirio Oar, PA  budesonide-formoterol (SYMBICORT) 160-4.5 MCG/ACT inhaler Inhale 2 puffs into the lungs 2 (two) times daily. Patient not taking: Reported on 04/22/2021 12/27/16   Porfirio Oar, PA  clobetasol ointment (TEMOVATE) 0.05 %  Apply 1 application topically as needed (for [place]). 09/12/18   [provider]  dexamethasone (DECADRON) 2 MG tablet Take 5 tablets (10 mg total) by mouth once as needed for up to 1 dose. 04/22/21   Tilden Fossa, MD  diazepam (VALIUM) 5 MG tablet Take 1 tablet (5 mg total) by mouth every 8 (eight) hours as needed for anxiety. Patient taking differently: Take 5 mg by mouth 2 (two) times daily. 10/14/17   Porfirio Oar, PA  doxycycline (VIBRAMYCIN) 100 MG capsule Take 1 capsule (100  mg total) by mouth 2 (two) times daily. 04/22/21   Tilden Fossa, MD  fluticasone (FLONASE) 50 MCG/ACT nasal spray USE 1 SPRAY IN EACH NOSTRIL DAILY. 04/02/17   Porfirio Oar, PA  furosemide (LASIX) 40 MG tablet Take 1 tablet (40 mg total) by mouth 2 (two) times daily. Patient taking differently: Take 80 mg by mouth at bedtime. 08/09/17   Porfirio Oar, PA  glucose blood test strip Use as instructed 04/17/17   Porfirio Oar, PA  hydrOXYzine (ATARAX/VISTARIL) 10 MG tablet Take 1 tablet (10 mg total) by mouth at bedtime. Patient taking differently: Take 10 mg by mouth 2 (two) times daily. 10/05/16   Porfirio Oar, PA  Incontinence Supply Disposable (PREVAIL WASHCLOTHS) MISC Use daily as directed 06/03/17   Porfirio Oar, PA  ipratropium-albuterol (DUONEB) 0.5-2.5 (3) MG/3ML SOLN Take 3 mLs by nebulization every 6 (six) hours as needed (for breathing). Patient not taking: Reported on 04/22/2021 10/05/16   Porfirio Oar, PA  Lancets Thin MISC Use to check home glucose daily 03/28/17   Porfirio Oar, PA  levothyroxine (SYNTHROID, LEVOTHROID) 50 MCG tablet Take 1 tablet (50 mcg total) by mouth daily before breakfast. Patient taking differently: Take 50 mcg by mouth daily. 08/17/17   Porfirio Oar, PA  losartan (COZAAR) 100 MG tablet Take 1 tablet (100 mg total) by mouth daily. Patient not taking: Reported on 04/22/2021 08/09/17   Porfirio Oar, PA  meloxicam (MOBIC) 15 MG tablet TAKE 1 TABLET EACH DAY. Patient taking differently: Take 15 mg by mouth 2 (two) times daily as needed for pain. 11/08/17   Porfirio Oar, PA  metoprolol tartrate (LOPRESSOR) 100 MG tablet Take 1 tablet (100 mg total) by mouth 2 (two) times daily. 08/09/17   Porfirio Oar, PA  Nerve Stimulator (PRO COMFORT TENS UNIT) DEVI 1 Device by Does not apply route daily as needed. 12/27/16   Porfirio Oar, PA  nystatin cream (MYCOSTATIN) Apply 1 application topically 2 (two) times daily.    [provider]   oxyCODONE (OXY IR/ROXICODONE) 5 MG immediate release tablet Take 1 tablet (5 mg total) by mouth every 4 (four) hours as needed for severe pain. 10/15/17   Porfirio Oar, PA  potassium chloride SA (K-DUR,KLOR-CON) 20 MEQ tablet Take 1 tablet (20 mEq total) by mouth 2 (two) times daily. 06/24/17   Porfirio Oar, PA  triamcinolone ointment (KENALOG) 0.5 % Apply 1 application topically daily.    [provider]  vitamin B-12 (CYANOCOBALAMIN) 100 MCG tablet Take 100 mcg by mouth once a week.    [provider]  Vitamin D, Ergocalciferol, (DRISDOL) 1.25 MG (50000 UNIT) CAPS capsule Take 50,000 Units by mouth once a week. On Saturday 03/07/21   [provider]      Allergies    Hydromorphone, Iodinated contrast media, Sulfa antibiotics, Codeine, Nickel, Penicillins, Spironolactone, Cefdinir, and Adhesive [tape]    Review of Systems   Review of Systems  Respiratory:  Positive for shortness of breath.  All other systems reviewed and are negative.   Physical Exam Updated Vital Signs BP 128/70   Pulse 88   Temp 99.1 F (37.3 C) (Oral)   Resp (!) 22   SpO2 100%  Physical Exam Vitals and nursing note reviewed.  Constitutional:      General: She is not in acute distress.    Appearance: She is well-developed. She is obese.  HENT:     Head: Atraumatic.     Mouth/Throat:     Mouth: Mucous membranes are moist.     Pharynx: Oropharynx is clear. No pharyngeal swelling or oropharyngeal exudate.  Eyes:     Conjunctiva/sclera: Conjunctivae normal.  Cardiovascular:     Rate and Rhythm: Normal rate and regular rhythm.  Pulmonary:     Effort: Pulmonary effort is normal.     Breath sounds: Wheezing present. No rales.  Chest:     Chest wall: No tenderness.  Musculoskeletal:     Cervical back: Normal range of motion and neck supple.  Lymphadenopathy:     Cervical: Cervical adenopathy present.  Skin:    Findings: No rash.  Neurological:     Mental Status: She is  alert.  Psychiatric:        Mood and Affect: Mood normal.     ED Results / Procedures / Treatments   Labs (all labs ordered are listed, but only abnormal results are displayed) Labs Reviewed  COMPREHENSIVE METABOLIC PANEL - Abnormal; Notable for the following components:      Result Value   Potassium 3.1 (*)    Glucose, Bld 118 (*)    Calcium 8.7 (*)    AST 14 (*)    All other components within normal limits  CBC - Abnormal; Notable for the following components:   WBC 11.8 (*)    All other components within normal limits  RESP PANEL BY RT-PCR (RSV, FLU A&B, COVID)  [id number]  GROUP A STREP BY PCR  HCG, QUANTITATIVE, PREGNANCY  BRAIN NATRIURETIC PEPTIDE  TSH  URINALYSIS, ROUTINE W REFLEX MICROSCOPIC  TROPONIN I (HIGH SENSITIVITY)  TROPONIN I (HIGH SENSITIVITY)    EKG None ED ECG REPORT   Date: 01/22/2023  Rate: 95  Rhythm: normal sinus rhythm  QRS Axis: normal  Intervals: normal  ST/T Wave abnormalities: normal  Conduction Disutrbances:none  Narrative Interpretation:   Old EKG Reviewed: unchanged  I have personally reviewed the EKG tracing and agree with the computerized printout as noted.   Radiology DG Chest Port 1 View  Result Date: 01/22/2023 CLINICAL DATA:  Cough and shortness of breath EXAM: PORTABLE CHEST 1 VIEW COMPARISON:  07/17/2022 FINDINGS: Heart is enlarged with diffuse bilateral vascular and increased interstitial opacities favored to be edema related to CHF. Difficult to exclude superimposed pneumonia. No large effusion or pneumothorax. Trachea midline. Aorta atherosclerotic. IMPRESSION: Cardiomegaly with vascular congestion and interstitial edema pattern. Electronically Signed   By: Judie Petit.  Shick M.D.   On: 01/22/2023 19:02    Procedures Procedures    Medications Ordered in ED Medications  albuterol (VENTOLIN HFA) 108 (90 Base) MCG/ACT inhaler 2 puff (has no administration in time range)  ipratropium-albuterol (DUONEB) 0.5-2.5 (3) MG/3ML nebulizer  solution 3 mL (3 mLs Nebulization Given 01/22/23 1757)  potassium chloride SA (KLOR-CON M) CR tablet 40 mEq (40 mEq Oral Given 01/22/23 2127)  ipratropium-albuterol (DUONEB) 0.5-2.5 (3) MG/3ML nebulizer solution 3 mL (3 mLs Nebulization Given 01/22/23 2131)    ED Course/ Medical Decision Making/ A&P  Medical Decision Making Amount and/or Complexity of Data Reviewed Labs: ordered.   BP 124/81   Pulse 94   Temp 99.1 F (37.3 C) (Oral)   Resp 17   SpO2 95%   9:28 PM Brianna Roberts is a 57 yo female with a past medical history of HIV, asthma, HTN, fibromyalgia, and thyroid disease presenting today with worsening shortness of breath. She has felt unwell for the past 2 weeks. She says symptoms started as congestion and sinus pressure and have since moved to her throat and into her chest. She has been having a productive cough that she says is so thick she can't cough the phlegm up, shortness of breath, pleuritic "rib pain", and chest pressure that she also feels in her back. She couldn't find her nebulizer and attempted to use her rescue inhaler this morning around 0430 but says it ran out. Before today she had used her rescue inhaler on Saturday, but has not used it since. She has also been having boils and rashes intermittently appear over the past several weeks. She says they burn and are itchy. She has had a history of surgical drainage of boils in the past with the most recent operation being in May. She has also been experiencing some dysuria. She says she was treated for a UTI last week with Macrobid but symptoms never completely resolved. She denies any known fevers. She has had some nausea and vomiting today.   On exam this is an obese female appears slightly uncomfortable but nontoxic.  Throat exam with midline uvula no tonsillar enlargement or exudates.  She does have some mild tenderness to the right side of her jawline with some lymphadenopathy but no no concerning  skin changes and her trachea is midline.  Heart with normal rate in the room, lungs with decreased breath sounds and occasional wheezes heard.  Abdomen is soft nontender she does have some tenderness to her mid Thoracic spine without any overlying skin changes but her skin does feel warm to the touch.  Her vital signs overall reassuring.  -Labs ordered, independently viewed and interpreted by me.  Labs remarkable for negative strep test, negative viral respiratory panel, normal BMP, normal TSH, potassium is 3.1 will provide supplementation, white count mildly elevated at 11.8 and she has negative delta troponin. -The patient was maintained on a cardiac monitor.  I personally viewed and interpreted the cardiac monitored which showed an underlying rhythm of: Normal sinus rhythm -Imaging independently viewed and interpreted by me and I agree with radiologist's interpretation.  Result remarkable for chest x-ray shows either pulmonary edema versus increased interstitial opacities, difficult to exclude superimposed pneumonia. -This patient presents to the ED for concern of multiple complaints, this involves an extensive number of treatment options, and is a complaint that carries with it a high risk of complications and morbidity.  The differential diagnosis includes pneumonia, strep, viral illness, cellulitis, abscess, UTI, PE -Co morbidities that complicate the patient evaluation includes anxiety, HTLV, fibromyalgia -Treatment includes duonebs, albuterol, potassium -Reevaluation of the patient after these medicines showed that the patient improved -PCP office notes or outside notes reviewed -Escalation to admission/observation considered: patients feels much better, is comfortable with discharge, and will follow up with PCP -Prescription medication considered, patient comfortable with amox and doxy -Social Determinant of Health considered which includes tobacco use, financial difficulty, lack of  transportation, lack of housing, depression  Given her multitude of complaints, I felt would be appropriate to start patient on antibiotic to cover for potential  UTI, pneumonia, and possible skin infection.  Will prescribe amoxicillin and doxycycline.  Patient also provided with albuterol inhaler to use as needed for her shortness of breath.  This time patient is stable to be discharged home.  She does not meet inpatient criteria for admission.  Please note, patient has HTLV instead of HIV.         Final Clinical Impression(s) / ED Diagnoses Final diagnoses:  Community acquired pneumonia, unspecified laterality  Shortness of breath    Rx / DC Orders ED Discharge Orders          Ordered    amoxicillin (AMOXIL) 500 MG capsule  3 times daily        01/22/23 2217    doxycycline (VIBRAMYCIN) 100 MG capsule  2 times daily        01/22/23 2217              Fayrene Helper, PA-C 01/22/23 2219    Virgina Norfolk, DO 01/22/23 2325

## 2023-01-23 ENCOUNTER — Other Ambulatory Visit: Payer: Self-pay

## 2023-01-23 ENCOUNTER — Emergency Department (HOSPITAL_COMMUNITY): Payer: Medicare HMO

## 2023-01-23 ENCOUNTER — Observation Stay (HOSPITAL_COMMUNITY)
Admission: EM | Admit: 2023-01-23 | Discharge: 2023-01-24 | Disposition: A | Discharge status: Home Health | Payer: Medicare HMO | Attending: Internal Medicine | Admitting: Internal Medicine

## 2023-01-23 DIAGNOSIS — J441 Chronic obstructive pulmonary disease with (acute) exacerbation: Secondary | ICD-10-CM | POA: Diagnosis not present

## 2023-01-23 DIAGNOSIS — R000 Tachycardia, unspecified: Secondary | ICD-10-CM | POA: Diagnosis not present

## 2023-01-23 DIAGNOSIS — M797 Fibromyalgia: Secondary | ICD-10-CM | POA: Insufficient documentation

## 2023-01-23 DIAGNOSIS — I5030 Unspecified diastolic (congestive) heart failure: Secondary | ICD-10-CM | POA: Insufficient documentation

## 2023-01-23 DIAGNOSIS — J9811 Atelectasis: Secondary | ICD-10-CM | POA: Diagnosis not present

## 2023-01-23 DIAGNOSIS — R112 Nausea with vomiting, unspecified: Secondary | ICD-10-CM | POA: Insufficient documentation

## 2023-01-23 DIAGNOSIS — J45909 Unspecified asthma, uncomplicated: Secondary | ICD-10-CM

## 2023-01-23 DIAGNOSIS — R0602 Shortness of breath: Secondary | ICD-10-CM | POA: Diagnosis not present

## 2023-01-23 DIAGNOSIS — R7402 Elevation of levels of lactic acid dehydrogenase (LDH): Secondary | ICD-10-CM | POA: Diagnosis not present

## 2023-01-23 DIAGNOSIS — J189 Pneumonia, unspecified organism: Secondary | ICD-10-CM | POA: Diagnosis not present

## 2023-01-23 DIAGNOSIS — G4733 Obstructive sleep apnea (adult) (pediatric): Secondary | ICD-10-CM | POA: Insufficient documentation

## 2023-01-23 DIAGNOSIS — R21 Rash and other nonspecific skin eruption: Secondary | ICD-10-CM | POA: Diagnosis not present

## 2023-01-23 DIAGNOSIS — Z79899 Other long term (current) drug therapy: Secondary | ICD-10-CM | POA: Insufficient documentation

## 2023-01-23 DIAGNOSIS — E876 Hypokalemia: Secondary | ICD-10-CM | POA: Diagnosis not present

## 2023-01-23 DIAGNOSIS — R062 Wheezing: Secondary | ICD-10-CM | POA: Diagnosis not present

## 2023-01-23 LAB — URINALYSIS, ROUTINE W REFLEX MICROSCOPIC
Bilirubin Urine: NEGATIVE
Glucose, UA: NEGATIVE mg/dL
Hgb urine dipstick: NEGATIVE
Ketones, ur: NEGATIVE mg/dL
Leukocytes,Ua: NEGATIVE
Nitrite: NEGATIVE
Protein, ur: 30 mg/dL — AB
Specific Gravity, Urine: 1.039 — ABNORMAL HIGH (ref 1.005–1.030)
pH: 5 (ref 5.0–8.0)

## 2023-01-23 LAB — I-STAT CG4 LACTIC ACID, ED: Lactic Acid, Venous: 2.6 mmol/L (ref 0.5–1.9)

## 2023-01-23 LAB — CBC WITH DIFFERENTIAL/PLATELET
Abs Immature Granulocytes: 0.06 10*3/uL (ref 0.00–0.07)
Basophils Absolute: 0.1 10*3/uL (ref 0.0–0.1)
Basophils Relative: 1 %
Eosinophils Absolute: 0.1 10*3/uL (ref 0.0–0.5)
Eosinophils Relative: 1 %
HCT: 41.8 % (ref 36.0–46.0)
Hemoglobin: 13.4 g/dL (ref 12.0–15.0)
Immature Granulocytes: 1 %
Lymphocytes Relative: 36 %
Lymphs Abs: 4.5 10*3/uL — ABNORMAL HIGH (ref 0.7–4.0)
MCH: 26.6 pg (ref 26.0–34.0)
MCHC: 32.1 g/dL (ref 30.0–36.0)
MCV: 83.1 fL (ref 80.0–100.0)
Monocytes Absolute: 1 10*3/uL (ref 0.1–1.0)
Monocytes Relative: 8 %
Neutro Abs: 6.8 10*3/uL (ref 1.7–7.7)
Neutrophils Relative %: 53 %
Platelets: 358 10*3/uL (ref 150–400)
RBC: 5.03 MIL/uL (ref 3.87–5.11)
RDW: 13.9 % (ref 11.5–15.5)
WBC: 12.4 10*3/uL — ABNORMAL HIGH (ref 4.0–10.5)
nRBC: 0 % (ref 0.0–0.2)

## 2023-01-23 LAB — BASIC METABOLIC PANEL
Anion gap: 17 — ABNORMAL HIGH (ref 5–15)
Anion gap: 10 (ref 5–15)
BUN: 5 mg/dL — ABNORMAL LOW (ref 6–20)
BUN: 9 mg/dL (ref 6–20)
CO2: 19 mmol/L — ABNORMAL LOW (ref 22–32)
CO2: 21 mmol/L — ABNORMAL LOW (ref 22–32)
Calcium: 8.8 mg/dL — ABNORMAL LOW (ref 8.9–10.3)
Calcium: 9.2 mg/dL (ref 8.9–10.3)
Chloride: 100 mmol/L (ref 98–111)
Chloride: 103 mmol/L (ref 98–111)
Creatinine, Ser: 1.09 mg/dL — ABNORMAL HIGH (ref 0.44–1.00)
Creatinine, Ser: 1.15 mg/dL — ABNORMAL HIGH (ref 0.44–1.00)
GFR, Estimated: 60 mL/min — ABNORMAL LOW (ref >60)
GFR, Estimated: 56 mL/min — ABNORMAL LOW (ref >60)
Glucose, Bld: 145 mg/dL — ABNORMAL HIGH (ref 70–99)
Glucose, Bld: 252 mg/dL — ABNORMAL HIGH (ref 70–99)
Potassium: 3.3 mmol/L — ABNORMAL LOW (ref 3.5–5.1)
Potassium: 3.8 mmol/L (ref 3.5–5.1)
Sodium: 136 mmol/L (ref 135–145)
Sodium: 134 mmol/L — ABNORMAL LOW (ref 135–145)

## 2023-01-23 LAB — BRAIN NATRIURETIC PEPTIDE: B Natriuretic Peptide: 15.8 pg/mL (ref 0.0–100.0)

## 2023-01-23 LAB — LACTIC ACID, PLASMA
Lactic Acid, Venous: 2.1 mmol/L (ref 0.5–1.9)
Lactic Acid, Venous: 2.7 mmol/L (ref 0.5–1.9)

## 2023-01-23 LAB — TROPONIN I (HIGH SENSITIVITY): Troponin I (High Sensitivity): 3 ng/L (ref <18)

## 2023-01-23 LAB — MAGNESIUM: Magnesium: 1.7 mg/dL (ref 1.7–2.4)

## 2023-01-23 LAB — PROCALCITONIN: Procalcitonin: <0.1 ng/mL

## 2023-01-23 MED ORDER — LACTATED RINGERS IV BOLUS
500.0000 mL | Freq: Once | INTRAVENOUS | Status: AC
  Administered 2023-01-23: 500 mL via INTRAVENOUS

## 2023-01-23 MED ORDER — PREDNISONE 20 MG PO TABS
40.0000 mg | ORAL_TABLET | Freq: Every day | ORAL | Status: DC
  Administered 2023-01-24: 40 mg via ORAL
  Filled 2023-01-23: qty 2

## 2023-01-23 MED ORDER — ONDANSETRON 8 MG PO TBDP: 8.0000 mg | ORAL_TABLET | Freq: Three times a day (TID) | ORAL | 0 refills | Status: DC | PRN

## 2023-01-23 MED ORDER — METHYLPREDNISOLONE SODIUM SUCC 40 MG IJ SOLR
40.0000 mg | Freq: Once | INTRAMUSCULAR | Status: AC
  Administered 2023-01-23: 40 mg via INTRAVENOUS
  Filled 2023-01-23: qty 1

## 2023-01-23 MED ORDER — POTASSIUM CHLORIDE 20 MEQ PO PACK
40.0000 meq | PACK | Freq: Once | ORAL | Status: AC
  Administered 2023-01-23: 40 meq via ORAL
  Filled 2023-01-23: qty 2

## 2023-01-23 MED ORDER — AMITRIPTYLINE HCL 50 MG PO TABS
100.0000 mg | ORAL_TABLET | Freq: Every day | ORAL | Status: DC
  Administered 2023-01-23: 100 mg via ORAL
  Filled 2023-01-23: qty 2

## 2023-01-23 MED ORDER — FUROSEMIDE 40 MG PO TABS: 40.0000 mg | ORAL_TABLET | Freq: Every day | ORAL | 0 refills | Status: DC

## 2023-01-23 MED ORDER — AMLODIPINE BESYLATE 10 MG PO TABS
10.0000 mg | ORAL_TABLET | Freq: Every day | ORAL | Status: DC
  Administered 2023-01-23 – 2023-01-24 (×2): 10 mg via ORAL
  Filled 2023-01-23: qty 1
  Filled 2023-01-23: qty 2

## 2023-01-23 MED ORDER — FUROSEMIDE 10 MG/ML IJ SOLN
40.0000 mg | Freq: Once | INTRAMUSCULAR | Status: AC
  Administered 2023-01-23: 40 mg via INTRAVENOUS
  Filled 2023-01-23: qty 4

## 2023-01-23 MED ORDER — ONDANSETRON HCL 4 MG/2ML IJ SOLN
4.0000 mg | Freq: Once | INTRAMUSCULAR | Status: AC
  Administered 2023-01-23: 4 mg via INTRAVENOUS
  Filled 2023-01-23: qty 2

## 2023-01-23 MED ORDER — POTASSIUM CHLORIDE CRYS ER 20 MEQ PO TBCR: 20.0000 meq | EXTENDED_RELEASE_TABLET | Freq: Two times a day (BID) | ORAL | 0 refills | Status: DC

## 2023-01-23 MED ORDER — LEVOTHYROXINE SODIUM 50 MCG PO TABS
50.0000 ug | ORAL_TABLET | Freq: Every day | ORAL | Status: DC
  Administered 2023-01-24: 50 ug via ORAL
  Filled 2023-01-23: qty 1

## 2023-01-23 MED ORDER — TRIAMCINOLONE 0.1 % CREAM:EUCERIN CREAM 1:1
TOPICAL_CREAM | Freq: Three times a day (TID) | CUTANEOUS | Status: DC | PRN
  Administered 2023-01-24: 1 via TOPICAL
  Filled 2023-01-23: qty 1

## 2023-01-23 MED ORDER — MELOXICAM 7.5 MG PO TABS
15.0000 mg | ORAL_TABLET | Freq: Two times a day (BID) | ORAL | Status: DC | PRN
  Filled 2023-01-23: qty 2

## 2023-01-23 MED ORDER — POTASSIUM CHLORIDE CRYS ER 20 MEQ PO TBCR
40.0000 meq | EXTENDED_RELEASE_TABLET | Freq: Once | ORAL | Status: AC
  Administered 2023-01-23: 40 meq via ORAL
  Filled 2023-01-23: qty 2

## 2023-01-23 MED ORDER — IPRATROPIUM-ALBUTEROL 0.5-2.5 (3) MG/3ML IN SOLN
3.0000 mL | Freq: Once | RESPIRATORY_TRACT | Status: AC
  Administered 2023-01-23: 3 mL via RESPIRATORY_TRACT
  Filled 2023-01-23: qty 3

## 2023-01-23 MED ORDER — ALBUTEROL SULFATE HFA 108 (90 BASE) MCG/ACT IN AERS
1.0000 | INHALATION_SPRAY | Freq: Four times a day (QID) | RESPIRATORY_TRACT | 0 refills | Status: AC | PRN
Start: 2023-01-23 — End: ?

## 2023-01-23 MED ORDER — IOHEXOL 350 MG/ML SOLN
150.0000 mL | Freq: Once | INTRAVENOUS | Status: AC | PRN
  Administered 2023-01-23: 150 mL via INTRAVENOUS

## 2023-01-23 MED ORDER — RIVAROXABAN 10 MG PO TABS
10.0000 mg | ORAL_TABLET | Freq: Every day | ORAL | Status: DC
  Administered 2023-01-23 – 2023-01-24 (×2): 10 mg via ORAL
  Filled 2023-01-23 (×2): qty 1

## 2023-01-23 MED ORDER — MAGNESIUM SULFATE 2 GM/50ML IV SOLN
2.0000 g | Freq: Once | INTRAVENOUS | Status: AC
  Administered 2023-01-23: 2 g via INTRAVENOUS
  Filled 2023-01-23: qty 50

## 2023-01-23 MED ORDER — DIPHENHYDRAMINE HCL 50 MG/ML IJ SOLN
50.0000 mg | Freq: Once | INTRAMUSCULAR | Status: AC
  Administered 2023-01-23: 50 mg via INTRAVENOUS
  Filled 2023-01-23: qty 1

## 2023-01-23 MED ORDER — OXYCODONE HCL 5 MG PO TABS
5.0000 mg | ORAL_TABLET | Freq: Four times a day (QID) | ORAL | Status: DC | PRN
  Administered 2023-01-23 – 2023-01-24 (×3): 5 mg via ORAL
  Filled 2023-01-23 (×3): qty 1

## 2023-01-23 MED ORDER — IPRATROPIUM-ALBUTEROL 0.5-2.5 (3) MG/3ML IN SOLN
3.0000 mL | RESPIRATORY_TRACT | Status: DC | PRN
  Administered 2023-01-23: 3 mL via RESPIRATORY_TRACT
  Filled 2023-01-23: qty 3

## 2023-01-23 MED ORDER — IPRATROPIUM-ALBUTEROL 0.5-2.5 (3) MG/3ML IN SOLN
3.0000 mL | Freq: Three times a day (TID) | RESPIRATORY_TRACT | Status: DC
  Administered 2023-01-24 (×2): 3 mL via RESPIRATORY_TRACT
  Filled 2023-01-23 (×2): qty 3

## 2023-01-23 MED ORDER — METOPROLOL TARTRATE 25 MG PO TABS: 100.0000 mg | ORAL_TABLET | Freq: Two times a day (BID) | ORAL | Status: DC

## 2023-01-23 MED ORDER — IPRATROPIUM-ALBUTEROL 0.5-2.5 (3) MG/3ML IN SOLN
3.0000 mL | RESPIRATORY_TRACT | Status: AC
  Administered 2023-01-23 (×3): 3 mL via RESPIRATORY_TRACT
  Filled 2023-01-23 (×3): qty 3

## 2023-01-23 MED ORDER — METOPROLOL TARTRATE 50 MG PO TABS
50.0000 mg | ORAL_TABLET | Freq: Two times a day (BID) | ORAL | Status: DC
  Administered 2023-01-23 – 2023-01-24 (×2): 50 mg via ORAL
  Filled 2023-01-23 (×2): qty 1

## 2023-01-23 MED ORDER — AZITHROMYCIN 250 MG PO TABS
500.0000 mg | ORAL_TABLET | Freq: Every day | ORAL | Status: DC
  Administered 2023-01-23: 500 mg via ORAL
  Filled 2023-01-23: qty 2

## 2023-01-23 MED ORDER — DIPHENHYDRAMINE HCL 25 MG PO CAPS
50.0000 mg | ORAL_CAPSULE | Freq: Once | ORAL | Status: DC
  Filled 2023-01-23: qty 2

## 2023-01-23 NOTE — H&P (Cosign Needed Addendum)
Date: 01/23/2023               Patient Name:  Brianna Roberts MRN: [id number]  DOB: 1965/09/01 Age / Sex: 57 y.o., female   PCP: Sharmon Revere, MD         Medical Service: Internal Medicine Teaching Service         Attending Physician: Dr. Coral Spikes, DO      First Contact: Dr. Kathleen Lime, MD Pager [phone]    Second Contact: Dr. [name], DO Pager [phone]         After Hours (After 5p/  First Contact Pager: [phone]  weekends / holidays): Second Contact Pager: [phone]   SUBJECTIVE   Chief Complaint: shortness of breath, congestion  History of Present Illness:  Brianna Roberts is a 57 year old with PMH of HTLV-1, COPD, tobacco use disorder, fibromyalgia who presents with 2 weeks of chills and congestion followed by 3 days of worsening shortness of breath. She has a productive cough with thick mucus that is hard to cough up.  She reports pleuritic chest pain with coughing and deep inspiration. She has been using combo inhaler then ran out of her rescue inhaler over the weekend.  She previously had nebulizer but couldn't find the machine.  She presented to the ED 7/30 due to symptoms and was discharged with antibiotics which she was not able to pick up before needing to come back to the ED. She had several episodes of vomiting and diarrhea in the last few days and has not been able to eat much. She denies bloody emesis and blood in stool. She has a history of IBS-D and diarrhea feels slightly more frequent than baseline.   Following CT scan in ED she desaturated into upper 80s and was placed on 3 L [place] with rapid improvement.  Meds:  Amitriptyline 100 Amolidine 10 Symbicort  Albuterol Diazepam 5 mg prn Furosmide 40 mg BID Synthryroid 50 Meloxicam PRN Metrolol tartrate 100 bid Oxycodone 10 mg PRN has been taking 1-2 times in last week. 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 Delia Chimes with [address] Substances: smokes cigarettes, 1/2 PPD since 17, no alcohol use,   Allergies: Allergies as of 01/23/2023 - Review Complete 01/23/2023  Allergen Reaction Noted   Hydromorphone Itching 03/20/2015   Iodinated contrast media Anaphylaxis, Swelling, and Other (See Comments) 02/01/2017   Sulfa antibiotics Anaphylaxis 09/29/2016   Codeine Other (See Comments) 03/20/2015   Nickel Rash 12/13/2017   Penicillins Rash and Other (See Comments) 03/20/2015   Spironolactone Nausea Only and Other (See Comments) 03/20/2015   Cefdinir Hives 07/17/2022   Adhesive [tape] Rash 12/13/2017    Review of Systems: A complete ROS was negative except as per HPI.  OBJECTIVE:   Physical Exam: Blood pressure 116/84, pulse (!) 103, temperature 98 F (36.7 C), resp. rate 16, height 5\' 9"  (1.753 m), weight (!) 168 kg, SpO2 98%.  Constitutional: in no acute distress, non toxic appearinge Cardiovascular: regular rate and rhythm, no m/r/g Pulmonary/Chest: normal work of breathing on room air, lungs with mild wheezing to lower lung fields bilaterally Abdominal: soft, non-tender, non-distended MSK: trace edema to lower extremities, well healed scar to right upper back area without surrounding erythema or edema Neurological: antalgic gait when coming back from bathroom  Labs: CBC    Component Value Date/Time   WBC 12.4 (H)  01/23/2023 0050   RBC 5.03 01/23/2023 0050   HGB 13.4 01/23/2023 0050   HGB 13.5 08/09/2017 1059   HCT 41.8 01/23/2023 0050   HCT 41.0 08/09/2017 1059   PLT 358 01/23/2023 0050   PLT 310 08/09/2017 1059   MCV 83.1 01/23/2023 0050   MCV 83.2 12/19/2017 1259   MCV 81 08/09/2017 1059   MCH 26.6 01/23/2023 0050   MCHC 32.1 01/23/2023 0050   RDW 13.9 01/23/2023 0050   RDW 14.8 08/09/2017 1059   LYMPHSABS 4.5 (H) 01/23/2023 0050   LYMPHSABS 3.7 (H) 08/09/2017 1059   MONOABS 1.0 01/23/2023 0050   EOSABS 0.1 01/23/2023 0050   EOSABS 0.2 08/09/2017 1059   BASOSABS 0.1 01/23/2023 0050   BASOSABS 0.1 08/09/2017 1059     CMP     Component Value Date/Time   NA 136 01/23/2023 0050   NA 144 12/19/2017 1409   K 3.3 (L) 01/23/2023 0050   CL 100 01/23/2023 0050   CO2 19 (L) 01/23/2023 0050   GLUCOSE 145 (H) 01/23/2023 0050   BUN 5 (L) 01/23/2023 0050   BUN 11 12/19/2017 1409   CREATININE 1.09 (H) 01/23/2023 0050   CREATININE 0.87 04/27/2017 1654   CALCIUM 8.8 (L) 01/23/2023 0050   PROT 6.9 01/22/2023 1809   PROT 7.1 08/09/2017 1059   ALBUMIN 3.6 01/22/2023 1809   ALBUMIN 4.2 08/09/2017 1059   AST 14 (L) 01/22/2023 1809   ALT 20 01/22/2023 1809   ALKPHOS 68 01/22/2023 1809   BILITOT 0.8 01/22/2023 1809   BILITOT 0.5 08/09/2017 1059   GFRNONAA 60 (L) 01/23/2023 0050   GFRAA >60 11/04/2019 1617    Imaging:  EKG: personally reviewed my interpretation is sinus tachycardia  ASSESSMENT & PLAN:   Assessment & Plan by Problem: Active Problems:   * No active hospital problems. *   Brianna Roberts is a 57 y.o. person living with a history of HTLV-1, COPD, tobacco use disorder, fibromyalgia who presented with shortness of breath, increased sputum production and admitted for COPD exacerbation on hospital day 0  Dyspnea COPD exacerbation She presents with several days of increased dyspnea, productive cough with thick mucus following a recent likely viral URI. Covid, Flu and RSV  negative. BNP will be lower with obesity, but with it being at 15 I am less concerned for HFpEF exacerbation. CTA PE was completed and could not completely rule out PE but her presentation is more consistent with COPD exacerbation.  She was given IV steroid in ED and started on nebs. Home medications include Symbicort and Albuterol. She has never had PFTs completed. There were no focal consolidations on CXR, but with elevated lactate threshold to start CAP coverage is low. Will trial bolus and repeat lactate. If elevated >3 following bolus would add ceftriaxone and check pro-cal for tomorrow AM. I was able to wean supplemental  oxygen to room air during interview and ambulatory saturations remained >91%.  -prednisone 40 mg, 4 additional days -azithromycin -duonebs scheduled, home inhaler is ICS- LABA would add LAMA once wheezing improves -add CAP abx of lactate remains elevated, if so check pro-cal  Leukocytosis Wbc mildly elevated at 12.4. Steroids were given in ED after CBC. COVID, RSV and flu negative. Differentials include CAP versus UTI. She was recently treated for UTI a few weeks ago and feels that she started having dysuria again a few days ago. She has had several episodes of vomiting in last several days but no further episodes today and she is not having abdominal pain. -trend CBC -UA - if lactic remains elevated would start IV ceftriaxone and get pro cal  AGMA Elevated lactate Bicarb at 18 with AG of 17. Lactate elevated at 2.6. With her recent history of vomiting and diarrhea I will giver her fluid bolus to see if this helps. Of note she was admitted in May due to skin abscess on back. This was lanced and she followed for packing. On my exam her incision on back is well healed and I do not appreciate erythema or edema surround scar. I am not concerned for infection at that site at this time. -recheck lactate 6pm -PM BMP  Hypokalemia Medication list includes Kclor 20 meq BID, which  she has missed several doses in the last few days with feeling unwell. -trend BMP -supplement K >4  Prolonged qtc QTC at 491. If she has additional episodes of nausea would using anti-emetic sparingly.  HTLV-1 She follows with [facility] for this. Is asymptomatic.  History of chronic HFpEF Home medications includes furosemide 40 mg BID. Last Echo completed in 2019 and showed EF 60-65% with no wall motion abnormalities. I have low suspician for acute exacerbation. She received one dose of IV furosemide in ED. -consider restarting furosemide tomorrow -daily weights -strict I and Os  Fibromyalgia Home medications include Amitriptyline 100 mg at bedtime, Diazepam 5 mg prn, Meloxicam PRN, Oxycodone 10 mg PRN has been taking 1-2 times in last week. She is limited with ambulation for last several years due to pain. -PT/ OT referral  History of shingles She had an outbreak several weeks ago and recently finished up treatment for this.  Rash was located on right back in thoracolumbar region.  No rash on exam.  Hypertension Home medications include amlodipine 10 mg and Metrolol tartrate 100 bid. I am unsure of why she is not one ARB/ACEI vs thiazide.  -continue amlodipine -hold metoprolol for now  Hypothyroidism Restart Synthryroid 50  OSA Cpap at bedtime ordered  Diet: regular VTE: DOAC Code: Full  Prior to Admission Living Arrangement: Home, living alone Anticipated Discharge Location: Home Barriers to Discharge: improvement in dyspnea  Dispo: Admit patient to Observation with expected length of stay less than 2 midnights.  Signed: Rudene Christians, DO Internal Medicine Resident PGY-3  01/23/2023, 11:02 AM

## 2023-01-23 NOTE — ED Notes (Signed)
Pts car is located at [employer] [address]; [phone]

## 2023-01-23 NOTE — ED Notes (Signed)
ED TO INPATIENT HANDOFF REPORT  ED Nurse Name and Phone #: Judeen Geralds [phone]  S Name/Age/Gender Brianna Roberts 57 y.o. female Room/Bed: 037C/037C  Code Status   Code Status: Full Code  Home/SNF/Other Home Patient oriented to: self, place and time Is this baseline? 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 Pt was headed home  from the hospital and had sudden onset of SOB and increased pain at back pain around incision. Pt states "feels like a weight is in the incision causing pressure." Pt also states her ribs are sore and she can't take full breath.     Allergies Allergies  Allergen Reactions   Hydromorphone Itching   Iodinated Contrast Media Anaphylaxis, Swelling and Other (See Comments)    Patient stated this was a "one off" Patient complained of throat/ uvula swelling s/p injection on 02/01/2017 but states she isnt allergic to the dye and has had several times prior     Sulfa Antibiotics Anaphylaxis   Codeine Other (See Comments)    Patient said mother told her she was allergic as a child; patient states she is not allergic   Nickel Rash    Patient states she is not allergic   Penicillins Rash and Other (See Comments)    Told she was allergic from childhood: patient states she is not allergic   Spironolactone Nausea Only and Other (See Comments)    Caused GI isues   Cefdinir Hives   Adhesive [Tape] Rash    Level of Care/Admitting Diagnosis ED Disposition     ED Disposition  Admit   Condition  --   Comment  Hospital Area: [facility] [[id number]]  Level of Care: Telemetry Medical [104]  May place patient in observation at [facility] or [facility] if equivalent level of care is available:: No  Covid Evaluation: Asymptomatic - no recent exposure (last 10 days) testing not required  Diagnosis: COPD exacerbation [facility] [[id number]]  Admitting Physician: Mercie Eon [[id number]]  Attending Physician: Mercie Eon [[id number]]          B Medical/Surgery History 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    Concussion    DDD (degenerative disc disease), cervical    Depression    Fibromyalgia    HIV (human immunodeficiency virus infection) (HCC)    Homelessness    Hypertension    Hypothyroidism    Insomnia    Neuromuscular disorder (HCC)    Thyroid disease    TIA (transient ischemic attack)    Past Surgical History:  Procedure Laterality Date   ABDOMINAL HYSTERECTOMY     Partial-ovaries and tubes remain   BREAST BIOPSY Left    CHOLECYSTECTOMY  2006   COLONOSCOPY  07/27/2015   [place]   HERNIA REPAIR  1997   TUMOR EXCISION Right 1984   Sinuses   TUMOR EXCISION Left 2015   sinuses   URACHAL CYST EXCISION  1997     A IV Location/Drains/Wounds Patient Lines/Drains/Airways Status     Active Line/Drains/Airways     Name Placement date Placement time Site Days   Peripheral IV 01/23/23 18 G Anterior;Left;Proximal Forearm 01/23/23  --  Forearm  less than 1            Intake/Output Last 24  hours No intake or output data in the 24 hours ending 01/23/23 1641  Labs/Imaging Results for orders placed or performed during the hospital encounter of 01/23/23 (from the past 48 hour(s))  Basic metabolic panel     Status: Abnormal   Collection Time: 01/23/23 12:50 AM  Result Value Ref Range   Sodium 136 135 - 145 mmol/L   Potassium 3.3 (L) 3.5 - 5.1 mmol/L   Chloride 100 98 - 111 mmol/L   CO2 19 (L) 22 - 32 mmol/L   Glucose, Bld 145 (H) 70 - 99 mg/dL    Comment: Glucose reference range applies only to samples taken after fasting for at least 8 hours.   BUN 5 (L) 6 - 20 mg/dL   Creatinine, Ser 1.61 (H) 0.44 - 1.00 mg/dL   Calcium 8.8 (L) 8.9 - 10.3 mg/dL   GFR, Estimated 60 (L) >60 mL/min    Comment:  (NOTE) Calculated using the CKD-EPI Creatinine Equation (2021)    Anion gap 17 (H) 5 - 15    Comment: Performed at [facility], [address] [place], [place] [address]  CBC with Differential     Status: Abnormal   Collection Time: 01/23/23 12:50 AM  Result Value Ref Range   WBC 12.4 (H) 4.0 - 10.5 K/uL   RBC 5.03 3.87 - 5.11 MIL/uL   Hemoglobin 13.4 12.0 - 15.0 g/dL   HCT 54.0 98.1 - 19.1 %   MCV 83.1 80.0 - 100.0 fL   MCH 26.6 26.0 - 34.0 pg   MCHC 32.1 30.0 - 36.0 g/dL   RDW 47.8 29.5 - 62.1 %   Platelets 358 150 - 400 K/uL   nRBC 0.0 0.0 - 0.2 %   Neutrophils Relative % 53 %   Neutro Abs 6.8 1.7 - 7.7 K/uL   Lymphocytes Relative 36 %   Lymphs Abs 4.5 (H) 0.7 - 4.0 K/uL   Monocytes Relative 8 %   Monocytes Absolute 1.0 0.1 - 1.0 K/uL   Eosinophils Relative 1 %   Eosinophils Absolute 0.1 0.0 - 0.5 K/uL   Basophils Relative 1 %   Basophils Absolute 0.1 0.0 - 0.1 K/uL   Immature Granulocytes 1 %   Abs Immature Granulocytes 0.06 0.00 - 0.07 K/uL    Comment: Performed at [facility], [address], [place], [place] [address]  Troponin I (High Sensitivity)     Status: None   Collection Time: 01/23/23 12:50 AM  Result Value Ref Range   Troponin I (High Sensitivity) 3 <18 ng/L    Comment: (NOTE) Elevated high sensitivity troponin I (hsTnI) values and significant  changes across serial measurements may suggest ACS but many other  chronic and acute conditions are known to elevate hsTnI results.  Refer to the "Links" section for chest pain algorithms and additional  guidance. Performed at [facility], [address], [place], [place] [address]   Brain natriuretic peptide     Status: None   Collection Time: 01/23/23 12:50 AM  Result Value Ref Range   B Natriuretic Peptide 15.8 0.0 - 100.0 pg/mL    Comment: Performed at [facility], [address] [place], [place] [address]  Magnesium     Status: None   Collection Time: 01/23/23 12:50 AM   Result Value Ref Range   Magnesium 1.7 1.7 - 2.4 mg/dL    Comment: Performed at [facility], [address], [place], [place] [address]  Lactic acid,  plasma     Status: Abnormal   Collection Time: 01/23/23  3:01 PM  Result Value Ref Range   Lactic Acid, Venous 2.1 (HH) 0.5 - 1.9 mmol/L    Comment: ATTEMPTED TO CALL @1550  CRITICAL RESULT CALLED TO, READ BACK BY AND VERIFIED WITH MEGAN ROBBERT CHARGE NURSE @1605  07.31.2024 E.AHMED Performed at [facility], [address], [place], [place] [address]   I-Stat CG4 Lactic Acid     Status: Abnormal   Collection Time: 01/23/23  3:41 PM  Result Value Ref Range   Lactic Acid, Venous 2.6 (HH) 0.5 - 1.9 mmol/L   Comment NOTIFIED PHYSICIAN    CT Angio Chest PE W and/or Wo Contrast  Result Date: 01/23/2023 CLINICAL DATA:  Sudden onset shortness of breath. EXAM: CT ANGIOGRAPHY CHEST WITH CONTRAST TECHNIQUE: Multidetector CT imaging of the chest was performed using the standard protocol during bolus administration of intravenous contrast. Multiplanar CT image reconstructions and MIPs were obtained to evaluate the vascular anatomy. RADIATION DOSE REDUCTION: This exam was performed according to the departmental dose-optimization program which includes automated exposure control, adjustment of the mA and/or kV according to patient size and/or use of iterative reconstruction technique. CONTRAST:  OMNIPAQUE IOHEXOL 350 MG/ML SOLN COMPARISON:  Chest x-ray from yesterday. FINDINGS: Cardiovascular: No central or lobar pulmonary embolism. The segmental and subsegmental pulmonary arteries are poorly evaluated due to contrast bolus timing, patient body habitus, and mixing artifact. Dilated main pulmonary artery measuring up to 3.6 cm in diameter. Normal heart size. No pericardial effusion. No thoracic aortic aneurysm or dissection. Mediastinum/Nodes: No enlarged mediastinal, hilar, or axillary lymph nodes. Thyroid gland, trachea, and esophagus  demonstrate no significant findings. Lungs/Pleura: Mild dependent subsegmental atelectasis in both lungs. No focal consolidation, pleural effusion, or pneumothorax. Upper Abdomen: No acute abnormality. Musculoskeletal: No chest wall abnormality. No acute or significant osseous findings. Inflammatory change in the midline back at T6-T7 has significantly improved since April. Review of the MIP images confirms the above findings. IMPRESSION: 1. No central or lobar pulmonary embolism. Non-diagnostic evaluation of the segmental and subsegmental pulmonary arteries, despite repeat attempt. If there is continued clinical concern for pulmonary embolism, consider V/Q scan. 2. Dilated main pulmonary artery, suggestive of pulmonary arterial hypertension. Electronically Signed   By: Obie Dredge M.D.   On: 01/23/2023 09:42   DG Chest Port 1 View  Result Date: 01/22/2023 CLINICAL DATA:  Cough and shortness of breath EXAM: PORTABLE CHEST 1 VIEW COMPARISON:  07/17/2022 FINDINGS: Heart is enlarged with diffuse bilateral vascular and increased interstitial opacities favored to be edema related to CHF. Difficult to exclude superimposed pneumonia. No large effusion or pneumothorax. Trachea midline. Aorta atherosclerotic. IMPRESSION: Cardiomegaly with vascular congestion and interstitial edema pattern. Electronically Signed   By: Judie Petit.  Shick M.D.   On: 01/22/2023 19:02    Pending Labs Unresulted Labs (From admission, onward)     Start     Ordered   01/24/23 0500  Basic metabolic panel  Tomorrow morning,   R        01/23/23 1226   01/24/23 0500  CBC  Tomorrow morning,   R        01/23/23 1226   01/23/23 1449  Lactic acid, plasma  (Lactic Acid)  STAT Now then every 3 hours,   R (with STAT occurrences)      01/23/23 1448   01/23/23 1230  Urinalysis, Routine w reflex microscopic -Urine, Clean Catch  Once,   R  Question:  Specimen Source  Answer:  Urine, Clean Catch   01/23/23 1230             Vitals/Pain Today's Vitals   01/23/23 1329 01/23/23 1400 01/23/23 1530 01/23/23 1610  BP: 126/84  124/71 112/72  Pulse:   (!) 114 (!) 117  Resp:    (!) 23  Temp:  98.1 F (36.7 C)  98 F (36.7 C)  TempSrc:    Oral  SpO2:   94% 99%  Weight:      Height:        Isolation Precautions No active isolations  Medications Medications  rivaroxaban (XARELTO) tablet 10 mg (10 mg Oral Given 01/23/23 1622)  ipratropium-albuterol (DUONEB) 0.5-2.5 (3) MG/3ML nebulizer solution 3 mL (3 mLs Nebulization Given 01/23/23 1621)  predniSONE (DELTASONE) tablet 40 mg (has no administration in time range)  amitriptyline (ELAVIL) tablet 100 mg (has no administration in time range)  amLODipine (NORVASC) tablet 10 mg (10 mg Oral Given 01/23/23 1329)  azithromycin (ZITHROMAX) tablet 500 mg (500 mg Oral Given 01/23/23 1329)  levothyroxine (SYNTHROID) tablet 50 mcg (has no administration in time range)  meloxicam (MOBIC) tablet 15 mg (has no administration in time range)  oxyCODONE (Oxy IR/ROXICODONE) immediate release tablet 5 mg (5 mg Oral Given 01/23/23 1640)  ipratropium-albuterol (DUONEB) 0.5-2.5 (3) MG/3ML nebulizer solution 3 mL (3 mLs Nebulization Given 01/23/23 0100)  furosemide (LASIX) injection 40 mg (40 mg Intravenous Given 01/23/23 0100)  ondansetron (ZOFRAN) injection 4 mg (4 mg Intravenous Given 01/23/23 0100)  methylPREDNISolone sodium succinate (SOLU-MEDROL) 40 mg/mL injection 40 mg (40 mg Intravenous Given 01/23/23 0320)  diphenhydrAMINE (BENADRYL) injection 50 mg (50 mg Intravenous Given 01/23/23 0626)  potassium chloride SA (KLOR-CON M) CR tablet 40 mEq (40 mEq Oral Given 01/23/23 0758)  iohexol (OMNIPAQUE) 350 MG/ML injection 150 mL (150 mLs Intravenous Contrast Given 01/23/23 0849)  ipratropium-albuterol (DUONEB) 0.5-2.5 (3) MG/3ML nebulizer solution 3 mL (3 mLs Nebulization Given 01/23/23 1325)  magnesium sulfate IVPB 2 g 50 mL (0 g Intravenous Stopped 01/23/23 1622)  potassium chloride  (KLOR-CON) packet 40 mEq (40 mEq Oral Given 01/23/23 1328)  lactated ringers bolus 500 mL (500 mLs Intravenous New Bag/Given 01/23/23 1622)    Mobility walks     Focused Assessments Pulmonary Assessment Handoff:  Lung sounds:   O2 Device: Room Air      R Recommendations: See Admitting Provider Note  Report given to:   Additional Notes:

## 2023-01-23 NOTE — Hospital Course (Addendum)
# COPD exacerbation Patient presented to the ED with concerns of worsening shortness of breath requiring 4 L of oxygen transiently improved few minutes after having DuoNeb treatments in the ED. Her  history and clinical presentation was highly suspicious for COPD exacerbation. Imaging and labs made heart failure exacerbation pulmonary embolism community-acquired pneumonia less suspicious.Both chest CT and x-ray did not show any evidence of infectious processes .COPD exacerbation treatment then initiated with prednisone 40 mg, DuoNebs treatment, azithromycin and later transferred to the floors. Labs was relevant for elevated white count likely due to the steroids she was given at the ED. She initially presented with lactate of 2.6, gave her some fluids but that did not help much, lactate is still elevated at 2.4 at this time. However patient doing very well ,sating well on room air with no need for oxygen support at this time even with exertion. Seems to have improved significantly compared to her initial presentation.All her home inhalers stopped at this time and replace it with Trelegy discharge.   #Leukocytosis #Concern for UTI Patient reported a history of UTI 2 weeks ago for which she finished a course of antibiotics.  Also reported burning with urination on admission.  For UA which did not show any evidence of infection at that time, however, was treated empirically with Augmentin the intent of following up on her upcoming outpatient visit.  #Hyperlactatemia Patient presented with a lactate of 2.6, bicarb of 18 and anion gap of 17, was given fluids and anion gap is closed from 17-10 within 24hrs of admission.However lactate remained fairly unchanged 2.1-2.2  and we thought it could likely due to hypoxia.  Patient was initially given 500 cc bolus lactated ringer was given today and continued to trend her lactate  #History Of HFpEF History of heart failure with preserved ejection fraction  currently being managed with Lasix 40 mg twice daily. No changes were made to this regimen  #Fibromyalgia Has a history of fibromyalgia currently being managed with amitriptyline 100 mg diazepam 5 mg as needed and meloxicam as needed and oxycodone.  No changes to her fibromyalgia regimen was made during this encounter   #obstructive sleep apnea Uses CPAP at night.                     [facility] housecalls  PMH HIV, Asthma, Hypothyroid, fibromyalgia  URI symptoms congestion Vomiting  SOB couldn't keep anything down  Hypotensive 79/64 HR 100s  Laix IV50 Steroid K 40 K3.3 Mg 1.7 BNP 15.8 Trop 3  IMPRESSION: 1. No central or lobar pulmonary embolism. Non-diagnostic evaluation of the segmental and subsegmental pulmonary arteries, despite repeat attempt. If there is continued clinical concern for pulmonary embolism, consider V/Q scan. 2. Dilated main pulmonary artery, suggestive of pulmonary arterial hypertension.  AGMA Bicarb 19 w ag 17  Leukocytosis wbc 12.4  Lost weight  Ca 808  [facility] Dr Trudee Kuster 07/31  Medications: Valcyclovir 1 g daily Amitriptulone 100 Amolidine 10 Symbicort  Albuterol Diazepam 5 mg prn Furosmide 40 mg BID Synthryroid 50 Meloxicam PRN Metrolol t 100 bid Oxycodone 10 mg PRN has been taking 1-2 times in last week. Kclor 20 meq BID    08/01 PT: mentions trouble finishing sentences, with pauses.  Patient reports feeling different, she is moving some more air in her lungs compared to yesterday. She is able to ambulate to bathroom. She said that she has been feeling feverish, temperature 98. Says low grade fever. She is feeling incredibly fatigues and low  energy.   Dysuria, unable to fully empty her bladder.  Surgery? Gave her pain medications.  Recent UTI, finished ABX a week ago, another UTI now.   Says chest pressure has improved. But reports trouble breathing and pain localized to right lower chest and right lateral  chest. Says taht she is SOB at rest as well. Says she gets SOB with going to bathroom, that is her baseline. Says she is not at her baseline after 2 surgeries. She is not doing well after her surgeries, progressively declining.   PE: Wheezing has improved.   Plan:  Presumed COPD. Tobacco use. Follow up outpatient to get formal PFTs. Responded well to breathing treatments, Abx, steriods. Hypoxia has resolved. She is able to ambulate short distance without desatting. Waiting on insurance to approve triple therapy. Add 5 day course of steroids. Add abx to treatment both UTI and COPD.  Wait PT evaluation. Patient wants to go home with home health and physical therapy. She wants to go home.   Patient is concerned about her enlarged heart. She would like to transfer her PCP to [facility] here. And would like to schedule her appointment here.  Home: new albuterol, Short course of steroids, antibiotics, and a new triple therapy inhaler.

## 2023-01-23 NOTE — Progress Notes (Addendum)
Visited patient after receiving signout from day team. She had a new requirement of 4L oxygen [place] this evening. She has had an elevated lactic acid, most recent 2.7 post fluid bolus. AG closed from 17 to 10. Talking to Ms Ventry, she states that she is feeling better than she was earlier today, nausea has been relieved and she is not experiencing dyspnea. She notes great fatigue and has not slept well for the last few nights. She does have an appetite and was preparing to eat dinner. She had expiratory wheezes on exam, no rales or rhonchi. Trace edema in b/l LE. We weaned her oxygen entirely, pts saturation remained near 100 and she did not have dyspnea.  Pts case manager contact info: Adys Felton [place] [phone] , [email or web address]   Plan + bolus LR per lactic acidosis, repeat lactic acid this evening, procal unremarkable do not suspect CAP, awaiting UA.

## 2023-01-23 NOTE — ED Notes (Signed)
Pt ambulated while on the pulse ox, saturations maintained above 91%. Pt relays sharp chest pain while walking with increased work of breathing.

## 2023-01-23 NOTE — ED Provider Notes (Addendum)
[place] EMERGENCY DEPARTMENT AT [facility] Provider Note   CSN: [id number] Arrival date & time: 01/23/23  0032     History  Chief Complaint  Patient presents with   Shortness of Breath    Brianna Roberts is a 57 y.o. female.   Shortness of Breath She has history of hypertension, asthma and comes in because of worsening shortness of breath.  She was seen in the emergency department earlier today and diagnosed with pneumonia and discharged with prescriptions for amoxicillin and doxycycline.  However, she states that she has been having problems with nausea and vomiting over the last 5 days and has not been able to take her medications including furosemide.  She had recurrence of nausea and vomited a large amount after being discharged.  She got much more dyspneic following the emesis.  She called for an ambulance who gave her a nebulizer treatment and she states she is feeling somewhat better following that.  She also received methylprednisolone.  She does relate she has had fever and chills, denies chest pain.  Cough has been productive of sputum, but she cannot tell me the color.   Home Medications Prior to Admission medications   Medication Sig Start Date End Date Taking? Authorizing Provider  acetaminophen (TYLENOL) 325 MG tablet Take 650 mg by mouth every 6 (six) hours as needed (for pain).    [provider]  acyclovir (ZOVIRAX) 200 MG capsule Take 1 capsule (200 mg total) by mouth 2 (two) times daily. For Outbreak: 2 capsules (400 mg) TID x 5 days. Patient taking differently: Take 400 mg by mouth 2 (two) times daily. 11/01/17   Porfirio Oar, PA  albuterol (PROVENTIL HFA;VENTOLIN HFA) 108 (90 Base) MCG/ACT inhaler Inhale 1-2 puffs into the lungs every 6 (six) hours as needed for wheezing or shortness of breath. 08/09/17   Porfirio Oar, PA  amitriptyline (ELAVIL) 100 MG tablet Take 100 mg by mouth at bedtime. 04/17/21   [provider]  amLODipine  (NORVASC) 10 MG tablet Take 1 tablet (10 mg total) by mouth daily. 08/21/17   Porfirio Oar, PA  amoxicillin (AMOXIL) 500 MG capsule Take 1 capsule (500 mg total) by mouth 3 (three) times daily. 01/22/23   Fayrene Helper, PA-C  amoxicillin-clavulanate (AUGMENTIN) 875-125 MG tablet Take 1 tablet by mouth 2 (two) times daily. 04/09/21   [provider]  ARIPiprazole (ABILIFY) 10 MG tablet Take 1 tablet (10 mg total) by mouth daily. 10/05/16   Porfirio Oar, PA  ASPERCREME LIDOCAINE EX Apply 1 application topically 2 (two) times daily as needed (for muscle pain).    [provider]  azelastine (ASTELIN) 0.1 % nasal spray USE 2 SPRAYS EACH NOSTRIL TWICE DAILY. Patient taking differently: Place 2 sprays into both nostrils 2 (two) times daily. 10/15/17   Porfirio Oar, PA  budesonide-formoterol (SYMBICORT) 160-4.5 MCG/ACT inhaler Inhale 2 puffs into the lungs 2 (two) times daily. Patient not taking: Reported on 04/22/2021 12/27/16   Porfirio Oar, PA  clobetasol ointment (TEMOVATE) 0.05 % Apply 1 application topically as needed (for [place]). 09/12/18   [provider]  dexamethasone (DECADRON) 2 MG tablet Take 5 tablets (10 mg total) by mouth once as needed for up to 1 dose. 04/22/21   Tilden Fossa, MD  diazepam (VALIUM) 5 MG tablet Take 1 tablet (5 mg total) by mouth every 8 (eight) hours as needed for anxiety. Patient taking differently: Take 5 mg by mouth 2 (two) times daily. 10/14/17   Porfirio Oar,  PA  doxycycline (VIBRAMYCIN) 100 MG capsule Take 1 capsule (100 mg total) by mouth 2 (two) times daily. 01/22/23   Fayrene Helper, PA-C  fluticasone (FLONASE) 50 MCG/ACT nasal spray USE 1 SPRAY IN EACH NOSTRIL DAILY. 04/02/17   Porfirio Oar, PA  furosemide (LASIX) 40 MG tablet Take 1 tablet (40 mg total) by mouth 2 (two) times daily. Patient taking differently: Take 80 mg by mouth at bedtime. 08/09/17   Porfirio Oar, PA  glucose blood test strip Use as instructed 04/17/17    Porfirio Oar, PA  hydrOXYzine (ATARAX/VISTARIL) 10 MG tablet Take 1 tablet (10 mg total) by mouth at bedtime. Patient taking differently: Take 10 mg by mouth 2 (two) times daily. 10/05/16   Porfirio Oar, PA  Incontinence Supply Disposable (PREVAIL WASHCLOTHS) MISC Use daily as directed 06/03/17   Porfirio Oar, PA  ipratropium-albuterol (DUONEB) 0.5-2.5 (3) MG/3ML SOLN Take 3 mLs by nebulization every 6 (six) hours as needed (for breathing). Patient not taking: Reported on 04/22/2021 10/05/16   Porfirio Oar, PA  Lancets Thin MISC Use to check home glucose daily 03/28/17   Porfirio Oar, PA  levothyroxine (SYNTHROID, LEVOTHROID) 50 MCG tablet Take 1 tablet (50 mcg total) by mouth daily before breakfast. Patient taking differently: Take 50 mcg by mouth daily. 08/17/17   Porfirio Oar, PA  losartan (COZAAR) 100 MG tablet Take 1 tablet (100 mg total) by mouth daily. Patient not taking: Reported on 04/22/2021 08/09/17   Porfirio Oar, PA  meloxicam (MOBIC) 15 MG tablet TAKE 1 TABLET EACH DAY. Patient taking differently: Take 15 mg by mouth 2 (two) times daily as needed for pain. 11/08/17   Porfirio Oar, PA  metoprolol tartrate (LOPRESSOR) 100 MG tablet Take 1 tablet (100 mg total) by mouth 2 (two) times daily. 08/09/17   Porfirio Oar, PA  Nerve Stimulator (PRO COMFORT TENS UNIT) DEVI 1 Device by Does not apply route daily as needed. 12/27/16   Porfirio Oar, PA  nystatin cream (MYCOSTATIN) Apply 1 application topically 2 (two) times daily.    [provider]  oxyCODONE (OXY IR/ROXICODONE) 5 MG immediate release tablet Take 1 tablet (5 mg total) by mouth every 4 (four) hours as needed for severe pain. 10/15/17   Porfirio Oar, PA  potassium chloride SA (K-DUR,KLOR-CON) 20 MEQ tablet Take 1 tablet (20 mEq total) by mouth 2 (two) times daily. 06/24/17   Porfirio Oar, PA  triamcinolone ointment (KENALOG) 0.5 % Apply 1 application topically daily.    [provider]   vitamin B-12 (CYANOCOBALAMIN) 100 MCG tablet Take 100 mcg by mouth once a week.    [provider]  Vitamin D, Ergocalciferol, (DRISDOL) 1.25 MG (50000 UNIT) CAPS capsule Take 50,000 Units by mouth once a week. On Saturday 03/07/21   [provider]      Allergies    Hydromorphone, Iodinated contrast media, Sulfa antibiotics, Codeine, Nickel, Penicillins, Spironolactone, Cefdinir, and Adhesive [tape]    Review of Systems   Review of Systems  Respiratory:  Positive for shortness of breath.   All other systems reviewed and are negative.   Physical Exam Updated Vital Signs There were no vitals taken for this visit. Physical Exam Vitals and nursing note reviewed.   57 year old female, appears dyspneic at rest. Vital signs are significant for elevated blood pressure, heart rate, respiratory rate. Oxygen saturation is 100%, which is normal. Head is normocephalic and atraumatic. PERRLA, EOMI. Oropharynx is clear. Neck is nontender and supple without adenopathy or JVD. Back is  nontender and there is no CVA tenderness.  There is trace presacral edema.  Scars present on the right mid back from abscess drainage. Lungs are clear without rales, wheezes, or rhonchi. Chest is nontender. Heart has regular rate and rhythm without murmur. Abdomen is soft, flat, nontender. Extremities have 1+ edema, full range of motion is present. Skin is warm and dry without rash. Neurologic: Mental status is normal, cranial nerves are intact, moves all extremities equally.  ED Results / Procedures / Treatments   Labs (all labs ordered are listed, but only abnormal results are displayed) Labs Reviewed  BASIC METABOLIC PANEL - Abnormal; Notable for the following components:      Result Value   Potassium 3.3 (*)    CO2 19 (*)    Glucose, Bld 145 (*)    BUN 5 (*)    Creatinine, Ser 1.09 (*)    Calcium 8.8 (*)    GFR, Estimated 60 (*)    Anion gap 17 (*)    All other components within  normal limits  CBC WITH DIFFERENTIAL/PLATELET - Abnormal; Notable for the following components:   WBC 12.4 (*)    Lymphs Abs 4.5 (*)    All other components within normal limits  BRAIN NATRIURETIC PEPTIDE  MAGNESIUM  TROPONIN I (HIGH SENSITIVITY)    EKG EKG Interpretation Date/Time:  Wednesday January 23 2023 00:35:55 EDT Ventricular Rate:  105 PR Interval:  137 QRS Duration:  95 QT Interval:  371 QTC Calculation: 491 R Axis:   25  Text Interpretation: Sinus tachycardia Minimal ST depression, inferior leads Borderline prolonged QT interval When compared with ECG of 01/22/2023, No significant change was found Confirmed by Dione Booze ([id number]) on 01/23/2023 12:37:58 AM  Radiology DG Chest Port 1 View  Result Date: 01/22/2023 CLINICAL DATA:  Cough and shortness of breath EXAM: PORTABLE CHEST 1 VIEW COMPARISON:  07/17/2022 FINDINGS: Heart is enlarged with diffuse bilateral vascular and increased interstitial opacities favored to be edema related to CHF. Difficult to exclude superimposed pneumonia. No large effusion or pneumothorax. Trachea midline. Aorta atherosclerotic. IMPRESSION: Cardiomegaly with vascular congestion and interstitial edema pattern. Electronically Signed   By: Judie Petit.  Shick M.D.   On: 01/22/2023 19:02    Procedures Procedures  Cardiac monitor shows sinus tachycardia, per my interpretation.  Medications Ordered in ED Medications  potassium chloride SA (KLOR-CON M) CR tablet 40 mEq (has no administration in time range)  ipratropium-albuterol (DUONEB) 0.5-2.5 (3) MG/3ML nebulizer solution 3 mL (3 mLs Nebulization Given 01/23/23 0100)  furosemide (LASIX) injection 40 mg (40 mg Intravenous Given 01/23/23 0100)  ondansetron (ZOFRAN) injection 4 mg (4 mg Intravenous Given 01/23/23 0100)  methylPREDNISolone sodium succinate (SOLU-MEDROL) 40 mg/mL injection 40 mg (40 mg Intravenous Given 01/23/23 0320)  diphenhydrAMINE (BENADRYL) injection 50 mg (50 mg Intravenous Given 01/23/23 6045)     ED Course/ Medical Decision Making/ A&P                                 Medical Decision Making Amount and/or Complexity of Data Reviewed Labs: ordered. Radiology: ordered.  Risk Prescription drug management.   Recurrent dyspnea with evidence of bronchospasm.  However, I am also concerned about heart failure since she has significant edema and has not been able to take her furosemide.  I have reviewed her past records and she was seen in the emergency department with diagnosis of pneumonia, but x-ray actually looked looks more like heart  failure with cardiomegaly and vascular congestion and interstitial edema.  Official report does state difficult to exclude superimposed pneumonia, but on reviewing the image, I feel it is much more consistent with heart failure.  I also note that she was hypokalemic and had received a single dose of potassium at her prior visit.  PCR tests were negative for influenza, COVID-19, strep.  I have ordered laboratory workup of CBC, basic metabolic panel, magnesium and I have ordered ondansetron for nausea.  I have ordered a nebulizer treatment with albuterol and ipratropium.  I am also ordering troponin and BNP to evaluate for heart failure.  I have ordered a dose of furosemide.  I have reviewed and interpreted her electrocardiogram and my interpretation is sinus tachycardia with borderline repolarization abnormalities unchanged from prior ECGs.  She has had reasonable diuresis following furosemide.  I have reviewed and interpreted her laboratory test, my interpretation is mild hypokalemia, minimal elevation of creatinine which is not felt to be clinically significant, mild leukocytosis, normal troponin and BNP.  On reevaluation, lungs are now completely clear.  She appears less dyspneic but still uncomfortable.  She describes a full feeling if she tries to lay on her right or her left side.  I have ordered CT angiogram to rule out pulmonary embolism.  She has history  of contrast allergy so I have ordered diphenhydramine and methylprednisolone per protocol.  She has had good diuresis, and is feeling better.  CT angiogram is still pending.  I am signing the case out to Dr. Maple Hudson.  If CT shows no acute process, I anticipate discharging her with prescriptions for ondansetron oral dissolving tablet, furosemide, albuterol inhaler.  Final Clinical Impression(s) / ED Diagnoses Final diagnoses:  SOB (shortness of breath)  Hypokalemia  Nausea and vomiting, unspecified vomiting type    Rx / DC Orders ED Discharge Orders     None         Dione Booze, MD 01/23/23 1324    Dione Booze, MD 01/23/23 [phone]

## 2023-01-23 NOTE — ED Provider Notes (Signed)
Received signout from overnight team disposition pending CTA.  Patient presented with shortness of breath, chest discomfort, generalized malaise x 2 weeks with some nausea vomiting x 4 to 5 days.  Has not taking her home Lasix or any other medications.  Seen yesterday prescribed amoxicillin and doxycycline for possible pneumonia/UTI.  Returned today secondary to her shortness of breath.   Physical Exam  BP 116/84 (BP Location: Right Arm)   Pulse (!) 103   Temp 98 F (36.7 C)   Resp 16   Ht 5\' 9"  (1.753 m)   Wt (!) 168 kg   SpO2 98%   BMI 54.69 kg/m   Physical Exam Vitals and nursing note reviewed.  Constitutional:      General: She is not in acute distress.    Appearance: She is not toxic-appearing.  HENT:     Head: Normocephalic.  Cardiovascular:     Rate and Rhythm: Normal rate and regular rhythm.  Pulmonary:     Effort: Tachypnea and accessory muscle usage present.     Breath sounds: Normal breath sounds.     Comments: Patient sitting upright in bed, some minor respiratory distress/labored breathing.  Lungs largely clear to auscultation, but limited by patient's body habitus.  She was placed on oxygen, 3 L.  I turned off the oxygen and she desatted down to 89% with a good waveform.  Placed back on oxygen with improvement to the mid 90s. Musculoskeletal:        General: Normal range of motion.     Cervical back: Normal range of motion.  Skin:    General: Skin is warm and dry.  Neurological:     General: No focal deficit present.  Psychiatric:        Mood and Affect: Mood normal.        Behavior: Behavior normal.     Procedures  Procedures  ED Course / MDM    Medical Decision Making 57 year old female with complicated past medical history presenting with multiple complaints.  She is hypoxic, currently on 3 L nasal cannula with saturation in the mid 90s.  CTA without pneumonia, no overt PE however was limited study.  Did have some evidence of pulmonary hypertension.   DuoNeb ordered to help with breathing.  Patient without hypotension, rash to suggest contrast allergy.  Labs and workup showed a mild hypokalemia.  Mild leukocytosis.  No sign of systemic infection although she is still having some pain on her back where she had a boil lanced several months ago.  No erythema or induration or fluctuance to suggest recurrence.  Troponin negative, EKG negative.  ACS unlikely.  BNP not significantly elevated.  Will admit him for acute hypoxic respiratory failure.  Amount and/or Complexity of Data Reviewed External Data Reviewed: notes.    Details: Per chart review normal CMP yesterday.  Flu/COVID-negative.  No lipase; added on. Labs: ordered. Radiology: ordered.  Risk Prescription drug management. Decision regarding hospitalization.        Coral Spikes, DO 01/23/23 1046

## 2023-01-23 NOTE — ED Triage Notes (Signed)
Pt BIB [facility]. Pt was headed home  from the hospital and had sudden onset of SOB and increased pain at back pain around incision. Pt states "feels like a weight is in the incision causing pressure." Pt also states her ribs are sore and she can't take full breath.

## 2023-01-23 NOTE — ED Notes (Signed)
Administered IV Benadryl per order via Dr. Preston Fleeting

## 2023-01-23 NOTE — ED Notes (Signed)
Pt notified this RN of a pressure in the Left Chest/back area radiating to her neck. Dr. Preston Fleeting made aware.

## 2023-01-24 ENCOUNTER — Other Ambulatory Visit (HOSPITAL_COMMUNITY): Payer: Self-pay

## 2023-01-24 DIAGNOSIS — J441 Chronic obstructive pulmonary disease with (acute) exacerbation: Principal | ICD-10-CM

## 2023-01-24 DIAGNOSIS — F17210 Nicotine dependence, cigarettes, uncomplicated: Secondary | ICD-10-CM | POA: Diagnosis not present

## 2023-01-24 LAB — LACTIC ACID, PLASMA
Lactic Acid, Venous: 2.4 mmol/L (ref 0.5–1.9)
Lactic Acid, Venous: 3.1 mmol/L (ref 0.5–1.9)

## 2023-01-24 LAB — MAGNESIUM: Magnesium: 2.2 mg/dL (ref 1.7–2.4)

## 2023-01-24 MED ORDER — FUROSEMIDE 40 MG PO TABS
40.0000 mg | ORAL_TABLET | Freq: Every day | ORAL | Status: DC
  Administered 2023-01-24: 40 mg via ORAL
  Filled 2023-01-24: qty 1

## 2023-01-24 MED ORDER — LACTATED RINGERS IV BOLUS
500.0000 mL | Freq: Once | INTRAVENOUS | Status: AC
  Administered 2023-01-24: 500 mL via INTRAVENOUS

## 2023-01-24 MED ORDER — UMECLIDINIUM BROMIDE 62.5 MCG/ACT IN AEPB
1.0000 | INHALATION_SPRAY | Freq: Every day | RESPIRATORY_TRACT | Status: DC
  Administered 2023-01-24: 1 via RESPIRATORY_TRACT
  Filled 2023-01-24: qty 7

## 2023-01-24 MED ORDER — MELOXICAM 15 MG PO TABS
15.0000 mg | ORAL_TABLET | Freq: Every day | ORAL | 0 refills | Status: AC | PRN
  Filled 2023-01-24: qty 30, 30d supply, fill #0

## 2023-01-24 MED ORDER — AMOXICILLIN-POT CLAVULANATE 875-125 MG PO TABS
1.0000 | ORAL_TABLET | Freq: Two times a day (BID) | ORAL | Status: DC
  Administered 2023-01-24: 1 via ORAL
  Filled 2023-01-24: qty 1

## 2023-01-24 MED ORDER — AMOXICILLIN-POT CLAVULANATE 875-125 MG PO TABS
1.0000 | ORAL_TABLET | Freq: Two times a day (BID) | ORAL | 0 refills | Status: DC
  Filled 2023-01-24: qty 6, 3d supply, fill #0

## 2023-01-24 MED ORDER — TRELEGY ELLIPTA 200-62.5-25 MCG/ACT IN AEPB
1.0000 | INHALATION_SPRAY | Freq: Every day | RESPIRATORY_TRACT | 1 refills | Status: DC
  Filled 2023-01-24: qty 60, 30d supply, fill #0

## 2023-01-24 MED ORDER — MOMETASONE FURO-FORMOTEROL FUM 200-5 MCG/ACT IN AERO
2.0000 | INHALATION_SPRAY | Freq: Two times a day (BID) | RESPIRATORY_TRACT | Status: DC
  Administered 2023-01-24: 2 via RESPIRATORY_TRACT
  Filled 2023-01-24: qty 8.8

## 2023-01-24 MED ORDER — PREDNISONE 20 MG PO TABS
40.0000 mg | ORAL_TABLET | Freq: Every day | ORAL | 0 refills | Status: AC
  Filled 2023-01-24: qty 6, 3d supply, fill #0

## 2023-01-24 NOTE — Evaluation (Signed)
Occupational Therapy Evaluation Patient Details Name: Brianna Roberts MRN: [id number] DOB: [birth year] Today's Date: 01/24/2023   History of Present Illness The pt is a 57 yo female presenting 7/31 with SOB x3 days with productive cough, came to ED on 7/30 as well where she was dx with PNA/UTI and d/c. Pt found ot have COPD exacerbation with hypoxia, PMH includes: COPD, tobacco use, fibromyalgia, obesity, arthritis, anxiety, HTN, TIA, thyroid disease, and HIV.   Clinical Impression   Pt is s/p above diagnosis. Pt c/o SOB and fatigue. Pt lives at home alone, 3 steps to enter home, 13 steps to get upstairs to bed/bath, no bed/bath on first floor. Pt likely at baseline, states she needs help with socks/shoes at baseline, hasn't had energy to change bed sheets in a while, difficulty with bathing/dressing without frequent rest breaks, some days does not leave upstairs room due to fatigue. Pt received HHPT/OT but was recently [facility] from [facility], would greatly benefit from continued acute OT and HHOT referral to improve activity tolerance at home, instruct on compensatory strategies with ADLs, and ensure home environment is set up in a safe, accommodating manner to ensure maximal independence/safety.      Recommendations for follow up therapy are one component of a multi-disciplinary discharge planning process, led by the attending physician.  Recommendations may be updated based on patient status, additional functional criteria and insurance authorization.   Assistance Recommended at Discharge Set up Supervision/Assistance  Patient can return home with the following A little help with walking and/or transfers;A little help with bathing/dressing/bathroom;Assist for transportation;Help with stairs or ramp for entrance;Assistance with cooking/housework    Functional Status Assessment  Patient has had a recent decline in their functional status and demonstrates the ability to make significant improvements in  function in a reasonable and predictable amount of time.  Equipment Recommendations  Other (comment) (Pt asked for quote on rollator)    Recommendations for Other Services       Precautions / Restrictions Precautions Precautions: Fall Restrictions Weight Bearing Restrictions: No      Mobility Bed Mobility Overal bed mobility: Modified Independent                  Transfers Overall transfer level: Needs assistance Equipment used: Rolling walker (2 wheels) Transfers: Sit to/from Stand Sit to Stand: Supervision           General transfer comment: supervision, no LOB, able to ambulate to bathroom with RW      Balance Overall balance assessment: Needs assistance Sitting-balance support: No upper extremity supported Sitting balance-Leahy Scale: Good Sitting balance - Comments: EOB   Standing balance support: Bilateral upper extremity supported, During functional activity Standing balance-Leahy Scale: Fair Standing balance comment: RW for support, no LOB during session, supervision for ambulation to bathroom, standing at sink grooming unsupported, no LOB.                           ADL either performed or assessed with clinical judgement   ADL Overall ADL's : Needs assistance/impaired;At baseline Eating/Feeding: Independent   Grooming: Modified independent;Sitting   Upper Body Bathing: Supervision/ safety;Sitting   Lower Body Bathing: Minimal assistance;Sitting/lateral leans   Upper Body Dressing : Modified independent;Sitting   Lower Body Dressing: Minimal assistance;Sit to/from stand   Toilet Transfer: Rolling walker (2 wheels);Supervision/safety   Toileting- Architect and Hygiene: Modified independent;Sit to/from stand   Tub/ Shower Transfer: Rolling walker (2 wheels);Supervision/safety   Functional mobility  during ADLs: Supervision/safety General ADL Comments: displays overall good ability to complete UB tasks, transfers,  severe fatigue/labored breathing, frequent breaks. Pt at baseline not able to don/doff socks, does not like using sock aides, wears slippers.     Vision Baseline Vision/History: 1 Wears glasses Ability to See in Adequate Light: 0 Adequate Patient Visual Report: No change from baseline       Perception     Praxis      Pertinent Vitals/Pain Pain Assessment Pain Assessment: Faces Faces Pain Scale: Hurts a little bit Pain Location: back Pain Descriptors / Indicators: Discomfort Pain Intervention(s): Monitored during session     Hand Dominance Right   Extremity/Trunk Assessment Upper Extremity Assessment Upper Extremity Assessment: Generalized weakness   Lower Extremity Assessment Lower Extremity Assessment: Defer to PT evaluation       Communication Communication Communication: No difficulties   Cognition Arousal/Alertness: Awake/alert Behavior During Therapy: WFL for tasks assessed/performed Overall Cognitive Status: Within Functional Limits for tasks assessed                                       General Comments       Exercises     Shoulder Instructions      Home Living Family/patient expects to be discharged to:: Private residence Living Arrangements: Alone   Type of Home: House Home Access: Stairs to enter Secretary/administrator of Steps: 3 Entrance Stairs-Rails: None Home Layout: Two level Alternate Level Stairs-Number of Steps: 13 Alternate Level Stairs-Rails: Right (halfway) Bathroom Shower/Tub: Chief Strategy Officer: Standard Bathroom Accessibility: No   Home Equipment: Rollator (4 wheels);BSC/3in1;Shower seat;Grab bars - tub/shower;Tub bench (tub bench doesnt fit)   Additional Comments: had HH but has been d/c      Prior Functioning/Environment Prior Level of Function : Needs assist;Driving             Mobility Comments: limited in home, difficulty with stairs. HHPT said they were not safe but nothing they  could do to fix. pt denies falls ADLs Comments: pt reports significant limitations due to poor endurance. uses scooter in store        OT Problem List: Decreased strength;Decreased range of motion;Decreased activity tolerance;Impaired balance (sitting and/or standing);Impaired UE functional use      OT Treatment/Interventions: Self-care/ADL training;Therapeutic exercise;Energy conservation;DME and/or AE instruction;Therapeutic activities    OT Goals(Current goals can be found in the care plan section) Acute Rehab OT Goals Patient Stated Goal: to improve activity tolerance OT Goal Formulation: With patient Time For Goal Achievement: 02/07/23 Potential to Achieve Goals: Good  OT Frequency: Min 1X/week    Co-evaluation              AM-PAC OT "6 Clicks" Daily Activity     Outcome Measure Help from another person eating meals?: None Help from another person taking care of personal grooming?: A Little Help from another person toileting, which includes using toliet, bedpan, or urinal?: A Little Help from another person bathing (including washing, rinsing, drying)?: A Little Help from another person to put on and taking off regular upper body clothing?: A Little Help from another person to put on and taking off regular lower body clothing?: A Little 6 Click Score: 19   End of Session Equipment Utilized During Treatment: Rolling walker (2 wheels) Nurse Communication: Mobility status  Activity Tolerance: Patient tolerated treatment well Patient left: in bed;with call bell/phone within  reach;with nursing/sitter in room  OT Visit Diagnosis: Other abnormalities of gait and mobility (R26.89);Unsteadiness on feet (R26.81);Muscle weakness (generalized) (M62.81)                Time: 1191-4782 OT Time Calculation (min): 28 min Charges:  OT General Charges $OT Visit: 1 Visit OT Evaluation $OT Eval Low Complexity: 1 Low OT Treatments $Self Care/Home Management : 8-22 mins  [address] OTR/L   Alexis Goodell 01/24/2023, 1:48 PM

## 2023-01-24 NOTE — Progress Notes (Signed)
    Durable Medical Equipment  (From admission, onward)           Start     Ordered   01/24/23 1541  For home use only DME Walker rolling  Once       Comments: Can this be a bariatric rolling walker please  Question Answer Comment  Walker: With 5 Inch Wheels   Patient needs a walker to treat with the following condition Physical deconditioning      01/24/23 1543   01/24/23 1541  For home use only DME Shower stool  Once       Comments: Bariatric shower stool   01/24/23 1543

## 2023-01-24 NOTE — Plan of Care (Signed)
  Problem: Education: Goal: Knowledge of General Education information will improve Description: Including pain rating scale, medication(s)/side effects and non-pharmacologic comfort measures 01/24/2023 1704 by Milderd Meager, RN Outcome: Adequate for Discharge 01/24/2023 1238 by Milderd Meager, RN Outcome: Progressing   Problem: Health Behavior/Discharge Planning: Goal: Ability to manage health-related needs will improve Outcome: Adequate for Discharge   Problem: Clinical Measurements: Goal: Ability to maintain clinical measurements within normal limits will improve 01/24/2023 1704 by Milderd Meager, RN Outcome: Adequate for Discharge 01/24/2023 1238 by Milderd Meager, RN Outcome: Progressing Goal: Will remain free from infection Outcome: Adequate for Discharge Goal: Diagnostic test results will improve Outcome: Adequate for Discharge Goal: Respiratory complications will improve 01/24/2023 1704 by Milderd Meager, RN Outcome: Adequate for Discharge 01/24/2023 1238 by Milderd Meager, RN Outcome: Progressing Goal: Cardiovascular complication will be avoided Outcome: Adequate for Discharge   Problem: Activity: Goal: Risk for activity intolerance will decrease Outcome: Adequate for Discharge   Problem: Nutrition: Goal: Adequate nutrition will be maintained Outcome: Adequate for Discharge   Problem: Coping: Goal: Level of anxiety will decrease Outcome: Adequate for Discharge   Problem: Elimination: Goal: Will not experience complications related to bowel motility Outcome: Adequate for Discharge Goal: Will not experience complications related to urinary retention Outcome: Adequate for Discharge   Problem: Pain Managment: Goal: General experience of comfort will improve Outcome: Adequate for Discharge   Problem: Safety: Goal: Ability to remain free from injury will improve Outcome: Adequate for Discharge   Problem: Skin Integrity: Goal: Risk for impaired skin integrity  will decrease Outcome: Adequate for Discharge

## 2023-01-24 NOTE — Plan of Care (Signed)
  Problem: Education: Goal: Knowledge of General Education information will improve Description: Including pain rating scale, medication(s)/side effects and non-pharmacologic comfort measures Outcome: Progressing   Problem: Clinical Measurements: Goal: Ability to maintain clinical measurements within normal limits will improve Outcome: Progressing Goal: Respiratory complications will improve Outcome: Progressing   

## 2023-01-24 NOTE — Discharge Instructions (Addendum)
Ms. Brianna Roberts, It was a pleasure taking care of you at [facility]. You were admitted for COPD exacerbation, treated with steroids and antibiotics.  We are discharging you home now that you are doing better. Please follow the following instructions.   1) Regarding your suspected COPD, we think you had an exacerbation.  We are just discharging you on prednisone.  Please continue taking your prednisone which we will continue for 3 more days.  We also changed your inhaler to a new inhaler called Trelegy.  Stop taking your old inhalers, and start taking your new inhaler which is Trelegy. You can continue your rescue inhaler when needed, but do not use your Symbicort as Trelegy will replace this.   2) You have an appointment with the internal medicine clinic on 02/07/2023.  This clinic on the ground-floor of this hospital.  Please show up to the appointment at 2:45 PM. This appointment will be with  Dr. Hessie Diener.    3) We will send you home with home health RN, home health physical therapy, and home health occupational therapy. We will also make sure that you have a walker at home. This should help you get along well at home. Please take caution when getting up and down and take time with moving. Please allow yourself time to catch your breath.   4) Regarding antibiotics, we think that you might have a pneumonia and we will send you home with Augmentin. Please take as directed. Do not miss any doses.  5).  Regarding your other concerns, please continue taking your other home medications as prescribed, if you do develop any further shortness of breath, excruciating chest pain that does not resolve, please return back to the emergency department.  Take care,  Dr. Kathleen Lime, MD

## 2023-01-24 NOTE — TOC Transition Note (Signed)
Transition of Care [facility]) - CM/SW Discharge Note   Patient Details  Name: Brianna Roberts MRN: [id number] Date of Birth: 23-Apr-1966  [facility] CM/SW Contact:  Epifanio Lesches, RN Phone Number: 01/24/2023, 4:01 PM   Clinical Narrative:    Patient will DC to: home Anticipated DC date: 01/24/2023 Transport by: taxi   Per MD patient ready for DC today . RN, patient, patient's family, and [facility]  notified of DC. Pt agreeable to home health services. Pt's preference: [facility]. Referral made with [facility] and accepted. DME referral mad with [employer] for RW and showerchair. Equipment will be delivered to pt's home. Pt without RX med concerns. Pt without transportation to home, no money.  RNCM will sign off for now as intervention is no longer needed. Please consult [place] again if new needs arise.    Final next level of care: Home w Home Health Services Barriers to Discharge: No Barriers Identified   Patient Goals and CMS Choice   Choice offered to / list presented to : Patient  Discharge Placement                         Discharge Plan and Services Additional resources added to the After Visit Summary for                  DME Arranged: Other see comment (shower chair) DME Agency: [employer] Date DME Agency Contacted: 01/24/23 Time DME Agency Contacted: [phone] Representative spoke with at DME Agency: Ian Malkin HH Arranged: RN, PT, OT, Nurse's Aide, Social Work [employer] Agency: [facility] Date HH Agency Contacted: 01/24/23 Time HH Agency Contacted: [phone] Representative spoke with at [facility] Agency: Haywood Lasso  Social Determinants of Health (SDOH) Interventions SDOH Screenings   Food Insecurity: Food Insecurity Present (11/07/2022)   Received from [facility], [facility]  Transportation Needs: Unmet Transportation Needs (11/07/2022)   Received from [facility]  Utilities: At Risk (11/07/2022)   Received from [facility], [facility]  Financial Resource Strain: High Risk (10/04/2022)   Received from [facility], [facility]  Physical Activity: Inactive (10/04/2022)   Received from [facility], [facility]  Social Connections: Socially Integrated (10/04/2022)   Received from [facility], [facility]  Stress: Stress Concern Present (11/08/2022)   Received from [facility], [facility]  Tobacco Use: High Risk (01/22/2023)     Readmission Risk Interventions     No data to display

## 2023-01-24 NOTE — Evaluation (Signed)
Physical Therapy Evaluation Patient Details Name: Brianna Roberts MRN: [id number] DOB: 1965/12/06 Today's Date: 01/24/2023  History of Present Illness  The pt is a 57 yo female presenting 7/31 with SOB x3 days with productive cough, came to ED on 7/30 as well where she was dx with PNA/UTI and d/c. Pt found ot have COPD exacerbation with hypoxia, PMH includes: COPD, tobacco use, fibromyalgia, obesity, arthritis, anxiety, HTN, TIA, thyroid disease, and HIV.   Clinical Impression  Pt in bed upon arrival of PT, agreeable to evaluation at this time. Prior to admission the pt was mobilizing without DME in her home, but was significantly limited by poor endurance with all mobility and self-care. The pt now presents with limitations in functional mobility, power, strength, stability, and activity tolerance due to above dx, and will continue to benefit from skilled PT to address these deficits. The pt needed supervision for most transfers but was limited to ~10 ft mobility prior to needing seated rest due to fatigue. Pt with SpO2 94-97% on RA with all activity but did have 3/4 DOE. Will benefit from max HHPT services to improve safety with mobility in the home and progress functional endurance for self-care and stair navigation as the pt has a flight of stairs to reach her bedroom/bathroom.          If plan is discharge home, recommend the following: A little help with walking and/or transfers;A little help with bathing/dressing/bathroom;Help with stairs or ramp for entrance;Assistance with cooking/housework   Can travel by private vehicle        Equipment Recommendations Rolling walker (2 wheels) (bariatric)  Recommendations for Other Services       Functional Status Assessment Patient has had a recent decline in their functional status and demonstrates the ability to make significant improvements in function in a reasonable and predictable amount of time.     Precautions / Restrictions  Precautions Precautions: Fall Restrictions Weight Bearing Restrictions: No      Mobility  Bed Mobility Overal bed mobility: Modified Independent             General bed mobility comments: sitting EOB upon arrival, uses HOB elevated and increased time    Transfers Overall transfer level: Needs assistance Equipment used: Rolling walker (2 wheels) Transfers: Sit to/from Stand Sit to Stand: Min guard, Supervision           General transfer comment: pt at times completing without assist, increased time and effort. single LOB with turning using RW in the room, needed minA to steady    Ambulation/Gait Ambulation/Gait assistance: Min guard Gait Distance (Feet): 10 Feet Assistive device: Rolling walker (2 wheels) Gait Pattern/deviations: Step-to pattern, Decreased stride length, Trunk flexed Gait velocity: decreased Gait velocity interpretation: <1.31 ft/sec, indicative of household ambulator   General Gait Details: pt with small shuffling steps with trunk flexed and dependence on RW. SOB with exertion but SpO2 stable on RA     Balance Overall balance assessment: Needs assistance Sitting-balance support: No upper extremity supported Sitting balance-Leahy Scale: Good     Standing balance support: Bilateral upper extremity supported, During functional activity Standing balance-Leahy Scale: Poor Standing balance comment: BUE support on RW for gait or on counter for ADLs                             Pertinent Vitals/Pain Pain Assessment Pain Assessment: Faces Faces Pain Scale: Hurts a little bit Pain Location: back Pain Descriptors /  Indicators: Discomfort Pain Intervention(s): Limited activity within patient's tolerance, Monitored during session, Repositioned    Home Living Family/patient expects to be discharged to:: Private residence Living Arrangements: Alone   Type of Home: House Home Access: Stairs to enter Entrance Stairs-Rails: None Entrance  Stairs-Number of Steps: 3 Alternate Level Stairs-Number of Steps: 13 Home Layout: Two level Home Equipment: Rollator (4 wheels);BSC/3in1;Shower seat;Grab bars - tub/shower;Tub bench (tub bench doesnt fit) Additional Comments: had HH but has been d/c    Prior Function Prior Level of Function : Needs assist;Driving             Mobility Comments: limited in home, difficulty with stairs. HHPT said they were not safe but nothing they could do to fix. pt denies falls ADLs Comments: pt reports significant limitations due to poor endurance. uses scooter in store     Hand Dominance   Dominant Hand: Right    Extremity/Trunk Assessment   Upper Extremity Assessment Upper Extremity Assessment: Generalized weakness    Lower Extremity Assessment Lower Extremity Assessment: Generalized weakness    Cervical / Trunk Assessment Cervical / Trunk Assessment: Other exceptions Cervical / Trunk Exceptions: large body habitus  Communication   Communication: No difficulties  Cognition Arousal/Alertness: Awake/alert Behavior During Therapy: WFL for tasks assessed/performed Overall Cognitive Status: Within Functional Limits for tasks assessed                                          General Comments General comments (skin integrity, edema, etc.): HR and SpO2 stable (95-97% on RA at rest and with short bout of gait) but pt with 3/4 DOE, even out of breath with continued conversation        Assessment/Plan    PT Assessment Patient needs continued PT services  PT Problem List Cardiopulmonary status limiting activity;Obesity;Decreased activity tolerance;Decreased balance;Decreased mobility       PT Treatment Interventions Gait training;Stair training;Functional mobility training;Therapeutic activities;Therapeutic exercise;Balance training;Patient/family education    PT Goals (Current goals can be found in the Care Plan section)  Acute Rehab PT Goals Patient Stated Goal:  return home, recover her strength PT Goal Formulation: With patient Time For Goal Achievement: 02/07/23 Potential to Achieve Goals: Good    Frequency Min 1X/week        AM-PAC PT "6 Clicks" Mobility  Outcome Measure Help needed turning from your back to your side while in a flat bed without using bedrails?: None Help needed moving from lying on your back to sitting on the side of a flat bed without using bedrails?: None Help needed moving to and from a bed to a chair (including a wheelchair)?: A Little Help needed standing up from a chair using your arms (e.g., wheelchair or bedside chair)?: A Little Help needed to walk in hospital room?: A Little Help needed climbing 3-5 steps with a railing? : A Lot 6 Click Score: 19    End of Session Equipment Utilized During Treatment: Gait belt Activity Tolerance: Patient limited by fatigue Patient left: in bed;with call bell/phone within reach (MD rounding) Nurse Communication: Mobility status PT Visit Diagnosis: Other abnormalities of gait and mobility (R26.89);Unsteadiness on feet (R26.81);Muscle weakness (generalized) (M62.81)    Time: 1324-4010 PT Time Calculation (min) (ACUTE ONLY): 28 min   Charges:   PT Evaluation $PT Eval Low Complexity: 1 Low PT Treatments $Therapeutic Activity: 8-22 mins PT General Charges $$ ACUTE PT VISIT: 1  Visit         Vickki Muff, PT, DPT   Acute Rehabilitation Department Office [phone] Secure Chat Communication Preferred  Ronnie Derby 01/24/2023, 12:33 PM

## 2023-01-24 NOTE — Discharge Summary (Addendum)
Name: Brianna Roberts MRN: [id number] DOB: 1965/07/10 57 y.o. PCP: Sharmon Revere, MD  Date of Admission: 01/23/2023 12:32 AM Date of Discharge: 01/24/2023 Attending Physician: Dr.  Mercie Eon, MD  Discharge Diagnosis: Principal Problem:   COPD exacerbation [facility])    Discharge Medications: Allergies as of 01/24/2023       Reactions   Hydromorphone Itching   Iodinated Contrast Media Anaphylaxis, Swelling, Other (See Comments)   Patient stated this was a "one off" Patient complained of throat/ uvula swelling s/p injection on 02/01/2017 but states she isnt allergic to the dye and has had several times prior    Sulfa Antibiotics Anaphylaxis   Codeine Other (See Comments)   Patient said mother told her she was allergic as a child; patient states she is not allergic   Nickel Rash   Patient states she is not allergic   Penicillins Rash, Other (See Comments)   Told she was allergic from childhood: patient states she is not allergic   Spironolactone Nausea Only, Other (See Comments)   Caused GI isues   Cefdinir Hives   Adhesive [tape] Rash        Medication List     STOP taking these medications    amoxicillin 500 MG capsule Commonly known as: AMOXIL   budesonide-formoterol 160-4.5 MCG/ACT inhaler Commonly known as: SYMBICORT   clindamycin 150 MG capsule Commonly known as: CLEOCIN   dexamethasone 2 MG tablet Commonly known as: DECADRON   doxycycline 100 MG capsule Commonly known as: VIBRAMYCIN   erythromycin ophthalmic ointment   fluconazole 150 MG tablet Commonly known as: DIFLUCAN   ipratropium-albuterol 0.5-2.5 (3) MG/3ML Soln Commonly known as: DUONEB   levofloxacin 750 MG tablet Commonly known as: LEVAQUIN   oxyCODONE 5 MG immediate release tablet Commonly known as: Oxy IR/ROXICODONE   valACYclovir 1000 MG tablet Commonly known as: VALTREX       TAKE these medications    acetaminophen 325 MG tablet Commonly known as: TYLENOL Take 650 mg by  mouth every 6 (six) hours as needed (for pain).   albuterol 108 (90 Base) MCG/ACT inhaler Commonly known as: VENTOLIN HFA Inhale 1-2 puffs into the lungs every 6 (six) hours as needed for wheezing or shortness of breath.   amitriptyline 100 MG tablet Commonly known as: ELAVIL Take 100 mg by mouth at bedtime.   amLODipine 10 MG tablet Commonly known as: NORVASC Take 1 tablet (10 mg total) by mouth daily.   amoxicillin-clavulanate 875-125 MG tablet Commonly known as: AUGMENTIN Take 1 tablet by mouth every 12 (twelve) hours for 3 days. What changed: when to take this   ASPERCREME LIDOCAINE EX Apply 1 application topically 2 (two) times daily as needed (for muscle pain).   azelastine 0.1 % nasal spray Commonly known as: ASTELIN USE 2 SPRAYS EACH NOSTRIL TWICE DAILY. What changed:  how much to take how to take this when to take this additional instructions   clobetasol ointment 0.05 % Commonly known as: TEMOVATE Apply 1 application topically as needed (for [place]).   diazepam 5 MG tablet Commonly known as: VALIUM Take 1 tablet (5 mg total) by mouth every 8 (eight) hours as needed for anxiety. What changed: when to take this   fluticasone 50 MCG/ACT nasal spray Commonly known as: FLONASE USE 1 SPRAY IN EACH NOSTRIL DAILY.   furosemide 40 MG tablet Commonly known as: LASIX Take 1 tablet (40 mg total) by mouth daily. What changed: when to take this   glucose blood test strip  Use as instructed   hydrOXYzine 10 MG tablet Commonly known as: ATARAX Take 1 tablet (10 mg total) by mouth at bedtime. What changed: when to take this   Lancets Thin Misc Use to check home glucose daily   levothyroxine 50 MCG tablet Commonly known as: SYNTHROID Take 1 tablet (50 mcg total) by mouth daily before breakfast.   meloxicam 15 MG tablet Commonly known as: MOBIC Take 1 tablet (15 mg total) by mouth daily as needed (moderate pain). What changed:  how much to take how to  take this when to take this reasons to take this additional instructions   metoprolol tartrate 100 MG tablet Commonly known as: LOPRESSOR Take 1 tablet (100 mg total) by mouth 2 (two) times daily.   nystatin cream Commonly known as: MYCOSTATIN Apply 1 application topically 2 (two) times daily.   ondansetron 8 MG disintegrating tablet Commonly known as: ZOFRAN-ODT Take 1 tablet (8 mg total) by mouth every 8 (eight) hours as needed for nausea or vomiting.   potassium chloride SA 20 MEQ tablet Commonly known as: KLOR-CON M Take 1 tablet (20 mEq total) by mouth 2 (two) times daily.   predniSONE 20 MG tablet Commonly known as: DELTASONE Take 2 tablets (40 mg total) by mouth daily with breakfast for 3 days. Start taking on: January 25, 2023   Prevail Washcloths Misc Use daily as directed   Pro Comfort Tens Unit [place] 1 Device by Does not apply route daily as needed.   rosuvastatin 5 MG tablet Commonly known as: CRESTOR Take 5 mg by mouth daily.   Trelegy Ellipta 200-62.5-25 MCG/ACT Aepb Generic drug: Fluticasone-Umeclidin-Vilant Inhale 1 Inhalation into the lungs daily at 6 (six) AM.   triamcinolone ointment 0.5 % Commonly known as: KENALOG Apply 1 application topically daily.   vitamin B-12 100 MCG tablet Commonly known as: CYANOCOBALAMIN Take 100 mcg by mouth once a week.   Vitamin D (Ergocalciferol) 1.25 MG (50000 UNIT) Caps capsule Commonly known as: DRISDOL Take 50,000 Units by mouth once a week. On Saturday               Durable Medical Equipment  (From admission, onward)           Start     Ordered   01/24/23 1553  For home use only DME Shower stool  Once       Comments: Bariatric shower chair   01/24/23 1552   01/24/23 1541  For home use only DME Walker rolling  Once       Comments: Can this be a bariatric rolling walker please  Question Answer Comment  Walker: With 5 Inch Wheels   Patient needs a walker to treat with the following condition  Physical deconditioning      01/24/23 1543            Disposition and follow-up:   BriannaGlenisha A Roberts was discharged from [facility] in Stable condition.  At the hospital follow up visit please address:  1.  Follow-up:  a.  SUSPECTED COPD EXACERBATION - Follow up breathing after short course of steroids. Ensure adherence with Trelegy (a new inhaler, she was previously on Symbicort). Refer for pulmonary rehab & formal PFTs.     b. Possible UTI - Patient reported dysuria and cloudy urine. U/A not consistent with UTI, but this was after initial antibiotics were given. Will treat with 5 days of Augmentin.           c. Elevated lactate -  Patient's lactate was 2.6 on admission, came down to 2.2 the next morning. She clinically was 100% better, with stable vitals, good mentation, and no evidence of sepsis or hypoperfusion. Repeat lactate was accidentally sent and was elevated at 3.1, resulting after discharge, but since she appeared so well clinically, we will not readmit her just for this lab. Our team will call her tomorrow to check in and see if we can get her an appointment next week at [facility].    2.  Labs / imaging needed at time of follow-up:  PFT, Pulm rehab, consider BMP  3.  Medication Changes  Abx -  Augmentin End Date: 01/27/2023  Discontinued Symbicort, started Trelegy.  Follow-up Appointments:  Follow-up Information     Faith Rogue, DO Follow up on 02/07/2023.   Specialty: Internal Medicine Why: at 2:45PM Contact information: [address] [place] [address] [phone]         [facility] Follow up.   Specialty: Home Health Services Why: Home health service will be provided by [facility], start of care with 48 hours post discharge Contact information: [address] [place] [address] [phone]                 Hospital Course by problem list:   # COPD exacerbation Patient presented with acute  progressive shortness of breath associated with cough and pleuritic chest pain, with wheezing on her admission exam, consistent with a likely COPD exacerbation. We considered PE, but CTA chest negative for PE. We considered CAP, but chest imaging without focal consolidation. She was not volume overloaded. She was treated with steroids, duonebs, and azithromycin (which was stopped after 1 dose due to prolonged QTC and clinical stability). She was satting 97% on room air at day of discharge. Her baseline lung reserve is very poor - she gets winded walking to the bathroom at baseline. I highly recommend close PCP follow up (she wants to switch to [facility]) and formal PFTs  #Dysuria  Patient reported a history of UTI 2 weeks ago for which she finished a course of antibiotics.  Also reported burning with urination on admission. U/A not consistent with UTI, but was checked after dose of Abx given. Will treat with Augmentin x5 days.  #Elevated lactate Patient's lactate was 2.6 on admission, came down to 2.2 the next morning. She clinically was 100% better, with stable vitals, good mentation, and no evidence of sepsis or hypoperfusion. Repeat lactate was accidentally sent and was elevated at 3.1, resulting after discharge, but since she appeared so well clinically, we will not readmit her just for this lab. Our team will call her tomorrow to check in and see if we can get her an appointment next week at [facility].  #History Of HFpEF History of heart failure with preserved ejection fraction currently being managed with Lasix 40 mg twice daily. No changes were made to this regimen  #Fibromyalgia Has a history of fibromyalgia currently being managed with amitriptyline 100 mg diazepam 5 mg as needed and meloxicam as needed and oxycodone.  No changes to her fibromyalgia regimen was made during this encounter  #Obstructive sleep apnea Uses CPAP at night.We maintained same treatments at night for sleep    Discharge  Subjective: Patient initially presented due to shortness of breath requiring intermittent 4 L of oxygen. At discharge, patient endorses significant improvement, denies any chest pain or any significant shortness of breath at this time.  Patient elected to  go home instead of SNF and work with home health and PT/OT at home.   Discharge Exam:   BP 111/68 (BP Location: Left Arm)   Pulse 88   Temp 98.5 F (36.9 C) (Oral)   Resp 17   Ht 5\' 9"  (1.753 m)   Wt (!) 168 kg   SpO2 98%   BMI 54.69 kg/m   Constitutional: Obese appearing female sitting in bed , answering appropriately to questions  HENT: normocephalic atraumatic, mucous membranes moist Cardiovascular: Mild crackles appreciated on auscultation Pulmonary/Chest: Wheezing present Psych: Mildy anxious but otherwise normal mood and affect  Pertinent Labs, Studies, and Procedures:     Latest Ref Rng & Units 01/24/2023    2:33 AM 01/23/2023   12:50 AM 01/22/2023    6:09 PM  CBC  WBC 4.0 - 10.5 K/uL 21.4  12.4  11.8   Hemoglobin 12.0 - 15.0 g/dL 09.8  11.9  14.7   Hematocrit 36.0 - 46.0 % 39.8  41.8  42.1   Platelets 150 - 400 K/uL 327  358  349        Latest Ref Rng & Units 01/24/2023    2:33 AM 01/23/2023    6:42 PM 01/23/2023   12:50 AM  CMP  Glucose 70 - 99 mg/dL 829  562  130   BUN 6 - 20 mg/dL 11  9  5    Creatinine 0.44 - 1.00 mg/dL 8.65  7.84  6.96   Sodium 135 - 145 mmol/L 135  134  136   Potassium 3.5 - 5.1 mmol/L 5.0  3.8  3.3   Chloride 98 - 111 mmol/L 103  103  100   CO2 22 - 32 mmol/L 25  21  19    Calcium 8.9 - 10.3 mg/dL 9.1  9.2  8.8     CT Angio Chest PE W and/or Wo Contrast  Result Date: 01/23/2023 CLINICAL DATA:  Sudden onset shortness of breath. EXAM: CT ANGIOGRAPHY CHEST WITH CONTRAST TECHNIQUE: Multidetector CT imaging of the chest was performed using the standard protocol during bolus administration of intravenous contrast. Multiplanar CT image reconstructions and MIPs were obtained to evaluate the vascular  anatomy. RADIATION DOSE REDUCTION: This exam was performed according to the departmental dose-optimization program which includes automated exposure control, adjustment of the mA and/or kV according to patient size and/or use of iterative reconstruction technique. CONTRAST:  OMNIPAQUE IOHEXOL 350 MG/ML SOLN COMPARISON:  Chest x-ray from yesterday. FINDINGS: Cardiovascular: No central or lobar pulmonary embolism. The segmental and subsegmental pulmonary arteries are poorly evaluated due to contrast bolus timing, patient body habitus, and mixing artifact. Dilated main pulmonary artery measuring up to 3.6 cm in diameter. Normal heart size. No pericardial effusion. No thoracic aortic aneurysm or dissection. Mediastinum/Nodes: No enlarged mediastinal, hilar, or axillary lymph nodes. Thyroid gland, trachea, and esophagus demonstrate no significant findings. Lungs/Pleura: Mild dependent subsegmental atelectasis in both lungs. No focal consolidation, pleural effusion, or pneumothorax. Upper Abdomen: No acute abnormality. Musculoskeletal: No chest wall abnormality. No acute or significant osseous findings. Inflammatory change in the midline back at T6-T7 has significantly improved since April. Review of the MIP images confirms the above findings. IMPRESSION: 1. No central or lobar pulmonary embolism. Non-diagnostic evaluation of the segmental and subsegmental pulmonary arteries, despite repeat attempt. If there is continued clinical concern for pulmonary embolism, consider V/Q scan. 2. Dilated main pulmonary artery, suggestive of pulmonary arterial hypertension. Electronically Signed   By: Obie Dredge M.D.   On: 01/23/2023 09:42  Discharge Instructions: Discharge Instructions     Call MD for:  difficulty breathing, headache or visual disturbances   Complete by: As directed    Call MD for:  extreme fatigue   Complete by: As directed    Call MD for:  persistant dizziness or light-headedness   Complete  by: As directed    Call MD for:  persistant nausea and vomiting   Complete by: As directed    Call MD for:  temperature >100.4   Complete by: As directed    Diet - low sodium heart healthy   Complete by: As directed    Discharge instructions   Complete by: As directed    Ms. Mariem Yetta Barre, It was a pleasure taking care of you at [facility]. You were admitted for COPD exacerbation, treated with steroids and antibiotics.  We are discharging you home now that you are doing better. Please follow the following instructions.   1) Regarding your suspected COPD, we think you had an exacerbation.  We are just discharging you on prednisone.  Please continue taking your prednisone which we will continue for 3 more days.  We also changed your inhaler to a new inhaler called Trelegy.  Stop taking your old inhalers, and start taking your new inhaler which is Trelegy. You can continue your rescue inhaler when needed, but do not use your Symbicort as Trelegy will replace this.   2) You have an appointment with the internal medicine clinic on 02/07/2023.  This clinic on the ground-floor of this hospital.  Please show up to the appointment at 2:45 PM. This appointment will be with  Dr. Hessie Diener.    3) We will send you home with home health RN, home health physical therapy, and home health occupational therapy. We will also make sure that you have a walker at home. This should help you get along well at home. Please take caution when getting up and down and take time with moving. Please allow yourself time to catch your breath.   4) Regarding antibiotics, we think that you might have a pneumonia and we will send you home with Augmentin. Please take as directed. Do not miss any doses.  5).  Regarding your other concerns, please continue taking your other home medications as prescribed, if you do develop any further shortness of breath, excruciating chest pain that does not resolve, please return back to the  emergency department.  Take care,  Dr. [name], DO   Increase activity slowly   Complete by: As directed        Signed: Kathleen Lime, MD Internal Medicine Teaching Service Pager [phone]

## 2023-01-24 NOTE — TOC Benefit Eligibility Note (Signed)
Pharmacy Patient Advocate Encounter  Insurance verification completed.    The patient is insured through [employer] Part D  Ran test claim for Trelegy Ellipta 200-62.5-25 mcg and the current 30 day co-pay is $11.20.  Ran test claim for Breztri 160-9-4.8 mcg and the current 30 day co-pay is $11.20.  This test claim was processed through [facility]- copay amounts may vary at other pharmacies due to pharmacy/plan contracts, or as the patient moves through the different stages of their insurance plan.    Roland Earl, CPHT Pharmacy Patient Advocate Specialist [facility] Patient Advocate Team Direct Number: [phone]  Fax: [phone]

## 2023-01-24 NOTE — Plan of Care (Signed)

## 2023-01-24 NOTE — Progress Notes (Signed)
    Durable Medical Equipment  (From admission, onward)           Start     Ordered   01/24/23 1553  For home use only DME Shower stool  Once       Comments: Bariatric shower chair   01/24/23 1552   01/24/23 1541  For home use only DME Walker rolling  Once       Comments: Can this be a bariatric rolling walker please  Question Answer Comment  Walker: With 5 Inch Wheels   Patient needs a walker to treat with the following condition Physical deconditioning      01/24/23 1543

## 2023-01-25 ENCOUNTER — Other Ambulatory Visit: Payer: Self-pay | Admitting: Student

## 2023-01-25 DIAGNOSIS — J441 Chronic obstructive pulmonary disease with (acute) exacerbation: Secondary | ICD-10-CM | POA: Diagnosis not present

## 2023-01-25 MED ORDER — AMOXICILLIN-POT CLAVULANATE 875-125 MG PO TABS: 1.0000 | ORAL_TABLET | Freq: Two times a day (BID) | ORAL | 0 refills | Status: AC

## 2023-01-25 MED ORDER — ONDANSETRON 8 MG PO TBDP: 8.0000 mg | ORAL_TABLET | Freq: Three times a day (TID) | ORAL | 0 refills | Status: AC | PRN

## 2023-01-25 NOTE — Progress Notes (Signed)
Called the patient to follow up. Patient states she is doing better.  She still is short of breath, but overall feeling better.  She denies any fevers.  She states she otherwise is starting to feel much better.  She does report that she did not get her Augmentin in her bag after she was discharged from the hospital.  She also reports she did not get her Zofran.  Unclear what happened at this point.  Did rewrite her Augmentin and Zofran.  Patient has some confusion on which inhalers to take, and I counseled her to only take Trelegy, stop taking Dulera and Incruse which were given to her in the hospital as Trelegy was not on formulary.  Overall, patient is doing well.  She does not have much complaints.

## 2023-01-26 DIAGNOSIS — J4489 Other specified chronic obstructive pulmonary disease: Secondary | ICD-10-CM | POA: Diagnosis not present

## 2023-01-26 DIAGNOSIS — I110 Hypertensive heart disease with heart failure: Secondary | ICD-10-CM | POA: Diagnosis not present

## 2023-01-26 DIAGNOSIS — R471 Dysarthria and anarthria: Secondary | ICD-10-CM | POA: Diagnosis not present

## 2023-01-26 DIAGNOSIS — G4700 Insomnia, unspecified: Secondary | ICD-10-CM | POA: Diagnosis not present

## 2023-01-26 DIAGNOSIS — B333 Retrovirus infections, not elsewhere classified: Secondary | ICD-10-CM | POA: Diagnosis not present

## 2023-01-26 DIAGNOSIS — F32A Depression, unspecified: Secondary | ICD-10-CM | POA: Diagnosis not present

## 2023-01-26 DIAGNOSIS — J189 Pneumonia, unspecified organism: Secondary | ICD-10-CM | POA: Diagnosis not present

## 2023-01-26 DIAGNOSIS — G4733 Obstructive sleep apnea (adult) (pediatric): Secondary | ICD-10-CM | POA: Diagnosis not present

## 2023-01-26 DIAGNOSIS — E876 Hypokalemia: Secondary | ICD-10-CM | POA: Diagnosis not present

## 2023-01-26 DIAGNOSIS — G9332 Myalgic encephalomyelitis/chronic fatigue syndrome: Secondary | ICD-10-CM | POA: Diagnosis not present

## 2023-01-26 DIAGNOSIS — G43909 Migraine, unspecified, not intractable, without status migrainosus: Secondary | ICD-10-CM | POA: Diagnosis not present

## 2023-01-26 DIAGNOSIS — K589 Irritable bowel syndrome without diarrhea: Secondary | ICD-10-CM | POA: Diagnosis not present

## 2023-01-26 DIAGNOSIS — R7303 Prediabetes: Secondary | ICD-10-CM | POA: Diagnosis not present

## 2023-01-26 DIAGNOSIS — I5032 Chronic diastolic (congestive) heart failure: Secondary | ICD-10-CM | POA: Diagnosis not present

## 2023-01-26 DIAGNOSIS — J440 Chronic obstructive pulmonary disease with acute lower respiratory infection: Secondary | ICD-10-CM | POA: Diagnosis not present

## 2023-01-26 DIAGNOSIS — J309 Allergic rhinitis, unspecified: Secondary | ICD-10-CM | POA: Diagnosis not present

## 2023-01-26 DIAGNOSIS — M5030 Other cervical disc degeneration, unspecified cervical region: Secondary | ICD-10-CM | POA: Diagnosis not present

## 2023-01-26 DIAGNOSIS — D72829 Elevated white blood cell count, unspecified: Secondary | ICD-10-CM | POA: Diagnosis not present

## 2023-01-26 DIAGNOSIS — E039 Hypothyroidism, unspecified: Secondary | ICD-10-CM | POA: Diagnosis not present

## 2023-01-26 DIAGNOSIS — R339 Retention of urine, unspecified: Secondary | ICD-10-CM | POA: Diagnosis not present

## 2023-01-26 DIAGNOSIS — M797 Fibromyalgia: Secondary | ICD-10-CM | POA: Diagnosis not present

## 2023-01-26 DIAGNOSIS — F419 Anxiety disorder, unspecified: Secondary | ICD-10-CM | POA: Diagnosis not present

## 2023-01-26 DIAGNOSIS — E8721 Acute metabolic acidosis: Secondary | ICD-10-CM | POA: Diagnosis not present

## 2023-01-26 DIAGNOSIS — G894 Chronic pain syndrome: Secondary | ICD-10-CM | POA: Diagnosis not present

## 2023-01-26 DIAGNOSIS — J441 Chronic obstructive pulmonary disease with (acute) exacerbation: Secondary | ICD-10-CM | POA: Diagnosis not present

## 2023-01-28 DIAGNOSIS — G43909 Migraine, unspecified, not intractable, without status migrainosus: Secondary | ICD-10-CM | POA: Diagnosis not present

## 2023-01-28 DIAGNOSIS — E876 Hypokalemia: Secondary | ICD-10-CM | POA: Diagnosis not present

## 2023-01-28 DIAGNOSIS — R7303 Prediabetes: Secondary | ICD-10-CM | POA: Diagnosis not present

## 2023-01-28 DIAGNOSIS — J440 Chronic obstructive pulmonary disease with acute lower respiratory infection: Secondary | ICD-10-CM | POA: Diagnosis not present

## 2023-01-28 DIAGNOSIS — F419 Anxiety disorder, unspecified: Secondary | ICD-10-CM | POA: Diagnosis not present

## 2023-01-28 DIAGNOSIS — B333 Retrovirus infections, not elsewhere classified: Secondary | ICD-10-CM | POA: Diagnosis not present

## 2023-01-28 DIAGNOSIS — F341 Dysthymic disorder: Secondary | ICD-10-CM | POA: Diagnosis not present

## 2023-01-28 DIAGNOSIS — M797 Fibromyalgia: Secondary | ICD-10-CM | POA: Diagnosis not present

## 2023-01-28 DIAGNOSIS — I110 Hypertensive heart disease with heart failure: Secondary | ICD-10-CM | POA: Diagnosis not present

## 2023-01-28 DIAGNOSIS — J189 Pneumonia, unspecified organism: Secondary | ICD-10-CM | POA: Diagnosis not present

## 2023-01-28 DIAGNOSIS — R339 Retention of urine, unspecified: Secondary | ICD-10-CM | POA: Diagnosis not present

## 2023-01-28 DIAGNOSIS — E8721 Acute metabolic acidosis: Secondary | ICD-10-CM | POA: Diagnosis not present

## 2023-01-28 DIAGNOSIS — G4700 Insomnia, unspecified: Secondary | ICD-10-CM | POA: Diagnosis not present

## 2023-01-28 DIAGNOSIS — I5032 Chronic diastolic (congestive) heart failure: Secondary | ICD-10-CM | POA: Diagnosis not present

## 2023-01-28 DIAGNOSIS — K589 Irritable bowel syndrome without diarrhea: Secondary | ICD-10-CM | POA: Diagnosis not present

## 2023-01-28 DIAGNOSIS — D72829 Elevated white blood cell count, unspecified: Secondary | ICD-10-CM | POA: Diagnosis not present

## 2023-01-28 DIAGNOSIS — J441 Chronic obstructive pulmonary disease with (acute) exacerbation: Secondary | ICD-10-CM | POA: Diagnosis not present

## 2023-01-28 DIAGNOSIS — G9332 Myalgic encephalomyelitis/chronic fatigue syndrome: Secondary | ICD-10-CM | POA: Diagnosis not present

## 2023-01-28 DIAGNOSIS — J4489 Other specified chronic obstructive pulmonary disease: Secondary | ICD-10-CM | POA: Diagnosis not present

## 2023-01-28 DIAGNOSIS — E039 Hypothyroidism, unspecified: Secondary | ICD-10-CM | POA: Diagnosis not present

## 2023-01-28 DIAGNOSIS — J309 Allergic rhinitis, unspecified: Secondary | ICD-10-CM | POA: Diagnosis not present

## 2023-01-28 DIAGNOSIS — M5030 Other cervical disc degeneration, unspecified cervical region: Secondary | ICD-10-CM | POA: Diagnosis not present

## 2023-01-28 DIAGNOSIS — G894 Chronic pain syndrome: Secondary | ICD-10-CM | POA: Diagnosis not present

## 2023-01-28 DIAGNOSIS — F32A Depression, unspecified: Secondary | ICD-10-CM | POA: Diagnosis not present

## 2023-01-28 DIAGNOSIS — R471 Dysarthria and anarthria: Secondary | ICD-10-CM | POA: Diagnosis not present

## 2023-01-28 DIAGNOSIS — F411 Generalized anxiety disorder: Secondary | ICD-10-CM | POA: Diagnosis not present

## 2023-01-28 DIAGNOSIS — G4733 Obstructive sleep apnea (adult) (pediatric): Secondary | ICD-10-CM | POA: Diagnosis not present

## 2023-01-29 ENCOUNTER — Other Ambulatory Visit: Payer: Medicare HMO

## 2023-01-29 DIAGNOSIS — J189 Pneumonia, unspecified organism: Secondary | ICD-10-CM | POA: Diagnosis not present

## 2023-01-29 DIAGNOSIS — E039 Hypothyroidism, unspecified: Secondary | ICD-10-CM | POA: Diagnosis not present

## 2023-01-29 DIAGNOSIS — R7303 Prediabetes: Secondary | ICD-10-CM | POA: Diagnosis not present

## 2023-01-29 DIAGNOSIS — F419 Anxiety disorder, unspecified: Secondary | ICD-10-CM | POA: Diagnosis not present

## 2023-01-29 DIAGNOSIS — M5030 Other cervical disc degeneration, unspecified cervical region: Secondary | ICD-10-CM | POA: Diagnosis not present

## 2023-01-29 DIAGNOSIS — R471 Dysarthria and anarthria: Secondary | ICD-10-CM | POA: Diagnosis not present

## 2023-01-29 DIAGNOSIS — I5032 Chronic diastolic (congestive) heart failure: Secondary | ICD-10-CM | POA: Diagnosis not present

## 2023-01-29 DIAGNOSIS — R339 Retention of urine, unspecified: Secondary | ICD-10-CM | POA: Diagnosis not present

## 2023-01-29 DIAGNOSIS — E876 Hypokalemia: Secondary | ICD-10-CM | POA: Diagnosis not present

## 2023-01-29 DIAGNOSIS — E8721 Acute metabolic acidosis: Secondary | ICD-10-CM | POA: Diagnosis not present

## 2023-01-29 DIAGNOSIS — J440 Chronic obstructive pulmonary disease with acute lower respiratory infection: Secondary | ICD-10-CM | POA: Diagnosis not present

## 2023-01-29 DIAGNOSIS — G4700 Insomnia, unspecified: Secondary | ICD-10-CM | POA: Diagnosis not present

## 2023-01-29 DIAGNOSIS — K589 Irritable bowel syndrome without diarrhea: Secondary | ICD-10-CM | POA: Diagnosis not present

## 2023-01-29 DIAGNOSIS — G4733 Obstructive sleep apnea (adult) (pediatric): Secondary | ICD-10-CM | POA: Diagnosis not present

## 2023-01-29 DIAGNOSIS — G894 Chronic pain syndrome: Secondary | ICD-10-CM | POA: Diagnosis not present

## 2023-01-29 DIAGNOSIS — F411 Generalized anxiety disorder: Secondary | ICD-10-CM | POA: Diagnosis not present

## 2023-01-29 DIAGNOSIS — J309 Allergic rhinitis, unspecified: Secondary | ICD-10-CM | POA: Diagnosis not present

## 2023-01-29 DIAGNOSIS — M797 Fibromyalgia: Secondary | ICD-10-CM | POA: Diagnosis not present

## 2023-01-29 DIAGNOSIS — G9332 Myalgic encephalomyelitis/chronic fatigue syndrome: Secondary | ICD-10-CM | POA: Diagnosis not present

## 2023-01-29 DIAGNOSIS — F32A Depression, unspecified: Secondary | ICD-10-CM | POA: Diagnosis not present

## 2023-01-29 DIAGNOSIS — I110 Hypertensive heart disease with heart failure: Secondary | ICD-10-CM | POA: Diagnosis not present

## 2023-01-29 DIAGNOSIS — J441 Chronic obstructive pulmonary disease with (acute) exacerbation: Secondary | ICD-10-CM | POA: Diagnosis not present

## 2023-01-29 DIAGNOSIS — G43909 Migraine, unspecified, not intractable, without status migrainosus: Secondary | ICD-10-CM | POA: Diagnosis not present

## 2023-01-29 DIAGNOSIS — D72829 Elevated white blood cell count, unspecified: Secondary | ICD-10-CM | POA: Diagnosis not present

## 2023-01-29 DIAGNOSIS — F341 Dysthymic disorder: Secondary | ICD-10-CM | POA: Diagnosis not present

## 2023-01-29 DIAGNOSIS — B333 Retrovirus infections, not elsewhere classified: Secondary | ICD-10-CM | POA: Diagnosis not present

## 2023-01-29 DIAGNOSIS — J4489 Other specified chronic obstructive pulmonary disease: Secondary | ICD-10-CM | POA: Diagnosis not present

## 2023-01-30 ENCOUNTER — Ambulatory Visit (INDEPENDENT_AMBULATORY_CARE_PROVIDER_SITE_OTHER): Payer: Medicare HMO | Admitting: Student

## 2023-01-30 ENCOUNTER — Encounter: Payer: Self-pay | Admitting: Student

## 2023-01-30 VITALS — BP 115/63 | HR 68 | Temp 98.4°F | Resp 22 | Ht 69.0 in | Wt 357.4 lb

## 2023-01-30 DIAGNOSIS — F17210 Nicotine dependence, cigarettes, uncomplicated: Secondary | ICD-10-CM

## 2023-01-30 DIAGNOSIS — J441 Chronic obstructive pulmonary disease with (acute) exacerbation: Secondary | ICD-10-CM

## 2023-01-30 DIAGNOSIS — R7303 Prediabetes: Secondary | ICD-10-CM

## 2023-01-30 DIAGNOSIS — R7989 Other specified abnormal findings of blood chemistry: Secondary | ICD-10-CM

## 2023-01-30 HISTORY — DX: Other specified abnormal findings of blood chemistry: R79.89

## 2023-01-30 MED ORDER — NICOTINE 21 MG/24HR TD PT24: 21.0000 mg | MEDICATED_PATCH | TRANSDERMAL | 0 refills | Status: AC

## 2023-01-30 MED ORDER — EUCERIN ORIGINAL HEALING EX CREA: TOPICAL_CREAM | CUTANEOUS | 0 refills | Status: DC | PRN

## 2023-01-30 NOTE — Patient Instructions (Addendum)
Thank you for allowing me to be a part of your care team. Today we discussed a few things:  I ordered PFT's (Pulmonary Function Testing) to further evaluate your shortness of breath. In the meantime, please use your Trelegy as prescribed and rescue inhaler as needed. I have sent in an order for nicotine patches and Eucerin cream. We will follow-up in one month.

## 2023-01-30 NOTE — Assessment & Plan Note (Signed)
Patient with mild elevation in creatinine during recent admission. Did not meet criteria for AKI. Creatinine was 1.07 day of discharge with a baseline around 0.9 -Check BMP

## 2023-01-30 NOTE — Assessment & Plan Note (Signed)
Patient discharged 01/23/2023 for suspected COPD exacerbation. Patient has never been formally diagnosed with COPD. Patient instructed to continue Trelegy upon discharge (previously using Symbicort). Today patient's vitals unremarkable with O2 saturation at 97%. Patient has a 10-pack-year history and is currently smoking 1/3 ppd. Patient counseled on the importance of smoking cessation and expresses a desire to quit. -PFT's ordered -Nicotine patches ordered

## 2023-01-30 NOTE — Progress Notes (Signed)
CC: Follow-up  HPI:  [name] is a 57 y.o. female living with a history stated below and presents today for a follow-up after being discharged 01/23/2023 for a suspected COPD exacerbation . Please see problem based assessment and plan for additional details.  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     albuterol (VENTOLIN HFA) 108 (90 Base) MCG/ACT inhaler Inhale 1-2 puffs into the lungs every 6 (six) hours as needed for wheezing or shortness of breath. 1 each 0   amitriptyline (ELAVIL) 100 MG tablet Take 100 mg by mouth at bedtime.     amLODipine (NORVASC) 10 MG tablet Take 1 tablet (10 mg total) by mouth daily. 90 tablet 3   ASPERCREME LIDOCAINE EX Apply 1 application topically 2 (two) times daily as needed (for muscle pain). (Patient not taking: Reported on 01/24/2023)     azelastine (ASTELIN) 0.1 % nasal spray USE 2 SPRAYS EACH NOSTRIL TWICE DAILY. (Patient taking differently: Place 2 sprays into both nostrils 2 (two) times daily.) 30 mL 0   clobetasol ointment (TEMOVATE) 0.05 % Apply 1 application topically as needed (for [place]).     diazepam (VALIUM) 5 MG tablet Take 1 tablet (5 mg total) by mouth every 8 (eight) hours as needed for anxiety. (Patient taking differently: Take 5 mg by mouth 2 (two) times daily.) 90 tablet 0   fluticasone (FLONASE) 50 MCG/ACT nasal spray USE 1  SPRAY IN EACH NOSTRIL DAILY. 16 g 0   Fluticasone-Umeclidin-Vilant (TRELEGY ELLIPTA) 200-62.5-25 MCG/ACT AEPB Inhale 1 Inhalation into the lungs daily at 6 (six) AM. 60 each 1   furosemide (LASIX) 40 MG tablet Take 1 tablet (40 mg total) by mouth daily. 10 tablet 0   glucose blood test strip Use as instructed 100 each prn   hydrOXYzine (ATARAX/VISTARIL) 10 MG tablet Take 1 tablet (10 mg total) by mouth at bedtime. (Patient taking differently: Take 10 mg by mouth 2 (two) times daily.) 90 tablet 3   Incontinence Supply Disposable (PREVAIL WASHCLOTHS) MISC Use daily as directed 96 each PRN   Lancets Thin MISC Use to check home glucose daily 100 each prn   levothyroxine (SYNTHROID, LEVOTHROID) 50 MCG tablet Take 1 tablet (50 mcg total) by mouth daily before breakfast. 90 tablet 3   meloxicam (MOBIC) 15 MG tablet Take 1 tablet (15 mg total) by mouth daily as needed (moderate pain). 30 tablet 0   metoprolol tartrate (LOPRESSOR) 100 MG tablet Take 1 tablet (100 mg total) by mouth 2 (two) times daily. 180 tablet 3   Nerve Stimulator (PRO COMFORT TENS UNIT) DEVI 1 Device by Does not apply route daily as needed. 1 Device 1   nystatin cream (MYCOSTATIN) Apply 1 application topically 2 (two) times daily.     ondansetron (  ZOFRAN-ODT) 8 MG disintegrating tablet Take 1 tablet (8 mg total) by mouth every 8 (eight) hours as needed for nausea or vomiting. 20 tablet 0   potassium chloride SA (KLOR-CON M) 20 MEQ tablet Take 1 tablet (20 mEq total) by mouth 2 (two) times daily. 20 tablet 0   rosuvastatin (CRESTOR) 5 MG tablet Take 5 mg by mouth daily.     triamcinolone ointment (KENALOG) 0.5 % Apply 1 application topically daily.     vitamin B-12 (CYANOCOBALAMIN) 100 MCG tablet Take 100 mcg by mouth once a week.     Vitamin D, Ergocalciferol, (DRISDOL) 1.25 MG (50000 UNIT) CAPS capsule Take 50,000 Units by mouth once a week. On Saturday     No current facility-administered medications on file prior to visit.     Family History  Problem Relation Age of Onset   Diabetes Mother    Heart disease Mother    Hyperlipidemia Mother    Hypertension Mother    Stroke Mother    Mental illness Mother    Heart failure Mother    Headache Mother        Brianna Roberts Cyst   Breast cancer Mother 21       diagnosed 3 times   Heart disease Father    Alcohol abuse Father    Hypertension Sister    Heart disease Brother    Mental illness Brother    COPD Brother    Heart failure Brother    Hypertension Sister    Colon cancer Neg Hx     Social History   Socioeconomic History   Marital status: Widowed    Spouse name: Brianna Roberts   Number of children: 0   Years of education: college+   Highest education level: Bachelor's degree (e.g., BA, AB, BS)  Occupational History   Occupation: disability    Comment: fibromyalgia  Tobacco Use   Smoking status: Every Day    Current packs/day: 0.30    Average packs/day: 0.3 packs/day for 33.0 years (9.9 ttl pk-yrs)    Types: Cigarettes   Smokeless tobacco: Never  Vaping Use   Vaping status: Never Used  Substance and Sexual Activity   Alcohol use: No   Drug use: No   Sexual activity: Never  Other Topics Concern   Not on file  Social History Narrative   Moved to [place], [place] from [place], [place] 04/28/2016 in need of a change.   Her family remains in [place].   Lives alone.   Social Determinants of Health   Financial Resource Strain: High Risk (01/28/2023)   Overall Financial Resource Strain (CARDIA)    Difficulty of Paying Living Expenses: Very hard  Food Insecurity: Food Insecurity Present (01/28/2023)   Hunger Vital Sign    Worried About Running Out of Food in the Last Year: Often true    Ran Out of Food in the Last Year: Often true  Transportation Needs: No Transportation Needs (01/28/2023)   PRAPARE - Administrator, Civil Service (Medical): No    Lack of Transportation (Non-Medical): No  Recent Concern: Transportation  Needs - Unmet Transportation Needs (11/07/2022)   Received from [facility], [facility]   PRAPARE - Transportation    Lack of Transportation (Medical): No    Lack of Transportation (Non-Medical): Yes  Physical Activity: Unknown (01/28/2023)   Exercise Vital Sign    Days of Exercise per Week: 0 days    Minutes of Exercise per Session: Not on file  Stress: Stress Concern  Present (01/28/2023)   [employer] of Occupational Health - Occupational Stress Questionnaire    Feeling of Stress : Rather much  Social Connections: Socially Isolated (01/28/2023)   Social Connection and Isolation Panel [NHANES]    Frequency of Communication with Friends and Family: Three times a week    Frequency of Social Gatherings with Friends and Family: Never    Attends Religious Services: Never    Database administrator or Organizations: No    Attends Engineer, structural: Not on file    Marital Status: Widowed  Intimate Partner Violence: Not At Risk (11/07/2022)   Received from [facility], [facility]   HITS    Over the last 12 months how often did your partner physically hurt you?: 1    Over the last 12 months how often did your partner insult you or talk down to you?: 1    Over the last 12 months how often did your partner threaten you with physical harm?: 1    Over the last 12 months how often did your partner scream or curse at you?: 1    Review of Systems: ROS negative except for what is noted on the assessment and plan.  Vitals:   01/30/23 1034  BP: 115/63  Pulse: 68  Resp: (!) 22  Temp: 98.4 F (36.9 C)  TempSrc: Oral  SpO2: 97%  Weight: (!) 357 lb 6.4 oz (162.1 kg)  Height: 5\' 9"  (1.753 m)    Physical Exam: Constitutional: obese, sitting in chair, in no acute distress Cardiovascular: regular rate and rhythm, no m/r/g Pulmonary/Chest: mildly increased work of breathing on room air, lungs clear to auscultation bilaterally Psych: normal mood and behavior  Assessment &  Plan:     Patient seen with Dr. Sol Blazing  COPD exacerbation [facility]) Patient discharged 01/23/2023 for suspected COPD exacerbation. Patient has never been formally diagnosed with COPD. Patient instructed to continue Trelegy upon discharge (previously using Symbicort). Today patient's vitals unremarkable with O2 saturation at 97%. Patient has a 10-pack-year history and is currently smoking 1/3 ppd. Patient counseled on the importance of smoking cessation and expresses a desire to quit. -PFT's ordered -Nicotine patches ordered  Elevated serum creatinine Patient with mild elevation in creatinine during recent admission. Did not meet criteria for AKI. Creatinine was 1.07 day of discharge with a baseline around 0.9 -Check BMP    Carmina Miller, D.O. [facility] Internal Medicine, PGY-1 Phone: [phone] Date 01/30/2023 Time 1:25 PM

## 2023-01-31 ENCOUNTER — Telehealth: Payer: Self-pay

## 2023-01-31 NOTE — Progress Notes (Signed)
Internal Medicine Clinic Attending  I saw and evaluated the patient.  I personally confirmed the key portions of the history and exam documented by Dr. Annie Paras and I reviewed pertinent patient test results.  The assessment, diagnosis, and plan were formulated together and I agree with the documentation in the resident's note.

## 2023-01-31 NOTE — Telephone Encounter (Signed)
Pt is requesting a call back she stated that  at her visit with PCP yesterday she was told to increase her  Lasix but it is  still showing  as take one instead of what he said take 2 per day .Marland Kitchen

## 2023-02-01 NOTE — Progress Notes (Signed)
Spoke with patient on the phone. BMP unremarkable.

## 2023-02-02 ENCOUNTER — Telehealth: Payer: Medicare HMO | Admitting: Family Medicine

## 2023-02-02 DIAGNOSIS — B3731 Acute candidiasis of vulva and vagina: Secondary | ICD-10-CM

## 2023-02-02 MED ORDER — FLUCONAZOLE 150 MG PO TABS: 150.0000 mg | ORAL_TABLET | ORAL | 0 refills | Status: DC

## 2023-02-02 NOTE — Patient Instructions (Signed)

## 2023-02-02 NOTE — Progress Notes (Signed)
Virtual Visit Consent   Brianna Roberts, you are scheduled for a virtual visit with a [facility] provider today. Just as with appointments in the office, your consent must be obtained to participate. Your consent will be active for this visit and any virtual visit you may have with one of our providers in the next 365 days. If you have a MyChart account, a copy of this consent can be sent to you electronically.  As this is a virtual visit, video technology does not allow for your provider to perform a traditional examination. This may limit your provider's ability to fully assess your condition. If your provider identifies any concerns that need to be evaluated in person or the need to arrange testing (such as labs, EKG, etc.), we will make arrangements to do so. Although advances in technology are sophisticated, we cannot ensure that it will always work on either your end or our end. If the connection with a video visit is poor, the visit may have to be switched to a telephone visit. With either a video or telephone visit, we are not always able to ensure that we have a secure connection.  By engaging in this virtual visit, you consent to the provision of healthcare and authorize for your insurance to be billed (if applicable) for the services provided during this visit. Depending on your insurance coverage, you may receive a charge related to this service.  I need to obtain your verbal consent now. Are you willing to proceed with your visit today? Brianna Roberts has provided verbal consent on 02/02/2023 for a virtual visit (video or telephone). Brianna Curio, FNP  Date: 02/02/2023 10:26 AM  Virtual Visit via Video Note   I, Brianna Roberts, connected with  Brianna Roberts  ([id number], 08/27/1965) on 02/02/23 at 11:15 AM EDT by a video-enabled telemedicine application and verified that I am speaking with the correct person using two identifiers.  Location: Patient: Virtual Visit Location Patient:  Home Provider: Virtual Visit Location Provider: Home Office   I discussed the limitations of evaluation and management by telemedicine and the availability of in person appointments. The patient expressed understanding and agreed to proceed.    History of Present Illness: Brianna Roberts is a 57 y.o. who identifies as a female who was assigned female at birth, and is being seen today for vaginal itching and white discharge with swelling in vaginal area. She took a diflucan last night which helped the itching but sxx persist. She has been on multiple antibiotics since January for a back abscess and pneumonia. She has not had sex in over 10 yrs. Marland Kitchen  HPI: HPI  Problems:  Patient Active Problem List   Diagnosis Date Noted   Elevated serum creatinine 01/30/2023   COPD exacerbation (HCC) 01/23/2023   Dysarthria 12/13/2017   Headache 12/13/2017   Chronic pain syndrome 03/17/2017   Incomplete emptying of bladder 02/22/2017   Prediabetes 12/31/2016   Post concussion syndrome 10/06/2016   HTLV I (human T-cell lymphotropic virus 1 infection) 10/05/2016   Fibromyalgia 10/05/2016   Benign essential HTN 10/05/2016   Hypothyroidism 10/05/2016   Allergic rhinitis 10/05/2016   Chronic fatigue syndrome 10/05/2016   Asthma, chronic 10/05/2016   HSV (herpes simplex virus) anogenital infection 10/05/2016   Anxiety and depression 10/05/2016   Bilateral leg edema 10/05/2016   Ovarian cyst 10/05/2016   Nephrolithiasis 10/05/2016   IBS (irritable bowel syndrome) 10/05/2016   OSA (obstructive sleep apnea) 10/05/2016   Left-sided weakness 10/05/2016  Migraine 10/05/2016   BMI 45.0-49.9, adult (HCC) 10/05/2016   DDD (degenerative disc disease), cervical    Insomnia     Allergies:  Allergies  Allergen Reactions   Hydromorphone Itching   Iodinated Contrast Media Anaphylaxis, Swelling and Other (See Comments)    Patient stated this was a "one off" Patient complained of throat/ uvula swelling s/p  injection on 02/01/2017 but states she isnt allergic to the dye and has had several times prior     Sulfa Antibiotics Anaphylaxis   Codeine Other (See Comments)    Patient said mother told her she was allergic as a child; patient states she is not allergic   Nickel Rash    Patient states she is not allergic   Penicillins Rash and Other (See Comments)    Told she was allergic from childhood: patient states she is not allergic   Spironolactone Nausea Only and Other (See Comments)    Caused GI isues   Cefdinir Hives   Adhesive [Tape] Rash   Medications:  Current Outpatient Medications:    fluconazole (DIFLUCAN) 150 MG tablet, Take 1 tablet (150 mg total) by mouth as directed for 6 doses. Take 1 tab every other day for 6 days- 3 doses, Disp: 3 tablet, Rfl: 0   acetaminophen (TYLENOL) 325 MG tablet, Take 650 mg by mouth every 6 (six) hours as needed (for pain)., Disp: , Rfl:    albuterol (VENTOLIN HFA) 108 (90 Base) MCG/ACT inhaler, Inhale 1-2 puffs into the lungs every 6 (six) hours as needed for wheezing or shortness of breath., Disp: 1 each, Rfl: 0   amitriptyline (ELAVIL) 100 MG tablet, Take 100 mg by mouth at bedtime., Disp: , Rfl:    amLODipine (NORVASC) 10 MG tablet, Take 1 tablet (10 mg total) by mouth daily., Disp: 90 tablet, Rfl: 3   ASPERCREME LIDOCAINE EX, Apply 1 application topically 2 (two) times daily as needed (for muscle pain). (Patient not taking: Reported on 01/24/2023), Disp: , Rfl:    azelastine (ASTELIN) 0.1 % nasal spray, USE 2 SPRAYS EACH NOSTRIL TWICE DAILY. (Patient taking differently: Place 2 sprays into both nostrils 2 (two) times daily.), Disp: 30 mL, Rfl: 0   clobetasol ointment (TEMOVATE) 0.05 %, Apply 1 application topically as needed (for [place])., Disp: , Rfl:    diazepam (VALIUM) 5 MG tablet, Take 1 tablet (5 mg total) by mouth every 8 (eight) hours as needed for anxiety. (Patient taking differently: Take 5 mg by mouth 2 (two) times daily.), Disp: 90 tablet,  Rfl: 0   fluticasone (FLONASE) 50 MCG/ACT nasal spray, USE 1 SPRAY IN EACH NOSTRIL DAILY., Disp: 16 g, Rfl: 0   Fluticasone-Umeclidin-Vilant (TRELEGY ELLIPTA) 200-62.5-25 MCG/ACT AEPB, Inhale 1 Inhalation into the lungs daily at 6 (six) AM., Disp: 60 each, Rfl: 1   furosemide (LASIX) 40 MG tablet, Take 1 tablet (40 mg total) by mouth daily., Disp: 10 tablet, Rfl: 0   glucose blood test strip, Use as instructed, Disp: 100 each, Rfl: prn   hydrOXYzine (ATARAX/VISTARIL) 10 MG tablet, Take 1 tablet (10 mg total) by mouth at bedtime. (Patient taking differently: Take 10 mg by mouth 2 (two) times daily.), Disp: 90 tablet, Rfl: 3   Incontinence Supply Disposable (PREVAIL WASHCLOTHS) MISC, Use daily as directed, Disp: 96 each, Rfl: PRN   Lancets Thin MISC, Use to check home glucose daily, Disp: 100 each, Rfl: prn   levothyroxine (SYNTHROID, LEVOTHROID) 50 MCG tablet, Take 1 tablet (50 mcg total) by mouth daily before breakfast., Disp:  90 tablet, Rfl: 3   meloxicam (MOBIC) 15 MG tablet, Take 1 tablet (15 mg total) by mouth daily as needed (moderate pain)., Disp: 30 tablet, Rfl: 0   metoprolol tartrate (LOPRESSOR) 100 MG tablet, Take 1 tablet (100 mg total) by mouth 2 (two) times daily., Disp: 180 tablet, Rfl: 3   Nerve Stimulator (PRO COMFORT TENS UNIT) DEVI, 1 Device by Does not apply route daily as needed., Disp: 1 Device, Rfl: 1   nicotine (NICODERM CQ - DOSED IN MG/24 HOURS) 21 mg/24hr patch, Place 1 patch (21 mg total) onto the skin daily., Disp: 30 patch, Rfl: 0   nystatin cream (MYCOSTATIN), Apply 1 application topically 2 (two) times daily., Disp: , Rfl:    ondansetron (ZOFRAN-ODT) 8 MG disintegrating tablet, Take 1 tablet (8 mg total) by mouth every 8 (eight) hours as needed for nausea or vomiting., Disp: 20 tablet, Rfl: 0   potassium chloride SA (KLOR-CON M) 20 MEQ tablet, Take 1 tablet (20 mEq total) by mouth 2 (two) times daily., Disp: 20 tablet, Rfl: 0   rosuvastatin (CRESTOR) 5 MG tablet, Take 5  mg by mouth daily., Disp: , Rfl:    Skin Protectants, Misc. (EUCERIN) cream, Apply topically as needed for dry skin., Disp: 454 g, Rfl: 0   triamcinolone ointment (KENALOG) 0.5 %, Apply 1 application topically daily., Disp: , Rfl:    vitamin B-12 (CYANOCOBALAMIN) 100 MCG tablet, Take 100 mcg by mouth once a week., Disp: , Rfl:    Vitamin D, Ergocalciferol, (DRISDOL) 1.25 MG (50000 UNIT) CAPS capsule, Take 50,000 Units by mouth once a week. On Saturday, Disp: , Rfl:   Observations/Objective: Patient is well-developed, well-nourished in no acute distress.  Resting comfortably  at home.  Head is normocephalic, atraumatic.  No labored breathing.  Speech is clear and coherent with logical content.  Patient is alert and oriented at baseline.    Assessment and Plan: 1. Candidiasis of vagina  Med use and side effects discussed, follow up with internal med as needed if sx persist or worsen.   Follow Up Instructions: I discussed the assessment and treatment plan with the patient. The patient was provided an opportunity to ask questions and all were answered. The patient agreed with the plan and demonstrated an understanding of the instructions.  A copy of instructions were sent to the patient via MyChart unless otherwise noted below.     The patient was advised to call back or seek an in-person evaluation if the symptoms worsen or if the condition fails to improve as anticipated.  Time:  I spent 10 minutes with the patient via telehealth technology discussing the above problems/concerns.    Brianna Curio, FNP

## 2023-02-04 DIAGNOSIS — D72829 Elevated white blood cell count, unspecified: Secondary | ICD-10-CM | POA: Diagnosis not present

## 2023-02-04 DIAGNOSIS — J440 Chronic obstructive pulmonary disease with acute lower respiratory infection: Secondary | ICD-10-CM | POA: Diagnosis not present

## 2023-02-04 DIAGNOSIS — G4733 Obstructive sleep apnea (adult) (pediatric): Secondary | ICD-10-CM | POA: Diagnosis not present

## 2023-02-04 DIAGNOSIS — J309 Allergic rhinitis, unspecified: Secondary | ICD-10-CM | POA: Diagnosis not present

## 2023-02-04 DIAGNOSIS — M797 Fibromyalgia: Secondary | ICD-10-CM | POA: Diagnosis not present

## 2023-02-04 DIAGNOSIS — J189 Pneumonia, unspecified organism: Secondary | ICD-10-CM | POA: Diagnosis not present

## 2023-02-04 DIAGNOSIS — R7303 Prediabetes: Secondary | ICD-10-CM | POA: Diagnosis not present

## 2023-02-04 DIAGNOSIS — F419 Anxiety disorder, unspecified: Secondary | ICD-10-CM | POA: Diagnosis not present

## 2023-02-04 DIAGNOSIS — J441 Chronic obstructive pulmonary disease with (acute) exacerbation: Secondary | ICD-10-CM | POA: Diagnosis not present

## 2023-02-04 DIAGNOSIS — E039 Hypothyroidism, unspecified: Secondary | ICD-10-CM | POA: Diagnosis not present

## 2023-02-04 DIAGNOSIS — M5030 Other cervical disc degeneration, unspecified cervical region: Secondary | ICD-10-CM | POA: Diagnosis not present

## 2023-02-04 DIAGNOSIS — G9332 Myalgic encephalomyelitis/chronic fatigue syndrome: Secondary | ICD-10-CM | POA: Diagnosis not present

## 2023-02-04 DIAGNOSIS — I110 Hypertensive heart disease with heart failure: Secondary | ICD-10-CM | POA: Diagnosis not present

## 2023-02-04 DIAGNOSIS — F32A Depression, unspecified: Secondary | ICD-10-CM | POA: Diagnosis not present

## 2023-02-04 DIAGNOSIS — J4489 Other specified chronic obstructive pulmonary disease: Secondary | ICD-10-CM | POA: Diagnosis not present

## 2023-02-04 DIAGNOSIS — E876 Hypokalemia: Secondary | ICD-10-CM | POA: Diagnosis not present

## 2023-02-04 DIAGNOSIS — G43909 Migraine, unspecified, not intractable, without status migrainosus: Secondary | ICD-10-CM | POA: Diagnosis not present

## 2023-02-04 DIAGNOSIS — K589 Irritable bowel syndrome without diarrhea: Secondary | ICD-10-CM | POA: Diagnosis not present

## 2023-02-04 DIAGNOSIS — G894 Chronic pain syndrome: Secondary | ICD-10-CM | POA: Diagnosis not present

## 2023-02-04 DIAGNOSIS — R339 Retention of urine, unspecified: Secondary | ICD-10-CM | POA: Diagnosis not present

## 2023-02-04 DIAGNOSIS — B333 Retrovirus infections, not elsewhere classified: Secondary | ICD-10-CM | POA: Diagnosis not present

## 2023-02-04 DIAGNOSIS — I5032 Chronic diastolic (congestive) heart failure: Secondary | ICD-10-CM | POA: Diagnosis not present

## 2023-02-04 DIAGNOSIS — R471 Dysarthria and anarthria: Secondary | ICD-10-CM | POA: Diagnosis not present

## 2023-02-04 DIAGNOSIS — G4700 Insomnia, unspecified: Secondary | ICD-10-CM | POA: Diagnosis not present

## 2023-02-04 DIAGNOSIS — E8721 Acute metabolic acidosis: Secondary | ICD-10-CM | POA: Diagnosis not present

## 2023-02-05 DIAGNOSIS — J4489 Other specified chronic obstructive pulmonary disease: Secondary | ICD-10-CM | POA: Diagnosis not present

## 2023-02-05 DIAGNOSIS — I110 Hypertensive heart disease with heart failure: Secondary | ICD-10-CM | POA: Diagnosis not present

## 2023-02-05 DIAGNOSIS — B333 Retrovirus infections, not elsewhere classified: Secondary | ICD-10-CM | POA: Diagnosis not present

## 2023-02-05 DIAGNOSIS — R471 Dysarthria and anarthria: Secondary | ICD-10-CM | POA: Diagnosis not present

## 2023-02-05 DIAGNOSIS — R7303 Prediabetes: Secondary | ICD-10-CM | POA: Diagnosis not present

## 2023-02-05 DIAGNOSIS — J189 Pneumonia, unspecified organism: Secondary | ICD-10-CM | POA: Diagnosis not present

## 2023-02-05 DIAGNOSIS — G4700 Insomnia, unspecified: Secondary | ICD-10-CM | POA: Diagnosis not present

## 2023-02-05 DIAGNOSIS — K589 Irritable bowel syndrome without diarrhea: Secondary | ICD-10-CM | POA: Diagnosis not present

## 2023-02-05 DIAGNOSIS — I5032 Chronic diastolic (congestive) heart failure: Secondary | ICD-10-CM | POA: Diagnosis not present

## 2023-02-05 DIAGNOSIS — R339 Retention of urine, unspecified: Secondary | ICD-10-CM | POA: Diagnosis not present

## 2023-02-05 DIAGNOSIS — G9332 Myalgic encephalomyelitis/chronic fatigue syndrome: Secondary | ICD-10-CM | POA: Diagnosis not present

## 2023-02-05 DIAGNOSIS — J440 Chronic obstructive pulmonary disease with acute lower respiratory infection: Secondary | ICD-10-CM | POA: Diagnosis not present

## 2023-02-05 DIAGNOSIS — F32A Depression, unspecified: Secondary | ICD-10-CM | POA: Diagnosis not present

## 2023-02-05 DIAGNOSIS — J309 Allergic rhinitis, unspecified: Secondary | ICD-10-CM | POA: Diagnosis not present

## 2023-02-05 DIAGNOSIS — M797 Fibromyalgia: Secondary | ICD-10-CM | POA: Diagnosis not present

## 2023-02-05 DIAGNOSIS — D72829 Elevated white blood cell count, unspecified: Secondary | ICD-10-CM | POA: Diagnosis not present

## 2023-02-05 DIAGNOSIS — G4733 Obstructive sleep apnea (adult) (pediatric): Secondary | ICD-10-CM | POA: Diagnosis not present

## 2023-02-05 DIAGNOSIS — G894 Chronic pain syndrome: Secondary | ICD-10-CM | POA: Diagnosis not present

## 2023-02-05 DIAGNOSIS — J441 Chronic obstructive pulmonary disease with (acute) exacerbation: Secondary | ICD-10-CM | POA: Diagnosis not present

## 2023-02-05 DIAGNOSIS — E039 Hypothyroidism, unspecified: Secondary | ICD-10-CM | POA: Diagnosis not present

## 2023-02-05 DIAGNOSIS — E8721 Acute metabolic acidosis: Secondary | ICD-10-CM | POA: Diagnosis not present

## 2023-02-05 DIAGNOSIS — M5030 Other cervical disc degeneration, unspecified cervical region: Secondary | ICD-10-CM | POA: Diagnosis not present

## 2023-02-05 DIAGNOSIS — F419 Anxiety disorder, unspecified: Secondary | ICD-10-CM | POA: Diagnosis not present

## 2023-02-05 DIAGNOSIS — E876 Hypokalemia: Secondary | ICD-10-CM | POA: Diagnosis not present

## 2023-02-05 DIAGNOSIS — G43909 Migraine, unspecified, not intractable, without status migrainosus: Secondary | ICD-10-CM | POA: Diagnosis not present

## 2023-02-07 ENCOUNTER — Encounter: Payer: Medicare HMO | Admitting: Student

## 2023-02-07 DIAGNOSIS — R471 Dysarthria and anarthria: Secondary | ICD-10-CM | POA: Diagnosis not present

## 2023-02-07 DIAGNOSIS — I110 Hypertensive heart disease with heart failure: Secondary | ICD-10-CM | POA: Diagnosis not present

## 2023-02-07 DIAGNOSIS — M5030 Other cervical disc degeneration, unspecified cervical region: Secondary | ICD-10-CM | POA: Diagnosis not present

## 2023-02-07 DIAGNOSIS — B333 Retrovirus infections, not elsewhere classified: Secondary | ICD-10-CM | POA: Diagnosis not present

## 2023-02-07 DIAGNOSIS — K589 Irritable bowel syndrome without diarrhea: Secondary | ICD-10-CM | POA: Diagnosis not present

## 2023-02-07 DIAGNOSIS — J4489 Other specified chronic obstructive pulmonary disease: Secondary | ICD-10-CM | POA: Diagnosis not present

## 2023-02-07 DIAGNOSIS — F32A Depression, unspecified: Secondary | ICD-10-CM | POA: Diagnosis not present

## 2023-02-07 DIAGNOSIS — J189 Pneumonia, unspecified organism: Secondary | ICD-10-CM | POA: Diagnosis not present

## 2023-02-07 DIAGNOSIS — R7303 Prediabetes: Secondary | ICD-10-CM | POA: Diagnosis not present

## 2023-02-07 DIAGNOSIS — I5032 Chronic diastolic (congestive) heart failure: Secondary | ICD-10-CM | POA: Diagnosis not present

## 2023-02-07 DIAGNOSIS — J441 Chronic obstructive pulmonary disease with (acute) exacerbation: Secondary | ICD-10-CM | POA: Diagnosis not present

## 2023-02-07 DIAGNOSIS — G4733 Obstructive sleep apnea (adult) (pediatric): Secondary | ICD-10-CM | POA: Diagnosis not present

## 2023-02-07 DIAGNOSIS — J309 Allergic rhinitis, unspecified: Secondary | ICD-10-CM | POA: Diagnosis not present

## 2023-02-07 DIAGNOSIS — G894 Chronic pain syndrome: Secondary | ICD-10-CM | POA: Diagnosis not present

## 2023-02-07 DIAGNOSIS — E876 Hypokalemia: Secondary | ICD-10-CM | POA: Diagnosis not present

## 2023-02-07 DIAGNOSIS — D72829 Elevated white blood cell count, unspecified: Secondary | ICD-10-CM | POA: Diagnosis not present

## 2023-02-07 DIAGNOSIS — M797 Fibromyalgia: Secondary | ICD-10-CM | POA: Diagnosis not present

## 2023-02-07 DIAGNOSIS — R339 Retention of urine, unspecified: Secondary | ICD-10-CM | POA: Diagnosis not present

## 2023-02-07 DIAGNOSIS — E039 Hypothyroidism, unspecified: Secondary | ICD-10-CM | POA: Diagnosis not present

## 2023-02-07 DIAGNOSIS — E8721 Acute metabolic acidosis: Secondary | ICD-10-CM | POA: Diagnosis not present

## 2023-02-07 DIAGNOSIS — J440 Chronic obstructive pulmonary disease with acute lower respiratory infection: Secondary | ICD-10-CM | POA: Diagnosis not present

## 2023-02-07 DIAGNOSIS — F419 Anxiety disorder, unspecified: Secondary | ICD-10-CM | POA: Diagnosis not present

## 2023-02-07 DIAGNOSIS — G9332 Myalgic encephalomyelitis/chronic fatigue syndrome: Secondary | ICD-10-CM | POA: Diagnosis not present

## 2023-02-07 DIAGNOSIS — G4700 Insomnia, unspecified: Secondary | ICD-10-CM | POA: Diagnosis not present

## 2023-02-07 DIAGNOSIS — G43909 Migraine, unspecified, not intractable, without status migrainosus: Secondary | ICD-10-CM | POA: Diagnosis not present

## 2023-02-08 DIAGNOSIS — Z6841 Body Mass Index (BMI) 40.0 and over, adult: Secondary | ICD-10-CM | POA: Diagnosis not present

## 2023-02-08 DIAGNOSIS — G8929 Other chronic pain: Secondary | ICD-10-CM | POA: Diagnosis not present

## 2023-02-08 DIAGNOSIS — M25559 Pain in unspecified hip: Secondary | ICD-10-CM | POA: Diagnosis not present

## 2023-02-08 DIAGNOSIS — R030 Elevated blood-pressure reading, without diagnosis of hypertension: Secondary | ICD-10-CM | POA: Diagnosis not present

## 2023-02-08 DIAGNOSIS — E119 Type 2 diabetes mellitus without complications: Secondary | ICD-10-CM | POA: Diagnosis not present

## 2023-02-11 DIAGNOSIS — R6884 Jaw pain: Secondary | ICD-10-CM | POA: Diagnosis not present

## 2023-02-11 DIAGNOSIS — R600 Localized edema: Secondary | ICD-10-CM | POA: Diagnosis not present

## 2023-02-11 DIAGNOSIS — H6993 Unspecified Eustachian tube disorder, bilateral: Secondary | ICD-10-CM | POA: Diagnosis not present

## 2023-02-12 ENCOUNTER — Other Ambulatory Visit: Payer: Self-pay | Admitting: Otolaryngology

## 2023-02-12 ENCOUNTER — Other Ambulatory Visit (HOSPITAL_COMMUNITY): Payer: Self-pay | Admitting: Otolaryngology

## 2023-02-12 DIAGNOSIS — G894 Chronic pain syndrome: Secondary | ICD-10-CM | POA: Diagnosis not present

## 2023-02-12 DIAGNOSIS — R609 Edema, unspecified: Secondary | ICD-10-CM

## 2023-02-12 DIAGNOSIS — F32A Depression, unspecified: Secondary | ICD-10-CM | POA: Diagnosis not present

## 2023-02-12 DIAGNOSIS — D72829 Elevated white blood cell count, unspecified: Secondary | ICD-10-CM | POA: Diagnosis not present

## 2023-02-12 DIAGNOSIS — I110 Hypertensive heart disease with heart failure: Secondary | ICD-10-CM | POA: Diagnosis not present

## 2023-02-12 DIAGNOSIS — E876 Hypokalemia: Secondary | ICD-10-CM | POA: Diagnosis not present

## 2023-02-12 DIAGNOSIS — R471 Dysarthria and anarthria: Secondary | ICD-10-CM | POA: Diagnosis not present

## 2023-02-12 DIAGNOSIS — J441 Chronic obstructive pulmonary disease with (acute) exacerbation: Secondary | ICD-10-CM | POA: Diagnosis not present

## 2023-02-12 DIAGNOSIS — E039 Hypothyroidism, unspecified: Secondary | ICD-10-CM | POA: Diagnosis not present

## 2023-02-12 DIAGNOSIS — I5032 Chronic diastolic (congestive) heart failure: Secondary | ICD-10-CM | POA: Diagnosis not present

## 2023-02-12 DIAGNOSIS — G4700 Insomnia, unspecified: Secondary | ICD-10-CM | POA: Diagnosis not present

## 2023-02-12 DIAGNOSIS — R7303 Prediabetes: Secondary | ICD-10-CM | POA: Diagnosis not present

## 2023-02-12 DIAGNOSIS — J309 Allergic rhinitis, unspecified: Secondary | ICD-10-CM | POA: Diagnosis not present

## 2023-02-12 DIAGNOSIS — G4733 Obstructive sleep apnea (adult) (pediatric): Secondary | ICD-10-CM | POA: Diagnosis not present

## 2023-02-12 DIAGNOSIS — R339 Retention of urine, unspecified: Secondary | ICD-10-CM | POA: Diagnosis not present

## 2023-02-12 DIAGNOSIS — M5030 Other cervical disc degeneration, unspecified cervical region: Secondary | ICD-10-CM | POA: Diagnosis not present

## 2023-02-12 DIAGNOSIS — J189 Pneumonia, unspecified organism: Secondary | ICD-10-CM | POA: Diagnosis not present

## 2023-02-12 DIAGNOSIS — F419 Anxiety disorder, unspecified: Secondary | ICD-10-CM | POA: Diagnosis not present

## 2023-02-12 DIAGNOSIS — B333 Retrovirus infections, not elsewhere classified: Secondary | ICD-10-CM | POA: Diagnosis not present

## 2023-02-12 DIAGNOSIS — K589 Irritable bowel syndrome without diarrhea: Secondary | ICD-10-CM | POA: Diagnosis not present

## 2023-02-12 DIAGNOSIS — G9332 Myalgic encephalomyelitis/chronic fatigue syndrome: Secondary | ICD-10-CM | POA: Diagnosis not present

## 2023-02-12 DIAGNOSIS — E8721 Acute metabolic acidosis: Secondary | ICD-10-CM | POA: Diagnosis not present

## 2023-02-12 DIAGNOSIS — M797 Fibromyalgia: Secondary | ICD-10-CM | POA: Diagnosis not present

## 2023-02-12 DIAGNOSIS — J4489 Other specified chronic obstructive pulmonary disease: Secondary | ICD-10-CM | POA: Diagnosis not present

## 2023-02-12 DIAGNOSIS — J440 Chronic obstructive pulmonary disease with acute lower respiratory infection: Secondary | ICD-10-CM | POA: Diagnosis not present

## 2023-02-12 DIAGNOSIS — G43909 Migraine, unspecified, not intractable, without status migrainosus: Secondary | ICD-10-CM | POA: Diagnosis not present

## 2023-02-13 DIAGNOSIS — F419 Anxiety disorder, unspecified: Secondary | ICD-10-CM | POA: Diagnosis not present

## 2023-02-13 DIAGNOSIS — M797 Fibromyalgia: Secondary | ICD-10-CM | POA: Diagnosis not present

## 2023-02-13 DIAGNOSIS — B333 Retrovirus infections, not elsewhere classified: Secondary | ICD-10-CM | POA: Diagnosis not present

## 2023-02-13 DIAGNOSIS — F32A Depression, unspecified: Secondary | ICD-10-CM | POA: Diagnosis not present

## 2023-02-13 DIAGNOSIS — E876 Hypokalemia: Secondary | ICD-10-CM | POA: Diagnosis not present

## 2023-02-13 DIAGNOSIS — D72829 Elevated white blood cell count, unspecified: Secondary | ICD-10-CM | POA: Diagnosis not present

## 2023-02-13 DIAGNOSIS — E039 Hypothyroidism, unspecified: Secondary | ICD-10-CM | POA: Diagnosis not present

## 2023-02-13 DIAGNOSIS — G894 Chronic pain syndrome: Secondary | ICD-10-CM | POA: Diagnosis not present

## 2023-02-13 DIAGNOSIS — K589 Irritable bowel syndrome without diarrhea: Secondary | ICD-10-CM | POA: Diagnosis not present

## 2023-02-13 DIAGNOSIS — J441 Chronic obstructive pulmonary disease with (acute) exacerbation: Secondary | ICD-10-CM | POA: Diagnosis not present

## 2023-02-13 DIAGNOSIS — J189 Pneumonia, unspecified organism: Secondary | ICD-10-CM | POA: Diagnosis not present

## 2023-02-13 DIAGNOSIS — G9332 Myalgic encephalomyelitis/chronic fatigue syndrome: Secondary | ICD-10-CM | POA: Diagnosis not present

## 2023-02-13 DIAGNOSIS — G4733 Obstructive sleep apnea (adult) (pediatric): Secondary | ICD-10-CM | POA: Diagnosis not present

## 2023-02-13 DIAGNOSIS — J309 Allergic rhinitis, unspecified: Secondary | ICD-10-CM | POA: Diagnosis not present

## 2023-02-13 DIAGNOSIS — G4700 Insomnia, unspecified: Secondary | ICD-10-CM | POA: Diagnosis not present

## 2023-02-13 DIAGNOSIS — E8721 Acute metabolic acidosis: Secondary | ICD-10-CM | POA: Diagnosis not present

## 2023-02-13 DIAGNOSIS — I5032 Chronic diastolic (congestive) heart failure: Secondary | ICD-10-CM | POA: Diagnosis not present

## 2023-02-13 DIAGNOSIS — I110 Hypertensive heart disease with heart failure: Secondary | ICD-10-CM | POA: Diagnosis not present

## 2023-02-13 DIAGNOSIS — R7303 Prediabetes: Secondary | ICD-10-CM | POA: Diagnosis not present

## 2023-02-13 DIAGNOSIS — G43909 Migraine, unspecified, not intractable, without status migrainosus: Secondary | ICD-10-CM | POA: Diagnosis not present

## 2023-02-13 DIAGNOSIS — R471 Dysarthria and anarthria: Secondary | ICD-10-CM | POA: Diagnosis not present

## 2023-02-13 DIAGNOSIS — R339 Retention of urine, unspecified: Secondary | ICD-10-CM | POA: Diagnosis not present

## 2023-02-13 DIAGNOSIS — M5030 Other cervical disc degeneration, unspecified cervical region: Secondary | ICD-10-CM | POA: Diagnosis not present

## 2023-02-13 DIAGNOSIS — J440 Chronic obstructive pulmonary disease with acute lower respiratory infection: Secondary | ICD-10-CM | POA: Diagnosis not present

## 2023-02-13 DIAGNOSIS — J4489 Other specified chronic obstructive pulmonary disease: Secondary | ICD-10-CM | POA: Diagnosis not present

## 2023-02-18 DIAGNOSIS — R471 Dysarthria and anarthria: Secondary | ICD-10-CM | POA: Diagnosis not present

## 2023-02-18 DIAGNOSIS — M797 Fibromyalgia: Secondary | ICD-10-CM | POA: Diagnosis not present

## 2023-02-18 DIAGNOSIS — I110 Hypertensive heart disease with heart failure: Secondary | ICD-10-CM | POA: Diagnosis not present

## 2023-02-18 DIAGNOSIS — F419 Anxiety disorder, unspecified: Secondary | ICD-10-CM | POA: Diagnosis not present

## 2023-02-18 DIAGNOSIS — F32A Depression, unspecified: Secondary | ICD-10-CM | POA: Diagnosis not present

## 2023-02-18 DIAGNOSIS — J440 Chronic obstructive pulmonary disease with acute lower respiratory infection: Secondary | ICD-10-CM | POA: Diagnosis not present

## 2023-02-18 DIAGNOSIS — G4733 Obstructive sleep apnea (adult) (pediatric): Secondary | ICD-10-CM | POA: Diagnosis not present

## 2023-02-18 DIAGNOSIS — G43909 Migraine, unspecified, not intractable, without status migrainosus: Secondary | ICD-10-CM | POA: Diagnosis not present

## 2023-02-18 DIAGNOSIS — D72829 Elevated white blood cell count, unspecified: Secondary | ICD-10-CM | POA: Diagnosis not present

## 2023-02-18 DIAGNOSIS — G894 Chronic pain syndrome: Secondary | ICD-10-CM | POA: Diagnosis not present

## 2023-02-18 DIAGNOSIS — E039 Hypothyroidism, unspecified: Secondary | ICD-10-CM | POA: Diagnosis not present

## 2023-02-18 DIAGNOSIS — J441 Chronic obstructive pulmonary disease with (acute) exacerbation: Secondary | ICD-10-CM | POA: Diagnosis not present

## 2023-02-18 DIAGNOSIS — I5032 Chronic diastolic (congestive) heart failure: Secondary | ICD-10-CM | POA: Diagnosis not present

## 2023-02-18 DIAGNOSIS — J309 Allergic rhinitis, unspecified: Secondary | ICD-10-CM | POA: Diagnosis not present

## 2023-02-18 DIAGNOSIS — M5030 Other cervical disc degeneration, unspecified cervical region: Secondary | ICD-10-CM | POA: Diagnosis not present

## 2023-02-18 DIAGNOSIS — G4700 Insomnia, unspecified: Secondary | ICD-10-CM | POA: Diagnosis not present

## 2023-02-18 DIAGNOSIS — E8721 Acute metabolic acidosis: Secondary | ICD-10-CM | POA: Diagnosis not present

## 2023-02-18 DIAGNOSIS — R7303 Prediabetes: Secondary | ICD-10-CM | POA: Diagnosis not present

## 2023-02-18 DIAGNOSIS — B333 Retrovirus infections, not elsewhere classified: Secondary | ICD-10-CM | POA: Diagnosis not present

## 2023-02-18 DIAGNOSIS — J189 Pneumonia, unspecified organism: Secondary | ICD-10-CM | POA: Diagnosis not present

## 2023-02-18 DIAGNOSIS — J4489 Other specified chronic obstructive pulmonary disease: Secondary | ICD-10-CM | POA: Diagnosis not present

## 2023-02-18 DIAGNOSIS — E876 Hypokalemia: Secondary | ICD-10-CM | POA: Diagnosis not present

## 2023-02-18 DIAGNOSIS — K589 Irritable bowel syndrome without diarrhea: Secondary | ICD-10-CM | POA: Diagnosis not present

## 2023-02-18 DIAGNOSIS — G9332 Myalgic encephalomyelitis/chronic fatigue syndrome: Secondary | ICD-10-CM | POA: Diagnosis not present

## 2023-02-18 DIAGNOSIS — R339 Retention of urine, unspecified: Secondary | ICD-10-CM | POA: Diagnosis not present

## 2023-02-19 DIAGNOSIS — J309 Allergic rhinitis, unspecified: Secondary | ICD-10-CM | POA: Diagnosis not present

## 2023-02-19 DIAGNOSIS — I5032 Chronic diastolic (congestive) heart failure: Secondary | ICD-10-CM | POA: Diagnosis not present

## 2023-02-19 DIAGNOSIS — E039 Hypothyroidism, unspecified: Secondary | ICD-10-CM | POA: Diagnosis not present

## 2023-02-19 DIAGNOSIS — G894 Chronic pain syndrome: Secondary | ICD-10-CM | POA: Diagnosis not present

## 2023-02-19 DIAGNOSIS — K589 Irritable bowel syndrome without diarrhea: Secondary | ICD-10-CM | POA: Diagnosis not present

## 2023-02-19 DIAGNOSIS — J189 Pneumonia, unspecified organism: Secondary | ICD-10-CM | POA: Diagnosis not present

## 2023-02-19 DIAGNOSIS — F32A Depression, unspecified: Secondary | ICD-10-CM | POA: Diagnosis not present

## 2023-02-19 DIAGNOSIS — M5030 Other cervical disc degeneration, unspecified cervical region: Secondary | ICD-10-CM | POA: Diagnosis not present

## 2023-02-19 DIAGNOSIS — R471 Dysarthria and anarthria: Secondary | ICD-10-CM | POA: Diagnosis not present

## 2023-02-19 DIAGNOSIS — E8721 Acute metabolic acidosis: Secondary | ICD-10-CM | POA: Diagnosis not present

## 2023-02-19 DIAGNOSIS — D72829 Elevated white blood cell count, unspecified: Secondary | ICD-10-CM | POA: Diagnosis not present

## 2023-02-19 DIAGNOSIS — G43909 Migraine, unspecified, not intractable, without status migrainosus: Secondary | ICD-10-CM | POA: Diagnosis not present

## 2023-02-19 DIAGNOSIS — G9332 Myalgic encephalomyelitis/chronic fatigue syndrome: Secondary | ICD-10-CM | POA: Diagnosis not present

## 2023-02-19 DIAGNOSIS — E876 Hypokalemia: Secondary | ICD-10-CM | POA: Diagnosis not present

## 2023-02-19 DIAGNOSIS — G4733 Obstructive sleep apnea (adult) (pediatric): Secondary | ICD-10-CM | POA: Diagnosis not present

## 2023-02-19 DIAGNOSIS — F419 Anxiety disorder, unspecified: Secondary | ICD-10-CM | POA: Diagnosis not present

## 2023-02-19 DIAGNOSIS — I110 Hypertensive heart disease with heart failure: Secondary | ICD-10-CM | POA: Diagnosis not present

## 2023-02-19 DIAGNOSIS — J4489 Other specified chronic obstructive pulmonary disease: Secondary | ICD-10-CM | POA: Diagnosis not present

## 2023-02-19 DIAGNOSIS — R339 Retention of urine, unspecified: Secondary | ICD-10-CM | POA: Diagnosis not present

## 2023-02-19 DIAGNOSIS — M797 Fibromyalgia: Secondary | ICD-10-CM | POA: Diagnosis not present

## 2023-02-19 DIAGNOSIS — R7303 Prediabetes: Secondary | ICD-10-CM | POA: Diagnosis not present

## 2023-02-19 DIAGNOSIS — G4700 Insomnia, unspecified: Secondary | ICD-10-CM | POA: Diagnosis not present

## 2023-02-19 DIAGNOSIS — B333 Retrovirus infections, not elsewhere classified: Secondary | ICD-10-CM | POA: Diagnosis not present

## 2023-02-19 DIAGNOSIS — J440 Chronic obstructive pulmonary disease with acute lower respiratory infection: Secondary | ICD-10-CM | POA: Diagnosis not present

## 2023-02-19 DIAGNOSIS — J441 Chronic obstructive pulmonary disease with (acute) exacerbation: Secondary | ICD-10-CM | POA: Diagnosis not present

## 2023-02-21 DIAGNOSIS — J309 Allergic rhinitis, unspecified: Secondary | ICD-10-CM | POA: Diagnosis not present

## 2023-02-21 DIAGNOSIS — G4733 Obstructive sleep apnea (adult) (pediatric): Secondary | ICD-10-CM | POA: Diagnosis not present

## 2023-02-21 DIAGNOSIS — I5032 Chronic diastolic (congestive) heart failure: Secondary | ICD-10-CM | POA: Diagnosis not present

## 2023-02-21 DIAGNOSIS — R339 Retention of urine, unspecified: Secondary | ICD-10-CM | POA: Diagnosis not present

## 2023-02-21 DIAGNOSIS — R471 Dysarthria and anarthria: Secondary | ICD-10-CM | POA: Diagnosis not present

## 2023-02-21 DIAGNOSIS — G894 Chronic pain syndrome: Secondary | ICD-10-CM | POA: Diagnosis not present

## 2023-02-21 DIAGNOSIS — J4489 Other specified chronic obstructive pulmonary disease: Secondary | ICD-10-CM | POA: Diagnosis not present

## 2023-02-21 DIAGNOSIS — E8721 Acute metabolic acidosis: Secondary | ICD-10-CM | POA: Diagnosis not present

## 2023-02-21 DIAGNOSIS — E039 Hypothyroidism, unspecified: Secondary | ICD-10-CM | POA: Diagnosis not present

## 2023-02-21 DIAGNOSIS — F32A Depression, unspecified: Secondary | ICD-10-CM | POA: Diagnosis not present

## 2023-02-21 DIAGNOSIS — G43909 Migraine, unspecified, not intractable, without status migrainosus: Secondary | ICD-10-CM | POA: Diagnosis not present

## 2023-02-21 DIAGNOSIS — I110 Hypertensive heart disease with heart failure: Secondary | ICD-10-CM | POA: Diagnosis not present

## 2023-02-21 DIAGNOSIS — J441 Chronic obstructive pulmonary disease with (acute) exacerbation: Secondary | ICD-10-CM | POA: Diagnosis not present

## 2023-02-21 DIAGNOSIS — R7303 Prediabetes: Secondary | ICD-10-CM | POA: Diagnosis not present

## 2023-02-21 DIAGNOSIS — K589 Irritable bowel syndrome without diarrhea: Secondary | ICD-10-CM | POA: Diagnosis not present

## 2023-02-21 DIAGNOSIS — G4700 Insomnia, unspecified: Secondary | ICD-10-CM | POA: Diagnosis not present

## 2023-02-21 DIAGNOSIS — J189 Pneumonia, unspecified organism: Secondary | ICD-10-CM | POA: Diagnosis not present

## 2023-02-21 DIAGNOSIS — E876 Hypokalemia: Secondary | ICD-10-CM | POA: Diagnosis not present

## 2023-02-21 DIAGNOSIS — J440 Chronic obstructive pulmonary disease with acute lower respiratory infection: Secondary | ICD-10-CM | POA: Diagnosis not present

## 2023-02-21 DIAGNOSIS — B333 Retrovirus infections, not elsewhere classified: Secondary | ICD-10-CM | POA: Diagnosis not present

## 2023-02-21 DIAGNOSIS — D72829 Elevated white blood cell count, unspecified: Secondary | ICD-10-CM | POA: Diagnosis not present

## 2023-02-21 DIAGNOSIS — F419 Anxiety disorder, unspecified: Secondary | ICD-10-CM | POA: Diagnosis not present

## 2023-02-21 DIAGNOSIS — M797 Fibromyalgia: Secondary | ICD-10-CM | POA: Diagnosis not present

## 2023-02-21 DIAGNOSIS — M5030 Other cervical disc degeneration, unspecified cervical region: Secondary | ICD-10-CM | POA: Diagnosis not present

## 2023-02-21 DIAGNOSIS — G9332 Myalgic encephalomyelitis/chronic fatigue syndrome: Secondary | ICD-10-CM | POA: Diagnosis not present

## 2023-02-25 DIAGNOSIS — F32A Depression, unspecified: Secondary | ICD-10-CM | POA: Diagnosis not present

## 2023-02-25 DIAGNOSIS — B333 Retrovirus infections, not elsewhere classified: Secondary | ICD-10-CM | POA: Diagnosis not present

## 2023-02-25 DIAGNOSIS — E876 Hypokalemia: Secondary | ICD-10-CM | POA: Diagnosis not present

## 2023-02-25 DIAGNOSIS — D72829 Elevated white blood cell count, unspecified: Secondary | ICD-10-CM | POA: Diagnosis not present

## 2023-02-25 DIAGNOSIS — K589 Irritable bowel syndrome without diarrhea: Secondary | ICD-10-CM | POA: Diagnosis not present

## 2023-02-25 DIAGNOSIS — E039 Hypothyroidism, unspecified: Secondary | ICD-10-CM | POA: Diagnosis not present

## 2023-02-25 DIAGNOSIS — I5032 Chronic diastolic (congestive) heart failure: Secondary | ICD-10-CM | POA: Diagnosis not present

## 2023-02-25 DIAGNOSIS — J4489 Other specified chronic obstructive pulmonary disease: Secondary | ICD-10-CM | POA: Diagnosis not present

## 2023-02-25 DIAGNOSIS — G9332 Myalgic encephalomyelitis/chronic fatigue syndrome: Secondary | ICD-10-CM | POA: Diagnosis not present

## 2023-02-25 DIAGNOSIS — J189 Pneumonia, unspecified organism: Secondary | ICD-10-CM | POA: Diagnosis not present

## 2023-02-25 DIAGNOSIS — J309 Allergic rhinitis, unspecified: Secondary | ICD-10-CM | POA: Diagnosis not present

## 2023-02-25 DIAGNOSIS — M797 Fibromyalgia: Secondary | ICD-10-CM | POA: Diagnosis not present

## 2023-02-25 DIAGNOSIS — G4733 Obstructive sleep apnea (adult) (pediatric): Secondary | ICD-10-CM | POA: Diagnosis not present

## 2023-02-25 DIAGNOSIS — F419 Anxiety disorder, unspecified: Secondary | ICD-10-CM | POA: Diagnosis not present

## 2023-02-25 DIAGNOSIS — G894 Chronic pain syndrome: Secondary | ICD-10-CM | POA: Diagnosis not present

## 2023-02-25 DIAGNOSIS — R471 Dysarthria and anarthria: Secondary | ICD-10-CM | POA: Diagnosis not present

## 2023-02-25 DIAGNOSIS — R7303 Prediabetes: Secondary | ICD-10-CM | POA: Diagnosis not present

## 2023-02-25 DIAGNOSIS — I110 Hypertensive heart disease with heart failure: Secondary | ICD-10-CM | POA: Diagnosis not present

## 2023-02-25 DIAGNOSIS — J441 Chronic obstructive pulmonary disease with (acute) exacerbation: Secondary | ICD-10-CM | POA: Diagnosis not present

## 2023-02-25 DIAGNOSIS — J440 Chronic obstructive pulmonary disease with acute lower respiratory infection: Secondary | ICD-10-CM | POA: Diagnosis not present

## 2023-02-25 DIAGNOSIS — E8721 Acute metabolic acidosis: Secondary | ICD-10-CM | POA: Diagnosis not present

## 2023-02-25 DIAGNOSIS — G4700 Insomnia, unspecified: Secondary | ICD-10-CM | POA: Diagnosis not present

## 2023-02-25 DIAGNOSIS — M5030 Other cervical disc degeneration, unspecified cervical region: Secondary | ICD-10-CM | POA: Diagnosis not present

## 2023-02-25 DIAGNOSIS — R339 Retention of urine, unspecified: Secondary | ICD-10-CM | POA: Diagnosis not present

## 2023-02-25 DIAGNOSIS — G43909 Migraine, unspecified, not intractable, without status migrainosus: Secondary | ICD-10-CM | POA: Diagnosis not present

## 2023-02-26 ENCOUNTER — Telehealth: Payer: Self-pay

## 2023-02-26 NOTE — Telephone Encounter (Signed)
A rep from [facility] called  to get the Provider name who will sign off on paper work for pt ... I explained  that her pcp is Dr Annie Paras  but he has three attendings over him  .Marland Kitchen As I was about to give her the name she proceeds to cut me off asking for a  NPI  number of the Providers I explained I can't give that over the phone and I will give her the full names of the attendings as she again  cuts me off and  says she can easy look up the NPI  I told her that is fine but I can't give her that over the phone .... She them  says who is Statistician and my name I told her it is Derrell Lolling and  I will place her call to the voice mail  of my manager .Marland Kitchen She then cuts me off again and got rude staying that  she will  tell her manager  that I refused care for the patient .Marland Kitchen Because I wont give her the NPI  .Marland KitchenMarland Kitchen

## 2023-02-27 ENCOUNTER — Ambulatory Visit (INDEPENDENT_AMBULATORY_CARE_PROVIDER_SITE_OTHER): Payer: Medicare HMO | Admitting: Student

## 2023-02-27 ENCOUNTER — Encounter: Payer: Self-pay | Admitting: Student

## 2023-02-27 ENCOUNTER — Inpatient Hospital Stay (HOSPITAL_COMMUNITY)
Admission: RE | Admit: 2023-02-27 | Discharge: 2023-02-27 | Disposition: A | Discharge status: Home / Self Care | Payer: Medicare HMO | Source: Ambulatory Visit | Attending: Internal Medicine | Admitting: Internal Medicine

## 2023-02-27 DIAGNOSIS — R197 Diarrhea, unspecified: Secondary | ICD-10-CM

## 2023-02-27 HISTORY — DX: Diarrhea, unspecified: R19.7

## 2023-02-27 MED ORDER — LOPERAMIDE HCL 2 MG PO CAPS: 2.0000 mg | ORAL_CAPSULE | ORAL | 0 refills | Status: DC | PRN

## 2023-02-27 NOTE — Progress Notes (Signed)
   CC: Diarrhea   This is a telephone encounter between [employer] and Brianna Roberts on 02/27/2023 for concerns of diarrhea. The visit was conducted with the patient located at home and Brianna Roberts at [facility]. The patient's identity was confirmed using their DOB and current address. The patient has consented to being evaluated through a telephone encounter and understands the associated risks (an examination cannot be done and the patient may need to come in for an appointment) / benefits (allows the patient to remain at home, decreasing exposure to coronavirus). I personally spent 15 minutes on medical discussion.   HPI:  [name] is a 57 y.o. with PMH as below.   Please see A&P for assessment of the patient's acute and chronic medical conditions.   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  Patient reports having concerns of acute diarrhea.  She reports 2-day history of loose bowel movements.  She denies any bloody stools, rectal pain, or pus in her stool.  She states it is liquid and dark brown. Patient denies any black or tarry stools.  She states over the past 24 hours she has had 10 loose bowel movements.  She denies any fevers, chills, or any vomiting.  She does endorse that she recently had antibiotics.  Patient was discharged from a hospital stay with Augmentin.  Patient states she is able to stay hydrated, but at 10 minutes, after eating, she has to have a  bowel movement.  She denies any abdominal pain.  Unable to do physical exam as this was a telehealth visit.  Differential includes a viral gastroenteritis.  Does also include colitis.  Do not think this is related to C. difficile, but if persists, will need to obtain C. difficile testing.  This could be a IBS flare.  Will treat with supportive care at this time.  Plan: -Imodium written for patient -Referred to gastroenterology to reestablish for IBS -instructed patient to come in for appointment if does not resolve    Patient discussed with Dr. Letta Kocher, DO  Internal Medicine Resident PGY-2

## 2023-02-27 NOTE — Assessment & Plan Note (Signed)
Patient had telehealth visit.  Patient reports having concerns of acute diarrhea.  She reports 2-day history of loose bowel movements.  She denies any bloody stools, rectal pain, or pus in her stool.  She states it is liquid and dark brown. Patient denies any black or tarry stools.  She states over the past 24 hours she has had 10 loose bowel movements.  She denies any fevers, chills, or any vomiting.  She does endorse that she recently had antibiotics.  Patient was discharged from a hospital stay with Augmentin.  Patient states she is able to stay hydrated, but at 10 minutes, after eating, she has to have a bowel movement.  She denies any abdominal pain.  Unable to do physical exam as this was a telehealth visit.  Differential includes a viral gastroenteritis.  Does also include colitis.  Do not think this is related to C. difficile, but if persists, will need to obtain C. difficile testing.  This could be a IBS flare.  Will treat with supportive care at this time.  Plan: -Imodium written for patient -Referred to gastroenterology to reestablish for IBS -instructed patient to come in for appointment if does not resolve

## 2023-02-28 NOTE — Progress Notes (Signed)
Internal Medicine Clinic Attending  Case discussed with the resident physician at the time of the visit.  We reviewed the patient's history and pertinent patient test results.  I agree with the assessment, diagnosis, and plan of care documented in the resident's note.

## 2023-03-04 ENCOUNTER — Ambulatory Visit (HOSPITAL_COMMUNITY): Payer: Medicare HMO

## 2023-03-04 ENCOUNTER — Other Ambulatory Visit: Payer: Self-pay | Admitting: Student

## 2023-03-04 ENCOUNTER — Encounter (HOSPITAL_COMMUNITY): Payer: Self-pay

## 2023-03-04 DIAGNOSIS — E039 Hypothyroidism, unspecified: Secondary | ICD-10-CM

## 2023-03-04 DIAGNOSIS — J3089 Other allergic rhinitis: Secondary | ICD-10-CM

## 2023-03-04 DIAGNOSIS — L509 Urticaria, unspecified: Secondary | ICD-10-CM

## 2023-03-04 NOTE — Telephone Encounter (Signed)
Refill Reque[facility]  levothyroxine (SYNTHROID, LEVOTHROID) 50 MCG table   fluticasone (FLONASE) 50 MCG/ACT nasal spray  hydrOXYzine (ATARAX/VISTARIL) 10 MG tablet   [facility] - [address] [place] - [address] (Ph: [phone])  Order Details

## 2023-03-05 ENCOUNTER — Encounter: Payer: Self-pay | Admitting: Student

## 2023-03-06 ENCOUNTER — Inpatient Hospital Stay (HOSPITAL_COMMUNITY)
Admission: RE | Admit: 2023-03-06 | Discharge: 2023-03-06 | Disposition: A | Discharge status: Home / Self Care | Payer: Medicare HMO | Source: Ambulatory Visit | Attending: Internal Medicine | Admitting: Internal Medicine

## 2023-03-06 MED ORDER — HYDROXYZINE HCL 10 MG PO TABS
10.0000 mg | ORAL_TABLET | Freq: Two times a day (BID) | ORAL | 5 refills | Status: DC
Start: 2023-03-06 — End: 2023-04-04

## 2023-03-06 MED ORDER — LEVOTHYROXINE SODIUM 50 MCG PO TABS
50.0000 ug | ORAL_TABLET | Freq: Every day | ORAL | 3 refills | Status: AC
Start: 2023-03-06 — End: ?

## 2023-03-06 MED ORDER — FLUTICASONE PROPIONATE 50 MCG/ACT NA SUSP
1.0000 | Freq: Every day | NASAL | 0 refills | Status: AC
Start: 2023-03-06 — End: ?

## 2023-03-07 ENCOUNTER — Telehealth: Payer: Self-pay | Admitting: Student

## 2023-03-07 DIAGNOSIS — M797 Fibromyalgia: Secondary | ICD-10-CM | POA: Diagnosis not present

## 2023-03-07 DIAGNOSIS — G894 Chronic pain syndrome: Secondary | ICD-10-CM | POA: Diagnosis not present

## 2023-03-07 DIAGNOSIS — R471 Dysarthria and anarthria: Secondary | ICD-10-CM | POA: Diagnosis not present

## 2023-03-07 DIAGNOSIS — E039 Hypothyroidism, unspecified: Secondary | ICD-10-CM | POA: Diagnosis not present

## 2023-03-07 DIAGNOSIS — J309 Allergic rhinitis, unspecified: Secondary | ICD-10-CM | POA: Diagnosis not present

## 2023-03-07 DIAGNOSIS — G4733 Obstructive sleep apnea (adult) (pediatric): Secondary | ICD-10-CM | POA: Diagnosis not present

## 2023-03-07 DIAGNOSIS — J441 Chronic obstructive pulmonary disease with (acute) exacerbation: Secondary | ICD-10-CM | POA: Diagnosis not present

## 2023-03-07 DIAGNOSIS — J189 Pneumonia, unspecified organism: Secondary | ICD-10-CM | POA: Diagnosis not present

## 2023-03-07 DIAGNOSIS — E876 Hypokalemia: Secondary | ICD-10-CM | POA: Diagnosis not present

## 2023-03-07 DIAGNOSIS — R7303 Prediabetes: Secondary | ICD-10-CM | POA: Diagnosis not present

## 2023-03-07 DIAGNOSIS — J440 Chronic obstructive pulmonary disease with acute lower respiratory infection: Secondary | ICD-10-CM | POA: Diagnosis not present

## 2023-03-07 DIAGNOSIS — K589 Irritable bowel syndrome without diarrhea: Secondary | ICD-10-CM | POA: Diagnosis not present

## 2023-03-07 DIAGNOSIS — B333 Retrovirus infections, not elsewhere classified: Secondary | ICD-10-CM | POA: Diagnosis not present

## 2023-03-07 DIAGNOSIS — E8721 Acute metabolic acidosis: Secondary | ICD-10-CM | POA: Diagnosis not present

## 2023-03-07 DIAGNOSIS — D72829 Elevated white blood cell count, unspecified: Secondary | ICD-10-CM | POA: Diagnosis not present

## 2023-03-07 DIAGNOSIS — F32A Depression, unspecified: Secondary | ICD-10-CM | POA: Diagnosis not present

## 2023-03-07 DIAGNOSIS — G4700 Insomnia, unspecified: Secondary | ICD-10-CM | POA: Diagnosis not present

## 2023-03-07 DIAGNOSIS — F419 Anxiety disorder, unspecified: Secondary | ICD-10-CM | POA: Diagnosis not present

## 2023-03-07 DIAGNOSIS — G43909 Migraine, unspecified, not intractable, without status migrainosus: Secondary | ICD-10-CM | POA: Diagnosis not present

## 2023-03-07 DIAGNOSIS — I110 Hypertensive heart disease with heart failure: Secondary | ICD-10-CM | POA: Diagnosis not present

## 2023-03-07 DIAGNOSIS — M5030 Other cervical disc degeneration, unspecified cervical region: Secondary | ICD-10-CM | POA: Diagnosis not present

## 2023-03-07 DIAGNOSIS — R339 Retention of urine, unspecified: Secondary | ICD-10-CM | POA: Diagnosis not present

## 2023-03-07 DIAGNOSIS — G9332 Myalgic encephalomyelitis/chronic fatigue syndrome: Secondary | ICD-10-CM | POA: Diagnosis not present

## 2023-03-07 DIAGNOSIS — I5032 Chronic diastolic (congestive) heart failure: Secondary | ICD-10-CM | POA: Diagnosis not present

## 2023-03-07 DIAGNOSIS — J4489 Other specified chronic obstructive pulmonary disease: Secondary | ICD-10-CM | POA: Diagnosis not present

## 2023-03-07 NOTE — Telephone Encounter (Signed)
Patient is requesting an appointment.    Thank you  Brianna Roberts [facility] AWV TEAM Direct Dial [phone]

## 2023-03-11 DIAGNOSIS — D72829 Elevated white blood cell count, unspecified: Secondary | ICD-10-CM | POA: Diagnosis not present

## 2023-03-11 DIAGNOSIS — M797 Fibromyalgia: Secondary | ICD-10-CM | POA: Diagnosis not present

## 2023-03-11 DIAGNOSIS — J440 Chronic obstructive pulmonary disease with acute lower respiratory infection: Secondary | ICD-10-CM | POA: Diagnosis not present

## 2023-03-11 DIAGNOSIS — M5030 Other cervical disc degeneration, unspecified cervical region: Secondary | ICD-10-CM | POA: Diagnosis not present

## 2023-03-11 DIAGNOSIS — G4700 Insomnia, unspecified: Secondary | ICD-10-CM | POA: Diagnosis not present

## 2023-03-11 DIAGNOSIS — J309 Allergic rhinitis, unspecified: Secondary | ICD-10-CM | POA: Diagnosis not present

## 2023-03-11 DIAGNOSIS — K589 Irritable bowel syndrome without diarrhea: Secondary | ICD-10-CM | POA: Diagnosis not present

## 2023-03-11 DIAGNOSIS — F419 Anxiety disorder, unspecified: Secondary | ICD-10-CM | POA: Diagnosis not present

## 2023-03-11 DIAGNOSIS — G43909 Migraine, unspecified, not intractable, without status migrainosus: Secondary | ICD-10-CM | POA: Diagnosis not present

## 2023-03-11 DIAGNOSIS — J4489 Other specified chronic obstructive pulmonary disease: Secondary | ICD-10-CM | POA: Diagnosis not present

## 2023-03-11 DIAGNOSIS — R471 Dysarthria and anarthria: Secondary | ICD-10-CM | POA: Diagnosis not present

## 2023-03-11 DIAGNOSIS — J441 Chronic obstructive pulmonary disease with (acute) exacerbation: Secondary | ICD-10-CM | POA: Diagnosis not present

## 2023-03-11 DIAGNOSIS — R7303 Prediabetes: Secondary | ICD-10-CM | POA: Diagnosis not present

## 2023-03-11 DIAGNOSIS — I110 Hypertensive heart disease with heart failure: Secondary | ICD-10-CM | POA: Diagnosis not present

## 2023-03-11 DIAGNOSIS — G9332 Myalgic encephalomyelitis/chronic fatigue syndrome: Secondary | ICD-10-CM | POA: Diagnosis not present

## 2023-03-11 DIAGNOSIS — J189 Pneumonia, unspecified organism: Secondary | ICD-10-CM | POA: Diagnosis not present

## 2023-03-11 DIAGNOSIS — B333 Retrovirus infections, not elsewhere classified: Secondary | ICD-10-CM | POA: Diagnosis not present

## 2023-03-11 DIAGNOSIS — I5032 Chronic diastolic (congestive) heart failure: Secondary | ICD-10-CM | POA: Diagnosis not present

## 2023-03-11 DIAGNOSIS — R339 Retention of urine, unspecified: Secondary | ICD-10-CM | POA: Diagnosis not present

## 2023-03-11 DIAGNOSIS — E8721 Acute metabolic acidosis: Secondary | ICD-10-CM | POA: Diagnosis not present

## 2023-03-11 DIAGNOSIS — F32A Depression, unspecified: Secondary | ICD-10-CM | POA: Diagnosis not present

## 2023-03-11 DIAGNOSIS — E876 Hypokalemia: Secondary | ICD-10-CM | POA: Diagnosis not present

## 2023-03-11 DIAGNOSIS — G894 Chronic pain syndrome: Secondary | ICD-10-CM | POA: Diagnosis not present

## 2023-03-11 DIAGNOSIS — E039 Hypothyroidism, unspecified: Secondary | ICD-10-CM | POA: Diagnosis not present

## 2023-03-11 DIAGNOSIS — G4733 Obstructive sleep apnea (adult) (pediatric): Secondary | ICD-10-CM | POA: Diagnosis not present

## 2023-03-14 DIAGNOSIS — J440 Chronic obstructive pulmonary disease with acute lower respiratory infection: Secondary | ICD-10-CM | POA: Diagnosis not present

## 2023-03-14 DIAGNOSIS — M797 Fibromyalgia: Secondary | ICD-10-CM | POA: Diagnosis not present

## 2023-03-14 DIAGNOSIS — I5032 Chronic diastolic (congestive) heart failure: Secondary | ICD-10-CM | POA: Diagnosis not present

## 2023-03-14 DIAGNOSIS — E039 Hypothyroidism, unspecified: Secondary | ICD-10-CM | POA: Diagnosis not present

## 2023-03-14 DIAGNOSIS — J189 Pneumonia, unspecified organism: Secondary | ICD-10-CM | POA: Diagnosis not present

## 2023-03-14 DIAGNOSIS — B333 Retrovirus infections, not elsewhere classified: Secondary | ICD-10-CM | POA: Diagnosis not present

## 2023-03-14 DIAGNOSIS — I110 Hypertensive heart disease with heart failure: Secondary | ICD-10-CM | POA: Diagnosis not present

## 2023-03-14 DIAGNOSIS — F32A Depression, unspecified: Secondary | ICD-10-CM | POA: Diagnosis not present

## 2023-03-14 DIAGNOSIS — E876 Hypokalemia: Secondary | ICD-10-CM | POA: Diagnosis not present

## 2023-03-14 DIAGNOSIS — G4700 Insomnia, unspecified: Secondary | ICD-10-CM | POA: Diagnosis not present

## 2023-03-14 DIAGNOSIS — R471 Dysarthria and anarthria: Secondary | ICD-10-CM | POA: Diagnosis not present

## 2023-03-14 DIAGNOSIS — E8721 Acute metabolic acidosis: Secondary | ICD-10-CM | POA: Diagnosis not present

## 2023-03-14 DIAGNOSIS — R339 Retention of urine, unspecified: Secondary | ICD-10-CM | POA: Diagnosis not present

## 2023-03-14 DIAGNOSIS — J441 Chronic obstructive pulmonary disease with (acute) exacerbation: Secondary | ICD-10-CM | POA: Diagnosis not present

## 2023-03-14 DIAGNOSIS — G9332 Myalgic encephalomyelitis/chronic fatigue syndrome: Secondary | ICD-10-CM | POA: Diagnosis not present

## 2023-03-14 DIAGNOSIS — G4733 Obstructive sleep apnea (adult) (pediatric): Secondary | ICD-10-CM | POA: Diagnosis not present

## 2023-03-14 DIAGNOSIS — G894 Chronic pain syndrome: Secondary | ICD-10-CM | POA: Diagnosis not present

## 2023-03-14 DIAGNOSIS — J309 Allergic rhinitis, unspecified: Secondary | ICD-10-CM | POA: Diagnosis not present

## 2023-03-14 DIAGNOSIS — R7303 Prediabetes: Secondary | ICD-10-CM | POA: Diagnosis not present

## 2023-03-14 DIAGNOSIS — D72829 Elevated white blood cell count, unspecified: Secondary | ICD-10-CM | POA: Diagnosis not present

## 2023-03-14 DIAGNOSIS — J4489 Other specified chronic obstructive pulmonary disease: Secondary | ICD-10-CM | POA: Diagnosis not present

## 2023-03-14 DIAGNOSIS — M5030 Other cervical disc degeneration, unspecified cervical region: Secondary | ICD-10-CM | POA: Diagnosis not present

## 2023-03-14 DIAGNOSIS — K589 Irritable bowel syndrome without diarrhea: Secondary | ICD-10-CM | POA: Diagnosis not present

## 2023-03-14 DIAGNOSIS — F419 Anxiety disorder, unspecified: Secondary | ICD-10-CM | POA: Diagnosis not present

## 2023-03-14 DIAGNOSIS — G43909 Migraine, unspecified, not intractable, without status migrainosus: Secondary | ICD-10-CM | POA: Diagnosis not present

## 2023-03-18 DIAGNOSIS — G9332 Myalgic encephalomyelitis/chronic fatigue syndrome: Secondary | ICD-10-CM | POA: Diagnosis not present

## 2023-03-18 DIAGNOSIS — J309 Allergic rhinitis, unspecified: Secondary | ICD-10-CM | POA: Diagnosis not present

## 2023-03-18 DIAGNOSIS — E876 Hypokalemia: Secondary | ICD-10-CM | POA: Diagnosis not present

## 2023-03-18 DIAGNOSIS — F419 Anxiety disorder, unspecified: Secondary | ICD-10-CM | POA: Diagnosis not present

## 2023-03-18 DIAGNOSIS — G4733 Obstructive sleep apnea (adult) (pediatric): Secondary | ICD-10-CM | POA: Diagnosis not present

## 2023-03-18 DIAGNOSIS — R7303 Prediabetes: Secondary | ICD-10-CM | POA: Diagnosis not present

## 2023-03-18 DIAGNOSIS — J4489 Other specified chronic obstructive pulmonary disease: Secondary | ICD-10-CM | POA: Diagnosis not present

## 2023-03-18 DIAGNOSIS — I5032 Chronic diastolic (congestive) heart failure: Secondary | ICD-10-CM | POA: Diagnosis not present

## 2023-03-18 DIAGNOSIS — G4700 Insomnia, unspecified: Secondary | ICD-10-CM | POA: Diagnosis not present

## 2023-03-18 DIAGNOSIS — K589 Irritable bowel syndrome without diarrhea: Secondary | ICD-10-CM | POA: Diagnosis not present

## 2023-03-18 DIAGNOSIS — M797 Fibromyalgia: Secondary | ICD-10-CM | POA: Diagnosis not present

## 2023-03-18 DIAGNOSIS — D72829 Elevated white blood cell count, unspecified: Secondary | ICD-10-CM | POA: Diagnosis not present

## 2023-03-18 DIAGNOSIS — B333 Retrovirus infections, not elsewhere classified: Secondary | ICD-10-CM | POA: Diagnosis not present

## 2023-03-18 DIAGNOSIS — E8721 Acute metabolic acidosis: Secondary | ICD-10-CM | POA: Diagnosis not present

## 2023-03-18 DIAGNOSIS — G43909 Migraine, unspecified, not intractable, without status migrainosus: Secondary | ICD-10-CM | POA: Diagnosis not present

## 2023-03-18 DIAGNOSIS — J440 Chronic obstructive pulmonary disease with acute lower respiratory infection: Secondary | ICD-10-CM | POA: Diagnosis not present

## 2023-03-18 DIAGNOSIS — J189 Pneumonia, unspecified organism: Secondary | ICD-10-CM | POA: Diagnosis not present

## 2023-03-18 DIAGNOSIS — R339 Retention of urine, unspecified: Secondary | ICD-10-CM | POA: Diagnosis not present

## 2023-03-18 DIAGNOSIS — I110 Hypertensive heart disease with heart failure: Secondary | ICD-10-CM | POA: Diagnosis not present

## 2023-03-18 DIAGNOSIS — G894 Chronic pain syndrome: Secondary | ICD-10-CM | POA: Diagnosis not present

## 2023-03-18 DIAGNOSIS — R471 Dysarthria and anarthria: Secondary | ICD-10-CM | POA: Diagnosis not present

## 2023-03-18 DIAGNOSIS — F32A Depression, unspecified: Secondary | ICD-10-CM | POA: Diagnosis not present

## 2023-03-18 DIAGNOSIS — E039 Hypothyroidism, unspecified: Secondary | ICD-10-CM | POA: Diagnosis not present

## 2023-03-18 DIAGNOSIS — J441 Chronic obstructive pulmonary disease with (acute) exacerbation: Secondary | ICD-10-CM | POA: Diagnosis not present

## 2023-03-18 DIAGNOSIS — M5030 Other cervical disc degeneration, unspecified cervical region: Secondary | ICD-10-CM | POA: Diagnosis not present

## 2023-03-20 DIAGNOSIS — M797 Fibromyalgia: Secondary | ICD-10-CM | POA: Diagnosis not present

## 2023-03-20 DIAGNOSIS — B333 Retrovirus infections, not elsewhere classified: Secondary | ICD-10-CM | POA: Diagnosis not present

## 2023-03-20 DIAGNOSIS — R471 Dysarthria and anarthria: Secondary | ICD-10-CM | POA: Diagnosis not present

## 2023-03-20 DIAGNOSIS — G43909 Migraine, unspecified, not intractable, without status migrainosus: Secondary | ICD-10-CM | POA: Diagnosis not present

## 2023-03-20 DIAGNOSIS — G4700 Insomnia, unspecified: Secondary | ICD-10-CM | POA: Diagnosis not present

## 2023-03-20 DIAGNOSIS — K589 Irritable bowel syndrome without diarrhea: Secondary | ICD-10-CM | POA: Diagnosis not present

## 2023-03-20 DIAGNOSIS — J441 Chronic obstructive pulmonary disease with (acute) exacerbation: Secondary | ICD-10-CM | POA: Diagnosis not present

## 2023-03-20 DIAGNOSIS — J189 Pneumonia, unspecified organism: Secondary | ICD-10-CM | POA: Diagnosis not present

## 2023-03-20 DIAGNOSIS — G894 Chronic pain syndrome: Secondary | ICD-10-CM | POA: Diagnosis not present

## 2023-03-20 DIAGNOSIS — I5032 Chronic diastolic (congestive) heart failure: Secondary | ICD-10-CM | POA: Diagnosis not present

## 2023-03-20 DIAGNOSIS — R7303 Prediabetes: Secondary | ICD-10-CM | POA: Diagnosis not present

## 2023-03-20 DIAGNOSIS — F32A Depression, unspecified: Secondary | ICD-10-CM | POA: Diagnosis not present

## 2023-03-20 DIAGNOSIS — G4733 Obstructive sleep apnea (adult) (pediatric): Secondary | ICD-10-CM | POA: Diagnosis not present

## 2023-03-20 DIAGNOSIS — I110 Hypertensive heart disease with heart failure: Secondary | ICD-10-CM | POA: Diagnosis not present

## 2023-03-20 DIAGNOSIS — J4489 Other specified chronic obstructive pulmonary disease: Secondary | ICD-10-CM | POA: Diagnosis not present

## 2023-03-20 DIAGNOSIS — E8721 Acute metabolic acidosis: Secondary | ICD-10-CM | POA: Diagnosis not present

## 2023-03-20 DIAGNOSIS — E039 Hypothyroidism, unspecified: Secondary | ICD-10-CM | POA: Diagnosis not present

## 2023-03-20 DIAGNOSIS — J440 Chronic obstructive pulmonary disease with acute lower respiratory infection: Secondary | ICD-10-CM | POA: Diagnosis not present

## 2023-03-20 DIAGNOSIS — J309 Allergic rhinitis, unspecified: Secondary | ICD-10-CM | POA: Diagnosis not present

## 2023-03-20 DIAGNOSIS — F419 Anxiety disorder, unspecified: Secondary | ICD-10-CM | POA: Diagnosis not present

## 2023-03-20 DIAGNOSIS — G9332 Myalgic encephalomyelitis/chronic fatigue syndrome: Secondary | ICD-10-CM | POA: Diagnosis not present

## 2023-03-20 DIAGNOSIS — D72829 Elevated white blood cell count, unspecified: Secondary | ICD-10-CM | POA: Diagnosis not present

## 2023-03-20 DIAGNOSIS — M5030 Other cervical disc degeneration, unspecified cervical region: Secondary | ICD-10-CM | POA: Diagnosis not present

## 2023-03-20 DIAGNOSIS — R339 Retention of urine, unspecified: Secondary | ICD-10-CM | POA: Diagnosis not present

## 2023-03-20 DIAGNOSIS — E876 Hypokalemia: Secondary | ICD-10-CM | POA: Diagnosis not present

## 2023-03-26 ENCOUNTER — Encounter: Payer: Self-pay | Admitting: Student

## 2023-03-26 ENCOUNTER — Ambulatory Visit: Payer: Medicare HMO | Admitting: Student

## 2023-03-26 VITALS — BP 125/59 | HR 68 | Temp 98.3°F | Ht 69.0 in | Wt 365.3 lb

## 2023-03-26 DIAGNOSIS — R197 Diarrhea, unspecified: Secondary | ICD-10-CM | POA: Diagnosis not present

## 2023-03-26 DIAGNOSIS — K529 Noninfective gastroenteritis and colitis, unspecified: Secondary | ICD-10-CM | POA: Diagnosis not present

## 2023-03-26 DIAGNOSIS — E119 Type 2 diabetes mellitus without complications: Secondary | ICD-10-CM | POA: Diagnosis not present

## 2023-03-26 DIAGNOSIS — R609 Edema, unspecified: Secondary | ICD-10-CM | POA: Diagnosis not present

## 2023-03-26 DIAGNOSIS — I10 Essential (primary) hypertension: Secondary | ICD-10-CM

## 2023-03-26 DIAGNOSIS — G4733 Obstructive sleep apnea (adult) (pediatric): Secondary | ICD-10-CM | POA: Diagnosis not present

## 2023-03-26 DIAGNOSIS — J3089 Other allergic rhinitis: Secondary | ICD-10-CM

## 2023-03-26 DIAGNOSIS — R7303 Prediabetes: Secondary | ICD-10-CM

## 2023-03-26 MED ORDER — METOPROLOL TARTRATE 100 MG PO TABS: 100.0000 mg | ORAL_TABLET | Freq: Two times a day (BID) | ORAL | 3 refills | Status: AC

## 2023-03-26 MED ORDER — VITAMIN B-12 100 MCG PO TABS: 100.0000 ug | ORAL_TABLET | ORAL | 1 refills | Status: DC

## 2023-03-26 MED ORDER — FLUTICASONE PROPIONATE 50 MCG/ACT NA SUSP
1.0000 | Freq: Every day | NASAL | 11 refills | Status: AC
Start: 2023-03-26 — End: ?

## 2023-03-26 MED ORDER — VALACYCLOVIR HCL 1 G PO TABS: 1000.0000 mg | ORAL_TABLET | Freq: Every day | ORAL | 3 refills | Status: AC

## 2023-03-26 MED ORDER — AMLODIPINE BESYLATE 10 MG PO TABS
10.0000 mg | ORAL_TABLET | Freq: Every day | ORAL | 3 refills | Status: AC
Start: 2023-03-26 — End: ?

## 2023-03-26 MED ORDER — TRELEGY ELLIPTA 200-62.5-25 MCG/ACT IN AEPB: 1.0000 | INHALATION_SPRAY | Freq: Every day | RESPIRATORY_TRACT | 1 refills | Status: DC

## 2023-03-26 MED ORDER — NYSTATIN 100000 UNIT/GM EX CREA: 1.0000 | TOPICAL_CREAM | Freq: Two times a day (BID) | CUTANEOUS | 11 refills | Status: AC

## 2023-03-26 MED ORDER — FUROSEMIDE 40 MG PO TABS: 40.0000 mg | ORAL_TABLET | Freq: Two times a day (BID) | ORAL | 11 refills | Status: DC

## 2023-03-26 NOTE — Patient Instructions (Addendum)
  Thank you, Ms.Ahmari A Flink, for allowing [place] to provide your care today.  I have sent in refills of the medications that we discussed today.  If there are any that were missed please send [place] a message through MyChart or call our office.  We will send some stool studies today to check for any infections that may be causing your diarrhea.  I will call you with these results.  We will also work on [facility], try to figure out coverage for that diet, and see if we can order the CT scan.  When I call you about your lab results hopefully will have some answers about those 3 things.   I have ordered the following labs for you:  Lab Orders         GI Profile, Stool, PCR- [facility]         CMP14 + Anion Gap         TSH         Tissue Transglutaminase Abs,IgG,IgA       Referrals ordered today:   Referral Orders  No referral(s) requested today      I have ordered the following medication/changed the following medications:    Meds ordered this encounter  Medications   vitamin B-12 (CYANOCOBALAMIN) 100 MCG tablet    Sig: Take 1 tablet (100 mcg total) by mouth once a week.    Dispense:  52 tablet    Refill:  1   amLODipine (NORVASC) 10 MG tablet    Sig: Take 1 tablet (10 mg total) by mouth daily.    Dispense:  90 tablet    Refill:  3    Please cancel remaining fills of 5 mg.   fluticasone (FLONASE) 50 MCG/ACT nasal spray    Sig: Place 1 spray into both nostrils daily.    Dispense:  16 g    Refill:  11    Please take 1 spray in each nostril daily   furosemide (LASIX) 40 MG tablet    Sig: Take 1 tablet (40 mg total) by mouth 2 (two) times daily.    Dispense:  60 tablet    Refill:  11   Fluticasone-Umeclidin-Vilant (TRELEGY ELLIPTA) 200-62.5-25 MCG/ACT AEPB    Sig: Inhale 1 Inhalation into the lungs daily at 6 (six) AM.    Dispense:  60 each    Refill:  1   metoprolol tartrate (LOPRESSOR) 100 MG tablet    Sig: Take 1 tablet (100 mg total) by mouth 2 (two) times daily.     Dispense:  180 tablet    Refill:  3   nystatin cream (MYCOSTATIN)    Sig: Apply 1 Application topically 2 (two) times daily.    Dispense:  30 g    Refill:  11   valACYclovir (VALTREX) 1000 MG tablet    Sig: Take 1 tablet (1,000 mg total) by mouth daily.    Dispense:  30 tablet    Refill:  3      Follow up: 2 months    Remember:     Should you have any questions or concerns please call the internal medicine clinic at [phone].     Rocky Morel, DO [facility]

## 2023-03-26 NOTE — Progress Notes (Unsigned)
CC: Routine Follow Up   HPI:  Brianna Roberts is a 57 y.o. female with pertinent PMH of HTN, COPD, OSA, hypothyroidism, IBS, chronic fatigue, fibromyalgia, history of HTLV-1, anxiety/depression, and type 2 diabetes who presents to the clinic for a follow-up visit with acute concerns of continued bothersome diarrhea. Please see assessment and plan below for further details.  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    Concussion    DDD (degenerative disc disease), cervical    Depression    Fibromyalgia    HIV (human immunodeficiency virus infection) (HCC)    Homelessness    Hypertension    Hypothyroidism    Insomnia    Neuromuscular disorder (HCC)    Thyroid disease    TIA (transient ischemic attack)     Current Outpatient Medications  Medication Instructions   acetaminophen (TYLENOL) 650 mg, Oral, Every 6 hours PRN   albuterol (VENTOLIN HFA) 108 (90 Base) MCG/ACT inhaler 1-2 puffs, Inhalation, Every 6 hours PRN   amitriptyline (ELAVIL) 100 mg, Oral, Daily at bedtime   amLODipine (NORVASC) 10 mg, Oral, Daily   ASPERCREME LIDOCAINE EX 1 application , 2 times daily PRN   azelastine (ASTELIN) 0.1 % nasal spray USE 2 SPRAYS EACH NOSTRIL TWICE DAILY.   Blood Glucose Monitoring Suppl (ONETOUCH VERIO) w/Device KIT 1 each, Does not apply, As needed   clobetasol ointment (TEMOVATE) 0.05 % 1 application , Topical, As needed   diazepam (VALIUM) 5 mg, Oral, Every 8 hours PRN   fluticasone (FLONASE) 50 MCG/ACT nasal spray 1 spray, Each Nare, Daily   Fluticasone-Umeclidin-Vilant (TRELEGY ELLIPTA) 200-62.5-25 MCG/ACT AEPB 1 Inhalation, Inhalation, Daily   furosemide (LASIX) 40 mg, Oral, 2 times daily   glucose blood test strip Use as instructed   hydrOXYzine (ATARAX) 10 mg, Oral, 2 times daily   Incontinence Supply Disposable (PREVAIL WASHCLOTHS) MISC Use daily as directed    Lancets Thin MISC Use to check home glucose daily   levothyroxine (SYNTHROID) 50 mcg, Oral, Daily before breakfast   loperamide (IMODIUM) 2 mg, Oral, As needed   metoprolol tartrate (LOPRESSOR) 100 mg, Oral, 2 times daily   Nerve Stimulator (PRO COMFORT TENS UNIT) DEVI 1 Device, Does not apply, Daily PRN   nystatin cream (MYCOSTATIN) 1 Application, Topical, 2 times daily   ondansetron (ZOFRAN-ODT) 8 mg, Oral, Every 8 hours PRN   potassium chloride SA (KLOR-CON M) 20 MEQ tablet 20 mEq, Oral, 2 times daily   rosuvastatin (CRESTOR) 5 mg, Oral, Daily   Skin Protectants, Misc. (EUCERIN) cream Topical, As needed   triamcinolone ointment (KENALOG) 0.5 % 1 application , Topical, Daily   valACYclovir (VALTREX) 1,000 mg, Oral, Daily   vitamin B-12 (CYANOCOBALAMIN) 100 mcg, Oral, Weekly   Vitamin D (Ergocalciferol) (DRISDOL) 50,000 Units, Oral, Weekly, On Saturday     Review of Systems:   Pertinent items noted in HPI and/or A&P.  Physical Exam:  Vitals:   03/26/23 0917  BP: (!) 125/59  Pulse: 68  Temp: 98.3 F (36.8 C)  TempSrc: Oral  SpO2: 96%  Weight: (!) 365 lb 4.8 oz (165.7 kg)  Height: 5\' 9"  (1.753 m)    Constitutional: Well-appearing middle-aged female. In no acute distress. HEENT: Normocephalic, atraumatic, Sclera non-icteric, PERRL, EOM intact Cardio:Regular rate and rhythm. 2+ bilateral radial pulses. Pulm:Clear to auscultation bilaterally. Normal work of breathing on room air.  Abdomen: Soft, mildly tender diffusely, non-distended, positive bowel sounds. ZOX:WRUEAVWU for extremity edema. Skin:Warm and dry. Neuro:Alert and oriented x3. No focal deficit noted. Psych:Pleasant mood and affect.   Assessment & Plan:   Type 2 diabetes mellitus without complication, without long-term current use of insulin (HCC) A1c during recent hospitalization of 6.5 and repeat today up to 6.9.  She has a glucometer at home.  She will bring this in to her next visit.  I do not think that she  would be a good candidate for metformin at this time due to her issues with chronic diarrhea. She has previously done well with a low-sodium and diabetic meals from [employer].  It had been set up in the past by her Hotel manager.  She saw a decrease in her A1c to 6.1 while she had these meals.  I believe that she would benefit from these meals again and we will work with her and her insurance to obtain them. - We will continue with lifestyle modification with the above diet and provided meals - Repeat A1c in 3 months and consider GLP-1 at that time  OSA (obstructive sleep apnea) After visit she requested a new order for CPAP.  I have put in a referral for DME for CPAP machine from her previous home health company [facility].  Parotid swelling Patient was recently seen by ENT for right-sided parotid swelling.  At that visit her ENT ordered a CT to further evaluate but her co-pay is too high.  We will reorder the CT to further evaluate her right-sided facial swelling that is possibly chronic parotitis. - CT of the soft tissues of the neck with contrast  Diarrhea Patient last seen for a telehealth visit on 02/27/2023 with acute diarrhea.  She was trialed on as needed Imodium but this has not improved her symptoms.  She is having soft brown or watery bowel movements every few hours that do not improve with fasting.  She is having some associated nausea that is well-controlled with Zofran.  She does have a history of IBS but does not think that the symptoms are consistent with her prior IBS symptoms.  She currently has a follow-up with gastroenterology on 07/01/2023.  Due to this diarrhea continuing for 4 weeks we will further evaluate for chronic infectious causes and common metabolic/autoimmune disorders. - GI pathogen panel, TSH, celiac serologies, and CMP - Continue with Imodium as needed and follow-up with GI as scheduled in January    Patient discussed with Dr. Duwayne Heck,  DO [facility] Internal Medicine Resident PGY-2 Clinic Phone: [phone] Pager: [phone]

## 2023-03-27 ENCOUNTER — Encounter: Payer: Self-pay | Admitting: Student

## 2023-03-27 DIAGNOSIS — B333 Retrovirus infections, not elsewhere classified: Secondary | ICD-10-CM

## 2023-03-27 DIAGNOSIS — E119 Type 2 diabetes mellitus without complications: Secondary | ICD-10-CM

## 2023-03-27 LAB — HEMOGLOBIN A1C
Est. average glucose Bld gHb Est-mCnc: 151 mg/dL
Hgb A1c MFr Bld: 6.9 % — ABNORMAL HIGH (ref 4.8–5.6)

## 2023-03-28 DIAGNOSIS — R609 Edema, unspecified: Secondary | ICD-10-CM | POA: Insufficient documentation

## 2023-03-28 DIAGNOSIS — E119 Type 2 diabetes mellitus without complications: Secondary | ICD-10-CM | POA: Insufficient documentation

## 2023-03-28 MED ORDER — ONETOUCH VERIO W/DEVICE KIT: 1.0000 | PACK | 0 refills | Status: DC | PRN

## 2023-03-28 MED ORDER — LANCETS THIN MISC
99 refills | Status: DC
Start: 2023-03-28 — End: 2023-04-01

## 2023-03-28 MED ORDER — GLUCOSE BLOOD VI STRP
ORAL_STRIP | 99 refills | Status: DC
Start: 2023-03-28 — End: 2023-04-01

## 2023-03-28 NOTE — Assessment & Plan Note (Signed)
A1c during recent hospitalization of 6.5 and repeat today up to 6.9.  She has a glucometer at home.  She will bring this in to her next visit.  I do not think that she would be a good candidate for metformin at this time due to her issues with chronic diarrhea. She has previously done well with a low-sodium and diabetic meals from [employer].  It had been set up in the past by her Hotel manager.  She saw a decrease in her A1c to 6.1 while she had these meals.  I believe that she would benefit from these meals again and we will work with her and her insurance to obtain them. - We will continue with lifestyle modification with the above diet and provided meals - Repeat A1c in 3 months and consider GLP-1 at that time

## 2023-03-28 NOTE — Assessment & Plan Note (Signed)
Patient last seen for a telehealth visit on 02/27/2023 with acute diarrhea.  She was trialed on as needed Imodium but this has not improved her symptoms.  She is having soft brown or watery bowel movements every few hours that do not improve with fasting.  She is having some associated nausea that is well-controlled with Zofran.  She does have a history of IBS but does not think that the symptoms are consistent with her prior IBS symptoms.  She currently has a follow-up with gastroenterology on 07/01/2023.  Due to this diarrhea continuing for 4 weeks we will further evaluate for chronic infectious causes and common metabolic/autoimmune disorders. - GI pathogen panel, TSH, celiac serologies, and CMP - Continue with Imodium as needed and follow-up with GI as scheduled in January

## 2023-03-28 NOTE — Assessment & Plan Note (Signed)
Patient was recently seen by ENT for right-sided parotid swelling.  At that visit her ENT ordered a CT to further evaluate but her co-pay is too high.  We will reorder the CT to further evaluate her right-sided facial swelling that is possibly chronic parotitis. - CT of the soft tissues of the neck with contrast

## 2023-03-28 NOTE — Assessment & Plan Note (Signed)
After visit she requested a new order for CPAP.  I have put in a referral for DME for CPAP machine from her previous home health company [facility].

## 2023-03-29 ENCOUNTER — Telehealth: Payer: Self-pay

## 2023-03-29 LAB — TSH: TSH: 3.46 u[IU]/mL (ref 0.450–4.500)

## 2023-03-29 LAB — CMP14 + ANION GAP
ALT: 19 [IU]/L (ref 0–32)
AST: 18 [IU]/L (ref 0–40)
Albumin: 4.4 g/dL (ref 3.8–4.9)
Alkaline Phosphatase: 78 [IU]/L (ref 44–121)
Anion Gap: 16 mmol/L (ref 10.0–18.0)
BUN/Creatinine Ratio: 9 (ref 9–23)
BUN: 8 mg/dL (ref 6–24)
Bilirubin Total: 0.7 mg/dL (ref 0.0–1.2)
CO2: 25 mmol/L (ref 20–29)
Calcium: 9.5 mg/dL (ref 8.7–10.2)
Chloride: 101 mmol/L (ref 96–106)
Creatinine, Ser: 0.9 mg/dL (ref 0.57–1.00)
Globulin, Total: 2.6 g/dL (ref 1.5–4.5)
Glucose: 105 mg/dL — ABNORMAL HIGH (ref 70–99)
Potassium: 4.1 mmol/L (ref 3.5–5.2)
Sodium: 142 mmol/L (ref 134–144)
Total Protein: 7 g/dL (ref 6.0–8.5)
eGFR: 75 mL/min/{1.73_m2} (ref >59)

## 2023-03-29 LAB — TISSUE TRANSGLUTAMINASE ABS,IGG,IGA
Tissue Transglut Ab: 3 U/mL (ref 0–5)
Transglutaminase IgA: <2 U/mL (ref 0–3)

## 2023-03-29 NOTE — Telephone Encounter (Signed)
Incoming fax from pharmacy Medication: Lancets this misc Message to prescriber: "use to check home glucose daily not accepted-need frequency. How many times daily?"

## 2023-03-29 NOTE — Telephone Encounter (Signed)
Incoming fax from pharmacy Medication: glucose blood test strip Message to prescriber: "Use a directed not accepted-rx needs a frequency, How many times daily?"

## 2023-03-31 ENCOUNTER — Encounter: Payer: Self-pay | Admitting: Student

## 2023-04-01 ENCOUNTER — Other Ambulatory Visit: Payer: Self-pay | Admitting: Student

## 2023-04-01 DIAGNOSIS — E119 Type 2 diabetes mellitus without complications: Secondary | ICD-10-CM

## 2023-04-01 MED ORDER — GLUCOSE BLOOD VI STRP
ORAL_STRIP | 99 refills | Status: DC
Start: 2023-04-01 — End: 2024-05-04

## 2023-04-01 MED ORDER — LANCETS THIN MISC
99 refills | Status: DC
Start: 2023-04-01 — End: 2024-05-04

## 2023-04-01 NOTE — Telephone Encounter (Signed)
Modified prescription to specify frequency as requested

## 2023-04-01 NOTE — Telephone Encounter (Signed)
Please advise rx refill 

## 2023-04-01 NOTE — Progress Notes (Signed)
Internal Medicine Clinic Attending  Case discussed with the resident at the time of the visit.  We reviewed the resident's history and exam and pertinent patient test results.  I agree with the assessment, diagnosis, and plan of care documented in the resident's note.  

## 2023-04-01 NOTE — Progress Notes (Unsigned)
Per pharmacy - requested updated signature for test strips and lancets. Completed

## 2023-04-03 NOTE — Telephone Encounter (Signed)
Rec'd a call from the pt along with an [employer] Representative in regards to the following information.  1.The following Refill Request .valACYclovir (VALTREX) 1000 MG tablet  hydrOXYzine (ATARAX) 10 MG tablet   Pharmacy [facility] - [place], [place] - [address]    2.  Pt states no one has called her back in regards to her test Results from 03/26/2023.  3. Pt states in her OV on 03/26/2023 she discussed needing a CBC for her WBC to be able to continue with her [employer] [facility], in which they do requires an updated WBC results and her last CBC was 01/30/2023.  4. The pt also wants to discuss the "[employer]" program which is a [employer] that is not covered by her [employer] Ins and the Requirements to be able to receive her healthy foods.    Please call back to speak with the patient.

## 2023-04-04 ENCOUNTER — Other Ambulatory Visit: Payer: Self-pay | Admitting: Student

## 2023-04-04 DIAGNOSIS — L509 Urticaria, unspecified: Secondary | ICD-10-CM

## 2023-04-04 MED ORDER — HYDROXYZINE HCL 10 MG PO TABS
10.0000 mg | ORAL_TABLET | Freq: Two times a day (BID) | ORAL | 5 refills | Status: DC
Start: 2023-04-04 — End: 2023-04-04

## 2023-04-04 MED ORDER — HYDROXYZINE HCL 10 MG PO TABS: 10.0000 mg | ORAL_TABLET | Freq: Three times a day (TID) | ORAL | 5 refills | Status: DC | PRN

## 2023-04-04 NOTE — Telephone Encounter (Signed)
Called and discussed a lot of her concerns outlined in my chart messages. Discussed diet through [employer] and she has called them who have requested documentation of her need for this diet.  I put in some information in her recent office note which she will try to print off for the program.  We discussed her concerns of being told she has heart failure and peripheral artery disease as well as an unsuccessful prior workup for her "heart stopping" with a Holter monitor that malfunctioned.  We discussed prior imaging showing empty sella on her scans which is seen in intracranial hypertension.  We discussed that she does not need an endocrine referral to manage her hypothyroidism or diabetes at this point.  She also discussed her HTLV-1 diagnosis and the clinical study that she is in with the [facility] but she needs an updated CBC for.  She would also like to find a closer ID physician since she has to drive to [facility] for her appointments right now.  We also discussed at length her frustrations about not having enough time to discuss her concerns at clinic appointments and having appointments that are 2 months or more apart.  Plan today is to send a message to our front desk to look for an hour-long appointment at the next available time for her.  At that appointment I think will be important to prioritize evaluation of heart failure symptoms and if an updated echocardiogram is needed, further discuss the feeling of her heart stopping and consider cardiology referral with Holter monitor, evaluate for peripheral artery disease symptoms and consider ABIs, screen for intracranial hypertension symptoms, and ensure she has follow-up with [facility], podiatry, and ophthalmology.  Rocky Morel, DO Internal Medicine Resident, PGY-2 Pager# [phone] Please contact the on call pager after 5 pm and on weekends at [phone].

## 2023-04-04 NOTE — Addendum Note (Signed)
Addended by: Rocky Morel on: 04/04/2023 10:32 AM   Modules accepted: Orders

## 2023-04-04 NOTE — Progress Notes (Signed)
Called and discussed results of normal TSH, TTG antibodies, and CMP with the patient.  Hemoglobin A1c was addressed during her visit.

## 2023-04-04 NOTE — Addendum Note (Signed)
Addended by: Rocky Morel on: 04/04/2023 09:26 AM   Modules accepted: Orders

## 2023-04-11 ENCOUNTER — Other Ambulatory Visit (HOSPITAL_COMMUNITY): Payer: Self-pay

## 2023-04-22 ENCOUNTER — Encounter: Payer: Self-pay | Admitting: Infectious Disease

## 2023-04-22 ENCOUNTER — Ambulatory Visit: Payer: Medicare HMO | Admitting: Infectious Disease

## 2023-04-22 DIAGNOSIS — R768 Other specified abnormal immunological findings in serum: Secondary | ICD-10-CM | POA: Insufficient documentation

## 2023-04-22 HISTORY — DX: Other specified abnormal immunological findings in serum: R76.8

## 2023-05-02 DIAGNOSIS — F341 Dysthymic disorder: Secondary | ICD-10-CM | POA: Diagnosis not present

## 2023-05-02 DIAGNOSIS — F411 Generalized anxiety disorder: Secondary | ICD-10-CM | POA: Diagnosis not present

## 2023-05-15 ENCOUNTER — Encounter: Payer: Medicare HMO | Admitting: Student

## 2023-05-20 ENCOUNTER — Ambulatory Visit (HOSPITAL_COMMUNITY): Payer: Medicare HMO

## 2023-06-13 ENCOUNTER — Ambulatory Visit: Payer: Medicare HMO | Admitting: Student

## 2023-06-13 VITALS — BP 118/62 | HR 84 | Temp 98.3°F | Ht 69.0 in | Wt 375.1 lb

## 2023-06-13 DIAGNOSIS — G4733 Obstructive sleep apnea (adult) (pediatric): Secondary | ICD-10-CM | POA: Diagnosis not present

## 2023-06-13 DIAGNOSIS — E119 Type 2 diabetes mellitus without complications: Secondary | ICD-10-CM | POA: Diagnosis not present

## 2023-06-13 DIAGNOSIS — W19XXXA Unspecified fall, initial encounter: Secondary | ICD-10-CM | POA: Diagnosis not present

## 2023-06-13 DIAGNOSIS — Z23 Encounter for immunization: Secondary | ICD-10-CM | POA: Diagnosis not present

## 2023-06-13 DIAGNOSIS — K529 Noninfective gastroenteritis and colitis, unspecified: Secondary | ICD-10-CM

## 2023-06-13 DIAGNOSIS — R197 Diarrhea, unspecified: Secondary | ICD-10-CM

## 2023-06-13 DIAGNOSIS — B333 Retrovirus infections, not elsewhere classified: Secondary | ICD-10-CM

## 2023-06-13 DIAGNOSIS — J441 Chronic obstructive pulmonary disease with (acute) exacerbation: Secondary | ICD-10-CM

## 2023-06-13 DIAGNOSIS — Z0000 Encounter for general adult medical examination without abnormal findings: Secondary | ICD-10-CM | POA: Insufficient documentation

## 2023-06-13 HISTORY — DX: Unspecified fall, initial encounter: W19.XXXA

## 2023-06-13 NOTE — Patient Instructions (Addendum)
Please return to provide a urine sample.  Please take Tylenol for your rib pain and let us know if this does not improve.

## 2023-06-13 NOTE — Progress Notes (Signed)
CC: Follow-up  HPI:  Ms.Arali A Collen is a 57 y.o. female living with a history stated below and presents today for follow-up. Please see problem based assessment and plan for additional details.  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     albuterol (VENTOLIN HFA) 108 (90 Base) MCG/ACT inhaler Inhale 1-2 puffs into the lungs every 6 (six) hours as needed for wheezing or shortness of breath. 1 each 0   amitriptyline (ELAVIL) 100 MG tablet Take 100 mg by mouth at bedtime.     amLODipine (NORVASC) 10 MG tablet Take 1 tablet (10 mg total) by mouth daily. 90 tablet 3   ASPERCREME LIDOCAINE EX Apply 1 application topically 2 (two) times daily as needed (for muscle pain). (Patient not taking: Reported on 01/24/2023)     azelastine (ASTELIN) 0.1 % nasal spray USE 2 SPRAYS EACH NOSTRIL TWICE DAILY. (Patient taking differently: Place 2 sprays into both nostrils 2 (two) times daily.) 30 mL 0   Blood Glucose Monitoring Suppl (ONETOUCH VERIO) w/Device KIT 1 each by Does not apply route as needed. 1 kit 0   clobetasol ointment (TEMOVATE) 0.05 % Apply 1 application topically as needed (for [place]).     diazepam (VALIUM) 5 MG tablet Take 1 tablet (5 mg total) by mouth every 8 (eight) hours as needed for anxiety. (Patient taking differently: Take 5 mg by mouth 2 (two)  times daily.) 90 tablet 0   fluticasone (FLONASE) 50 MCG/ACT nasal spray Place 1 spray into both nostrils daily. 16 g 11   Fluticasone-Umeclidin-Vilant (TRELEGY ELLIPTA) 200-62.5-25 MCG/ACT AEPB Inhale 1 Inhalation into the lungs daily at 6 (six) AM. 60 each 1   furosemide (LASIX) 40 MG tablet Take 1 tablet (40 mg total) by mouth 2 (two) times daily. 60 tablet 11   glucose blood test strip Use as instructed to check blood sugar three times daily 100 each prn   hydrOXYzine (ATARAX) 10 MG tablet Take 1-2.5 tablets (10-25 mg total) by mouth every 8 (eight) hours as needed. 60 tablet 5   Incontinence Supply Disposable (PREVAIL WASHCLOTHS) MISC Use daily as directed 96 each PRN   Lancets Thin MISC Use to check home glucose three times daily 100 each prn   levothyroxine (SYNTHROID) 50 MCG tablet Take 1 tablet (50 mcg total) by mouth daily before breakfast. 90 tablet 3   loperamide (IMODIUM) 2 MG capsule Take 1 capsule (2 mg total) by mouth as needed for diarrhea or loose stools. 30 capsule 0   metoprolol tartrate (LOPRESSOR) 100 MG tablet Take 1 tablet (100 mg total) by mouth 2 (two) times daily. 180 tablet 3   Nerve Stimulator (PRO COMFORT TENS UNIT) DEVI 1 Device by Does not apply route daily as  needed. 1 Device 1   nystatin cream (MYCOSTATIN) Apply 1 Application topically 2 (two) times daily. 30 g 11   ondansetron (ZOFRAN-ODT) 8 MG disintegrating tablet Take 1 tablet (8 mg total) by mouth every 8 (eight) hours as needed for nausea or vomiting. 20 tablet 0   potassium chloride SA (KLOR-CON M) 20 MEQ tablet Take 1 tablet (20 mEq total) by mouth 2 (two) times daily. 20 tablet 0   Skin Protectants, Misc. (EUCERIN) cream Apply topically as needed for dry skin. 454 g 0   triamcinolone ointment (KENALOG) 0.5 % Apply 1 application topically daily.     valACYclovir (VALTREX) 1000 MG tablet Take 1 tablet (1,000 mg total) by mouth daily. 30 tablet 3   vitamin B-12 (CYANOCOBALAMIN) 100 MCG tablet Take 1 tablet  (100 mcg total) by mouth once a week. 52 tablet 1   Vitamin D, Ergocalciferol, (DRISDOL) 1.25 MG (50000 UNIT) CAPS capsule Take 50,000 Units by mouth once a week. On Saturday     No current facility-administered medications on file prior to visit.    Family History  Problem Relation Age of Onset   Diabetes Mother    Heart disease Mother    Hyperlipidemia Mother    Hypertension Mother    Stroke Mother    Mental illness Mother    Heart failure Mother    Headache Mother        Joellyn Quails Cyst   Breast cancer Mother 1       diagnosed 3 times   Heart disease Father    Alcohol abuse Father    Hypertension Sister    Heart disease Brother    Mental illness Brother    COPD Brother    Heart failure Brother    Hypertension Sister    Colon cancer Neg Hx     Social History   Socioeconomic History   Marital status: Widowed    Spouse name: Newman Pies   Number of children: 0   Years of education: college+   Highest education level: Bachelor's degree (e.g., BA, AB, BS)  Occupational History   Occupation: disability    Comment: fibromyalgia  Tobacco Use   Smoking status: Every Day    Current packs/day: 0.30    Average packs/day: 0.3 packs/day for 33.0 years (9.9 ttl pk-yrs)    Types: Cigarettes   Smokeless tobacco: Never  Vaping Use   Vaping status: Never Used  Substance and Sexual Activity   Alcohol use: No   Drug use: No   Sexual activity: Never  Other Topics Concern   Not on file  Social History Narrative   Moved to [place], [place] from [place] 04/28/2016 in need of a change.   Her family remains in [place].   Lives alone.   Social Drivers of Corporate investment banker Strain: High Risk (01/28/2023)   Overall Financial Resource Strain (CARDIA)    Difficulty of Paying Living Expenses: Very hard  Food Insecurity: Food Insecurity Present (01/28/2023)   Hunger Vital Sign    Worried About Running Out of Food in the Last Year: Often true    Ran Out  of Food in the Last Year: Often true  Transportation Needs: No Transportation Needs (01/28/2023)   PRAPARE - Administrator, Civil Service (Medical): No    Lack of Transportation (Non-Medical): No  Recent Concern: Transportation Needs - Unmet Transportation Needs (11/07/2022)   Received from [facility]   [facility] - Transportation  Lack of Transportation (Medical): No    Lack of Transportation (Non-Medical): Yes  Physical Activity: Unknown (01/28/2023)   Exercise Vital Sign    Days of Exercise per Week: 0 days    Minutes of Exercise per Session: Not on file  Stress: Stress Concern Present (01/28/2023)   [employer] of Occupational Health - Occupational Stress Questionnaire    Feeling of Stress : Rather much  Social Connections: Socially Isolated (01/28/2023)   Social Connection and Isolation Panel [NHANES]    Frequency of Communication with Friends and Family: Three times a week    Frequency of Social Gatherings with Friends and Family: Never    Attends Religious Services: Never    Database administrator or Organizations: No    Attends Engineer, structural: Not on file    Marital Status: Widowed  Intimate Partner Violence: Not At Risk (11/07/2022)   Received from [facility], [facility]   HITS    Over the last 12 months how often did your partner physically hurt you?: Never    Over the last 12 months how often did your partner insult you or talk down to you?: Never    Over the last 12 months how often did your partner threaten you with physical harm?: Never    Over the last 12 months how often did your partner scream or curse at you?: Never    Review of Systems: ROS negative except for what is noted on the assessment and plan.  Vitals:   06/13/23 0959  BP: 118/62  Pulse: 84  Temp: 98.3 F (36.8 C)  TempSrc: Oral  SpO2: 97%  Weight: (!) 375 lb 1.6 oz (170.1 kg)  Height: 5\' 9"  (1.753 m)    Physical Exam: Constitutional: sitting in  chair, in no acute distress Cardiovascular: regular rate and rhythm, no m/r/g Pulmonary/Chest: normal work of breathing on room air, lungs clear to auscultation bilaterally MSK: Right rib 5/6 with moderate tenderness to palpation in mid-axillary line. No radiation. Skin: warm and dry Psych: normal mood and behavior  Assessment & Plan:     Patient seen with Dr. Sol Blazing  Type 2 diabetes mellitus without complication, without long-term current use of insulin (HCC) Diet controlled. A1c 03/2023 was 6.9. Scheduled one month follow-up to re-check. UACR ordered and patient will come back to give sample. Foot exam performed and is unremarkable.   OSA (obstructive sleep apnea) Referral sent for CPAP and patient was told she needs an updated sleep study. Sent today.   Fall Mechanical fall while getting dressed/rushing a few days ago.. Denies hitting head/LOC. Endorsing moderate right-sided rib pain that is unchanged since fall. Patient unsure if this was from fall or from hx of costochondritis/fibromyalgia. Has not taken medication for pain. Advised patient to try at-home pain management and if this does not improve symptoms within a week or two, we can discuss imaging.  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 Needs WBC count per NIH.  -CBC ordered  Healthcare maintenance -Mammography referral -Prevnar given in office -Eye appointment coming up  Diarrhea Improved since last visit. Patient brought in stool sample for PCR testing.  Carmina Miller, D.O. [facility] Internal Medicine, PGY-1 Phone: [phone] Date 06/13/2023 Time 1:51 PM

## 2023-06-13 NOTE — Progress Notes (Deleted)
CC: Follow-up  HPI:  [name] is a 57 y.o. female living with a history stated below and presents today for a follow-up. Please see problem based assessment and plan for additional details.  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     albuterol (VENTOLIN HFA) 108 (90 Base) MCG/ACT inhaler Inhale 1-2 puffs into the lungs every 6 (six) hours as needed for wheezing or shortness of breath. 1 each 0   amitriptyline (ELAVIL) 100 MG tablet Take 100 mg by mouth at bedtime.     amLODipine (NORVASC) 10 MG tablet Take 1 tablet (10 mg total) by mouth daily. 90 tablet 3   ASPERCREME LIDOCAINE EX Apply 1 application topically 2 (two) times daily as needed (for muscle pain). (Patient not taking: Reported on 01/24/2023)     azelastine (ASTELIN) 0.1 % nasal spray USE 2 SPRAYS EACH NOSTRIL TWICE DAILY. (Patient taking differently: Place 2 sprays into both nostrils 2 (two) times daily.) 30 mL 0   Blood Glucose Monitoring Suppl (ONETOUCH VERIO) w/Device KIT 1 each by Does not apply route as needed. 1 kit 0   clobetasol ointment (TEMOVATE) 0.05 % Apply 1 application topically as needed (for [place]).     diazepam (VALIUM) 5 MG tablet Take 1 tablet (5 mg total) by mouth every 8 (eight) hours as needed for anxiety. (Patient taking differently: Take 5 mg by mouth 2  (two) times daily.) 90 tablet 0   fluticasone (FLONASE) 50 MCG/ACT nasal spray Place 1 spray into both nostrils daily. 16 g 11   Fluticasone-Umeclidin-Vilant (TRELEGY ELLIPTA) 200-62.5-25 MCG/ACT AEPB Inhale 1 Inhalation into the lungs daily at 6 (six) AM. 60 each 1   furosemide (LASIX) 40 MG tablet Take 1 tablet (40 mg total) by mouth 2 (two) times daily. 60 tablet 11   glucose blood test strip Use as instructed to check blood sugar three times daily 100 each prn   hydrOXYzine (ATARAX) 10 MG tablet Take 1-2.5 tablets (10-25 mg total) by mouth every 8 (eight) hours as needed. 60 tablet 5   Incontinence Supply Disposable (PREVAIL WASHCLOTHS) MISC Use daily as directed 96 each PRN   Lancets Thin MISC Use to check home glucose three times daily 100 each prn   levothyroxine (SYNTHROID) 50 MCG tablet Take 1 tablet (50 mcg total) by mouth daily before breakfast. 90 tablet 3   loperamide (IMODIUM) 2 MG capsule Take 1 capsule (2 mg total) by mouth as needed for diarrhea or loose stools. 30 capsule 0   metoprolol tartrate (LOPRESSOR) 100 MG tablet Take 1 tablet (100 mg total) by mouth 2 (two) times daily. 180 tablet 3   Nerve Stimulator (PRO COMFORT TENS UNIT) DEVI 1 Device by Does not apply route daily  as needed. 1 Device 1   nystatin cream (MYCOSTATIN) Apply 1 Application topically 2 (two) times daily. 30 g 11   ondansetron (ZOFRAN-ODT) 8 MG disintegrating tablet Take 1 tablet (8 mg total) by mouth every 8 (eight) hours as needed for nausea or vomiting. 20 tablet 0   potassium chloride SA (KLOR-CON M) 20 MEQ tablet Take 1 tablet (20 mEq total) by mouth 2 (two) times daily. 20 tablet 0   Skin Protectants, Misc. (EUCERIN) cream Apply topically as needed for dry skin. 454 g 0   triamcinolone ointment (KENALOG) 0.5 % Apply 1 application topically daily.     valACYclovir (VALTREX) 1000 MG tablet Take 1 tablet (1,000 mg total) by mouth daily. 30 tablet 3   vitamin B-12 (CYANOCOBALAMIN) 100 MCG tablet Take 1  tablet (100 mcg total) by mouth once a week. 52 tablet 1   Vitamin D, Ergocalciferol, (DRISDOL) 1.25 MG (50000 UNIT) CAPS capsule Take 50,000 Units by mouth once a week. On Saturday     No current facility-administered medications on file prior to visit.    Family History  Problem Relation Age of Onset   Diabetes Mother    Heart disease Mother    Hyperlipidemia Mother    Hypertension Mother    Stroke Mother    Mental illness Mother    Heart failure Mother    Headache Mother        Brianna Roberts Cyst   Breast cancer Mother 50       diagnosed 3 times   Heart disease Father    Alcohol abuse Father    Hypertension Sister    Heart disease Brother    Mental illness Brother    COPD Brother    Heart failure Brother    Hypertension Sister    Colon cancer Neg Hx     Social History   Socioeconomic History   Marital status: Widowed    Spouse name: Brianna Roberts   Number of children: 0   Years of education: college+   Highest education level: Bachelor's degree (e.g., BA, AB, BS)  Occupational History   Occupation: disability    Comment: fibromyalgia  Tobacco Use   Smoking status: Every Day    Current packs/day: 0.30    Average packs/day: 0.3 packs/day for 33.0 years (9.9 ttl pk-yrs)    Types: Cigarettes   Smokeless tobacco: Never  Vaping Use   Vaping status: Never Used  Substance and Sexual Activity   Alcohol use: No   Drug use: No   Sexual activity: Never  Other Topics Concern   Not on file  Social History Narrative   Moved to [place], [place] from [place] 04/28/2016 in need of a change.   Her family remains in [place].   Lives alone.   Social Drivers of Corporate investment banker Strain: High Risk (01/28/2023)   Overall Financial Resource Strain (CARDIA)    Difficulty of Paying Living Expenses: Very hard  Food Insecurity: Food Insecurity Present (01/28/2023)   Hunger Vital Sign    Worried About Running Out of Food in the Last Year: Often true     Ran Out of Food in the Last Year: Often true  Transportation Needs: No Transportation Needs (01/28/2023)   PRAPARE - Administrator, Civil Service (Medical): No    Lack of Transportation (Non-Medical): No  Recent Concern: Transportation Needs - Unmet Transportation Needs (11/07/2022)   Received [facility]   [facility] - Transportation  Lack of Transportation (Medical): No    Lack of Transportation (Non-Medical): Yes  Physical Activity: Unknown (01/28/2023)   Exercise Vital Sign    Days of Exercise per Week: 0 days    Minutes of Exercise per Session: Not on file  Stress: Stress Concern Present (01/28/2023)   [employer] of Occupational Health - Occupational Stress Questionnaire    Feeling of Stress : Rather much  Social Connections: Socially Isolated (01/28/2023)   Social Connection and Isolation Panel [NHANES]    Frequency of Communication with Friends and Family: Three times a week    Frequency of Social Gatherings with Friends and Family: Never    Attends Religious Services: Never    Database administrator or Organizations: No    Attends Engineer, structural: Not on file    Marital Status: Widowed  Intimate Partner Violence: Not At Risk (11/07/2022)   [facility]   HITS    Over the last 12 months how often did your partner physically hurt you?: Never    Over the last 12 months how often did your partner insult you or talk down to you?: Never    Over the last 12 months how often did your partner threaten you with physical harm?: Never    Over the last 12 months how often did your partner scream or curse at you?: Never    Review of Systems: ROS negative except for what is noted on the assessment and plan.  Vitals:   06/13/23 0959  BP: 118/62  Pulse: 84  Temp: 98.3 F (36.8 C)  TempSrc: Oral  SpO2: 97%  Weight: (!) 375 lb 1.6 oz (170.1 kg)  Height: 5\' 9"  (1.753 m)    Physical Exam: Constitutional:  sitting in chair, in no acute distress Cardiovascular: regular rate and rhythm, no m/r/g Pulmonary/Chest: normal work of breathing on room air, lungs clear to auscultation bilaterally MSK: Right rib 5/6 with moderate tenderness to palpation in mid-axillary line. No radiation. Skin: warm and dry Psych: normal mood and behavior  Assessment & Plan:     Patient seen with Dr. Sol Blazing  Type 2 diabetes mellitus without complication, without long-term current use of insulin (HCC) Diet controlled. A1c 03/2023 was 6.9. Scheduled one month follow-up to re-check. UACR ordered and patient will come back to give sample. Foot exam performed and is unremarkable.   OSA (obstructive sleep apnea) Referral sent for CPAP and patient was told she needs an updated sleep study. Sent today.   Fall Mechanical fall while getting dressed/rushing a few days ago.. Denies hitting head/LOC. Endorsing moderate right-sided rib pain that is unchanged since fall. Patient unsure if this was from fall or from hx of costochondritis/fibromyalgia. Has not taken medication for pain. Advised patient to try at-home pain management and if this does not improve symptoms within a week or two, we can discuss imaging.  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 Needs WBC count per NIH.  -CBC ordered  Healthcare maintenance -Mammography referral -Prevnar given in office -Eye appointment coming up  Diarrhea Improved since last visit. Patient brought in stool sample for PCR testing.   Carmina Miller, D.O. [facility] Internal Medicine, PGY-1 Phone: [phone] Date 06/13/2023 Time 1:13 PM

## 2023-06-13 NOTE — Assessment & Plan Note (Signed)
Improved since last visit. Patient brought in stool sample for PCR testing.

## 2023-06-13 NOTE — Assessment & Plan Note (Addendum)
Diet controlled. A1c 03/2023 was 6.9. Scheduled one month follow-up to re-check. UACR ordered and patient will come back to give sample. Foot exam performed and is unremarkable.

## 2023-06-13 NOTE — Assessment & Plan Note (Addendum)
Referral sent for CPAP and patient was told she needs an updated sleep study. Sent today.

## 2023-06-13 NOTE — Assessment & Plan Note (Signed)
Participant in NIH study. Needs WBC count per NIH.  -CBC ordered

## 2023-06-13 NOTE — Assessment & Plan Note (Addendum)
Mechanical fall while getting dressed/rushing a few days ago.. Denies hitting head/LOC. Endorsing moderate right-sided rib pain that is unchanged since fall. Patient unsure if this was from fall or from hx of costochondritis/fibromyalgia. Has not taken medication for pain. Advised patient to try at-home pain management and if this does not improve symptoms within a week or two, we can discuss imaging.

## 2023-06-13 NOTE — Assessment & Plan Note (Addendum)
-  Mammography referral -Prevnar given in office -Eye appointment coming up

## 2023-06-14 ENCOUNTER — Encounter: Payer: Self-pay | Admitting: Student

## 2023-06-14 LAB — CBC
Hematocrit: 42.8 % (ref 34.0–46.6)
Hemoglobin: 13.9 g/dL (ref 11.1–15.9)
MCH: 27.4 pg (ref 26.6–33.0)
MCHC: 32.5 g/dL (ref 31.5–35.7)
MCV: 84 fL (ref 79–97)
Platelets: 367 10*3/uL (ref 150–450)
RBC: 5.07 x10E6/uL (ref 3.77–5.28)
RDW: 13.2 % (ref 11.7–15.4)
WBC: 9.5 10*3/uL (ref 3.4–10.8)

## 2023-06-14 NOTE — Progress Notes (Signed)
 Internal Medicine Clinic Attending  I saw and evaluated the patient.  I personally confirmed the key portions of the history and exam documented by Dr. Annie Paras and I reviewed pertinent patient test results.  The assessment, diagnosis, and plan were formulated together and I agree with the documentation in the resident's note.

## 2023-06-17 LAB — GI PROFILE, STOOL, PCR

## 2023-06-28 ENCOUNTER — Telehealth: Payer: Self-pay

## 2023-06-28 NOTE — Telephone Encounter (Signed)
 Rx refill request from the pharmacy for levothyroxine, per pharmacy they need permission to switch to a different manufacture.

## 2023-07-01 ENCOUNTER — Ambulatory Visit: Payer: Medicare HMO | Admitting: Nurse Practitioner

## 2023-07-08 ENCOUNTER — Telehealth: Payer: Self-pay

## 2023-07-08 NOTE — Telephone Encounter (Signed)
 RTC to patient states needs a letter Urgently for [employer] for [employer],  Patient would like for letter to be placed on MyChart so that she can get it to [employer] as soo as possible.  Power is due to be turned off by Wednesday.  Patient is on C-Pap na needs to charge her phone for emergency calls if needed. This needs to be mentioned in the letter to [employer] for [employer].  Has had a letter done before. Message to be sent to Dr. Marylu and the Yellow Team.

## 2023-07-08 NOTE — Telephone Encounter (Signed)
 Requesting a letter for [employer] stating she has cpap machine and also need the electricity to charge her phone. Requesting the electricity not to be turn off. Please call pt back.

## 2023-07-08 NOTE — Telephone Encounter (Signed)
 I have placed a letter in the communications tab stating the patient has OSA which merits use of CPAP.

## 2023-07-10 ENCOUNTER — Encounter (INDEPENDENT_AMBULATORY_CARE_PROVIDER_SITE_OTHER): Payer: Self-pay

## 2023-07-11 ENCOUNTER — Encounter: Payer: Medicare HMO | Admitting: Student

## 2023-07-11 ENCOUNTER — Telehealth: Payer: Medicare HMO

## 2023-07-25 ENCOUNTER — Telehealth: Payer: Self-pay | Admitting: *Deleted

## 2023-07-25 NOTE — Telephone Encounter (Signed)
Call from [place] RN/SW with [facility] states got a call from patient asking how she will get refills on her Valium and Amitriptyline.  Meds were prescribed by her previous Psychiatrist.  Ardeen Jourdain.  Dr. Rayburn Ma suddenly left the practice and the other physicians there will not reorder the medication.  Patient also sees a Veterinary surgeon for MH who is unable to prescribe the medication.  Last fill on the Amitriptyline was last month and patient should have enough to last until her appointment here in the Clinics.  The Valium was last prescribed 05/01/2024 given 60 [ills to take prn.  If possible ids requesting a refill to go to the [facility] on [address].

## 2023-07-25 NOTE — Telephone Encounter (Signed)
RTC from [facility], Patient was in fact seeing a NP who left the Practice and the doctor she was working with is out of the office until February.  Patient to get an appointment with the Psychiatrist when they return.  Still in needs a prescription for the Valium for 1 month if possible until she is seen by the Psychiatrist.

## 2023-07-29 ENCOUNTER — Other Ambulatory Visit: Payer: Self-pay | Admitting: Student

## 2023-07-29 ENCOUNTER — Ambulatory Visit: Payer: Medicare HMO | Admitting: Gastroenterology

## 2023-07-29 DIAGNOSIS — F411 Generalized anxiety disorder: Secondary | ICD-10-CM | POA: Diagnosis not present

## 2023-07-29 DIAGNOSIS — F32A Depression, unspecified: Secondary | ICD-10-CM

## 2023-07-29 DIAGNOSIS — F341 Dysthymic disorder: Secondary | ICD-10-CM | POA: Diagnosis not present

## 2023-07-29 MED ORDER — DIAZEPAM 5 MG PO TABS: 5.0000 mg | ORAL_TABLET | Freq: Three times a day (TID) | ORAL | 0 refills | Status: DC | PRN

## 2023-07-29 NOTE — Telephone Encounter (Signed)
Call to [facility] to inform her that Dr. Annie Paras sent in a prescription in for the Valium for the patient.  Patient to be encouraged to keep her appointment scheduled with her Psychiatrist.

## 2023-07-31 ENCOUNTER — Encounter: Payer: Medicare HMO | Admitting: Internal Medicine

## 2023-07-31 DIAGNOSIS — Z79899 Other long term (current) drug therapy: Secondary | ICD-10-CM | POA: Diagnosis not present

## 2023-08-01 ENCOUNTER — Telehealth: Payer: Self-pay | Admitting: *Deleted

## 2023-08-01 ENCOUNTER — Institutional Professional Consult (permissible substitution): Payer: Medicare HMO | Admitting: Neurology

## 2023-08-01 DIAGNOSIS — M797 Fibromyalgia: Secondary | ICD-10-CM

## 2023-08-01 DIAGNOSIS — F419 Anxiety disorder, unspecified: Secondary | ICD-10-CM

## 2023-08-01 NOTE — Telephone Encounter (Signed)
 Received a call from Roxy with [facility] (mental health facility/medication management) who stated pt needs her PCP to prescribe Amitriptyline  and Diazepam  since pt's provider at their office is no longer there and their other provider is on vacation,unavailable. Stated she will fax pt's ROI form, office notes and medications they prescribed. Our fax# given. Pt already had a Diazepam  rx sent to the pharmacy on 07/29/23 by Dr Marylu.

## 2023-08-01 NOTE — Telephone Encounter (Signed)
 Info from [facility] has been placed in the Yellow Team box.

## 2023-08-02 MED ORDER — AMITRIPTYLINE HCL 100 MG PO TABS: 100.0000 mg | ORAL_TABLET | Freq: Every day | ORAL | 0 refills | Status: AC

## 2023-08-02 NOTE — Telephone Encounter (Signed)
 Amitriptyline  30 tablets sent. Diazepam  has been refilled. Patient will need to follow-up with psychiatrist once they return to office,

## 2023-08-04 ENCOUNTER — Emergency Department (HOSPITAL_COMMUNITY)
Admission: EM | Admit: 2023-08-04 | Discharge: 2023-08-04 | Disposition: A | Discharge status: Home / Self Care | Payer: Medicare HMO | Attending: Emergency Medicine | Admitting: Emergency Medicine

## 2023-08-04 ENCOUNTER — Encounter (HOSPITAL_COMMUNITY): Payer: Self-pay

## 2023-08-04 ENCOUNTER — Other Ambulatory Visit: Payer: Self-pay

## 2023-08-04 ENCOUNTER — Emergency Department (HOSPITAL_COMMUNITY): Payer: Medicare HMO

## 2023-08-04 DIAGNOSIS — E039 Hypothyroidism, unspecified: Secondary | ICD-10-CM | POA: Diagnosis not present

## 2023-08-04 DIAGNOSIS — F32A Depression, unspecified: Secondary | ICD-10-CM | POA: Diagnosis not present

## 2023-08-04 DIAGNOSIS — M542 Cervicalgia: Secondary | ICD-10-CM | POA: Insufficient documentation

## 2023-08-04 DIAGNOSIS — Z8673 Personal history of transient ischemic attack (TIA), and cerebral infarction without residual deficits: Secondary | ICD-10-CM | POA: Insufficient documentation

## 2023-08-04 DIAGNOSIS — R9431 Abnormal electrocardiogram [ECG] [EKG]: Secondary | ICD-10-CM | POA: Insufficient documentation

## 2023-08-04 DIAGNOSIS — R220 Localized swelling, mass and lump, head: Secondary | ICD-10-CM | POA: Insufficient documentation

## 2023-08-04 DIAGNOSIS — R519 Headache, unspecified: Secondary | ICD-10-CM | POA: Diagnosis not present

## 2023-08-04 DIAGNOSIS — R42 Dizziness and giddiness: Secondary | ICD-10-CM | POA: Diagnosis not present

## 2023-08-04 DIAGNOSIS — G43809 Other migraine, not intractable, without status migrainosus: Secondary | ICD-10-CM | POA: Diagnosis not present

## 2023-08-04 DIAGNOSIS — M797 Fibromyalgia: Secondary | ICD-10-CM | POA: Diagnosis not present

## 2023-08-04 DIAGNOSIS — I10 Essential (primary) hypertension: Secondary | ICD-10-CM | POA: Insufficient documentation

## 2023-08-04 DIAGNOSIS — Z79899 Other long term (current) drug therapy: Secondary | ICD-10-CM | POA: Insufficient documentation

## 2023-08-04 DIAGNOSIS — J3489 Other specified disorders of nose and nasal sinuses: Secondary | ICD-10-CM | POA: Diagnosis not present

## 2023-08-04 DIAGNOSIS — Z20822 Contact with and (suspected) exposure to covid-19: Secondary | ICD-10-CM | POA: Insufficient documentation

## 2023-08-04 DIAGNOSIS — Z21 Asymptomatic human immunodeficiency virus [HIV] infection status: Secondary | ICD-10-CM | POA: Diagnosis not present

## 2023-08-04 LAB — RESP PANEL BY RT-PCR (RSV, FLU A&B, COVID)  RVPGX2
Influenza A by PCR: NEGATIVE
Influenza B by PCR: NEGATIVE
Resp Syncytial Virus by PCR: NEGATIVE
SARS Coronavirus 2 by RT PCR: NEGATIVE

## 2023-08-04 LAB — CBC
HCT: 41.7 % (ref 36.0–46.0)
Hemoglobin: 13.5 g/dL (ref 12.0–15.0)
MCH: 27.2 pg (ref 26.0–34.0)
MCHC: 32.4 g/dL (ref 30.0–36.0)
MCV: 83.9 fL (ref 80.0–100.0)
Platelets: 334 10*3/uL (ref 150–400)
RBC: 4.97 MIL/uL (ref 3.87–5.11)
RDW: 13.5 % (ref 11.5–15.5)
WBC: 9.1 10*3/uL (ref 4.0–10.5)
nRBC: 0 % (ref 0.0–0.2)

## 2023-08-04 LAB — URINALYSIS, ROUTINE W REFLEX MICROSCOPIC
Bilirubin Urine: NEGATIVE
Glucose, UA: NEGATIVE mg/dL
Hgb urine dipstick: NEGATIVE
Ketones, ur: NEGATIVE mg/dL
Leukocytes,Ua: NEGATIVE
Nitrite: NEGATIVE
Protein, ur: 30 mg/dL — AB
Specific Gravity, Urine: 1.018 (ref 1.005–1.030)
pH: 5 (ref 5.0–8.0)

## 2023-08-04 LAB — BASIC METABOLIC PANEL
Anion gap: 11 (ref 5–15)
BUN: 6 mg/dL (ref 6–20)
CO2: 25 mmol/L (ref 22–32)
Calcium: 8.8 mg/dL — ABNORMAL LOW (ref 8.9–10.3)
Chloride: 103 mmol/L (ref 98–111)
Creatinine, Ser: 0.97 mg/dL (ref 0.44–1.00)
GFR, Estimated: >60 mL/min (ref >60)
Glucose, Bld: 127 mg/dL — ABNORMAL HIGH (ref 70–99)
Potassium: 3.3 mmol/L — ABNORMAL LOW (ref 3.5–5.1)
Sodium: 139 mmol/L (ref 135–145)

## 2023-08-04 LAB — CBG MONITORING, ED: Glucose-Capillary: 134 mg/dL — ABNORMAL HIGH (ref 70–99)

## 2023-08-04 MED ORDER — GADOBUTROL 1 MMOL/ML IV SOLN
10.0000 mL | Freq: Once | INTRAVENOUS | Status: AC | PRN
  Administered 2023-08-04: 10 mL via INTRAVENOUS

## 2023-08-04 MED ORDER — KETOROLAC TROMETHAMINE 15 MG/ML IJ SOLN
15.0000 mg | Freq: Once | INTRAMUSCULAR | Status: AC
  Administered 2023-08-04: 15 mg via INTRAVENOUS
  Filled 2023-08-04: qty 1

## 2023-08-04 MED ORDER — LORAZEPAM 1 MG PO TABS
1.0000 mg | ORAL_TABLET | Freq: Once | ORAL | Status: AC
  Administered 2023-08-04: 1 mg via ORAL
  Filled 2023-08-04: qty 1

## 2023-08-04 MED ORDER — DIPHENHYDRAMINE HCL 25 MG PO CAPS
25.0000 mg | ORAL_CAPSULE | Freq: Once | ORAL | Status: AC
  Administered 2023-08-04: 25 mg via ORAL
  Filled 2023-08-04: qty 1

## 2023-08-04 MED ORDER — MECLIZINE HCL 25 MG PO TABS
25.0000 mg | ORAL_TABLET | Freq: Once | ORAL | Status: AC
  Administered 2023-08-04: 25 mg via ORAL
  Filled 2023-08-04: qty 1

## 2023-08-04 MED ORDER — MECLIZINE HCL 25 MG PO TABS: 25.0000 mg | ORAL_TABLET | Freq: Three times a day (TID) | ORAL | 0 refills | Status: AC | PRN

## 2023-08-04 MED ORDER — PROCHLORPERAZINE EDISYLATE 10 MG/2ML IJ SOLN
10.0000 mg | INTRAMUSCULAR | Status: AC
  Administered 2023-08-04: 10 mg via INTRAVENOUS
  Filled 2023-08-04: qty 2

## 2023-08-04 MED ORDER — SODIUM CHLORIDE 0.9 % IV BOLUS
1000.0000 mL | Freq: Once | INTRAVENOUS | Status: AC
  Administered 2023-08-04: 1000 mL via INTRAVENOUS

## 2023-08-04 NOTE — ED Triage Notes (Addendum)
 Complains of dizziness that started yesterday and foggy brain.  Reports pain at base of skull.  Patient also complains of nasal congestion.  Reports it feels like everything is spinning. No neuro deficits noted  Also complains of swelling to right jaw close to ear.

## 2023-08-04 NOTE — Discharge Instructions (Addendum)
 It was a pleasure taking part in your care.  As we discussed, your workup is reassuring.  We gave you a migraine cocktail for the pain in your head as well as a medication called meclizine  for your dizziness.  You report that these improved your symptoms.  Please follow-up with your PCP for reevaluation.  Please pick up meclizine  sent to your pharmacy and take this 3 times a day as needed for dizziness.  Please continue taking all prescribed medications as directed.  Return to the ED with any new or worsening symptoms.

## 2023-08-04 NOTE — ED Provider Notes (Signed)
 [place] [facility] AT [facility] Provider Note   CSN: [id number] Arrival date & time: 08/04/23  9078     History  Chief Complaint  Patient presents with   Dizziness    Brianna Roberts is a 58 y.o. female with medical history significant for TIA, thyroid  disease, hypothyroidism, hypertension, HIV, depression, fibromyalgia.  The patient presents to the ED for evaluation of dizziness, right sided jaw swelling.  The patient reports that beginning yesterday she developed pressure in the front of her face that slowly began to wrap around her entire head developing into pain that located at the base of her neck.  She reports that this pain is worse with movement, better with rest.  She reports that she went to bed with this pain in the base of her neck and woke up this morning with dizziness described as the room spinning.  States that she has a history of dizziness but states that this dizziness she is experiencing today is more severe.  Reports almost falling at home but did catch herself before falling.  Also reporting right jaw swelling that has been ongoing for the last year.  Her chart was reviewed and it seems as if the patient may have chronic parotitis but has been unable to receive CT scan secondary to insurance issues.  She is unsure of a history of vertigo but states that she has had dizziness in the past.  She denies one-sided weakness or numbness.  Denies neck pain, dysphagia, double vision, word finding difficulty, nausea, vomiting, abdominal pain, chest pain, shortness of breath.  Denies fevers at home, trouble swallowing.   Dizziness Associated symptoms: headaches        Home Medications Prior to Admission medications   Medication Sig Start Date End Date Taking? Authorizing Provider  meclizine  (ANTIVERT ) 25 MG tablet Take 1 tablet (25 mg total) by mouth 3 (three) times daily as needed for dizziness. 08/04/23  Yes Ruthell Lonni FALCON, PA-C  acetaminophen   (TYLENOL ) 325 MG tablet Take 650 mg by mouth every 6 (six) hours as needed (for pain).    [provider]  albuterol  (VENTOLIN  HFA) 108 (90 Base) MCG/ACT inhaler Inhale 1-2 puffs into the lungs every 6 (six) hours as needed for wheezing or shortness of breath. 01/23/23   Raford Lenis, MD  amitriptyline  (ELAVIL ) 100 MG tablet Take 1 tablet (100 mg total) by mouth at bedtime. 08/02/23   Masters, Izetta, DO  amLODipine  (NORVASC ) 10 MG tablet Take 1 tablet (10 mg total) by mouth daily. 03/26/23   Jolaine Pac, DO  ASPERCREME LIDOCAINE EX Apply 1 application topically 2 (two) times daily as needed (for muscle pain). Patient not taking: Reported on 01/24/2023    [provider]  azelastine  (ASTELIN ) 0.1 % nasal spray USE 2 SPRAYS EACH NOSTRIL TWICE DAILY. Patient taking differently: Place 2 sprays into both nostrils 2 (two) times daily. 10/15/17   Juliane Che, PA  Blood Glucose Monitoring Suppl (ONETOUCH VERIO) w/Device KIT 1 each by Does not apply route as needed. 03/28/23   Jolaine Pac, DO  clobetasol ointment (TEMOVATE) 0.05 % Apply 1 application topically as needed (for uritcaria). 09/12/18   [provider]  diazepam  (VALIUM ) 5 MG tablet Take 1 tablet (5 mg total) by mouth every 8 (eight) hours as needed for anxiety. 07/29/23   Marylu Gee, DO  fluticasone  (FLONASE ) 50 MCG/ACT nasal spray Place 1 spray into both nostrils daily. 03/26/23   Jolaine Pac, DO  Fluticasone -Umeclidin-Vilant (TRELEGY ELLIPTA ) 200-62.5-25  MCG/ACT AEPB Inhale 1 Inhalation into the lungs daily at 6 (six) AM. 03/26/23   Jolaine Pac, DO  furosemide  (LASIX ) 40 MG tablet Take 1 tablet (40 mg total) by mouth 2 (two) times daily. 03/26/23   Jolaine Pac, DO  glucose blood test strip Use as instructed to check blood sugar three times daily 04/01/23   Gabino Boga, MD  hydrOXYzine  (ATARAX ) 10 MG tablet Take 1-2.5 tablets (10-25 mg total) by mouth every 8 (eight) hours as needed. 04/04/23 10/01/23   Jolaine Pac, DO  Incontinence Supply Disposable (PREVAIL WASHCLOTHS) MISC Use daily as directed 06/03/17   Juliane Che, PA  Lancets Thin MISC Use to check home glucose three times daily 04/01/23   Gabino Boga, MD  levothyroxine  (SYNTHROID ) 50 MCG tablet Take 1 tablet (50 mcg total) by mouth daily before breakfast. 03/06/23   Tobie Gaines, DO  loperamide  (IMODIUM ) 2 MG capsule Take 1 capsule (2 mg total) by mouth as needed for diarrhea or loose stools. 02/27/23   Tobie Gaines, DO  metoprolol  tartrate (LOPRESSOR ) 100 MG tablet Take 1 tablet (100 mg total) by mouth 2 (two) times daily. 03/26/23   Jolaine Pac, DO  Nerve Stimulator (PRO COMFORT TENS UNIT) DEVI 1 Device by Does not apply route daily as needed. 12/27/16   Juliane Che, PA  nystatin  cream (MYCOSTATIN ) Apply 1 Application topically 2 (two) times daily. 03/26/23   Jolaine Pac, DO  ondansetron  (ZOFRAN -ODT) 8 MG disintegrating tablet Take 1 tablet (8 mg total) by mouth every 8 (eight) hours as needed for nausea or vomiting. 01/25/23   Tobie Gaines, DO  potassium chloride  SA (KLOR-CON  M) 20 MEQ tablet Take 1 tablet (20 mEq total) by mouth 2 (two) times daily. 01/23/23   Raford Lenis, MD  Skin Protectants, Misc. (EUCERIN) cream Apply topically as needed for dry skin. 01/30/23   Marylu Gee, DO  triamcinolone  ointment (KENALOG ) 0.5 % Apply 1 application topically daily.    [provider]  valACYclovir  (VALTREX ) 1000 MG tablet Take 1 tablet (1,000 mg total) by mouth daily. 03/26/23   Jolaine Pac, DO  vitamin B-12 (CYANOCOBALAMIN ) 100 MCG tablet Take 1 tablet (100 mcg total) by mouth once a week. 03/26/23   Jolaine Pac, DO  Vitamin D, Ergocalciferol, (DRISDOL) 1.25 MG (50000 UNIT) CAPS capsule Take 50,000 Units by mouth once a week. On Saturday 03/07/21   [provider]      Allergies    Hydromorphone, Iodinated contrast media, Sulfa antibiotics, Codeine, Nickel, Penicillins, Spironolactone, Cefdinir, and  Adhesive [tape]    Review of Systems   Review of Systems  HENT:  Positive for sinus pressure.   Musculoskeletal:  Negative for neck pain.  Neurological:  Positive for dizziness and headaches.    Physical Exam Updated Vital Signs BP (!) 154/82 (BP Location: Right Arm)   Pulse 87   Temp 98.5 F (36.9 C)   Resp 20   Ht 5' 9 (1.753 m)   Wt (!) 170.1 kg   SpO2 96%   BMI 55.38 kg/m  Physical Exam Vitals and nursing note reviewed.  Constitutional:      General: She is not in acute distress.    Appearance: Normal appearance. She is not ill-appearing, toxic-appearing or diaphoretic.  HENT:     Head: Normocephalic and atraumatic.     Nose: Nose normal.     Mouth/Throat:     Mouth: Mucous membranes are moist.     Pharynx: Oropharynx is clear.  Eyes:     Extraocular  Movements: Extraocular movements intact.     Conjunctiva/sclera: Conjunctivae normal.     Pupils: Pupils are equal, round, and reactive to light.  Neck:   Cardiovascular:     Rate and Rhythm: Normal rate and regular rhythm.  Pulmonary:     Effort: Pulmonary effort is normal.     Breath sounds: Normal breath sounds. No wheezing.  Abdominal:     General: Abdomen is flat. Bowel sounds are normal.     Palpations: Abdomen is soft.     Tenderness: There is no abdominal tenderness.  Musculoskeletal:     Cervical back: Normal range of motion and neck supple. No tenderness.  Skin:    General: Skin is warm and dry.     Capillary Refill: Capillary refill takes less than 2 seconds.  Neurological:     General: No focal deficit present.     Mental Status: She is alert and oriented to person, place, and time.     GCS: GCS eye subscore is 4. GCS verbal subscore is 5. GCS motor subscore is 6.     Cranial Nerves: Cranial nerves 2-12 are intact. No cranial nerve deficit.     Sensory: Sensation is intact. No sensory deficit.     Motor: Motor function is intact. No weakness.     Coordination: Coordination is intact. Heel to  [facility] Test normal.     Comments: CN III through XII intact.  Intact finger-nose, heel-to-shin.  No pronator drift.  No slurred speech, facial droop.  5 out of 5 strength bilateral upper extremities.  5 out of 5 strength bilateral lower extremities.     ED Results / Procedures / Treatments   Labs (all labs ordered are listed, but only abnormal results are displayed) Labs Reviewed  BASIC METABOLIC PANEL - Abnormal; Notable for the following components:      Result Value   Potassium 3.3 (*)    Glucose, Bld 127 (*)    Calcium 8.8 (*)    All other components within normal limits  URINALYSIS, ROUTINE W REFLEX MICROSCOPIC - Abnormal; Notable for the following components:   APPearance HAZY (*)    Protein, ur 30 (*)    Bacteria, UA MANY (*)    All other components within normal limits  CBG MONITORING, ED - Abnormal; Notable for the following components:   Glucose-Capillary 134 (*)    All other components within normal limits  RESP PANEL BY RT-PCR (RSV, FLU A&B, COVID)  [id number]  CBC    EKG EKG Interpretation Date/Time:  Sunday August 04 2023 09:38:09 EST Ventricular Rate:  90 PR Interval:  142 QRS Duration:  86 QT Interval:  378 QTC Calculation: 462 R Axis:   17  Text Interpretation: Normal sinus rhythm Nonspecific T wave abnormality Prolonged QT Abnormal ECG When compared with ECG of 23-Jan-2023 00:35, PREVIOUS ECG IS PRESENT No significant change since last tracing Confirmed by Dean Clarity [phone]) on 08/04/2023 9:41:00 AM  Radiology MR Brain W and Wo Contrast Result Date: 08/04/2023 CLINICAL DATA:  Headache, increasing frequency or severity. EXAM: MRI HEAD WITHOUT AND WITH CONTRAST TECHNIQUE: Multiplanar, multiecho pulse sequences of the brain and surrounding structures were obtained without and with intravenous contrast. CONTRAST:  10mL GADAVIST  GADOBUTROL  1 MMOL/ML IV SOLN COMPARISON:  CT head without contrast 07/17/2022. MR head without contrast 12/13/17. FINDINGS: Brain: No acute  infarct, hemorrhage, or mass lesion is present. No significant white matter lesions are present. Deep brain nuclei are within normal limits. The ventricles are of normal  size. No significant extraaxial fluid collection is present. The brainstem and cerebellum are within normal limits. The internal auditory canals are within normal limits. Midline structures are within normal limits. Vascular: Flow is present in the major intracranial arteries. Skull and upper cervical spine: The craniocervical junction is normal. Upper cervical spine is within normal limits. Marrow signal is unremarkable. Sinuses/Orbits: The paranasal sinuses and mastoid air cells are clear. The globes and orbits are within normal limits. IMPRESSION: Normal MRI appearance of the brain. No acute or focal lesion to explain the patient's symptoms. Electronically Signed   By: Lonni Necessary M.D.   On: 08/04/2023 13:59    Procedures Procedures    Medications Ordered in ED Medications  meclizine  (ANTIVERT ) tablet 25 mg (25 mg Oral Given 08/04/23 1055)  LORazepam  (ATIVAN ) tablet 1 mg (1 mg Oral Given 08/04/23 1055)  sodium chloride  0.9 % bolus 1,000 mL (0 mLs Intravenous Stopped 08/04/23 1231)  prochlorperazine  (COMPAZINE ) injection 10 mg (10 mg Intravenous Given 08/04/23 1212)  diphenhydrAMINE  (BENADRYL ) capsule 25 mg (25 mg Oral Given 08/04/23 1212)  ketorolac  (TORADOL ) 15 MG/ML injection 15 mg (15 mg Intravenous Given 08/04/23 1212)  gadobutrol  (GADAVIST ) 1 MMOL/ML injection 10 mL (10 mLs Intravenous Contrast Given 08/04/23 1349)    ED Course/ Medical Decision Making/ A&P  Medical Decision Making Amount and/or Complexity of Data Reviewed Labs: ordered.   58 year old female presents for evaluation.  Please see HPI for further details.  On examination the patient is afebrile and nontachycardic.  Her lung sounds are clear bilaterally, she is not hypoxic.  Abdomen soft and compressible throughout.  Neurological examinations at baseline  without focal neurodeficits.  Patient overall nontoxic appearance.  Patient initially provided with 1 L of fluid, meclizine  and Ativan  for dizziness.  On reassessment, reports that her dizziness has improved however now she is complaining of pain to the posterior part of her head.  Also reporting facial pain which sounds like sinus pressure to me.  States that this became apparent 1 day ago.  Patient CBC without leukocytosis or anemia.  Metabolic panel with potassium 3.3, glucose 127, anion gap 11, no other electrolyte derangement.  Patient urinalysis unremarkable, patient denies dysuria.  Her MRI brain with and without contrast is also unremarkable.  Patient was given migraine cocktail and states that at this time her head pain has improved, her dizziness has improved.  Patient could be suffering from complex migraine versus typical migraine.  Do believe patient has small component of vertigo contributing to her symptoms.  Will send her home with meclizine .  Will have her follow-up with her PCP.  She was encouraged to return to the ED with any new or worsening signs or symptoms.  For sinus pressure, advised her to trial conservative measures at home such as sinus rinses and decongestants.  She voiced understanding.  She had all of her questions answered her satisfaction.  Stable to discharge home.   Final Clinical Impression(s) / ED Diagnoses Final diagnoses:  Dizziness  Other migraine without status migrainosus, not intractable  Sinus pressure    Rx / DC Orders ED Discharge Orders          Ordered    meclizine  (ANTIVERT ) 25 MG tablet  3 times daily PRN        08/04/23 1432              Ruthell Lonni FALCON, PA-C 08/04/23 1432    Dean Clarity, MD 08/04/23 1620

## 2023-08-05 ENCOUNTER — Other Ambulatory Visit: Payer: Self-pay | Admitting: Student

## 2023-08-05 ENCOUNTER — Telehealth: Payer: Self-pay | Admitting: *Deleted

## 2023-08-05 DIAGNOSIS — Z79899 Other long term (current) drug therapy: Secondary | ICD-10-CM | POA: Diagnosis not present

## 2023-08-05 MED ORDER — NITROFURANTOIN MONOHYD MACRO 100 MG PO CAPS: 100.0000 mg | ORAL_CAPSULE | Freq: Two times a day (BID) | ORAL | 0 refills | Status: AC

## 2023-08-05 NOTE — Telephone Encounter (Signed)
 Call from patient went to the ER .  Urine Specimen was done.  States read in the after visit that she had a UTI.  Was not treated .  Has been having symptoms of burning for over a week.  Lower back pain.  No fever.. States Urine is Cloudy.  Does have problems voiding .  Wants to know if she can get something for the infection.

## 2023-08-07 ENCOUNTER — Encounter: Payer: Medicare HMO | Admitting: Student

## 2023-08-08 DIAGNOSIS — F411 Generalized anxiety disorder: Secondary | ICD-10-CM | POA: Diagnosis not present

## 2023-08-08 DIAGNOSIS — F341 Dysthymic disorder: Secondary | ICD-10-CM | POA: Diagnosis not present

## 2023-08-13 ENCOUNTER — Encounter: Payer: Medicare HMO | Admitting: Student

## 2023-08-15 DIAGNOSIS — F411 Generalized anxiety disorder: Secondary | ICD-10-CM | POA: Diagnosis not present

## 2023-08-15 DIAGNOSIS — F341 Dysthymic disorder: Secondary | ICD-10-CM | POA: Diagnosis not present

## 2023-08-19 DIAGNOSIS — F341 Dysthymic disorder: Secondary | ICD-10-CM | POA: Diagnosis not present

## 2023-08-19 DIAGNOSIS — F411 Generalized anxiety disorder: Secondary | ICD-10-CM | POA: Diagnosis not present

## 2023-08-26 DIAGNOSIS — F341 Dysthymic disorder: Secondary | ICD-10-CM | POA: Diagnosis not present

## 2023-08-26 DIAGNOSIS — F411 Generalized anxiety disorder: Secondary | ICD-10-CM | POA: Diagnosis not present

## 2023-08-30 ENCOUNTER — Ambulatory Visit: Payer: Medicare HMO | Admitting: Student

## 2023-08-30 ENCOUNTER — Encounter: Payer: Self-pay | Admitting: Student

## 2023-08-30 ENCOUNTER — Other Ambulatory Visit: Payer: Self-pay

## 2023-08-30 ENCOUNTER — Telehealth: Payer: Self-pay | Admitting: Student

## 2023-08-30 VITALS — BP 124/75 | HR 71 | Temp 98.3°F | Ht 69.0 in | Wt 360.9 lb

## 2023-08-30 DIAGNOSIS — Z79899 Other long term (current) drug therapy: Secondary | ICD-10-CM

## 2023-08-30 DIAGNOSIS — N3000 Acute cystitis without hematuria: Secondary | ICD-10-CM | POA: Diagnosis not present

## 2023-08-30 DIAGNOSIS — J441 Chronic obstructive pulmonary disease with (acute) exacerbation: Secondary | ICD-10-CM

## 2023-08-30 DIAGNOSIS — N200 Calculus of kidney: Secondary | ICD-10-CM

## 2023-08-30 DIAGNOSIS — Z23 Encounter for immunization: Secondary | ICD-10-CM | POA: Diagnosis not present

## 2023-08-30 DIAGNOSIS — Z0000 Encounter for general adult medical examination without abnormal findings: Secondary | ICD-10-CM

## 2023-08-30 DIAGNOSIS — F32A Depression, unspecified: Secondary | ICD-10-CM

## 2023-08-30 DIAGNOSIS — E119 Type 2 diabetes mellitus without complications: Secondary | ICD-10-CM | POA: Diagnosis not present

## 2023-08-30 LAB — POCT GLYCOSYLATED HEMOGLOBIN (HGB A1C): Hemoglobin A1C: 6.5 % — AB (ref 4.0–5.6)

## 2023-08-30 LAB — GLUCOSE, CAPILLARY: Glucose-Capillary: 127 mg/dL — ABNORMAL HIGH (ref 70–99)

## 2023-08-30 MED ORDER — POTASSIUM CHLORIDE CRYS ER 10 MEQ PO TBCR
10.0000 meq | EXTENDED_RELEASE_TABLET | Freq: Two times a day (BID) | ORAL | 3 refills | Status: DC
Start: 2023-08-30 — End: 2023-09-02

## 2023-08-30 MED ORDER — TRIAMCINOLONE 0.1 % CREAM:EUCERIN CREAM 1:1: 1.0000 | TOPICAL_CREAM | Freq: Every day | CUTANEOUS | 3 refills | Status: DC | PRN

## 2023-08-30 MED ORDER — TRELEGY ELLIPTA 200-62.5-25 MCG/ACT IN AEPB
1.0000 | INHALATION_SPRAY | Freq: Every day | RESPIRATORY_TRACT | 1 refills | Status: AC
Start: 2023-08-30 — End: ?

## 2023-08-30 MED ORDER — OZEMPIC (0.25 OR 0.5 MG/DOSE) 2 MG/1.5ML ~~LOC~~ SOPN: 0.2500 mg | PEN_INJECTOR | SUBCUTANEOUS | 1 refills | Status: DC

## 2023-08-30 NOTE — Progress Notes (Signed)
 CC: Follow-up  HPI:  [name] is a 58 y.o. female living with a history stated below and presents today for follow-up. Please see problem based assessment and plan for additional details.  Past Medical History:  Diagnosis Date   Allergic rhinitis 10/05/2016   Perennial.  "To everything."  Has seen allergists and had testing, which indicated that her only allergy is to mouse hair.     Allergy    Anxiety    Arthritis    Asthma    Asthma, chronic 10/05/2016   Reports annual bronchial infection requiring hospitalization.     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 Sharp pain in bilateral frontal and temple area of the head. Pain is constant and waxes and wanes. 7/10 to 10/10. +photophobia. Intermittent left eye vision loss.    History:  MRI Of brain was unremarkable. Pt still suffers from cognitive issues. She states because of insurance change she has not been able to follow up with her re   Neuromuscular disorder (HCC)    Ovarian cyst 10/05/2016   RIGHT sided.  Causes pain.     Positive HTLV-1 antibody 04/22/2023   Thyroid disease    TIA (transient ischemic attack)     Current Outpatient Medications on File Prior to Visit  Medication Sig Dispense Refill   albuterol (VENTOLIN HFA) 108 (90 Base) MCG/ACT inhaler Inhale 1-2 puffs into the lungs every 6 (six) hours as needed for wheezing or shortness of breath. 1 each 0   amitriptyline (ELAVIL) 100 MG tablet Take 1 tablet (100 mg total) by mouth at bedtime. 30 tablet 0   amLODipine (NORVASC) 10 MG tablet Take 1 tablet (10  mg total) by mouth daily. 90 tablet 3   Blood Glucose Monitoring Suppl (ONETOUCH VERIO) w/Device KIT 1 each by Does not apply route as needed. 1 kit 0   fluticasone (FLONASE) 50 MCG/ACT nasal spray Place 1 spray into both nostrils daily. 16 g 11   furosemide (LASIX) 40 MG tablet Take 1 tablet (40 mg total) by mouth 2 (two) times daily. 60 tablet 11   glucose blood test strip Use as instructed to check blood sugar three times daily 100 each prn   Lancets Thin MISC Use to check home glucose three times daily 100 each prn   levothyroxine (SYNTHROID) 50 MCG tablet Take 1 tablet (50 mcg total) by mouth daily before breakfast. 90 tablet 3   meclizine (ANTIVERT) 25 MG tablet Take 1 tablet (25 mg total) by mouth 3 (three) times daily as needed for dizziness. 30 tablet 0   metoprolol tartrate (LOPRESSOR) 100 MG tablet Take 1 tablet (100 mg total) by mouth 2 (two) times daily. 180 tablet 3   Nerve Stimulator (PRO COMFORT TENS UNIT) DEVI 1 Device by Does not apply route daily as needed. 1 Device 1   nystatin cream (MYCOSTATIN) Apply 1 Application topically 2 (two) times daily. 30 g 11   ondansetron (ZOFRAN-ODT) 8 MG disintegrating tablet Take 1 tablet (8 mg total) by mouth every 8 (eight) hours  as needed for nausea or vomiting. 20 tablet 0   valACYclovir (VALTREX) 1000 MG tablet Take 1 tablet (1,000 mg total) by mouth daily. 30 tablet 3   No current facility-administered medications on file prior to visit.    Family History  Problem Relation Age of Onset   Diabetes Mother    Heart disease Mother    Hyperlipidemia Mother    Hypertension Mother    Stroke Mother    Mental illness Mother    Heart failure Mother    Headache Mother        Joellyn Quails Cyst   Breast cancer Mother 21       diagnosed 3 times   Heart disease Father    Alcohol abuse Father    Hypertension Sister    Heart disease Brother    Mental illness Brother    COPD Brother    Heart failure Brother    Hypertension Sister    Colon  cancer Neg Hx     Social History   Socioeconomic History   Marital status: Widowed    Spouse name: Newman Pies   Number of children: 0   Years of education: college+   Highest education level: Bachelor's degree (e.g., BA, AB, BS)  Occupational History   Occupation: disability    Comment: fibromyalgia  Tobacco Use   Smoking status: Every Day    Current packs/day: 0.30    Average packs/day: 0.3 packs/day for 33.0 years (9.9 ttl pk-yrs)    Types: Cigarettes   Smokeless tobacco: Never  Vaping Use   Vaping status: Never Used  Substance and Sexual Activity   Alcohol use: No   Drug use: No   Sexual activity: Never  Other Topics Concern   Not on file  Social History Narrative   Moved to [place], [place] from [place], [place] 04/28/2016 in need of a change.   Her family remains in [place].   Lives alone.   Social Drivers of Corporate investment banker Strain: High Risk (01/28/2023)   Overall Financial Resource Strain (CARDIA)    Difficulty of Paying Living Expenses: Very hard  Food Insecurity: Food Insecurity Present (01/28/2023)   Hunger Vital Sign    Worried About Running Out of Food in the Last Year: Often true    Ran Out of Food in the Last Year: Often true  Transportation Needs: No Transportation Needs (01/28/2023)   PRAPARE - Administrator, Civil Service (Medical): No    Lack of Transportation (Non-Medical): No  Recent Concern: Transportation Needs - Unmet Transportation Needs (11/07/2022)   Received from [facility]   PRAPARE - Transportation    Lack of Transportation (Medical): No    Lack of Transportation (Non-Medical): Yes  Physical Activity: Unknown (01/28/2023)   Exercise Vital Sign    Days of Exercise per Week: 0 days    Minutes of Exercise per Session: Not on file  Stress: Stress Concern Present (01/28/2023)   [employer] of Occupational Health - Occupational Stress Questionnaire    Feeling of Stress : Rather much  Social  Connections: Socially Isolated (01/28/2023)   Social Connection and Isolation Panel [NHANES]    Frequency of Communication with Friends and Family: Three times a week    Frequency of Social Gatherings with Friends and Family: Never    Attends Religious Services: Never    Database administrator or Organizations: No    Attends Banker Meetings: Not on file    Marital  Status: Widowed  Intimate Partner Violence: Not At Risk (11/07/2022)   Received from [facility]   HITS    Over the last 12 months how often did your partner physically hurt you?: Never    Over the last 12 months how often did your partner insult you or talk down to you?: Never    Over the last 12 months how often did your partner threaten you with physical harm?: Never    Over the last 12 months how often did your partner scream or curse at you?: Never    Review of Systems: ROS negative except for what is noted on the assessment and plan.  Vitals:   08/30/23 1025  BP: 124/75  Pulse: 71  Temp: 98.3 F (36.8 C)  TempSrc: Oral  SpO2: 99%  Weight: (!) 360 lb 14.4 oz (163.7 kg)  Height: 5\' 9"  (1.753 m)    Physical Exam: Constitutional: obese, sitting in wheelchair, in no acute distress Cardiovascular: regular rate and rhythm, no m/r/g, no LEE Pulmonary/Chest: normal work of breathing on room air, lungs clear to auscultation bilaterally Abdominal: distended but soft, no TTP Skin: warm and dry   Assessment & Plan:   Patient discussed with Dr. Lafonda Mosses  Type 2 diabetes mellitus without complication, without long-term current use of insulin (HCC) A1c is 6.5. Diet controlled. She has failed Metformin in the past. Given morbid obesity will start Ozempic 0.25/weekly. -BMP  Healthcare maintenance Tdap today  Nephrolithiasis She has a long-standing history of kidney stone complaints. Per chart review, imaging has been performed multiple times without evidence of a stone but she states there  was documented visualization in the past. She endorses right inguinal pain and dysuria. Denies fever, chills. -UA with reflex culture   Carmina Miller, D.O. [facility] Internal Medicine, PGY-1 Phone: [phone] Date 09/03/2023 Time 1:48 PM

## 2023-08-30 NOTE — Telephone Encounter (Signed)
 Copied from CRM [id number]. Topic: Clinical - Medication Question >> Aug 30, 2023 11:54 AM Philippa Chester F wrote: Reason for CRM:   Medication in question: Semaglutide,0.25 or 0.5MG /DOS, (OZEMPIC, 0.25 OR 0.5 MG/DOSE,) 2 MG/1.5ML SOPN  Pharmacy: [facility] [address], [place] - [address] [place], [place] [address] Phone: [phone]  Fax: [phone]    Patient [facility] Pharmacist, Fonda Kinder, called in with questions regarding the patient's Ozempic medication.   1. The clinic sent in a script for 1.5 ml pen but the new pen is a 3.0 ml pen  Script must read 3.0 ml pen for script to be filled.  2. Their concern regarding the directions on the script were that the pen direction were "Inject 0.25 mg into the skin once a week." Typically the pharmacy noted the script would increase to 0.5 mg after four weeks. So they wanted to confirm whether or not the script would stay the same after the current script is finished.

## 2023-08-30 NOTE — Telephone Encounter (Signed)
 Copied from CRM [id number]. Topic: Clinical - Prescription Issue >> Aug 30, 2023 11:34 AM Alfred Levins wrote: Reason for CRM: Semaglutide,0.25 or 0.5MG /DOS, (OZEMPIC, 0.25 OR 0.5 MG/DOSE,) 2 MG/1.5ML SOPN  Per Drai at [facility] [address] [place] - [address] [place] [address] Phone: [phone] Fax: [phone] Hours: Not open 24 hours  There has been a change in dosing   Old: 2MG /1.5ML   New: 2MG /3ML  Please advise

## 2023-08-31 LAB — BASIC METABOLIC PANEL WITH GFR
BUN/Creatinine Ratio: 7 — ABNORMAL LOW (ref 9–23)
BUN: 6 mg/dL (ref 6–24)
CO2: 22 mmol/L (ref 20–29)
Calcium: 9.1 mg/dL (ref 8.7–10.2)
Chloride: 103 mmol/L (ref 96–106)
Creatinine, Ser: 0.84 mg/dL (ref 0.57–1.00)
Glucose: 112 mg/dL — ABNORMAL HIGH (ref 70–99)
Potassium: 3.7 mmol/L (ref 3.5–5.2)
Sodium: 142 mmol/L (ref 134–144)
eGFR: 81 mL/min/1.73

## 2023-09-02 ENCOUNTER — Telehealth: Payer: Self-pay | Admitting: *Deleted

## 2023-09-02 ENCOUNTER — Ambulatory Visit: Payer: Self-pay | Admitting: Student

## 2023-09-02 ENCOUNTER — Encounter: Payer: Self-pay | Admitting: Student

## 2023-09-02 ENCOUNTER — Other Ambulatory Visit: Payer: Self-pay

## 2023-09-02 ENCOUNTER — Other Ambulatory Visit: Payer: Self-pay | Admitting: Student

## 2023-09-02 LAB — MICROSCOPIC EXAMINATION
Casts: NONE SEEN /LPF
RBC, Urine: NONE SEEN /HPF (ref 0–2)

## 2023-09-02 LAB — UA/M W/RFLX CULTURE, COMP
Bilirubin, UA: NEGATIVE
Glucose, UA: NEGATIVE
Ketones, UA: NEGATIVE
Leukocytes,UA: NEGATIVE
Nitrite, UA: NEGATIVE
RBC, UA: NEGATIVE
Specific Gravity, UA: 1.016 (ref 1.005–1.030)
Urobilinogen, Ur: 0.2 mg/dL (ref 0.2–1.0)
pH, UA: 6 (ref 5.0–7.5)

## 2023-09-02 LAB — URINE CULTURE, COMPREHENSIVE

## 2023-09-02 MED ORDER — SEMAGLUTIDE (1 MG/DOSE) 4 MG/3ML ~~LOC~~ SOPN: 0.2500 mg | PEN_INJECTOR | SUBCUTANEOUS | 2 refills | Status: DC

## 2023-09-02 MED ORDER — TRIAMCINOLONE 0.1 % CREAM:EUCERIN CREAM 1:1: 1.0000 | TOPICAL_CREAM | Freq: Every day | CUTANEOUS | 3 refills | Status: DC | PRN

## 2023-09-02 MED ORDER — POTASSIUM CHLORIDE CRYS ER 10 MEQ PO TBCR: 10.0000 meq | EXTENDED_RELEASE_TABLET | Freq: Two times a day (BID) | ORAL | 3 refills | Status: AC

## 2023-09-02 NOTE — Telephone Encounter (Signed)
 Copied from CRM [id number]. Topic: Clinical - Prescription Issue >> Sep 02, 2023  4:47 PM Claudine Mouton wrote: Brianna Roberts with [facility] called to advise that while she understands the instructions idicate that patient should begin with starter dose of 1mg  pen, instructions do not line up with prescription submitted. Please re-submit.

## 2023-09-02 NOTE — Telephone Encounter (Signed)
  Chief Complaint: Requesting lab results, medication issue  Disposition: [] ED /[] Urgent Care (no appt availability in office) / [] Appointment(In office/virtual)/ []  [place] [facility]/ [] Home Care/ [] Refused Recommended Disposition /[] [place] [facility]/ []  Follow-up with PCP Additional Notes: Patient calls requesting lab results from visit on 08/30/23, states she believes she has a kidney stone. She also states that there are issues with medications sent to pharmacy during that visit and she is unable to obtain them. Patient is requesting a callback with results and plan of care. Alerting PCP for review and f/u.   Reason for Disposition  [1] Caller requesting NON-URGENT health information AND [2] PCP's office is the best resource  Answer Assessment - Initial Assessment Questions 1. REASON FOR CALL or QUESTION: "What is your reason for calling today?" or "How can I best help you?" or "What question do you have that I can help answer?"     Patient calling requesting lab results- also states that she has not been able to receive prescriptions, reports pharmacy is stating they are incorrect.  Protocols used: Information Only Call - No Triage-A-AH

## 2023-09-02 NOTE — Telephone Encounter (Signed)
 Copied from CRM [id number]. Topic: Clinical - Prescription Issue >> Sep 02, 2023  4:47 PM Claudine Mouton wrote: Dorathy Daft with [facility] called to advise that while she understands the instructions idicate that patient should begin with starter dose of 1mg  pen, instructions do not line up with prescription submitted. Please re-submit.

## 2023-09-02 NOTE — Telephone Encounter (Signed)
 Copied from CRM [id number]. Topic: Clinical - Lab/Test Results >> Sep 02, 2023  3:17 PM Alvino Blood C wrote: Reason for CRM: Patient is wanting a call back to discuss her test results. She wants to know if she needs to wait it out with her kidney stones. Patients call back # is [phone] >> Sep 02, 2023  3:28 PM Nurse Sherlean Foot wrote: Will forward to PCP to discuss results. >> Sep 02, 2023  3:24 PM Nurse Sherlean Foot wrote: Will forward to PCP

## 2023-09-02 NOTE — Telephone Encounter (Signed)
 See note from Pharmacy asking about dosing of the [facility] for patient.

## 2023-09-02 NOTE — Telephone Encounter (Signed)
 Received message from E2C2 that pt was having difficulty "sending messages" to Dr Annie Paras. (Although Dr [name] is an option) CMA contacted patient for further clarification  Pt states Dr Annie Paras is not an option to send messages to in My Chart CMA reviewed pt's current My Chart communication preferences and was unable to resolve issue.  Pt also had to disconnect from call as she was receiving a call from Dr Annie Paras.  Will offer my assistance to patient at another time.Kingsley Spittle Cassady3/10/20254:37 PM

## 2023-09-03 ENCOUNTER — Other Ambulatory Visit: Payer: Self-pay | Admitting: Student

## 2023-09-03 ENCOUNTER — Telehealth: Payer: Self-pay | Admitting: *Deleted

## 2023-09-03 DIAGNOSIS — F341 Dysthymic disorder: Secondary | ICD-10-CM | POA: Diagnosis not present

## 2023-09-03 DIAGNOSIS — Z79899 Other long term (current) drug therapy: Secondary | ICD-10-CM | POA: Insufficient documentation

## 2023-09-03 DIAGNOSIS — F411 Generalized anxiety disorder: Secondary | ICD-10-CM | POA: Diagnosis not present

## 2023-09-03 MED ORDER — TRIAMCINOLONE 0.1 % CREAM:EUCERIN CREAM 1:1: 1.0000 | TOPICAL_CREAM | Freq: Every day | CUTANEOUS | 3 refills | Status: AC | PRN

## 2023-09-03 MED ORDER — DIAZEPAM 5 MG PO TABS: 5.0000 mg | ORAL_TABLET | Freq: Three times a day (TID) | ORAL | 0 refills | Status: DC | PRN

## 2023-09-03 MED ORDER — TRIAMCINOLONE 0.1 % CREAM:EUCERIN CREAM 1:1: 1.0000 | TOPICAL_CREAM | Freq: Every day | CUTANEOUS | 3 refills | Status: DC | PRN

## 2023-09-03 NOTE — Assessment & Plan Note (Signed)
 She has a long-standing history of kidney stone complaints. Per chart review, imaging has been performed multiple times without evidence of a stone but she states there was documented visualization in the past. She endorses right inguinal pain and dysuria. Denies fever, chills. -UA with reflex culture

## 2023-09-03 NOTE — Telephone Encounter (Signed)
 Call from pharmacy need new prescription sent for Ozempic .  Needs to be the 2mg /30ml pens in order for patient to do her 0.5 mg dosing.

## 2023-09-03 NOTE — Telephone Encounter (Addendum)
 I spoke with Brianna Roberts on the phone. Patient's identity was confirmed using two patient specific identifiers. We discussed her Ozempic prescription. Patient states she has not started taking this medication and looked into the side effects. She is not interested in beginning this medication at this time due to side effects. She states she is losing weight on her own currently.

## 2023-09-03 NOTE — Progress Notes (Signed)
 Internal Medicine Clinic Attending  Case discussed with the resident at the time of the visit.  We reviewed the resident's history and exam and pertinent patient test results.  I agree with the assessment, diagnosis, and plan of care documented in the resident's note.

## 2023-09-03 NOTE — Assessment & Plan Note (Signed)
 Tdap today

## 2023-09-03 NOTE — Telephone Encounter (Signed)
 Call to pahramacy spoke with Pharmacist.  New order to be sent in for Ozempic 2mg /3 ml. per pharmacy request.  Message was sent to Dr. Annie Paras.

## 2023-09-03 NOTE — Assessment & Plan Note (Addendum)
 A1c is 6.5. Diet controlled. She has failed Metformin in the past. Given morbid obesity will start Ozempic 0.25/weekly. -BMP

## 2023-09-04 ENCOUNTER — Telehealth: Payer: Self-pay | Admitting: Student

## 2023-09-04 NOTE — Telephone Encounter (Signed)
 Copied from CRM [id number]. Topic: General - Other >> Sep 03, 2023  8:41 AM Carrielelia G wrote: Denyse Dago Manager for [employer]  She is calling on behalf of Pt Mcnear.   Patient is needing a Bariatric Rollator.( [employer]: [phone].)   Patient is also requesting home health services: Physical Therapy, Occupational Therapy and a Home Health Aid.   Patient will also like a call back today to discuss her request.  Spoke with the patient who verbalized understanding that an appt will be needed to discuss the DME Supplies and a HH order.  Pt is sch for   Name: Sanika, Brosious MRN: [id number]  Date: 09/09/2023 Status: Sch  Time: 1:15 PM Length: 30  Visit Type: OPEN ESTABLISHED [726] Copay: $0.00  Provider: Lovie Macadamia, MD

## 2023-09-09 ENCOUNTER — Telehealth: Payer: Self-pay | Admitting: *Deleted

## 2023-09-09 ENCOUNTER — Encounter: Admitting: Student

## 2023-09-09 NOTE — Telephone Encounter (Signed)
 Will forward to doctor she was to see today.

## 2023-09-10 ENCOUNTER — Telehealth: Payer: Self-pay | Admitting: *Deleted

## 2023-09-10 NOTE — Telephone Encounter (Signed)
 Copied from CRM [id number]. Topic: Clinical - Medication Question >> Sep 09, 2023 10:00 AM Alcus Dad H wrote: Reason for CRM: Patient had to cancel appointment for today (having issues with urination), says ride costs too much and she has no way to make it. Wants to know if Dr. Annie Paras can call an antibiotic in to the [facility] on [address]. If not, wants to know if it is okay to take old prescription for Clindamycin that she has from last year for 150mg , says it hasn't expired.

## 2023-09-10 NOTE — Telephone Encounter (Signed)
 Will forward note to doctor who was to see her on yesterday.

## 2023-09-10 NOTE — Telephone Encounter (Signed)
 Previously discussed with patient.  They are not interested in starting this medication.  I will discontinue this medication.

## 2023-09-10 NOTE — Telephone Encounter (Signed)
  Communication  Reason for CRM:        Medication in question: Semaglutide,0.25 or 0.5MG /DOS, (OZEMPIC, 0.25 OR 0.5 MG/DOSE,) 2 MG/1.5ML SOPN        Pharmacy: [facility] [address], [place] - [address]    [address] [place], [place] [address]    Phone: [phone]  Fax: [phone]            Patient [facility] Pharmacist, Fonda Kinder, called in with questions regarding the patient's Ozempic medication.        1. The clinic sent in a script for 1.5 ml pen but the new pen is a 3.0 ml pen  Script must read 3.0 ml pen for script to be filled.        2. Their concern regarding the directions on the script were that the pen direction were "Inject 0.25 mg into the skin once a week." Typically the pharmacy noted the script would increase to 0.5 mg after four weeks. So they wanted to confirm whether or not the script would stay the same after the current script is finished.                                   Copied from CRM [id number]. Topic: Clinical - Medication Question >> Sep 09, 2023  5:27 PM Tiffany H wrote: Call from pharmacy need new prescription sent for Ozempic .  Needs to be the 2mg /29ml pens in order for patient to do her 0.5 mg dosing.    Per 09/03/23 office note from Dr. Annie Paras, advised [facility] that patient will not be fulfilling prescription at this time. [facility] would still like to clarify request. Please call Dre back.   Phone: [phone]

## 2023-09-11 DIAGNOSIS — F411 Generalized anxiety disorder: Secondary | ICD-10-CM | POA: Diagnosis not present

## 2023-09-11 DIAGNOSIS — F341 Dysthymic disorder: Secondary | ICD-10-CM | POA: Diagnosis not present

## 2023-09-16 DIAGNOSIS — F341 Dysthymic disorder: Secondary | ICD-10-CM | POA: Diagnosis not present

## 2023-09-16 DIAGNOSIS — F411 Generalized anxiety disorder: Secondary | ICD-10-CM | POA: Diagnosis not present

## 2023-09-23 DIAGNOSIS — F341 Dysthymic disorder: Secondary | ICD-10-CM | POA: Diagnosis not present

## 2023-09-23 DIAGNOSIS — F411 Generalized anxiety disorder: Secondary | ICD-10-CM | POA: Diagnosis not present

## 2023-09-26 DIAGNOSIS — F341 Dysthymic disorder: Secondary | ICD-10-CM | POA: Diagnosis not present

## 2023-09-26 DIAGNOSIS — F411 Generalized anxiety disorder: Secondary | ICD-10-CM | POA: Diagnosis not present

## 2023-09-27 ENCOUNTER — Ambulatory Visit: Payer: Self-pay

## 2023-09-27 ENCOUNTER — Encounter: Admitting: Internal Medicine

## 2023-09-27 NOTE — Telephone Encounter (Signed)
 I received a message from front office pt had cancelled her appt this am and see E2C2 note. I called pt who stated she's having "extreme vertigo". She took Meclizine around [employer]. Stated unable to come to her appt; unable to dress herself, walk down the stairs,catch an Benedetto Goad d/t the vertigo. Stated she will check her BP and BS first ( will call back if elevated/low). Also she stated she had her window open last night and the pollen could had caused the vertigo(stated she has allergies). Stated E2C2 told her to go to the ER/call an ambulance.

## 2023-09-27 NOTE — Telephone Encounter (Signed)
 Chief Complaint: dizziness Symptoms: dizziness, loss of appetite for a "couple weeks", "some nausea" Frequency: since 0300 AM today Pertinent Negatives: Patient denies CP, SOB, one-sided weakness, blurry vision, headache Disposition: [x] ED /[] Urgent Care (no appt availability in office) / [] Appointment(In office/virtual)/ []  [place] [facility]/ [] Home Care/ [x] Refused Recommended Disposition /[] [place] [facility]/ []  Follow-up with PCP Additional Notes: Pt reports vertigo and dizziness that began at 0300 this AM when she got up to use the bathroom. Pt went back to sleep and woke up at 0630 to her alarm and was even more dizzy. Pt states things look like they're moving, the room is spinning, and she is unsteady on her feet. Pt is afraid to walk, can't get dressed, and can't make it down the stairs for her 1015 appt today which she needs to cancel. Pt was seen for dizziness in the ED in February and prescribed meclizine. Pt took that this AM and states it has not helped. Pt denies CP, SOB, blurry vision, one-sided headache, vomiting. Pt states she has had some nausea and loss of appetite for a couple weeks. RN advised pt she needs to go to the ED for severe dizziness. RN advised pt RN would call 911 because the pt can't walk. Pt declined that and states her front door is locked so Ems would have to break the door to enter her home and she has taken 3 ambulance rides lately and has financial concerns. RN educated pt on why the ED is the safest place for the pt, pt verbalized understanding. Pt states she will call 911 if she feels she is getting worse. Pt states she has not taken her morning meds today. Pt concerned her BG may be low d/t her recent loss of appetite. RN advised pt to also check her BP and withhold her BP med if her BP is low. RN advised pt she needs to call 911 if she develops CP, SOB, one-sided weakness. Pt verbalized understanding. RN called the CAL to inform pt cannot make 1015 appt and  about the ED refusal.    Reason for Triage:  experiencing vertigo took medication meclizine (ANTIVERT) 25 MG tablet, patient has an appointment today but may not be able to make it  she said would like to speak with a nurse for advice.  Reason for Disposition  SEVERE dizziness (vertigo) (e.g., unable to walk without assistance)  Answer Assessment - Initial Assessment Questions 1. DESCRIPTION: "Describe your dizziness."     "Things moving, room spinning, can't get up to get ready, every time I move around it feels like there is water in my head" 2. VERTIGO: "Do you feel like either you or the room is spinning or tilting?"      Yes 3. LIGHTHEADED: "Do you feel lightheaded?" (e.g., somewhat faint, woozy, weak upon standing)     No 4. SEVERITY: "How bad is it?"  "Can you walk?"   - MILD: Feels slightly dizzy and unsteady, but is walking normally.   - MODERATE: Feels unsteady when walking, but not falling; interferes with normal activities (e.g., school, work).   - SEVERE: Unable to walk without falling, or requires assistance to walk without falling.     Severe  5. ONSET:  "When did the dizziness begin?"     First started at 0300 early this AM, "things felt like they were moving, the room was spinning", states she fell back asleep at 0430. This AM her alarm went off and it was worse, she had  to hold onto the edge of the bed around 0630 6. AGGRAVATING FACTORS: "Does anything make it worse?" (e.g., standing, change in head position)     Getting up, moving 7. CAUSE: "What do you think is causing the dizziness?"     Not sure - maybe allergy, maybe vertigo 8. RECURRENT SYMPTOM: "Have you had dizziness before?" If Yes, ask: "When was the last time?" "What happened that time?"      Yes - seen last month in the ED for dizziness, prescribed meclizine 9. OTHER SYMPTOMS: "Do you have any other symptoms?" (e.g., headache, weakness, numbness, vomiting, earache) Took meclizine this AM, not helping. Denies  headache. Denies blurry vision. Endorses a little nausea when she woke up at 0300 this AM. No vomiting. No pain in the ears. No numbness or weakness. No one-sided weakness. No CP or SOB. Hx of TIAS. Hx of HTN, Type 2 DM. Has not taken her AM meds.   "Couple weeks" or longer of loss of appetite. Last ate at 6-7pm yesterday. "Getting a little nauseous lately."  Protocols used: Dizziness - Vertigo-A-AH

## 2023-10-01 DIAGNOSIS — F411 Generalized anxiety disorder: Secondary | ICD-10-CM | POA: Diagnosis not present

## 2023-10-01 DIAGNOSIS — F341 Dysthymic disorder: Secondary | ICD-10-CM | POA: Diagnosis not present

## 2023-10-03 DIAGNOSIS — F341 Dysthymic disorder: Secondary | ICD-10-CM | POA: Diagnosis not present

## 2023-10-03 DIAGNOSIS — F411 Generalized anxiety disorder: Secondary | ICD-10-CM | POA: Diagnosis not present

## 2023-10-10 DIAGNOSIS — F411 Generalized anxiety disorder: Secondary | ICD-10-CM | POA: Diagnosis not present

## 2023-10-10 DIAGNOSIS — F341 Dysthymic disorder: Secondary | ICD-10-CM | POA: Diagnosis not present

## 2023-10-14 DIAGNOSIS — F341 Dysthymic disorder: Secondary | ICD-10-CM | POA: Diagnosis not present

## 2023-10-14 DIAGNOSIS — F411 Generalized anxiety disorder: Secondary | ICD-10-CM | POA: Diagnosis not present

## 2023-10-21 DIAGNOSIS — F411 Generalized anxiety disorder: Secondary | ICD-10-CM | POA: Diagnosis not present

## 2023-10-21 DIAGNOSIS — F341 Dysthymic disorder: Secondary | ICD-10-CM | POA: Diagnosis not present

## 2023-10-28 ENCOUNTER — Telehealth: Payer: Self-pay | Admitting: Student

## 2023-10-28 NOTE — Telephone Encounter (Signed)
 Spoke with the pt.  She stated she did not call the clinic it was a Case Worker from her [employer] calling for her.  Pt is aware of her appointment 10/29/2023 and will keep it.  Copied from CRM [id number]. Topic: Appointments - Scheduling Inquiry for Clinic >> Oct 28, 2023  9:27 AM Tisa Forester wrote: Reason for CRM: patient request if can see her provider Ronni Colace if possible  For follow up kidney stones- pt stated she thinks she is passing them. PT DECLINED TO SPEAK WITH THE NURSE she did not want to be triaged.  Patient already have an appointment on 10/29/23 with another provider however she preferred to see her provider Ronni Colace , please call patient to let know if can change to see her provider  Patient callback # [phone]

## 2023-10-29 ENCOUNTER — Encounter: Payer: Self-pay | Admitting: Internal Medicine

## 2023-10-29 ENCOUNTER — Ambulatory Visit (INDEPENDENT_AMBULATORY_CARE_PROVIDER_SITE_OTHER): Payer: Self-pay | Admitting: Internal Medicine

## 2023-10-29 ENCOUNTER — Ambulatory Visit (HOSPITAL_COMMUNITY)
Admission: RE | Admit: 2023-10-29 | Discharge: 2023-10-29 | Disposition: A | Discharge status: Home / Self Care | Source: Ambulatory Visit | Attending: Internal Medicine | Admitting: Internal Medicine

## 2023-10-29 VITALS — BP 105/51 | HR 72 | Temp 98.4°F | Ht 69.0 in | Wt 360.3 lb

## 2023-10-29 DIAGNOSIS — M25519 Pain in unspecified shoulder: Secondary | ICD-10-CM | POA: Insufficient documentation

## 2023-10-29 DIAGNOSIS — F411 Generalized anxiety disorder: Secondary | ICD-10-CM | POA: Diagnosis not present

## 2023-10-29 DIAGNOSIS — R5381 Other malaise: Secondary | ICD-10-CM

## 2023-10-29 DIAGNOSIS — E119 Type 2 diabetes mellitus without complications: Secondary | ICD-10-CM | POA: Diagnosis not present

## 2023-10-29 DIAGNOSIS — M19012 Primary osteoarthritis, left shoulder: Secondary | ICD-10-CM | POA: Diagnosis not present

## 2023-10-29 DIAGNOSIS — N200 Calculus of kidney: Secondary | ICD-10-CM | POA: Diagnosis not present

## 2023-10-29 DIAGNOSIS — G8929 Other chronic pain: Secondary | ICD-10-CM | POA: Diagnosis not present

## 2023-10-29 DIAGNOSIS — M25512 Pain in left shoulder: Secondary | ICD-10-CM

## 2023-10-29 DIAGNOSIS — F341 Dysthymic disorder: Secondary | ICD-10-CM | POA: Diagnosis not present

## 2023-10-29 DIAGNOSIS — Z0000 Encounter for general adult medical examination without abnormal findings: Secondary | ICD-10-CM

## 2023-10-29 MED ORDER — MELOXICAM 7.5 MG PO TABS: 15.0000 mg | ORAL_TABLET | Freq: Two times a day (BID) | ORAL | 0 refills | Status: DC

## 2023-10-29 MED ORDER — MELOXICAM 7.5 MG PO TABS: 7.5000 mg | ORAL_TABLET | Freq: Two times a day (BID) | ORAL | 0 refills | Status: DC

## 2023-10-29 NOTE — Assessment & Plan Note (Signed)
 Patient reports chronic left shoulder pain that has been present for over 5 months. She reports she woke up and her shoulder was hurting and it has been hurting since then. Over time, she has developed decreased range of motion. On exam pt with limited active and passive ROM due to pain. Her exam is consistent with adhesive capsulitis. Shoulder x ray was obtained that did not show any acute pathology but showed minor degenerative changes. Will treat with NSAIDs and physical therapy. Will refer to sports medicine. If it fails to improve with conservative measures, she may need an MRI of the left shoulder.

## 2023-10-29 NOTE — Assessment & Plan Note (Signed)
 Pt has DMII that is well controlled with lifestyle modifications. She was prescribed GLP 1 agonist but does not want to start that as she believes she her DM is well controlled and she has good results on her weight loss without any medications. Counseled on additional benefit a GLP 1 can provide but pt refused.

## 2023-10-29 NOTE — Assessment & Plan Note (Signed)
 Pt with multiple complaints today so her care gaps were not addressed this visit. Will address at follow up visit.

## 2023-10-29 NOTE — Assessment & Plan Note (Addendum)
 Pt continues to endorse similar complaints including right radiating groin pain with urination and dysuria. On chart review, this has been an ongoing issue over 9 years. Pt states she feels like she passed a stone over the last few days. She was referred to urology and had a partial work up done in 2023 but was lost to follow up due to cost. Will obtain urinalysis and if that shows concern for renal stone, then will proceed with CT renal study. Will place another referral to urology so pt can complete herr work up.

## 2023-10-29 NOTE — Progress Notes (Addendum)
 CC: one month follow up for DMII  HPI:  [name] is a 58 y.o. with medical history of HTN, DMII, Class III obesity, COPD, nephrolithiasis presenting to [facility] for one month follow up on DMII as pt was started on ozempic  last visit.   Please see problem-based list for further details, assessments, and plans.  Past Medical History:  Diagnosis Date   Allergic rhinitis 10/05/2016   Perennial.  "To everything."  Has seen allergists and had testing, which indicated that her only allergy is to mouse hair.     Allergy    Anxiety    Arthritis    Asthma    Asthma, chronic 10/05/2016   Reports annual bronchial infection requiring hospitalization.     Bilateral leg edema 10/05/2016   Work-up did not identify cause.        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 Sharp pain in bilateral frontal and temple area of the head. Pain is constant and waxes and wanes. 7/10 to 10/10. +photophobia. Intermittent left eye vision loss.    History:  MRI Of brain was unremarkable. Pt still suffers from cognitive issues. She states because of insurance change she has not been able to follow up with her re   Neuromuscular disorder (HCC)    Ovarian cyst 10/05/2016   RIGHT sided.  Causes pain.     Positive HTLV-1 antibody 04/22/2023   Thyroid  disease    TIA (transient ischemic attack)     Current Outpatient Medications (Endocrine &  Metabolic):    levothyroxine  (SYNTHROID ) 50 MCG tablet, Take 1 tablet (50 mcg total) by mouth daily before breakfast.  Current Outpatient Medications (Cardiovascular):    amLODipine  (NORVASC ) 10 MG tablet, Take 1 tablet (10 mg total) by mouth daily.   furosemide  (LASIX ) 40 MG tablet, Take 1 tablet (40 mg total) by mouth 2 (two) times daily.   metoprolol  tartrate (LOPRESSOR ) 100 MG tablet, Take 1 tablet (100 mg total) by mouth 2 (two) times daily.  Current Outpatient Medications (Respiratory):    albuterol  (VENTOLIN  HFA) 108 (90 Base) MCG/ACT inhaler, Inhale 1-2 puffs into the lungs every 6 (six) hours as needed for wheezing or shortness of breath.   fluticasone  (FLONASE ) 50 MCG/ACT nasal spray, Place 1 spray into both nostrils daily.   Fluticasone -Umeclidin-Vilant (TRELEGY ELLIPTA ) 200-62.5-25 MCG/ACT AEPB, Inhale 1 Inhalation into the lungs daily at 6 (six) AM.  Current Outpatient Medications (Analgesics):    meloxicam  (MOBIC ) 7.5 MG tablet, Take 1 tablet (7.5 mg total) by mouth in the morning and at bedtime.   Current Outpatient Medications (Other):    amitriptyline  (ELAVIL ) 100 MG tablet, Take 1 tablet (100 mg total) by mouth at bedtime.   Blood Glucose Monitoring Suppl (ONETOUCH VERIO) w/Device KIT, 1 each by Does not apply route as needed.   diazepam  (VALIUM ) 5 MG tablet, Take 1 tablet (  5 mg total) by mouth every 8 (eight) hours as needed for anxiety.   glucose blood test strip, Use as instructed to check blood sugar three times daily   Lancets Thin MISC, Use to check home glucose three times daily   meclizine  (ANTIVERT ) 25 MG tablet, Take 1 tablet (25 mg total) by mouth 3 (three) times daily as needed for dizziness.   Nerve Stimulator (PRO COMFORT TENS UNIT) DEVI, 1 Device by Does not apply route daily as needed.   nystatin  cream (MYCOSTATIN ), Apply 1 Application topically 2 (two) times daily.   ondansetron  (ZOFRAN -ODT) 8 MG disintegrating tablet, Take 1 tablet (8 mg total) by mouth  every 8 (eight) hours as needed for nausea or vomiting.   potassium chloride  (KLOR-CON  M) 10 MEQ tablet, Take 1 tablet (10 mEq total) by mouth 2 (two) times daily.   Triamcinolone  Acetonide (TRIAMCINOLONE  0.1 % CREAM : EUCERIN) CREA, Apply 1 Application topically daily as needed.   valACYclovir  (VALTREX ) 1000 MG tablet, Take 1 tablet (1,000 mg total) by mouth daily.  Review of Systems:  Review of system negative unless stated in the problem list or HPI.    Physical Exam:  Vitals:   10/29/23 1055  BP: (!) 105/51  Pulse: 72  Temp: 98.4 F (36.9 C)  TempSrc: Oral  SpO2: 98%  Weight: (!) 360 lb 4.8 oz (163.4 kg)  Height: 5\' 9"  (1.753 m)   Physical Exam General: NAD HENT: NCAT Lungs: CTAB, no wheeze, rhonchi or rales.  Cardiovascular: Normal heart sounds, no r/m/g, 2+ pulses in all extremities. No LE edema Abdomen: No TTP, normal bowel sounds MSK: No asymmetry or muscle atrophy. TTP of shoulder joint in anterior and superior aspects. Decreased active and passive ROM. Unable to perform any special shoulder test as they are limited by pain.  Skin: no lesions noted on exposed skin Neuro: Alert and oriented x4. CN grossly intact Psych: Normal mood and normal affect   Assessment & Plan:   Chronic left shoulder pain Patient reports chronic left shoulder pain that has been present for over 5 months. She reports she woke up and her shoulder was hurting and it has been hurting since then. Over time, she has developed decreased range of motion. On exam pt with limited active and passive ROM due to pain. Her exam is consistent with adhesive capsulitis. Shoulder x ray was obtained that did not show any acute pathology but showed minor degenerative changes. Will treat with NSAIDs and physical therapy. Will refer to sports medicine. If it fails to improve with conservative measures, she may need an MRI of the left shoulder.   Type 2 diabetes mellitus without complication, without long-term current  use of insulin (HCC) Pt has DMII that is well controlled with lifestyle modifications. She was prescribed GLP 1 agonist but does not want to start that as she believes she her DM is well controlled and she has good results on her weight loss without any medications. Counseled on additional benefit a GLP 1 can provide but pt refused.   Nephrolithiasis Pt continues to endorse similar complaints including right radiating groin pain with urination and dysuria. On chart review, this has been an ongoing issue over 9 years. Pt states she feels like she passed a stone over the last few days. She was referred to urology and had a partial work up done in 2023 but was lost to follow up due to cost. Will obtain urinalysis and if that shows concern for renal stone, then will  proceed with CT renal study. Will place another referral to urology so pt can complete herr work up.   Healthcare maintenance Pt with multiple complaints today so her care gaps were not addressed this visit. Will address at follow up visit.   Physical deconditioning Pt with physical deconditioning that is multifactorial including shoulder pain that has limited her movement. She will benefit from PT/OT and bariatric rollator (given her weight) to improve her mobility.    See Encounters Tab for problem based charting.  Patient Discussed with Dr. Versa Gore, MD Tommas Fragmin. [facility] Internal Medicine Residency, PGY-3

## 2023-10-29 NOTE — Patient Instructions (Addendum)
 [name], it was a pleasure seeing you today! You endorsed feeling well today. Below are some of the things we talked about this visit. We look forward to seeing you in the follow up appointment!  Today we discussed: For your shoulder pain, I will give you some pain medicine and get an x ray. I will also get physical therapy to see you as that will help greatly. I am also sending you to sports medicine.   For your kidney stones, I am getting a urine studies and sending you to a urologist.   I have ordered the following labs today:  Lab Orders  No laboratory test(s) ordered today      Referrals ordered today:   Referral Orders         Ambulatory referral to Sports Medicine         Ambulatory referral to Urology       I have ordered the following medication/changed the following medications:   Stop the following medications: Medications Discontinued During This Encounter  Medication Reason   meloxicam  (MOBIC ) 7.5 MG tablet Reorder     Start the following medications: Meds ordered this encounter  Medications   DISCONTD: meloxicam  (MOBIC ) 7.5 MG tablet    Sig: Take 2 tablets (15 mg total) by mouth in the morning and at bedtime.    Dispense:  120 tablet    Refill:  0   meloxicam  (MOBIC ) 7.5 MG tablet    Sig: Take 1 tablet (7.5 mg total) by mouth in the morning and at bedtime.    Dispense:  60 tablet    Refill:  0     Follow-up: 3-4 week follow up for kidney stones and shoulder pain  Please make sure to arrive 15 minutes prior to your next appointment. If you arrive late, you may be asked to reschedule.   We look forward to seeing you next time. Please call our clinic at [phone] if you have any questions or concerns. The best time to call is Monday-Friday from 9am-4pm, but there is someone available 24/7. If after hours or the weekend, call the main hospital number and ask for the Internal Medicine Resident On-Call. If you need medication refills, please notify  your pharmacy one week in advance and they will send us  a request.  Thank you for letting us  take part in your care. Wishing you the best!  Thank you, Jackolyn Masker, MD

## 2023-10-30 LAB — MICROSCOPIC EXAMINATION: Casts: NONE SEEN /LPF

## 2023-10-30 LAB — URINALYSIS, COMPLETE
Bilirubin, UA: NEGATIVE
Glucose, UA: NEGATIVE
Ketones, UA: NEGATIVE
Leukocytes,UA: NEGATIVE
Nitrite, UA: NEGATIVE
Protein,UA: NEGATIVE
RBC, UA: NEGATIVE
Specific Gravity, UA: 1.016 (ref 1.005–1.030)
Urobilinogen, Ur: 0.2 mg/dL (ref 0.2–1.0)
pH, UA: 5.5 (ref 5.0–7.5)

## 2023-10-30 NOTE — Progress Notes (Signed)
 Internal Medicine Clinic Attending  Case discussed with the resident at the time of the visit.  We reviewed the resident's history and exam and pertinent patient test results.  I agree with the assessment, diagnosis, and plan of care documented in the resident's note.

## 2023-10-31 ENCOUNTER — Telehealth: Payer: Self-pay

## 2023-10-31 LAB — URINE CULTURE: Organism ID, Bacteria: NO GROWTH

## 2023-10-31 NOTE — Telephone Encounter (Signed)
 Return call to [facility] Case manager w/[employer] - stated she had talked to pt who has concerns (as stated in the 3/12 encounter- front office did scheduled pt an appt on 3/17 to discuss  but pt canceled the appt.). Stated pt is requesting HHPT/OT, Aide, and bariatric rolator.  Stated pt did not mention this at the  OV with Dr Meredeth Stallion on 5/6. CM was made aware Dr Meredeth Stallion has ordered Sports Med Referral and Urology referral.

## 2023-10-31 NOTE — Telephone Encounter (Signed)
 Copied from CRM [id number]. Topic: General - Other >> Oct 31, 2023 11:06 AM Brynn Caras wrote: Reason for CRM: Hettie Lota a RN case manager with the [employer] would like to speak to a clinical staffmember in regards to continuity of care for the patient, and to address concerns the patient requested their assistance with such as her home health aid orders, DME supplies, and transportation support. Callback [phone]

## 2023-10-31 NOTE — Telephone Encounter (Signed)
 Please return Brianna Roberts's call @ [phone]. Brianna Roberts's needs a call back today asap. She will be out of the office tomorrow. Please address.

## 2023-11-01 NOTE — Addendum Note (Signed)
 Addended by: Jackolyn Masker on: 11/01/2023 08:32 AM   Modules accepted: Orders

## 2023-11-04 DIAGNOSIS — F411 Generalized anxiety disorder: Secondary | ICD-10-CM | POA: Diagnosis not present

## 2023-11-04 DIAGNOSIS — F341 Dysthymic disorder: Secondary | ICD-10-CM | POA: Diagnosis not present

## 2023-11-05 NOTE — Telephone Encounter (Signed)
 Call from [facility] with Raina Bunting to f/u. DME order (on 5/9) was placed but  Bariatric rolator was not noted in comments. Also pt is asking for [facility] Aide. Needs referral to Home Health for PT/OT/Aide (0n 5/6 HH was not put in as referral, please change).

## 2023-11-06 DIAGNOSIS — M549 Dorsalgia, unspecified: Secondary | ICD-10-CM | POA: Diagnosis not present

## 2023-11-06 DIAGNOSIS — R0602 Shortness of breath: Secondary | ICD-10-CM | POA: Diagnosis not present

## 2023-11-06 DIAGNOSIS — M25552 Pain in left hip: Secondary | ICD-10-CM | POA: Diagnosis not present

## 2023-11-06 DIAGNOSIS — M25551 Pain in right hip: Secondary | ICD-10-CM | POA: Diagnosis not present

## 2023-11-06 DIAGNOSIS — E7800 Pure hypercholesterolemia, unspecified: Secondary | ICD-10-CM | POA: Diagnosis not present

## 2023-11-06 DIAGNOSIS — E559 Vitamin D deficiency, unspecified: Secondary | ICD-10-CM | POA: Diagnosis not present

## 2023-11-06 DIAGNOSIS — Z1159 Encounter for screening for other viral diseases: Secondary | ICD-10-CM | POA: Diagnosis not present

## 2023-11-06 DIAGNOSIS — D539 Nutritional anemia, unspecified: Secondary | ICD-10-CM | POA: Diagnosis not present

## 2023-11-06 DIAGNOSIS — M25561 Pain in right knee: Secondary | ICD-10-CM | POA: Diagnosis not present

## 2023-11-06 DIAGNOSIS — E119 Type 2 diabetes mellitus without complications: Secondary | ICD-10-CM | POA: Diagnosis not present

## 2023-11-06 DIAGNOSIS — M25562 Pain in left knee: Secondary | ICD-10-CM | POA: Diagnosis not present

## 2023-11-06 DIAGNOSIS — Z79899 Other long term (current) drug therapy: Secondary | ICD-10-CM | POA: Diagnosis not present

## 2023-11-06 DIAGNOSIS — R5383 Other fatigue: Secondary | ICD-10-CM | POA: Diagnosis not present

## 2023-11-06 DIAGNOSIS — M129 Arthropathy, unspecified: Secondary | ICD-10-CM | POA: Diagnosis not present

## 2023-11-06 NOTE — Addendum Note (Signed)
 Addended by: Jackolyn Masker on: 11/06/2023 02:38 PM   Modules accepted: Orders

## 2023-11-08 DIAGNOSIS — Z79899 Other long term (current) drug therapy: Secondary | ICD-10-CM | POA: Diagnosis not present

## 2023-11-12 DIAGNOSIS — F411 Generalized anxiety disorder: Secondary | ICD-10-CM | POA: Diagnosis not present

## 2023-11-12 DIAGNOSIS — F341 Dysthymic disorder: Secondary | ICD-10-CM | POA: Diagnosis not present

## 2023-11-14 DIAGNOSIS — R5381 Other malaise: Secondary | ICD-10-CM | POA: Insufficient documentation

## 2023-11-14 NOTE — Assessment & Plan Note (Signed)
 Pt with physical deconditioning that is multifactorial including shoulder pain that has limited her movement. She will benefit from PT/OT and bariatric rollator (given her weight) to improve her mobility.

## 2023-11-14 NOTE — Addendum Note (Signed)
 Addended by: Jackolyn Masker on: 11/14/2023 11:56 AM   Modules accepted: Orders

## 2023-11-20 DIAGNOSIS — F341 Dysthymic disorder: Secondary | ICD-10-CM | POA: Diagnosis not present

## 2023-11-20 DIAGNOSIS — F411 Generalized anxiety disorder: Secondary | ICD-10-CM | POA: Diagnosis not present

## 2023-11-20 NOTE — Telephone Encounter (Signed)
 A message has been sent to the provider to update his LOV to reflect the necessity of the DME Requested Order  Copied from CRM [id number]. Topic: Clinical - Order For Equipment >> Nov 20, 2023  9:33 AM Brynn Caras wrote: Reason for CRM: [place] with [employer] states the previous referral order placed on behalf of this patient must be updated to specifically state "Bariatric rollator with seat." She states a statement is required from the patient's provider for insurance to cover this order. The statement must include a regular walker is not supportive for the patient due to her mobility issues. Callback [phone]

## 2023-11-21 DIAGNOSIS — Z5982 Transportation insecurity: Secondary | ICD-10-CM | POA: Diagnosis not present

## 2023-11-21 DIAGNOSIS — J309 Allergic rhinitis, unspecified: Secondary | ICD-10-CM | POA: Diagnosis not present

## 2023-11-21 DIAGNOSIS — M25512 Pain in left shoulder: Secondary | ICD-10-CM | POA: Diagnosis not present

## 2023-11-21 DIAGNOSIS — Z79899 Other long term (current) drug therapy: Secondary | ICD-10-CM | POA: Diagnosis not present

## 2023-11-21 DIAGNOSIS — G709 Myoneural disorder, unspecified: Secondary | ICD-10-CM | POA: Diagnosis not present

## 2023-11-21 DIAGNOSIS — M1990 Unspecified osteoarthritis, unspecified site: Secondary | ICD-10-CM | POA: Diagnosis not present

## 2023-11-21 DIAGNOSIS — G43909 Migraine, unspecified, not intractable, without status migrainosus: Secondary | ICD-10-CM | POA: Diagnosis not present

## 2023-11-21 DIAGNOSIS — E039 Hypothyroidism, unspecified: Secondary | ICD-10-CM | POA: Diagnosis not present

## 2023-11-21 DIAGNOSIS — M5030 Other cervical disc degeneration, unspecified cervical region: Secondary | ICD-10-CM | POA: Diagnosis not present

## 2023-11-21 DIAGNOSIS — Z8673 Personal history of transient ischemic attack (TIA), and cerebral infarction without residual deficits: Secondary | ICD-10-CM | POA: Diagnosis not present

## 2023-11-21 DIAGNOSIS — Z791 Long term (current) use of non-steroidal anti-inflammatories (NSAID): Secondary | ICD-10-CM | POA: Diagnosis not present

## 2023-11-21 DIAGNOSIS — G8929 Other chronic pain: Secondary | ICD-10-CM | POA: Diagnosis not present

## 2023-11-21 DIAGNOSIS — F341 Dysthymic disorder: Secondary | ICD-10-CM | POA: Diagnosis not present

## 2023-11-21 DIAGNOSIS — Z7951 Long term (current) use of inhaled steroids: Secondary | ICD-10-CM | POA: Diagnosis not present

## 2023-11-21 DIAGNOSIS — F419 Anxiety disorder, unspecified: Secondary | ICD-10-CM | POA: Diagnosis not present

## 2023-11-21 DIAGNOSIS — N200 Calculus of kidney: Secondary | ICD-10-CM | POA: Diagnosis not present

## 2023-11-21 DIAGNOSIS — M797 Fibromyalgia: Secondary | ICD-10-CM | POA: Diagnosis not present

## 2023-11-21 DIAGNOSIS — E66813 Obesity, class 3: Secondary | ICD-10-CM | POA: Diagnosis not present

## 2023-11-21 DIAGNOSIS — E119 Type 2 diabetes mellitus without complications: Secondary | ICD-10-CM | POA: Diagnosis not present

## 2023-11-21 DIAGNOSIS — Z87440 Personal history of urinary (tract) infections: Secondary | ICD-10-CM | POA: Diagnosis not present

## 2023-11-21 DIAGNOSIS — I10 Essential (primary) hypertension: Secondary | ICD-10-CM | POA: Diagnosis not present

## 2023-11-21 DIAGNOSIS — F411 Generalized anxiety disorder: Secondary | ICD-10-CM | POA: Diagnosis not present

## 2023-11-21 DIAGNOSIS — J4489 Other specified chronic obstructive pulmonary disease: Secondary | ICD-10-CM | POA: Diagnosis not present

## 2023-11-21 DIAGNOSIS — R471 Dysarthria and anarthria: Secondary | ICD-10-CM | POA: Diagnosis not present

## 2023-11-21 DIAGNOSIS — F32A Depression, unspecified: Secondary | ICD-10-CM | POA: Diagnosis not present

## 2023-11-21 DIAGNOSIS — G4733 Obstructive sleep apnea (adult) (pediatric): Secondary | ICD-10-CM | POA: Diagnosis not present

## 2023-11-21 DIAGNOSIS — G9332 Myalgic encephalomyelitis/chronic fatigue syndrome: Secondary | ICD-10-CM | POA: Diagnosis not present

## 2023-11-22 DIAGNOSIS — R5381 Other malaise: Secondary | ICD-10-CM | POA: Diagnosis not present

## 2023-11-25 ENCOUNTER — Other Ambulatory Visit: Payer: Self-pay | Admitting: Internal Medicine

## 2023-11-25 DIAGNOSIS — Z791 Long term (current) use of non-steroidal anti-inflammatories (NSAID): Secondary | ICD-10-CM | POA: Diagnosis not present

## 2023-11-25 DIAGNOSIS — G709 Myoneural disorder, unspecified: Secondary | ICD-10-CM | POA: Diagnosis not present

## 2023-11-25 DIAGNOSIS — E039 Hypothyroidism, unspecified: Secondary | ICD-10-CM | POA: Diagnosis not present

## 2023-11-25 DIAGNOSIS — M25512 Pain in left shoulder: Secondary | ICD-10-CM | POA: Diagnosis not present

## 2023-11-25 DIAGNOSIS — I10 Essential (primary) hypertension: Secondary | ICD-10-CM | POA: Diagnosis not present

## 2023-11-25 DIAGNOSIS — G4733 Obstructive sleep apnea (adult) (pediatric): Secondary | ICD-10-CM | POA: Diagnosis not present

## 2023-11-25 DIAGNOSIS — E119 Type 2 diabetes mellitus without complications: Secondary | ICD-10-CM | POA: Diagnosis not present

## 2023-11-25 DIAGNOSIS — R471 Dysarthria and anarthria: Secondary | ICD-10-CM | POA: Diagnosis not present

## 2023-11-25 DIAGNOSIS — M5030 Other cervical disc degeneration, unspecified cervical region: Secondary | ICD-10-CM | POA: Diagnosis not present

## 2023-11-25 DIAGNOSIS — E66813 Obesity, class 3: Secondary | ICD-10-CM | POA: Diagnosis not present

## 2023-11-25 DIAGNOSIS — G9332 Myalgic encephalomyelitis/chronic fatigue syndrome: Secondary | ICD-10-CM | POA: Diagnosis not present

## 2023-11-25 DIAGNOSIS — G43909 Migraine, unspecified, not intractable, without status migrainosus: Secondary | ICD-10-CM | POA: Diagnosis not present

## 2023-11-25 DIAGNOSIS — F419 Anxiety disorder, unspecified: Secondary | ICD-10-CM | POA: Diagnosis not present

## 2023-11-25 DIAGNOSIS — Z8673 Personal history of transient ischemic attack (TIA), and cerebral infarction without residual deficits: Secondary | ICD-10-CM | POA: Diagnosis not present

## 2023-11-25 DIAGNOSIS — J4489 Other specified chronic obstructive pulmonary disease: Secondary | ICD-10-CM | POA: Diagnosis not present

## 2023-11-25 DIAGNOSIS — Z87440 Personal history of urinary (tract) infections: Secondary | ICD-10-CM | POA: Diagnosis not present

## 2023-11-25 DIAGNOSIS — N200 Calculus of kidney: Secondary | ICD-10-CM | POA: Diagnosis not present

## 2023-11-25 DIAGNOSIS — Z5982 Transportation insecurity: Secondary | ICD-10-CM | POA: Diagnosis not present

## 2023-11-25 DIAGNOSIS — Z79899 Other long term (current) drug therapy: Secondary | ICD-10-CM | POA: Diagnosis not present

## 2023-11-25 DIAGNOSIS — Z7951 Long term (current) use of inhaled steroids: Secondary | ICD-10-CM | POA: Diagnosis not present

## 2023-11-25 DIAGNOSIS — F32A Depression, unspecified: Secondary | ICD-10-CM | POA: Diagnosis not present

## 2023-11-25 DIAGNOSIS — G8929 Other chronic pain: Secondary | ICD-10-CM | POA: Diagnosis not present

## 2023-11-25 DIAGNOSIS — M797 Fibromyalgia: Secondary | ICD-10-CM | POA: Diagnosis not present

## 2023-11-25 DIAGNOSIS — M1990 Unspecified osteoarthritis, unspecified site: Secondary | ICD-10-CM | POA: Diagnosis not present

## 2023-11-25 DIAGNOSIS — J309 Allergic rhinitis, unspecified: Secondary | ICD-10-CM | POA: Diagnosis not present

## 2023-11-25 NOTE — Telephone Encounter (Signed)
 Medication sent to pharmacy

## 2023-11-26 ENCOUNTER — Ambulatory Visit: Admitting: Family Medicine

## 2023-11-27 DIAGNOSIS — F411 Generalized anxiety disorder: Secondary | ICD-10-CM | POA: Diagnosis not present

## 2023-11-27 DIAGNOSIS — F341 Dysthymic disorder: Secondary | ICD-10-CM | POA: Diagnosis not present

## 2023-11-29 DIAGNOSIS — E1122 Type 2 diabetes mellitus with diabetic chronic kidney disease: Secondary | ICD-10-CM | POA: Diagnosis not present

## 2023-11-29 DIAGNOSIS — I130 Hypertensive heart and chronic kidney disease with heart failure and stage 1 through stage 4 chronic kidney disease, or unspecified chronic kidney disease: Secondary | ICD-10-CM | POA: Diagnosis not present

## 2023-11-29 DIAGNOSIS — M5030 Other cervical disc degeneration, unspecified cervical region: Secondary | ICD-10-CM | POA: Diagnosis not present

## 2023-11-29 DIAGNOSIS — I10 Essential (primary) hypertension: Secondary | ICD-10-CM | POA: Diagnosis not present

## 2023-11-29 DIAGNOSIS — G8929 Other chronic pain: Secondary | ICD-10-CM | POA: Diagnosis not present

## 2023-11-29 DIAGNOSIS — Z8673 Personal history of transient ischemic attack (TIA), and cerebral infarction without residual deficits: Secondary | ICD-10-CM | POA: Diagnosis not present

## 2023-11-29 DIAGNOSIS — G4733 Obstructive sleep apnea (adult) (pediatric): Secondary | ICD-10-CM | POA: Diagnosis not present

## 2023-11-29 DIAGNOSIS — R471 Dysarthria and anarthria: Secondary | ICD-10-CM | POA: Diagnosis not present

## 2023-11-29 DIAGNOSIS — E11620 Type 2 diabetes mellitus with diabetic dermatitis: Secondary | ICD-10-CM | POA: Diagnosis not present

## 2023-11-29 DIAGNOSIS — Z7951 Long term (current) use of inhaled steroids: Secondary | ICD-10-CM | POA: Diagnosis not present

## 2023-11-29 DIAGNOSIS — Z8249 Family history of ischemic heart disease and other diseases of the circulatory system: Secondary | ICD-10-CM | POA: Diagnosis not present

## 2023-11-29 DIAGNOSIS — F324 Major depressive disorder, single episode, in partial remission: Secondary | ICD-10-CM | POA: Diagnosis not present

## 2023-11-29 DIAGNOSIS — E1151 Type 2 diabetes mellitus with diabetic peripheral angiopathy without gangrene: Secondary | ICD-10-CM | POA: Diagnosis not present

## 2023-11-29 DIAGNOSIS — E119 Type 2 diabetes mellitus without complications: Secondary | ICD-10-CM | POA: Diagnosis not present

## 2023-11-29 DIAGNOSIS — E785 Hyperlipidemia, unspecified: Secondary | ICD-10-CM | POA: Diagnosis not present

## 2023-11-29 DIAGNOSIS — F32A Depression, unspecified: Secondary | ICD-10-CM | POA: Diagnosis not present

## 2023-11-29 DIAGNOSIS — I501 Left ventricular failure: Secondary | ICD-10-CM | POA: Diagnosis not present

## 2023-11-29 DIAGNOSIS — N200 Calculus of kidney: Secondary | ICD-10-CM | POA: Diagnosis not present

## 2023-11-29 DIAGNOSIS — Z791 Long term (current) use of non-steroidal anti-inflammatories (NSAID): Secondary | ICD-10-CM | POA: Diagnosis not present

## 2023-11-29 DIAGNOSIS — M797 Fibromyalgia: Secondary | ICD-10-CM | POA: Diagnosis not present

## 2023-11-29 DIAGNOSIS — M1990 Unspecified osteoarthritis, unspecified site: Secondary | ICD-10-CM | POA: Diagnosis not present

## 2023-11-29 DIAGNOSIS — J4489 Other specified chronic obstructive pulmonary disease: Secondary | ICD-10-CM | POA: Diagnosis not present

## 2023-11-29 DIAGNOSIS — Z87440 Personal history of urinary (tract) infections: Secondary | ICD-10-CM | POA: Diagnosis not present

## 2023-11-29 DIAGNOSIS — G43909 Migraine, unspecified, not intractable, without status migrainosus: Secondary | ICD-10-CM | POA: Diagnosis not present

## 2023-11-29 DIAGNOSIS — G9332 Myalgic encephalomyelitis/chronic fatigue syndrome: Secondary | ICD-10-CM | POA: Diagnosis not present

## 2023-11-29 DIAGNOSIS — Z79899 Other long term (current) drug therapy: Secondary | ICD-10-CM | POA: Diagnosis not present

## 2023-11-29 DIAGNOSIS — G709 Myoneural disorder, unspecified: Secondary | ICD-10-CM | POA: Diagnosis not present

## 2023-11-29 DIAGNOSIS — F419 Anxiety disorder, unspecified: Secondary | ICD-10-CM | POA: Diagnosis not present

## 2023-11-29 DIAGNOSIS — Z5982 Transportation insecurity: Secondary | ICD-10-CM | POA: Diagnosis not present

## 2023-11-29 DIAGNOSIS — E66813 Obesity, class 3: Secondary | ICD-10-CM | POA: Diagnosis not present

## 2023-11-29 DIAGNOSIS — J309 Allergic rhinitis, unspecified: Secondary | ICD-10-CM | POA: Diagnosis not present

## 2023-11-29 DIAGNOSIS — M25512 Pain in left shoulder: Secondary | ICD-10-CM | POA: Diagnosis not present

## 2023-11-29 DIAGNOSIS — E039 Hypothyroidism, unspecified: Secondary | ICD-10-CM | POA: Diagnosis not present

## 2023-12-03 DIAGNOSIS — F411 Generalized anxiety disorder: Secondary | ICD-10-CM | POA: Diagnosis not present

## 2023-12-03 DIAGNOSIS — F341 Dysthymic disorder: Secondary | ICD-10-CM | POA: Diagnosis not present

## 2023-12-04 DIAGNOSIS — I10 Essential (primary) hypertension: Secondary | ICD-10-CM | POA: Diagnosis not present

## 2023-12-04 DIAGNOSIS — G8929 Other chronic pain: Secondary | ICD-10-CM | POA: Diagnosis not present

## 2023-12-04 DIAGNOSIS — Z8673 Personal history of transient ischemic attack (TIA), and cerebral infarction without residual deficits: Secondary | ICD-10-CM | POA: Diagnosis not present

## 2023-12-04 DIAGNOSIS — G43909 Migraine, unspecified, not intractable, without status migrainosus: Secondary | ICD-10-CM | POA: Diagnosis not present

## 2023-12-04 DIAGNOSIS — Z79899 Other long term (current) drug therapy: Secondary | ICD-10-CM | POA: Diagnosis not present

## 2023-12-04 DIAGNOSIS — R471 Dysarthria and anarthria: Secondary | ICD-10-CM | POA: Diagnosis not present

## 2023-12-04 DIAGNOSIS — M797 Fibromyalgia: Secondary | ICD-10-CM | POA: Diagnosis not present

## 2023-12-04 DIAGNOSIS — Z791 Long term (current) use of non-steroidal anti-inflammatories (NSAID): Secondary | ICD-10-CM | POA: Diagnosis not present

## 2023-12-04 DIAGNOSIS — E039 Hypothyroidism, unspecified: Secondary | ICD-10-CM | POA: Diagnosis not present

## 2023-12-04 DIAGNOSIS — G709 Myoneural disorder, unspecified: Secondary | ICD-10-CM | POA: Diagnosis not present

## 2023-12-04 DIAGNOSIS — Z5982 Transportation insecurity: Secondary | ICD-10-CM | POA: Diagnosis not present

## 2023-12-04 DIAGNOSIS — F32A Depression, unspecified: Secondary | ICD-10-CM | POA: Diagnosis not present

## 2023-12-04 DIAGNOSIS — F419 Anxiety disorder, unspecified: Secondary | ICD-10-CM | POA: Diagnosis not present

## 2023-12-04 DIAGNOSIS — G4733 Obstructive sleep apnea (adult) (pediatric): Secondary | ICD-10-CM | POA: Diagnosis not present

## 2023-12-04 DIAGNOSIS — M5030 Other cervical disc degeneration, unspecified cervical region: Secondary | ICD-10-CM | POA: Diagnosis not present

## 2023-12-04 DIAGNOSIS — G9332 Myalgic encephalomyelitis/chronic fatigue syndrome: Secondary | ICD-10-CM | POA: Diagnosis not present

## 2023-12-04 DIAGNOSIS — J4489 Other specified chronic obstructive pulmonary disease: Secondary | ICD-10-CM | POA: Diagnosis not present

## 2023-12-04 DIAGNOSIS — M1990 Unspecified osteoarthritis, unspecified site: Secondary | ICD-10-CM | POA: Diagnosis not present

## 2023-12-04 DIAGNOSIS — M25512 Pain in left shoulder: Secondary | ICD-10-CM | POA: Diagnosis not present

## 2023-12-04 DIAGNOSIS — E66813 Obesity, class 3: Secondary | ICD-10-CM | POA: Diagnosis not present

## 2023-12-04 DIAGNOSIS — N200 Calculus of kidney: Secondary | ICD-10-CM | POA: Diagnosis not present

## 2023-12-04 DIAGNOSIS — E119 Type 2 diabetes mellitus without complications: Secondary | ICD-10-CM | POA: Diagnosis not present

## 2023-12-04 DIAGNOSIS — Z87440 Personal history of urinary (tract) infections: Secondary | ICD-10-CM | POA: Diagnosis not present

## 2023-12-04 DIAGNOSIS — J309 Allergic rhinitis, unspecified: Secondary | ICD-10-CM | POA: Diagnosis not present

## 2023-12-04 DIAGNOSIS — Z7951 Long term (current) use of inhaled steroids: Secondary | ICD-10-CM | POA: Diagnosis not present

## 2023-12-06 DIAGNOSIS — Z8673 Personal history of transient ischemic attack (TIA), and cerebral infarction without residual deficits: Secondary | ICD-10-CM | POA: Diagnosis not present

## 2023-12-06 DIAGNOSIS — Z791 Long term (current) use of non-steroidal anti-inflammatories (NSAID): Secondary | ICD-10-CM | POA: Diagnosis not present

## 2023-12-06 DIAGNOSIS — G4733 Obstructive sleep apnea (adult) (pediatric): Secondary | ICD-10-CM | POA: Diagnosis not present

## 2023-12-06 DIAGNOSIS — I10 Essential (primary) hypertension: Secondary | ICD-10-CM | POA: Diagnosis not present

## 2023-12-06 DIAGNOSIS — N200 Calculus of kidney: Secondary | ICD-10-CM | POA: Diagnosis not present

## 2023-12-06 DIAGNOSIS — R471 Dysarthria and anarthria: Secondary | ICD-10-CM | POA: Diagnosis not present

## 2023-12-06 DIAGNOSIS — Z87440 Personal history of urinary (tract) infections: Secondary | ICD-10-CM | POA: Diagnosis not present

## 2023-12-06 DIAGNOSIS — F32A Depression, unspecified: Secondary | ICD-10-CM | POA: Diagnosis not present

## 2023-12-06 DIAGNOSIS — M797 Fibromyalgia: Secondary | ICD-10-CM | POA: Diagnosis not present

## 2023-12-06 DIAGNOSIS — Z79899 Other long term (current) drug therapy: Secondary | ICD-10-CM | POA: Diagnosis not present

## 2023-12-06 DIAGNOSIS — G8929 Other chronic pain: Secondary | ICD-10-CM | POA: Diagnosis not present

## 2023-12-06 DIAGNOSIS — E119 Type 2 diabetes mellitus without complications: Secondary | ICD-10-CM | POA: Diagnosis not present

## 2023-12-06 DIAGNOSIS — E66813 Obesity, class 3: Secondary | ICD-10-CM | POA: Diagnosis not present

## 2023-12-06 DIAGNOSIS — M5030 Other cervical disc degeneration, unspecified cervical region: Secondary | ICD-10-CM | POA: Diagnosis not present

## 2023-12-06 DIAGNOSIS — G709 Myoneural disorder, unspecified: Secondary | ICD-10-CM | POA: Diagnosis not present

## 2023-12-06 DIAGNOSIS — M1990 Unspecified osteoarthritis, unspecified site: Secondary | ICD-10-CM | POA: Diagnosis not present

## 2023-12-06 DIAGNOSIS — M25512 Pain in left shoulder: Secondary | ICD-10-CM | POA: Diagnosis not present

## 2023-12-06 DIAGNOSIS — J4489 Other specified chronic obstructive pulmonary disease: Secondary | ICD-10-CM | POA: Diagnosis not present

## 2023-12-06 DIAGNOSIS — E039 Hypothyroidism, unspecified: Secondary | ICD-10-CM | POA: Diagnosis not present

## 2023-12-06 DIAGNOSIS — F419 Anxiety disorder, unspecified: Secondary | ICD-10-CM | POA: Diagnosis not present

## 2023-12-06 DIAGNOSIS — Z7951 Long term (current) use of inhaled steroids: Secondary | ICD-10-CM | POA: Diagnosis not present

## 2023-12-06 DIAGNOSIS — Z5982 Transportation insecurity: Secondary | ICD-10-CM | POA: Diagnosis not present

## 2023-12-06 DIAGNOSIS — G9332 Myalgic encephalomyelitis/chronic fatigue syndrome: Secondary | ICD-10-CM | POA: Diagnosis not present

## 2023-12-06 DIAGNOSIS — J309 Allergic rhinitis, unspecified: Secondary | ICD-10-CM | POA: Diagnosis not present

## 2023-12-06 DIAGNOSIS — G43909 Migraine, unspecified, not intractable, without status migrainosus: Secondary | ICD-10-CM | POA: Diagnosis not present

## 2023-12-09 ENCOUNTER — Telehealth: Payer: Self-pay | Admitting: *Deleted

## 2023-12-09 NOTE — Telephone Encounter (Signed)
 Copied from CRM [id number]. Topic: Clinical - Home Health Verbal Orders >> Dec 06, 2023  3:09 PM Tiffany H wrote: Caller/Agency: [facility] Callback Number: [phone] Service Requested: Physical Therapy - Would like permission to start dry-needling due to enduring pain. Other things have been tried but patient's pain is persisting. Please provide verbal order for Jim Motts to begin.  Frequency: 1x5 Any new concerns about the patient? Yes

## 2023-12-10 ENCOUNTER — Telehealth: Payer: Self-pay | Admitting: *Deleted

## 2023-12-10 ENCOUNTER — Telehealth: Admitting: Physician Assistant

## 2023-12-10 DIAGNOSIS — G43909 Migraine, unspecified, not intractable, without status migrainosus: Secondary | ICD-10-CM | POA: Diagnosis not present

## 2023-12-10 DIAGNOSIS — E119 Type 2 diabetes mellitus without complications: Secondary | ICD-10-CM | POA: Diagnosis not present

## 2023-12-10 DIAGNOSIS — J4489 Other specified chronic obstructive pulmonary disease: Secondary | ICD-10-CM | POA: Diagnosis not present

## 2023-12-10 DIAGNOSIS — G9332 Myalgic encephalomyelitis/chronic fatigue syndrome: Secondary | ICD-10-CM | POA: Diagnosis not present

## 2023-12-10 DIAGNOSIS — R471 Dysarthria and anarthria: Secondary | ICD-10-CM | POA: Diagnosis not present

## 2023-12-10 DIAGNOSIS — F341 Dysthymic disorder: Secondary | ICD-10-CM | POA: Diagnosis not present

## 2023-12-10 DIAGNOSIS — Z7951 Long term (current) use of inhaled steroids: Secondary | ICD-10-CM | POA: Diagnosis not present

## 2023-12-10 DIAGNOSIS — M5030 Other cervical disc degeneration, unspecified cervical region: Secondary | ICD-10-CM | POA: Diagnosis not present

## 2023-12-10 DIAGNOSIS — E66813 Obesity, class 3: Secondary | ICD-10-CM | POA: Diagnosis not present

## 2023-12-10 DIAGNOSIS — L259 Unspecified contact dermatitis, unspecified cause: Secondary | ICD-10-CM | POA: Diagnosis not present

## 2023-12-10 DIAGNOSIS — F32A Depression, unspecified: Secondary | ICD-10-CM | POA: Diagnosis not present

## 2023-12-10 DIAGNOSIS — Z8673 Personal history of transient ischemic attack (TIA), and cerebral infarction without residual deficits: Secondary | ICD-10-CM | POA: Diagnosis not present

## 2023-12-10 DIAGNOSIS — G709 Myoneural disorder, unspecified: Secondary | ICD-10-CM | POA: Diagnosis not present

## 2023-12-10 DIAGNOSIS — F411 Generalized anxiety disorder: Secondary | ICD-10-CM | POA: Diagnosis not present

## 2023-12-10 DIAGNOSIS — M797 Fibromyalgia: Secondary | ICD-10-CM | POA: Diagnosis not present

## 2023-12-10 DIAGNOSIS — Z87440 Personal history of urinary (tract) infections: Secondary | ICD-10-CM | POA: Diagnosis not present

## 2023-12-10 DIAGNOSIS — Z79899 Other long term (current) drug therapy: Secondary | ICD-10-CM | POA: Diagnosis not present

## 2023-12-10 DIAGNOSIS — E039 Hypothyroidism, unspecified: Secondary | ICD-10-CM | POA: Diagnosis not present

## 2023-12-10 DIAGNOSIS — M25512 Pain in left shoulder: Secondary | ICD-10-CM | POA: Diagnosis not present

## 2023-12-10 DIAGNOSIS — F419 Anxiety disorder, unspecified: Secondary | ICD-10-CM | POA: Diagnosis not present

## 2023-12-10 DIAGNOSIS — G8929 Other chronic pain: Secondary | ICD-10-CM | POA: Diagnosis not present

## 2023-12-10 DIAGNOSIS — N200 Calculus of kidney: Secondary | ICD-10-CM | POA: Diagnosis not present

## 2023-12-10 DIAGNOSIS — G4733 Obstructive sleep apnea (adult) (pediatric): Secondary | ICD-10-CM | POA: Diagnosis not present

## 2023-12-10 DIAGNOSIS — M1990 Unspecified osteoarthritis, unspecified site: Secondary | ICD-10-CM | POA: Diagnosis not present

## 2023-12-10 DIAGNOSIS — Z791 Long term (current) use of non-steroidal anti-inflammatories (NSAID): Secondary | ICD-10-CM | POA: Diagnosis not present

## 2023-12-10 DIAGNOSIS — I10 Essential (primary) hypertension: Secondary | ICD-10-CM | POA: Diagnosis not present

## 2023-12-10 DIAGNOSIS — J309 Allergic rhinitis, unspecified: Secondary | ICD-10-CM | POA: Diagnosis not present

## 2023-12-10 DIAGNOSIS — Z5982 Transportation insecurity: Secondary | ICD-10-CM | POA: Diagnosis not present

## 2023-12-10 MED ORDER — TRIAMCINOLONE ACETONIDE 0.025 % EX OINT: 1.0000 | TOPICAL_OINTMENT | Freq: Two times a day (BID) | CUTANEOUS | 0 refills | Status: AC

## 2023-12-10 NOTE — Progress Notes (Signed)
 E Visit for Rash  We are sorry that you are not feeling well. Here is how we plan to help!  Based on what you shared with me it looks like you have contact dermatitis.  Contact dermatitis is a skin rash caused by something that touches the skin and causes irritation or inflammation.  Your skin may be red, swollen, dry, cracked, and itch.  The rash should go away in a few days but can last a few weeks.  If you get a rash, it's important to figure out what caused it so the irritant can be avoided in the future. and I am prescribing triamcinolone  0.025% cream -- apply to the affected area(s) in a thin layer, twice daily for up to 14 days.    HOME CARE:  Take cool showers and avoid direct sunlight. Apply cool compress or wet dressings. Take a bath in an oatmeal bath.  Sprinkle content of one Aveeno packet under running faucet with comfortably warm water.  Bathe for 15-20 minutes, 1-2 times daily.  Pat dry with a towel. Do not rub the rash. Use hydrocortisone  cream. Take an antihistamine like Benadryl  for widespread rashes that itch.  The adult dose of Benadryl  is 25-50 mg by mouth 4 times daily. Caution:  This type of medication may cause sleepiness.  Do not drink alcohol , drive, or operate dangerous machinery while taking antihistamines.  Do not take these medications if you have prostate enlargement.  Read package instructions thoroughly on all medications that you take.  GET HELP RIGHT AWAY IF:  Symptoms don't go away after treatment. Severe itching that persists. If you rash spreads or swells. If you rash begins to smell. If it blisters and opens or develops a yellow-brown crust. You develop a fever. You have a sore throat. You become short of breath.  MAKE SURE YOU:  Understand these instructions. Will watch your condition. Will get help right away if you are not doing well or get worse.  Thank you for choosing an e-visit.  Your e-visit answers were reviewed by a board certified  advanced clinical practitioner to complete your personal care plan. Depending upon the condition, your plan could have included both over the counter or prescription medications.  Please review your pharmacy choice. Make sure the pharmacy is open so you can pick up prescription now. If there is a problem, you may contact your provider through [employer] and have the prescription routed to another pharmacy.  Your safety is important to us . If you have drug allergies check your prescription carefully.   For the next 24 hours you can use MyChart to ask questions about today's visit, request a non-urgent call back, or ask for a work or school excuse. You will get an email in the next two days asking about your experience. I hope that your e-visit has been valuable and will speed your recovery.    I have spent 5 minutes in review of e-visit questionnaire, review and updating patient chart, medical decision making and response to patient.   Angelia Kelp, PA-C

## 2023-12-10 NOTE — Telephone Encounter (Signed)
 RTC to [facility] requesting OT services for patient. 1 time a week for 5 weeks.  Will work on ADL'S and left shoulder pain.  Would like to have SW consult to assist with Access to [facility]. Verbal approval was given.  Will route to PCP for approval or disapproval. Copied from CRM [id number]. Topic: Clinical - Home Health Verbal Orders >> Dec 10, 2023  2:11 PM Julie Oddi wrote: Caller/Agency: Marsha/[facility] Callback Number: [phone] Service Requested: Occupational Therapy - Also requesting social worker consult.  Frequency: 1x Week for 5 Weeks beginning 12/10/23 Any new concerns about the patient? Yes - Patient needs assistance in home. Patient has medical issues that she needs daily assistance with.

## 2023-12-12 ENCOUNTER — Telehealth: Payer: Self-pay

## 2023-12-12 DIAGNOSIS — F32A Depression, unspecified: Secondary | ICD-10-CM | POA: Diagnosis not present

## 2023-12-12 DIAGNOSIS — Z791 Long term (current) use of non-steroidal anti-inflammatories (NSAID): Secondary | ICD-10-CM | POA: Diagnosis not present

## 2023-12-12 DIAGNOSIS — G8929 Other chronic pain: Secondary | ICD-10-CM | POA: Diagnosis not present

## 2023-12-12 DIAGNOSIS — G43909 Migraine, unspecified, not intractable, without status migrainosus: Secondary | ICD-10-CM | POA: Diagnosis not present

## 2023-12-12 DIAGNOSIS — M5030 Other cervical disc degeneration, unspecified cervical region: Secondary | ICD-10-CM | POA: Diagnosis not present

## 2023-12-12 DIAGNOSIS — E66813 Obesity, class 3: Secondary | ICD-10-CM | POA: Diagnosis not present

## 2023-12-12 DIAGNOSIS — M1990 Unspecified osteoarthritis, unspecified site: Secondary | ICD-10-CM | POA: Diagnosis not present

## 2023-12-12 DIAGNOSIS — G709 Myoneural disorder, unspecified: Secondary | ICD-10-CM | POA: Diagnosis not present

## 2023-12-12 DIAGNOSIS — Z7951 Long term (current) use of inhaled steroids: Secondary | ICD-10-CM | POA: Diagnosis not present

## 2023-12-12 DIAGNOSIS — Z79899 Other long term (current) drug therapy: Secondary | ICD-10-CM | POA: Diagnosis not present

## 2023-12-12 DIAGNOSIS — Z87440 Personal history of urinary (tract) infections: Secondary | ICD-10-CM | POA: Diagnosis not present

## 2023-12-12 DIAGNOSIS — G9332 Myalgic encephalomyelitis/chronic fatigue syndrome: Secondary | ICD-10-CM | POA: Diagnosis not present

## 2023-12-12 DIAGNOSIS — J309 Allergic rhinitis, unspecified: Secondary | ICD-10-CM | POA: Diagnosis not present

## 2023-12-12 DIAGNOSIS — E039 Hypothyroidism, unspecified: Secondary | ICD-10-CM | POA: Diagnosis not present

## 2023-12-12 DIAGNOSIS — I10 Essential (primary) hypertension: Secondary | ICD-10-CM | POA: Diagnosis not present

## 2023-12-12 DIAGNOSIS — F419 Anxiety disorder, unspecified: Secondary | ICD-10-CM | POA: Diagnosis not present

## 2023-12-12 DIAGNOSIS — E119 Type 2 diabetes mellitus without complications: Secondary | ICD-10-CM | POA: Diagnosis not present

## 2023-12-12 DIAGNOSIS — M25512 Pain in left shoulder: Secondary | ICD-10-CM | POA: Diagnosis not present

## 2023-12-12 DIAGNOSIS — J4489 Other specified chronic obstructive pulmonary disease: Secondary | ICD-10-CM | POA: Diagnosis not present

## 2023-12-12 DIAGNOSIS — Z8673 Personal history of transient ischemic attack (TIA), and cerebral infarction without residual deficits: Secondary | ICD-10-CM | POA: Diagnosis not present

## 2023-12-12 DIAGNOSIS — Z5982 Transportation insecurity: Secondary | ICD-10-CM | POA: Diagnosis not present

## 2023-12-12 DIAGNOSIS — G4733 Obstructive sleep apnea (adult) (pediatric): Secondary | ICD-10-CM | POA: Diagnosis not present

## 2023-12-12 DIAGNOSIS — N200 Calculus of kidney: Secondary | ICD-10-CM | POA: Diagnosis not present

## 2023-12-12 DIAGNOSIS — M797 Fibromyalgia: Secondary | ICD-10-CM | POA: Diagnosis not present

## 2023-12-12 DIAGNOSIS — R471 Dysarthria and anarthria: Secondary | ICD-10-CM | POA: Diagnosis not present

## 2023-12-12 NOTE — Telephone Encounter (Signed)
 Peterson Brandt from [facility] called reporting the patient having UTI symptoms for 5 days with a increase in frequency and urgency. Reduced output and increased back pain. Peterson Brandt is requesting a verbal order for urine with culture. Peterson Brandt is also asking for a verbal order for skilled nursing eval. Please return Nicole's call @[phone].

## 2023-12-12 NOTE — Telephone Encounter (Unsigned)
 Copied from CRM [id number]. Topic: Clinical - Home Health Verbal Orders >> Dec 12, 2023 10:08 AM Opal Bill wrote: Caller/Agency: Nicole/[facility] Callback Number: na Service Requested: Skilled Nursing and Urinalysis Frequency: na Any new concerns about the patient? Na Peterson Brandt requested to speak with a nurse to request verbal orders. >> Dec 12, 2023  2:47 PM [name] Caster wrote: Caller/Agency: Nellie Banas from [facility] Callback Number: [phone] Service Requested: Nurse for urination and cultural Frequency: Patient has been Burning while urination for last 5 days request to send nurse for urination and cultural Any new concerns about the patient? no

## 2023-12-13 DIAGNOSIS — J4489 Other specified chronic obstructive pulmonary disease: Secondary | ICD-10-CM | POA: Diagnosis not present

## 2023-12-13 DIAGNOSIS — G4733 Obstructive sleep apnea (adult) (pediatric): Secondary | ICD-10-CM | POA: Diagnosis not present

## 2023-12-13 DIAGNOSIS — J309 Allergic rhinitis, unspecified: Secondary | ICD-10-CM | POA: Diagnosis not present

## 2023-12-13 DIAGNOSIS — E039 Hypothyroidism, unspecified: Secondary | ICD-10-CM | POA: Diagnosis not present

## 2023-12-13 DIAGNOSIS — Z87440 Personal history of urinary (tract) infections: Secondary | ICD-10-CM | POA: Diagnosis not present

## 2023-12-13 DIAGNOSIS — G43909 Migraine, unspecified, not intractable, without status migrainosus: Secondary | ICD-10-CM | POA: Diagnosis not present

## 2023-12-13 DIAGNOSIS — M5030 Other cervical disc degeneration, unspecified cervical region: Secondary | ICD-10-CM | POA: Diagnosis not present

## 2023-12-13 DIAGNOSIS — I10 Essential (primary) hypertension: Secondary | ICD-10-CM | POA: Diagnosis not present

## 2023-12-13 DIAGNOSIS — R471 Dysarthria and anarthria: Secondary | ICD-10-CM | POA: Diagnosis not present

## 2023-12-13 DIAGNOSIS — G9332 Myalgic encephalomyelitis/chronic fatigue syndrome: Secondary | ICD-10-CM | POA: Diagnosis not present

## 2023-12-13 DIAGNOSIS — Z7951 Long term (current) use of inhaled steroids: Secondary | ICD-10-CM | POA: Diagnosis not present

## 2023-12-13 DIAGNOSIS — Z8673 Personal history of transient ischemic attack (TIA), and cerebral infarction without residual deficits: Secondary | ICD-10-CM | POA: Diagnosis not present

## 2023-12-13 DIAGNOSIS — M25512 Pain in left shoulder: Secondary | ICD-10-CM | POA: Diagnosis not present

## 2023-12-13 DIAGNOSIS — G709 Myoneural disorder, unspecified: Secondary | ICD-10-CM | POA: Diagnosis not present

## 2023-12-13 DIAGNOSIS — G8929 Other chronic pain: Secondary | ICD-10-CM | POA: Diagnosis not present

## 2023-12-13 DIAGNOSIS — E119 Type 2 diabetes mellitus without complications: Secondary | ICD-10-CM | POA: Diagnosis not present

## 2023-12-13 DIAGNOSIS — F32A Depression, unspecified: Secondary | ICD-10-CM | POA: Diagnosis not present

## 2023-12-13 DIAGNOSIS — N200 Calculus of kidney: Secondary | ICD-10-CM | POA: Diagnosis not present

## 2023-12-13 DIAGNOSIS — Z791 Long term (current) use of non-steroidal anti-inflammatories (NSAID): Secondary | ICD-10-CM | POA: Diagnosis not present

## 2023-12-13 DIAGNOSIS — M1990 Unspecified osteoarthritis, unspecified site: Secondary | ICD-10-CM | POA: Diagnosis not present

## 2023-12-13 DIAGNOSIS — Z79899 Other long term (current) drug therapy: Secondary | ICD-10-CM | POA: Diagnosis not present

## 2023-12-13 DIAGNOSIS — F419 Anxiety disorder, unspecified: Secondary | ICD-10-CM | POA: Diagnosis not present

## 2023-12-13 DIAGNOSIS — Z5982 Transportation insecurity: Secondary | ICD-10-CM | POA: Diagnosis not present

## 2023-12-13 DIAGNOSIS — E66813 Obesity, class 3: Secondary | ICD-10-CM | POA: Diagnosis not present

## 2023-12-13 DIAGNOSIS — M797 Fibromyalgia: Secondary | ICD-10-CM | POA: Diagnosis not present

## 2023-12-13 DIAGNOSIS — Z87448 Personal history of other diseases of urinary system: Secondary | ICD-10-CM | POA: Diagnosis not present

## 2023-12-16 ENCOUNTER — Ambulatory Visit: Payer: Self-pay

## 2023-12-16 ENCOUNTER — Telehealth: Payer: Self-pay | Admitting: *Deleted

## 2023-12-16 DIAGNOSIS — R350 Frequency of micturition: Secondary | ICD-10-CM

## 2023-12-16 MED ORDER — NITROFURANTOIN MONOHYD MACRO 100 MG PO CAPS: 100.0000 mg | ORAL_CAPSULE | Freq: Two times a day (BID) | ORAL | 0 refills | Status: AC

## 2023-12-16 MED ORDER — NITROFURANTOIN MONOHYD MACRO 100 MG PO CAPS: 100.0000 mg | ORAL_CAPSULE | Freq: Two times a day (BID) | ORAL | 0 refills | Status: DC

## 2023-12-16 NOTE — Telephone Encounter (Addendum)
 Called and talked to the patient who has been having urinary frequency, dysuria, and flank pain for the past several days.  Her home health agency collected a urine sample with the goal to get a urinalysis order however this message was not reviewed by a provider until today.  She reports previous UTIs that have been responsive to Macrobid  however there are no recent positive urine cultures.  Most recent urine culture at beginning of May showed no growth but there were some calcium oxalate crystals without blood in the urine.  She also reported a short period of time where she was urinating less and felt like she passed a kidney stone at some point.  Discussed the emergent nature of the ureteral or bladder outlet obstruction and the need to be seen in emergency department if this is to happen.  Due to the patient's ongoing issues we will send an order for urinalysis with reflex to culture and hopefully her home health agency can collect and empirically treat with 10 days of Macrobid  100 mg twice daily.  Discussed return precautions.  I would recommend that she comes in for an inpatient visit however she is limited in her transportation and would prefer to only see me in the clinic.  I will at least try to set her up for her appointment when I am back in clinic in a few months.  I will call her again tomorrow and discuss the need to be seen in person even if it is not by me.

## 2023-12-16 NOTE — Telephone Encounter (Signed)
 FYI Only or Action Required?: FYI only for provider.  Patient was last seen in primary care on 10/29/2023 by Fernand Prost, MD. Called Nurse Triage reporting Urinary Tract Infection. Symptoms began a week ago. Interventions attempted: Rest, hydration, or home remedies. Symptoms are: gradually worsening.  Triage Disposition: See Physician Within 24 Hours  Patient/caregiver understands and will follow disposition?: Yes, will follow disposition  Copied from CRM [id number]. Topic: Clinical - Red Word Triage >> Dec 16, 2023  4:50 PM Graeme ORN wrote: Red Word that prompted transfer to Nurse Triage: Horrible UTI - Called for practice manager - about home health not being able to get in contact with provider about labs. Request on call provider. Reason for Disposition  Urinating more frequently than usual (i.e., frequency)  Answer Assessment - Initial Assessment Questions 1. SYMPTOM: What's the main symptom you're concerned about? (e.g., frequency, incontinence)     Feels as though she cannot empty the bladder, burning with urination, dark hazy urine, doesn't smell right 2. ONSET: When did it start?     Approx 2 weeks  Pt states that her HH has not been able to get orders for a UA. Pt states that she is homebound and they typically will do orders for the [facility] nurse. Pt states that a UA was collected last week but there is no labs pending in her EMR, nor can the pt see her UA pending in the [facility] platform that she has access too. Pt is concerned that her messages are not getting to the clinic as they are not responsive to her or her [facility] nurses. Pt states that her s/s have worsened and at times pt states that she is not able to empty her bladder she also states at times that she cannot void. Pt advised to utilize the ED if she cannot void. Pt states that she has no way to get there and that dispatch had told her that urinary symptoms are not an emergency. Pt scheduled for a VUC visit for  tomorrow.  Protocols used: Urinary Symptoms-A-AH

## 2023-12-16 NOTE — Telephone Encounter (Signed)
 Unable to see any new urinalysis results.  Further discussion of patient call and other visit from same date.

## 2023-12-16 NOTE — Addendum Note (Signed)
 Addended by: Shray Hunley on: 12/16/2023 05:54 PM   Modules accepted: Orders

## 2023-12-16 NOTE — Addendum Note (Signed)
 Addended by: Kelle Ruppert on: 12/16/2023 05:57 PM   Modules accepted: Orders

## 2023-12-16 NOTE — Addendum Note (Signed)
 Addended by: Anjanette Gilkey on: 12/16/2023 05:40 PM   Modules accepted: Orders

## 2023-12-17 ENCOUNTER — Telehealth: Admitting: Physician Assistant

## 2023-12-17 DIAGNOSIS — E119 Type 2 diabetes mellitus without complications: Secondary | ICD-10-CM | POA: Diagnosis not present

## 2023-12-17 DIAGNOSIS — Z791 Long term (current) use of non-steroidal anti-inflammatories (NSAID): Secondary | ICD-10-CM | POA: Diagnosis not present

## 2023-12-17 DIAGNOSIS — M25512 Pain in left shoulder: Secondary | ICD-10-CM | POA: Diagnosis not present

## 2023-12-17 DIAGNOSIS — Z5982 Transportation insecurity: Secondary | ICD-10-CM | POA: Diagnosis not present

## 2023-12-17 DIAGNOSIS — Z87440 Personal history of urinary (tract) infections: Secondary | ICD-10-CM | POA: Diagnosis not present

## 2023-12-17 DIAGNOSIS — F419 Anxiety disorder, unspecified: Secondary | ICD-10-CM | POA: Diagnosis not present

## 2023-12-17 DIAGNOSIS — R109 Unspecified abdominal pain: Secondary | ICD-10-CM

## 2023-12-17 DIAGNOSIS — M5030 Other cervical disc degeneration, unspecified cervical region: Secondary | ICD-10-CM | POA: Diagnosis not present

## 2023-12-17 DIAGNOSIS — F32A Depression, unspecified: Secondary | ICD-10-CM | POA: Diagnosis not present

## 2023-12-17 DIAGNOSIS — Z8673 Personal history of transient ischemic attack (TIA), and cerebral infarction without residual deficits: Secondary | ICD-10-CM | POA: Diagnosis not present

## 2023-12-17 DIAGNOSIS — G709 Myoneural disorder, unspecified: Secondary | ICD-10-CM | POA: Diagnosis not present

## 2023-12-17 DIAGNOSIS — G9332 Myalgic encephalomyelitis/chronic fatigue syndrome: Secondary | ICD-10-CM | POA: Diagnosis not present

## 2023-12-17 DIAGNOSIS — I10 Essential (primary) hypertension: Secondary | ICD-10-CM | POA: Diagnosis not present

## 2023-12-17 DIAGNOSIS — J309 Allergic rhinitis, unspecified: Secondary | ICD-10-CM | POA: Diagnosis not present

## 2023-12-17 DIAGNOSIS — E039 Hypothyroidism, unspecified: Secondary | ICD-10-CM | POA: Diagnosis not present

## 2023-12-17 DIAGNOSIS — G8929 Other chronic pain: Secondary | ICD-10-CM | POA: Diagnosis not present

## 2023-12-17 DIAGNOSIS — M797 Fibromyalgia: Secondary | ICD-10-CM | POA: Diagnosis not present

## 2023-12-17 DIAGNOSIS — J4489 Other specified chronic obstructive pulmonary disease: Secondary | ICD-10-CM | POA: Diagnosis not present

## 2023-12-17 DIAGNOSIS — G43909 Migraine, unspecified, not intractable, without status migrainosus: Secondary | ICD-10-CM | POA: Diagnosis not present

## 2023-12-17 DIAGNOSIS — G4733 Obstructive sleep apnea (adult) (pediatric): Secondary | ICD-10-CM | POA: Diagnosis not present

## 2023-12-17 DIAGNOSIS — M1990 Unspecified osteoarthritis, unspecified site: Secondary | ICD-10-CM | POA: Diagnosis not present

## 2023-12-17 DIAGNOSIS — Z79899 Other long term (current) drug therapy: Secondary | ICD-10-CM | POA: Diagnosis not present

## 2023-12-17 DIAGNOSIS — Z7951 Long term (current) use of inhaled steroids: Secondary | ICD-10-CM | POA: Diagnosis not present

## 2023-12-17 DIAGNOSIS — E66813 Obesity, class 3: Secondary | ICD-10-CM | POA: Diagnosis not present

## 2023-12-17 DIAGNOSIS — N200 Calculus of kidney: Secondary | ICD-10-CM | POA: Diagnosis not present

## 2023-12-17 DIAGNOSIS — R471 Dysarthria and anarthria: Secondary | ICD-10-CM | POA: Diagnosis not present

## 2023-12-17 NOTE — Patient Instructions (Signed)
 Chakita DELENA Molt, thank you for joining Elsie Velma Lunger, PA-C for today's virtual visit.  While this provider is not your primary care provider (PCP), if your PCP is located in our provider database this encounter information will be shared with them immediately following your visit.   [facility] MyChart account gives you access to today's visit and all your visits, tests, and labs performed at [facility]  click here if you don't have a [facility] MyChart account or go to [email or web address]  Consent: (Patient) Tieisha A Foley provided verbal consent for this virtual visit at the beginning of the encounter.  Current Medications:  Current Outpatient Medications:    albuterol  (VENTOLIN  HFA) 108 (90 Base) MCG/ACT inhaler, Inhale 1-2 puffs into the lungs every 6 (six) hours as needed for wheezing or shortness of breath., Disp: 1 each, Rfl: 0   amitriptyline  (ELAVIL ) 100 MG tablet, Take 1 tablet (100 mg total) by mouth at bedtime., Disp: 30 tablet, Rfl: 0   amLODipine  (NORVASC ) 10 MG tablet, Take 1 tablet (10 mg total) by mouth daily., Disp: 90 tablet, Rfl: 3   Blood Glucose Monitoring Suppl (ONETOUCH VERIO) w/Device KIT, 1 each by Does not apply route as needed., Disp: 1 kit, Rfl: 0   diazepam  (VALIUM ) 5 MG tablet, Take 1 tablet (5 mg total) by mouth every 8 (eight) hours as needed for anxiety., Disp: 60 tablet, Rfl: 0   fluticasone  (FLONASE ) 50 MCG/ACT nasal spray, Place 1 spray into both nostrils daily., Disp: 16 g, Rfl: 11   Fluticasone -Umeclidin-Vilant (TRELEGY ELLIPTA ) 200-62.5-25 MCG/ACT AEPB, Inhale 1 Inhalation into the lungs daily at 6 (six) AM., Disp: 60 each, Rfl: 1   furosemide  (LASIX ) 40 MG tablet, Take 1 tablet (40 mg total) by mouth 2 (two) times daily., Disp: 60 tablet, Rfl: 11   glucose blood test strip, Use as instructed to check blood sugar three times daily, Disp: 100 each, Rfl: prn   Lancets Thin MISC, Use to check home glucose three times daily, Disp:  100 each, Rfl: prn   levothyroxine  (SYNTHROID ) 50 MCG tablet, Take 1 tablet (50 mcg total) by mouth daily before breakfast., Disp: 90 tablet, Rfl: 3   meclizine  (ANTIVERT ) 25 MG tablet, Take 1 tablet (25 mg total) by mouth 3 (three) times daily as needed for dizziness., Disp: 30 tablet, Rfl: 0   meloxicam  (MOBIC ) 7.5 MG tablet, TAKE 2 TABLETS BY MOUTH IN THE MORNING AND AT BEDTIME, Disp: 120 tablet, Rfl: 0   metoprolol  tartrate (LOPRESSOR ) 100 MG tablet, Take 1 tablet (100 mg total) by mouth 2 (two) times daily., Disp: 180 tablet, Rfl: 3   Nerve Stimulator (PRO COMFORT TENS UNIT) DEVI, 1 Device by Does not apply route daily as needed., Disp: 1 Device, Rfl: 1   nitrofurantoin , macrocrystal-monohydrate, (MACROBID ) 100 MG capsule, Take 1 capsule (100 mg total) by mouth 2 (two) times daily for 5 days., Disp: 10 capsule, Rfl: 0   nystatin  cream (MYCOSTATIN ), Apply 1 Application topically 2 (two) times daily., Disp: 30 g, Rfl: 11   ondansetron  (ZOFRAN -ODT) 8 MG disintegrating tablet, Take 1 tablet (8 mg total) by mouth every 8 (eight) hours as needed for nausea or vomiting., Disp: 20 tablet, Rfl: 0   potassium chloride  (KLOR-CON  M) 10 MEQ tablet, Take 1 tablet (10 mEq total) by mouth 2 (two) times daily., Disp: 60 tablet, Rfl: 3   triamcinolone  (KENALOG ) 0.025 % ointment, Apply 1 Application topically 2 (two) times daily., Disp: 30 g, Rfl: 0   Triamcinolone  Acetonide (  TRIAMCINOLONE  0.1 % CREAM : EUCERIN) CREA, Apply 1 Application topically daily as needed., Disp: 1 each, Rfl: 3   valACYclovir  (VALTREX ) 1000 MG tablet, Take 1 tablet (1,000 mg total) by mouth daily., Disp: 30 tablet, Rfl: 3   Medications ordered in this encounter:  No orders of the defined types were placed in this encounter.    *If you need refills on other medications prior to your next appointment, please contact your pharmacy*  Follow-Up: Call back or seek an in-person evaluation if the symptoms worsen or if the condition fails to  improve as anticipated.  [facility] [phone]  Other Instructions Based on what you shared with me, I feel your condition warrants further evaluation as soon as possible at an Emergency department.    Make sure to request a Patient Advocate as discussed.    Emergency Department-[facility]  Get Driving Directions  [phone]  [address]  [place], [place] [address]  Open 24/7/365      [facility] Department at [facility]  Get Driving Directions  [address]  [place], [place] [address]  Open 24/7/365    Emergency Department- [facility]  Get Driving Directions  [phone]  [address]  [place], [place] [address]  Open 24/7/365      [facility] at [facility]  Get Driving Directions  [phone]  [address]  [place], [place] [address]  Open 24/7/365    [facility]  Emergency Department- [facility]  Get Driving Directions  [phone]  [address]  [place], [place] [address]  Open 24/7/365    [place]  Emergency Department- [facility]  Get Driving Directions  [address]  [place], [place] [address]  Open 24/7/365    [facility]  Emergency Department- [place] [facility]  Get Driving Directions  [phone]  [address]  [place], [place] [address]  Open 24/7/365      If you have been instructed to have an in-person evaluation today at a local Urgent Care facility, please use the link below. It will take you to a list of all of our available [facility], including address, phone number and hours of operation. Please do not delay care.  [facility]  If you or a family member do not have a primary care provider, use the link below to schedule a visit and establish care. When you choose a [facility] primary care physician or  advanced practice provider, you gain a long-term partner in health. Find a Primary Care Provider  Learn more about [place]'s in-office and virtual care options: [facility] - Get Care Now

## 2023-12-17 NOTE — Progress Notes (Signed)
 Virtual Visit Consent   Brianna Roberts, you are scheduled for a virtual visit with a [facility] provider today. Just as with appointments in the office, your consent must be obtained to participate. Your consent will be active for this visit and any virtual visit you may have with one of our providers in the next 365 days. If you have a MyChart account, a copy of this consent can be sent to you electronically.  As this is a virtual visit, video technology does not allow for your provider to perform a traditional examination. This may limit your provider's ability to fully assess your condition. If your provider identifies any concerns that need to be evaluated in person or the need to arrange testing (such as labs, EKG, etc.), we will make arrangements to do so. Although advances in technology are sophisticated, we cannot ensure that it will always work on either your end or our end. If the connection with a video visit is poor, the visit may have to be switched to a telephone visit. With either a video or telephone visit, we are not always able to ensure that we have a secure connection.  By engaging in this virtual visit, you consent to the provision of healthcare and authorize for your insurance to be billed (if applicable) for the services provided during this visit. Depending on your insurance coverage, you may receive a charge related to this service.  I need to obtain your verbal consent now. Are you willing to proceed with your visit today? Brianna Roberts has provided verbal consent on 12/17/2023 for a virtual visit (video or telephone). Brianna Roberts, [place]  Date: 12/17/2023 8:03 AM   Virtual Visit via Video Note   I, Brianna Roberts, connected with  Brianna Roberts  ([id number], [id number]) on 12/17/23 at  7:45 AM EDT by a video-enabled telemedicine application and verified that I am speaking with the correct person using two identifiers.  Location: Patient: Virtual Visit  Location Patient: Home Provider: Virtual Visit Location Provider: Home Office   I discussed the limitations of evaluation and management by telemedicine and the availability of in person appointments. The patient expressed understanding and agreed to proceed.    History of Present Illness: Brianna Roberts is a 58 y.o. who identifies as a female who was assigned female at birth, and is being seen today for progressively worsening dysuria, urgency, hesitancy and flank pain. Spoke with Dr. Jolaine last night who is having HH come out to draw a UA and culture. Sent in script for Macrobid  that she has been unable to get yet -- waiting on pharmacy to open. Notes she can urinate but not very well.  Hx of kidney stones -- notes passing last one she is aware of in May.     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) 10/05/2016   Fibromyalgia 10/05/2016   Benign essential HTN 10/05/2016   Hypothyroidism 10/05/2016   Chronic fatigue syndrome 10/05/2016   Anxiety and depression 10/05/2016   Nephrolithiasis 10/05/2016   IBS (irritable bowel syndrome) 10/05/2016   OSA (obstructive sleep apnea)  10/05/2016   BMI 45.0-49.9, adult (HCC) 10/05/2016    Allergies:  Allergies  Allergen Reactions   Hydromorphone Itching   Iodinated Contrast Media Anaphylaxis, Swelling and Other (See Comments)    Patient stated this was a one off Patient complained of throat/ uvula swelling s/p injection on 02/01/2017 but states she isnt allergic to the dye and has had several times prior     Sulfa Antibiotics  Anaphylaxis   Codeine Other (See Comments)    Patient said mother told her she was allergic as a child; patient states she is not allergic   Nickel Rash    Patient states she is not allergic   Penicillins Rash and Other (See Comments)    Told she was allergic from childhood: patient states she is not allergic   Spironolactone Nausea Only and Other (See Comments)    Caused GI isues   Cefdinir Hives   Adhesive [Tape] Rash   Medications:  Current Outpatient Medications:    albuterol  (VENTOLIN  HFA) 108 (90 Base) MCG/ACT inhaler, Inhale 1-2 puffs into the lungs every 6 (six) hours as needed for wheezing or shortness of breath., Disp: 1 each, Rfl: 0   amitriptyline  (ELAVIL ) 100 MG tablet, Take 1 tablet (100 mg total) by mouth at bedtime., Disp: 30 tablet, Rfl: 0   amLODipine  (NORVASC ) 10 MG tablet, Take 1 tablet (10 mg total) by mouth daily., Disp: 90 tablet, Rfl: 3   Blood Glucose Monitoring Suppl (ONETOUCH VERIO) w/Device KIT, 1 each by Does not apply route as needed., Disp: 1 kit, Rfl: 0   diazepam  (VALIUM ) 5 MG tablet, Take 1 tablet (5 mg total) by mouth every 8 (eight) hours as needed for anxiety., Disp: 60 tablet, Rfl: 0   fluticasone  (FLONASE ) 50 MCG/ACT nasal spray, Place 1 spray into both nostrils daily., Disp: 16 g, Rfl: 11   Fluticasone -Umeclidin-Vilant (TRELEGY ELLIPTA ) 200-62.5-25 MCG/ACT AEPB, Inhale 1 Inhalation into the lungs daily at 6 (six) AM., Disp: 60 each, Rfl: 1   furosemide  (LASIX ) 40 MG tablet, Take 1 tablet (40 mg total) by mouth 2 (two) times daily., Disp: 60 tablet, Rfl: 11   glucose blood test strip, Use as instructed to check blood sugar three times daily, Disp: 100 each, Rfl: prn   Lancets Thin MISC, Use to check home glucose three times daily, Disp: 100 each, Rfl: prn   levothyroxine  (SYNTHROID ) 50 MCG tablet, Take 1 tablet (50 mcg total) by mouth daily before breakfast., Disp: 90 tablet, Rfl: 3   meclizine  (ANTIVERT ) 25 MG tablet, Take 1 tablet (25 mg total) by  mouth 3 (three) times daily as needed for dizziness., Disp: 30 tablet, Rfl: 0   meloxicam  (MOBIC ) 7.5 MG tablet, TAKE 2 TABLETS BY MOUTH IN THE MORNING AND AT BEDTIME, Disp: 120 tablet, Rfl: 0   metoprolol  tartrate (LOPRESSOR ) 100 MG tablet, Take 1 tablet (100 mg total) by mouth 2 (two) times daily., Disp: 180 tablet, Rfl: 3   Nerve Stimulator (PRO COMFORT TENS UNIT) DEVI, 1 Device by Does not apply route daily as needed., Disp: 1 Device, Rfl: 1   nitrofurantoin , macrocrystal-monohydrate, (MACROBID ) 100 MG capsule, Take 1 capsule (100 mg total) by mouth 2 (two) times daily for 5 days., Disp: 10 capsule, Rfl: 0   nystatin  cream (MYCOSTATIN ), Apply 1 Application topically 2 (two) times daily., Disp: 30 g, Rfl: 11   ondansetron  (ZOFRAN -ODT) 8 MG disintegrating tablet, Take 1 tablet (8 mg total) by mouth every 8 (eight) hours as needed for nausea or vomiting., Disp:  20 tablet, Rfl: 0   potassium chloride  (KLOR-CON  M) 10 MEQ tablet, Take 1 tablet (10 mEq total) by mouth 2 (two) times daily., Disp: 60 tablet, Rfl: 3   triamcinolone  (KENALOG ) 0.025 % ointment, Apply 1 Application topically 2 (two) times daily., Disp: 30 g, Rfl: 0   Triamcinolone  Acetonide (TRIAMCINOLONE  0.1 % CREAM : EUCERIN) CREA, Apply 1 Application topically daily as needed., Disp: 1 each, Rfl: 3   valACYclovir  (VALTREX ) 1000 MG tablet, Take 1 tablet (1,000 mg total) by mouth daily., Disp: 30 tablet, Rfl: 3  Observations/Objective: Patient is well-developed, well-nourished in no acute distress.  Resting comfortably at home.  Head is normocephalic, atraumatic.  No labored breathing. Speech is clear and coherent with logical content.  Patient is alert and oriented at baseline.   Assessment and Plan: 1. Flank pain (Primary)  Concern for pyelonephritis. She needs in person evaluation but transportation is a large issue for her.  Discussed need to call 911 but she is worried about transportation home from the hospital. Discussed  requesting patient advocate while in the ER who can help facilitate this for her.   Follow Up Instructions: I discussed the assessment and treatment plan with the patient. The patient was provided an opportunity to ask questions and all were answered. The patient agreed with the plan and demonstrated an understanding of the instructions.  A copy of instructions were sent to the patient via MyChart unless otherwise noted below.   The patient was advised to call back or seek an in-person evaluation if the symptoms worsen or if the condition fails to improve as anticipated.    Brianna Velma Lunger, PA-C

## 2023-12-18 ENCOUNTER — Encounter: Payer: Self-pay | Admitting: Student

## 2023-12-19 DIAGNOSIS — Z7951 Long term (current) use of inhaled steroids: Secondary | ICD-10-CM | POA: Diagnosis not present

## 2023-12-19 DIAGNOSIS — Z87440 Personal history of urinary (tract) infections: Secondary | ICD-10-CM | POA: Diagnosis not present

## 2023-12-19 DIAGNOSIS — G8929 Other chronic pain: Secondary | ICD-10-CM | POA: Diagnosis not present

## 2023-12-19 DIAGNOSIS — M797 Fibromyalgia: Secondary | ICD-10-CM | POA: Diagnosis not present

## 2023-12-19 DIAGNOSIS — Z5982 Transportation insecurity: Secondary | ICD-10-CM | POA: Diagnosis not present

## 2023-12-19 DIAGNOSIS — E119 Type 2 diabetes mellitus without complications: Secondary | ICD-10-CM | POA: Diagnosis not present

## 2023-12-19 DIAGNOSIS — G43909 Migraine, unspecified, not intractable, without status migrainosus: Secondary | ICD-10-CM | POA: Diagnosis not present

## 2023-12-19 DIAGNOSIS — F32A Depression, unspecified: Secondary | ICD-10-CM | POA: Diagnosis not present

## 2023-12-19 DIAGNOSIS — Z791 Long term (current) use of non-steroidal anti-inflammatories (NSAID): Secondary | ICD-10-CM | POA: Diagnosis not present

## 2023-12-19 DIAGNOSIS — E66813 Obesity, class 3: Secondary | ICD-10-CM | POA: Diagnosis not present

## 2023-12-19 DIAGNOSIS — M1990 Unspecified osteoarthritis, unspecified site: Secondary | ICD-10-CM | POA: Diagnosis not present

## 2023-12-19 DIAGNOSIS — Z79899 Other long term (current) drug therapy: Secondary | ICD-10-CM | POA: Diagnosis not present

## 2023-12-19 DIAGNOSIS — J4489 Other specified chronic obstructive pulmonary disease: Secondary | ICD-10-CM | POA: Diagnosis not present

## 2023-12-19 DIAGNOSIS — I10 Essential (primary) hypertension: Secondary | ICD-10-CM | POA: Diagnosis not present

## 2023-12-19 DIAGNOSIS — F411 Generalized anxiety disorder: Secondary | ICD-10-CM | POA: Diagnosis not present

## 2023-12-19 DIAGNOSIS — G4733 Obstructive sleep apnea (adult) (pediatric): Secondary | ICD-10-CM | POA: Diagnosis not present

## 2023-12-19 DIAGNOSIS — G9332 Myalgic encephalomyelitis/chronic fatigue syndrome: Secondary | ICD-10-CM | POA: Diagnosis not present

## 2023-12-19 DIAGNOSIS — F341 Dysthymic disorder: Secondary | ICD-10-CM | POA: Diagnosis not present

## 2023-12-19 DIAGNOSIS — R471 Dysarthria and anarthria: Secondary | ICD-10-CM | POA: Diagnosis not present

## 2023-12-19 DIAGNOSIS — F419 Anxiety disorder, unspecified: Secondary | ICD-10-CM | POA: Diagnosis not present

## 2023-12-19 DIAGNOSIS — M5030 Other cervical disc degeneration, unspecified cervical region: Secondary | ICD-10-CM | POA: Diagnosis not present

## 2023-12-19 DIAGNOSIS — E039 Hypothyroidism, unspecified: Secondary | ICD-10-CM | POA: Diagnosis not present

## 2023-12-19 DIAGNOSIS — J309 Allergic rhinitis, unspecified: Secondary | ICD-10-CM | POA: Diagnosis not present

## 2023-12-19 DIAGNOSIS — G709 Myoneural disorder, unspecified: Secondary | ICD-10-CM | POA: Diagnosis not present

## 2023-12-19 DIAGNOSIS — Z8673 Personal history of transient ischemic attack (TIA), and cerebral infarction without residual deficits: Secondary | ICD-10-CM | POA: Diagnosis not present

## 2023-12-19 DIAGNOSIS — N200 Calculus of kidney: Secondary | ICD-10-CM | POA: Diagnosis not present

## 2023-12-19 DIAGNOSIS — M25512 Pain in left shoulder: Secondary | ICD-10-CM | POA: Diagnosis not present

## 2023-12-23 ENCOUNTER — Telehealth: Payer: Self-pay | Admitting: *Deleted

## 2023-12-23 NOTE — Telephone Encounter (Unsigned)
 Copied from CRM [id number]. Topic: General - Other >> Dec 16, 2023  2:52 PM Cherylann RAMAN wrote: Reason for CRM: Caitlyn with [facility] requesting an update on a form that was signed by the incorrect provider. Tx the call to CAL successfully. >> Dec 23, 2023 12:39 PM Graeme ORN wrote: Katelyn from [facility] called. Request to speak to [facility]. Office currently closed for lunch. Would like a call back at  [phone]  Following up on orders. Thank You.

## 2023-12-24 ENCOUNTER — Telehealth: Payer: Self-pay | Admitting: Student

## 2023-12-24 ENCOUNTER — Encounter: Payer: Self-pay | Admitting: Internal Medicine

## 2023-12-24 NOTE — Telephone Encounter (Signed)
 Patient has an appointment on 12/26/23 at 3 pm with Dr. Dawson Tan.  Spoke with patient and she does not want [facility] to come back on.  She will wait for appointment with Dr. Tan on Thursday.   Copied from CRM [id number]. Topic: Referral - Request for Referral >> Dec 23, 2023  4:40 PM Fredrica W wrote: Did the patient discuss referral with their provider in the last year? Yes (If No - schedule appointment) (If Yes - send message)  Appointment offered? No  Type of order/referral and detailed reason for visit: Urinalysis came back clear patient still having symptoms request referral to Urology -[place] with [facility] Called to make referral request  Preference of office, provider, location: N/A  If referral order, have you been seen by this specialty before? N/A (If Yes, this issue or another issue? When? Where?  Can we respond through MyChart? No

## 2023-12-24 NOTE — Telephone Encounter (Signed)
 Pt has an appt with Dr Waymond on Thursday 12/26/23.

## 2023-12-25 DIAGNOSIS — R471 Dysarthria and anarthria: Secondary | ICD-10-CM | POA: Diagnosis not present

## 2023-12-25 DIAGNOSIS — J4489 Other specified chronic obstructive pulmonary disease: Secondary | ICD-10-CM | POA: Diagnosis not present

## 2023-12-25 DIAGNOSIS — Z8673 Personal history of transient ischemic attack (TIA), and cerebral infarction without residual deficits: Secondary | ICD-10-CM | POA: Diagnosis not present

## 2023-12-25 DIAGNOSIS — G43909 Migraine, unspecified, not intractable, without status migrainosus: Secondary | ICD-10-CM | POA: Diagnosis not present

## 2023-12-25 DIAGNOSIS — E039 Hypothyroidism, unspecified: Secondary | ICD-10-CM | POA: Diagnosis not present

## 2023-12-25 DIAGNOSIS — Z5982 Transportation insecurity: Secondary | ICD-10-CM | POA: Diagnosis not present

## 2023-12-25 DIAGNOSIS — F32A Depression, unspecified: Secondary | ICD-10-CM | POA: Diagnosis not present

## 2023-12-25 DIAGNOSIS — E66813 Obesity, class 3: Secondary | ICD-10-CM | POA: Diagnosis not present

## 2023-12-25 DIAGNOSIS — G709 Myoneural disorder, unspecified: Secondary | ICD-10-CM | POA: Diagnosis not present

## 2023-12-25 DIAGNOSIS — M5030 Other cervical disc degeneration, unspecified cervical region: Secondary | ICD-10-CM | POA: Diagnosis not present

## 2023-12-25 DIAGNOSIS — F419 Anxiety disorder, unspecified: Secondary | ICD-10-CM | POA: Diagnosis not present

## 2023-12-25 DIAGNOSIS — Z87440 Personal history of urinary (tract) infections: Secondary | ICD-10-CM | POA: Diagnosis not present

## 2023-12-25 DIAGNOSIS — J309 Allergic rhinitis, unspecified: Secondary | ICD-10-CM | POA: Diagnosis not present

## 2023-12-25 DIAGNOSIS — M1990 Unspecified osteoarthritis, unspecified site: Secondary | ICD-10-CM | POA: Diagnosis not present

## 2023-12-25 DIAGNOSIS — Z791 Long term (current) use of non-steroidal anti-inflammatories (NSAID): Secondary | ICD-10-CM | POA: Diagnosis not present

## 2023-12-25 DIAGNOSIS — Z7951 Long term (current) use of inhaled steroids: Secondary | ICD-10-CM | POA: Diagnosis not present

## 2023-12-25 DIAGNOSIS — G4733 Obstructive sleep apnea (adult) (pediatric): Secondary | ICD-10-CM | POA: Diagnosis not present

## 2023-12-25 DIAGNOSIS — M797 Fibromyalgia: Secondary | ICD-10-CM | POA: Diagnosis not present

## 2023-12-25 DIAGNOSIS — Z79899 Other long term (current) drug therapy: Secondary | ICD-10-CM | POA: Diagnosis not present

## 2023-12-25 DIAGNOSIS — M25512 Pain in left shoulder: Secondary | ICD-10-CM | POA: Diagnosis not present

## 2023-12-25 DIAGNOSIS — N200 Calculus of kidney: Secondary | ICD-10-CM | POA: Diagnosis not present

## 2023-12-25 DIAGNOSIS — I10 Essential (primary) hypertension: Secondary | ICD-10-CM | POA: Diagnosis not present

## 2023-12-25 DIAGNOSIS — G8929 Other chronic pain: Secondary | ICD-10-CM | POA: Diagnosis not present

## 2023-12-25 DIAGNOSIS — E119 Type 2 diabetes mellitus without complications: Secondary | ICD-10-CM | POA: Diagnosis not present

## 2023-12-25 DIAGNOSIS — G9332 Myalgic encephalomyelitis/chronic fatigue syndrome: Secondary | ICD-10-CM | POA: Diagnosis not present

## 2023-12-25 NOTE — Assessment & Plan Note (Deleted)
-  Patient has long history of right radiating groin pain with urination and dysuria -Placed urology referral to complete workup of this ongoing issue -Unclear if urinalysis/culture was done by home health -may need CT renal study if symptoms persist

## 2023-12-25 NOTE — Assessment & Plan Note (Deleted)
-  last A1c 6.5 on 08/30/23 -T2DM is well controlled on lifestyle changes -previously prescribed GLP-1 agonists but pt refused because she feels her condition is well controlled and her weight loss is sufficient without medication -

## 2023-12-25 NOTE — Progress Notes (Deleted)
 Patient name: Brianna Roberts Date of birth: [birth year] Date of visit: 12/25/23  Type of visit: Acute Office Visit   Subjective   Chief concern: No chief complaint on file.   Aliece A Avera is a 58 y.o. female with a PMHx of T2DM, HTN, Class III Obesity, COPD, and nephrolithiasis who presents to [facility] clinic for evaluation of recurrent kidney stones.   Medical referral to urology? UA with reflex to culture?  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) 10/05/2016   Fibromyalgia 10/05/2016   Benign essential HTN 10/05/2016   Hypothyroidism 10/05/2016   Chronic fatigue syndrome 10/05/2016   Anxiety and depression 10/05/2016   Nephrolithiasis 10/05/2016   IBS (irritable bowel syndrome) 10/05/2016   OSA (obstructive sleep apnea) 10/05/2016   BMI 45.0-49.9, adult (HCC) 10/05/2016     Past Surgical History:  Procedure Laterality Date   ABDOMINAL HYSTERECTOMY     Partial-ovaries and tubes remain   BREAST BIOPSY Left    CHOLECYSTECTOMY  2006   COLONOSCOPY  07/27/2015   [place] MD   HERNIA REPAIR  1997   TUMOR EXCISION Right 1984   Sinuses   TUMOR EXCISION Left 2015   sinuses   URACHAL CYST EXCISION  1997    ROS  Current Outpatient Medications  Medication Instructions   albuterol  (VENTOLIN  HFA) 108 (90 Base) MCG/ACT inhaler 1-2 puffs, Inhalation, Every 6 hours PRN   amitriptyline  (ELAVIL ) 100 mg, Oral, Daily at bedtime   amLODipine  (NORVASC ) 10 mg, Oral, Daily   Blood Glucose Monitoring Suppl (ONETOUCH VERIO) w/Device KIT 1 each, Does not apply, As needed   diazepam   (VALIUM ) 5 mg, Oral, Every 8 hours PRN   fluticasone  (FLONASE ) 50 MCG/ACT nasal spray 1 spray, Each Nare, Daily   Fluticasone -Umeclidin-Vilant (TRELEGY ELLIPTA ) 200-62.5-25 MCG/ACT AEPB 1 Inhalation, Inhalation, Daily   furosemide  (LASIX ) 40 mg, Oral, 2 times daily   glucose blood test strip Use as instructed to check blood sugar three times daily   Lancets Thin MISC Use to check home glucose three times daily   levothyroxine  (SYNTHROID ) 50 mcg, Oral, Daily before breakfast   meclizine  (ANTIVERT ) 25 mg, Oral, 3 times daily PRN   meloxicam  (MOBIC ) 7.5 MG tablet TAKE 2 TABLETS BY MOUTH IN THE MORNING AND AT BEDTIME   metoprolol  tartrate (LOPRESSOR ) 100 mg, Oral, 2 times daily   Nerve Stimulator (PRO COMFORT TENS UNIT) DEVI 1 Device, Does not apply, Daily PRN   nystatin  cream (MYCOSTATIN ) 1 Application, Topical, 2 times daily   ondansetron  (ZOFRAN -ODT) 8 mg, Oral, Every 8 hours PRN   potassium chloride  (KLOR-CON  M) 10 MEQ tablet 10 mEq, Oral, 2 times daily   triamcinolone  (KENALOG ) 0.025 % ointment 1 Application, Topical, 2 times daily   Triamcinolone  Acetonide (TRIAMCINOLONE  0.1 % CREAM : EUCERIN) CREA 1 Application, Topical, Daily PRN   valACYclovir  (VALTREX ) 1,000 mg, Oral, Daily    Social History   Tobacco Use   Smoking status: Every Day    Current packs/day: 0.30    Average packs/day: 0.3 packs/day for 33.0 years (9.9 ttl pk-yrs)    Types: Cigarettes   Smokeless tobacco:  Never  Vaping Use   Vaping status: Never Used  Substance Use Topics   Alcohol  use: No   Drug use: No      Objective  There were no vitals filed for this visit.There is no height or weight on file to calculate BMI.   Physical Exam  {Labs (Optional):23779}    Assessment & Plan  Problem List Items Addressed This Visit       Cardiovascular and Mediastinum   Benign essential HTN     Respiratory   COPD exacerbation (HCC)     Endocrine   Type 2 diabetes mellitus without complication, without long-term  current use of insulin (HCC)     Genitourinary   Nephrolithiasis - Primary    No follow-ups on file.  Patient discussed with Dr. {imcattendings:[id number]}, who also saw and evaluated the patient.  Gweneth Fredlund, MD [facility]  PGY-1 12/25/2023, 8:41 PM

## 2023-12-25 NOTE — Telephone Encounter (Signed)
 I contacted Brianna Roberts w/[facility]  back, no answer. I left message for her to call the clinic back.

## 2023-12-26 ENCOUNTER — Encounter

## 2023-12-26 ENCOUNTER — Other Ambulatory Visit: Payer: Self-pay

## 2023-12-26 ENCOUNTER — Telehealth: Payer: Self-pay | Admitting: *Deleted

## 2023-12-26 DIAGNOSIS — E039 Hypothyroidism, unspecified: Secondary | ICD-10-CM | POA: Diagnosis not present

## 2023-12-26 DIAGNOSIS — E119 Type 2 diabetes mellitus without complications: Secondary | ICD-10-CM | POA: Diagnosis not present

## 2023-12-26 DIAGNOSIS — Z87440 Personal history of urinary (tract) infections: Secondary | ICD-10-CM | POA: Diagnosis not present

## 2023-12-26 DIAGNOSIS — Z5982 Transportation insecurity: Secondary | ICD-10-CM | POA: Diagnosis not present

## 2023-12-26 DIAGNOSIS — Z791 Long term (current) use of non-steroidal anti-inflammatories (NSAID): Secondary | ICD-10-CM | POA: Diagnosis not present

## 2023-12-26 DIAGNOSIS — G4733 Obstructive sleep apnea (adult) (pediatric): Secondary | ICD-10-CM | POA: Diagnosis not present

## 2023-12-26 DIAGNOSIS — G43909 Migraine, unspecified, not intractable, without status migrainosus: Secondary | ICD-10-CM | POA: Diagnosis not present

## 2023-12-26 DIAGNOSIS — M25512 Pain in left shoulder: Secondary | ICD-10-CM | POA: Diagnosis not present

## 2023-12-26 DIAGNOSIS — E66813 Obesity, class 3: Secondary | ICD-10-CM | POA: Diagnosis not present

## 2023-12-26 DIAGNOSIS — I10 Essential (primary) hypertension: Secondary | ICD-10-CM

## 2023-12-26 DIAGNOSIS — Z79899 Other long term (current) drug therapy: Secondary | ICD-10-CM | POA: Diagnosis not present

## 2023-12-26 DIAGNOSIS — G709 Myoneural disorder, unspecified: Secondary | ICD-10-CM | POA: Diagnosis not present

## 2023-12-26 DIAGNOSIS — Z8673 Personal history of transient ischemic attack (TIA), and cerebral infarction without residual deficits: Secondary | ICD-10-CM | POA: Diagnosis not present

## 2023-12-26 DIAGNOSIS — R471 Dysarthria and anarthria: Secondary | ICD-10-CM | POA: Diagnosis not present

## 2023-12-26 DIAGNOSIS — F419 Anxiety disorder, unspecified: Secondary | ICD-10-CM | POA: Diagnosis not present

## 2023-12-26 DIAGNOSIS — Z7951 Long term (current) use of inhaled steroids: Secondary | ICD-10-CM | POA: Diagnosis not present

## 2023-12-26 DIAGNOSIS — J441 Chronic obstructive pulmonary disease with (acute) exacerbation: Secondary | ICD-10-CM

## 2023-12-26 DIAGNOSIS — J309 Allergic rhinitis, unspecified: Secondary | ICD-10-CM | POA: Diagnosis not present

## 2023-12-26 DIAGNOSIS — F32A Depression, unspecified: Secondary | ICD-10-CM | POA: Diagnosis not present

## 2023-12-26 DIAGNOSIS — J4489 Other specified chronic obstructive pulmonary disease: Secondary | ICD-10-CM | POA: Diagnosis not present

## 2023-12-26 DIAGNOSIS — M5030 Other cervical disc degeneration, unspecified cervical region: Secondary | ICD-10-CM | POA: Diagnosis not present

## 2023-12-26 DIAGNOSIS — M797 Fibromyalgia: Secondary | ICD-10-CM | POA: Diagnosis not present

## 2023-12-26 DIAGNOSIS — N200 Calculus of kidney: Secondary | ICD-10-CM

## 2023-12-26 DIAGNOSIS — G9332 Myalgic encephalomyelitis/chronic fatigue syndrome: Secondary | ICD-10-CM | POA: Diagnosis not present

## 2023-12-26 DIAGNOSIS — G8929 Other chronic pain: Secondary | ICD-10-CM | POA: Diagnosis not present

## 2023-12-26 DIAGNOSIS — M1990 Unspecified osteoarthritis, unspecified site: Secondary | ICD-10-CM | POA: Diagnosis not present

## 2023-12-26 NOTE — Telephone Encounter (Signed)
 Copied from CRM [id number]. Topic: Clinical - Medical Advice >> Dec 26, 2023 12:46 PM Mercer PEDLAR wrote: Reason for CRM: Patient calling to speak with [facility], she called during lunch but would like a callback as soon as possible she said she has a question for her.  Patient also wanted to leave a note that urine sample was contaminated when it was done via home health and will need to be re-done and they need an order for it. She stated that the home health nurse is coming today 12/26/23 at 1pm for collection of sample.

## 2023-12-26 NOTE — Telephone Encounter (Signed)
 I called pt who stated she needs to talk to [facility] (not [place]). Stated she needs a new order to repeat urine; stated previous one was contaminated. Pt has canceled her appt today and re-schedule for next week. Pt stated the [facility]) should be coming to see her today and repeat urine can be done; I asked to have HHN to call the office if an order is needed.

## 2023-12-26 NOTE — Telephone Encounter (Signed)
 I have not received a call from [facility] at this time.

## 2023-12-27 DIAGNOSIS — J4489 Other specified chronic obstructive pulmonary disease: Secondary | ICD-10-CM | POA: Diagnosis not present

## 2023-12-27 DIAGNOSIS — G9332 Myalgic encephalomyelitis/chronic fatigue syndrome: Secondary | ICD-10-CM | POA: Diagnosis not present

## 2023-12-27 DIAGNOSIS — J309 Allergic rhinitis, unspecified: Secondary | ICD-10-CM | POA: Diagnosis not present

## 2023-12-27 DIAGNOSIS — F32A Depression, unspecified: Secondary | ICD-10-CM | POA: Diagnosis not present

## 2023-12-27 DIAGNOSIS — R471 Dysarthria and anarthria: Secondary | ICD-10-CM | POA: Diagnosis not present

## 2023-12-27 DIAGNOSIS — G43909 Migraine, unspecified, not intractable, without status migrainosus: Secondary | ICD-10-CM | POA: Diagnosis not present

## 2023-12-27 DIAGNOSIS — G709 Myoneural disorder, unspecified: Secondary | ICD-10-CM | POA: Diagnosis not present

## 2023-12-27 DIAGNOSIS — E66813 Obesity, class 3: Secondary | ICD-10-CM | POA: Diagnosis not present

## 2023-12-27 DIAGNOSIS — Z79899 Other long term (current) drug therapy: Secondary | ICD-10-CM | POA: Diagnosis not present

## 2023-12-27 DIAGNOSIS — M1990 Unspecified osteoarthritis, unspecified site: Secondary | ICD-10-CM | POA: Diagnosis not present

## 2023-12-27 DIAGNOSIS — G8929 Other chronic pain: Secondary | ICD-10-CM | POA: Diagnosis not present

## 2023-12-27 DIAGNOSIS — N200 Calculus of kidney: Secondary | ICD-10-CM | POA: Diagnosis not present

## 2023-12-27 DIAGNOSIS — Z7951 Long term (current) use of inhaled steroids: Secondary | ICD-10-CM | POA: Diagnosis not present

## 2023-12-27 DIAGNOSIS — E039 Hypothyroidism, unspecified: Secondary | ICD-10-CM | POA: Diagnosis not present

## 2023-12-27 DIAGNOSIS — E119 Type 2 diabetes mellitus without complications: Secondary | ICD-10-CM | POA: Diagnosis not present

## 2023-12-27 DIAGNOSIS — Z5982 Transportation insecurity: Secondary | ICD-10-CM | POA: Diagnosis not present

## 2023-12-27 DIAGNOSIS — Z791 Long term (current) use of non-steroidal anti-inflammatories (NSAID): Secondary | ICD-10-CM | POA: Diagnosis not present

## 2023-12-27 DIAGNOSIS — I10 Essential (primary) hypertension: Secondary | ICD-10-CM | POA: Diagnosis not present

## 2023-12-27 DIAGNOSIS — M797 Fibromyalgia: Secondary | ICD-10-CM | POA: Diagnosis not present

## 2023-12-27 DIAGNOSIS — Z8673 Personal history of transient ischemic attack (TIA), and cerebral infarction without residual deficits: Secondary | ICD-10-CM | POA: Diagnosis not present

## 2023-12-27 DIAGNOSIS — F419 Anxiety disorder, unspecified: Secondary | ICD-10-CM | POA: Diagnosis not present

## 2023-12-27 DIAGNOSIS — M25512 Pain in left shoulder: Secondary | ICD-10-CM | POA: Diagnosis not present

## 2023-12-27 DIAGNOSIS — Z87440 Personal history of urinary (tract) infections: Secondary | ICD-10-CM | POA: Diagnosis not present

## 2023-12-27 DIAGNOSIS — G4733 Obstructive sleep apnea (adult) (pediatric): Secondary | ICD-10-CM | POA: Diagnosis not present

## 2023-12-27 DIAGNOSIS — M5030 Other cervical disc degeneration, unspecified cervical region: Secondary | ICD-10-CM | POA: Diagnosis not present

## 2023-12-28 DIAGNOSIS — F341 Dysthymic disorder: Secondary | ICD-10-CM | POA: Diagnosis not present

## 2023-12-28 DIAGNOSIS — F411 Generalized anxiety disorder: Secondary | ICD-10-CM | POA: Diagnosis not present

## 2023-12-29 DIAGNOSIS — Z9181 History of falling: Secondary | ICD-10-CM | POA: Diagnosis not present

## 2023-12-29 DIAGNOSIS — G8929 Other chronic pain: Secondary | ICD-10-CM | POA: Diagnosis not present

## 2023-12-29 DIAGNOSIS — M62838 Other muscle spasm: Secondary | ICD-10-CM | POA: Diagnosis not present

## 2023-12-29 DIAGNOSIS — Z8673 Personal history of transient ischemic attack (TIA), and cerebral infarction without residual deficits: Secondary | ICD-10-CM | POA: Diagnosis not present

## 2023-12-29 DIAGNOSIS — M5416 Radiculopathy, lumbar region: Secondary | ICD-10-CM | POA: Diagnosis not present

## 2023-12-29 DIAGNOSIS — E119 Type 2 diabetes mellitus without complications: Secondary | ICD-10-CM | POA: Diagnosis not present

## 2023-12-29 DIAGNOSIS — G4730 Sleep apnea, unspecified: Secondary | ICD-10-CM | POA: Diagnosis not present

## 2023-12-29 DIAGNOSIS — R413 Other amnesia: Secondary | ICD-10-CM | POA: Diagnosis not present

## 2023-12-29 DIAGNOSIS — M1711 Unilateral primary osteoarthritis, right knee: Secondary | ICD-10-CM | POA: Diagnosis not present

## 2023-12-29 DIAGNOSIS — Z79899 Other long term (current) drug therapy: Secondary | ICD-10-CM | POA: Diagnosis not present

## 2023-12-29 DIAGNOSIS — I509 Heart failure, unspecified: Secondary | ICD-10-CM | POA: Diagnosis not present

## 2023-12-30 ENCOUNTER — Telehealth: Payer: Self-pay | Admitting: *Deleted

## 2023-12-30 DIAGNOSIS — F411 Generalized anxiety disorder: Secondary | ICD-10-CM | POA: Diagnosis not present

## 2023-12-30 DIAGNOSIS — F341 Dysthymic disorder: Secondary | ICD-10-CM | POA: Diagnosis not present

## 2023-12-30 NOTE — Telephone Encounter (Signed)
 Will resend request for order to do urine. Copied from CRM [id number]. Topic: General - Other >> Dec 30, 2023 12:44 PM Susanna ORN wrote: Reason for CRM: Caitlyn, with [facility], calling to verify if the orders for patient has been received. States she faxed them on 12/26/23. Checked chart & did not see where the orders were received. She stated she would resend them via fax.

## 2023-12-31 ENCOUNTER — Encounter

## 2024-01-01 DIAGNOSIS — G43909 Migraine, unspecified, not intractable, without status migrainosus: Secondary | ICD-10-CM | POA: Diagnosis not present

## 2024-01-01 DIAGNOSIS — G8929 Other chronic pain: Secondary | ICD-10-CM | POA: Diagnosis not present

## 2024-01-01 DIAGNOSIS — M797 Fibromyalgia: Secondary | ICD-10-CM | POA: Diagnosis not present

## 2024-01-01 DIAGNOSIS — I10 Essential (primary) hypertension: Secondary | ICD-10-CM | POA: Diagnosis not present

## 2024-01-01 DIAGNOSIS — E119 Type 2 diabetes mellitus without complications: Secondary | ICD-10-CM | POA: Diagnosis not present

## 2024-01-01 DIAGNOSIS — F419 Anxiety disorder, unspecified: Secondary | ICD-10-CM | POA: Diagnosis not present

## 2024-01-01 DIAGNOSIS — J4489 Other specified chronic obstructive pulmonary disease: Secondary | ICD-10-CM | POA: Diagnosis not present

## 2024-01-01 DIAGNOSIS — Z8673 Personal history of transient ischemic attack (TIA), and cerebral infarction without residual deficits: Secondary | ICD-10-CM | POA: Diagnosis not present

## 2024-01-01 DIAGNOSIS — G709 Myoneural disorder, unspecified: Secondary | ICD-10-CM | POA: Diagnosis not present

## 2024-01-01 DIAGNOSIS — Z791 Long term (current) use of non-steroidal anti-inflammatories (NSAID): Secondary | ICD-10-CM | POA: Diagnosis not present

## 2024-01-01 DIAGNOSIS — Z87440 Personal history of urinary (tract) infections: Secondary | ICD-10-CM | POA: Diagnosis not present

## 2024-01-01 DIAGNOSIS — M25512 Pain in left shoulder: Secondary | ICD-10-CM | POA: Diagnosis not present

## 2024-01-01 DIAGNOSIS — E039 Hypothyroidism, unspecified: Secondary | ICD-10-CM | POA: Diagnosis not present

## 2024-01-01 DIAGNOSIS — R471 Dysarthria and anarthria: Secondary | ICD-10-CM | POA: Diagnosis not present

## 2024-01-01 DIAGNOSIS — Z79899 Other long term (current) drug therapy: Secondary | ICD-10-CM | POA: Diagnosis not present

## 2024-01-01 DIAGNOSIS — G9332 Myalgic encephalomyelitis/chronic fatigue syndrome: Secondary | ICD-10-CM | POA: Diagnosis not present

## 2024-01-01 DIAGNOSIS — E66813 Obesity, class 3: Secondary | ICD-10-CM | POA: Diagnosis not present

## 2024-01-01 DIAGNOSIS — M1990 Unspecified osteoarthritis, unspecified site: Secondary | ICD-10-CM | POA: Diagnosis not present

## 2024-01-01 DIAGNOSIS — M5030 Other cervical disc degeneration, unspecified cervical region: Secondary | ICD-10-CM | POA: Diagnosis not present

## 2024-01-01 DIAGNOSIS — G4733 Obstructive sleep apnea (adult) (pediatric): Secondary | ICD-10-CM | POA: Diagnosis not present

## 2024-01-01 DIAGNOSIS — J309 Allergic rhinitis, unspecified: Secondary | ICD-10-CM | POA: Diagnosis not present

## 2024-01-01 DIAGNOSIS — F32A Depression, unspecified: Secondary | ICD-10-CM | POA: Diagnosis not present

## 2024-01-01 DIAGNOSIS — Z5982 Transportation insecurity: Secondary | ICD-10-CM | POA: Diagnosis not present

## 2024-01-01 DIAGNOSIS — N200 Calculus of kidney: Secondary | ICD-10-CM | POA: Diagnosis not present

## 2024-01-01 DIAGNOSIS — Z7951 Long term (current) use of inhaled steroids: Secondary | ICD-10-CM | POA: Diagnosis not present

## 2024-01-02 ENCOUNTER — Encounter: Payer: Self-pay | Admitting: Student

## 2024-01-02 DIAGNOSIS — F411 Generalized anxiety disorder: Secondary | ICD-10-CM | POA: Diagnosis not present

## 2024-01-02 DIAGNOSIS — F341 Dysthymic disorder: Secondary | ICD-10-CM | POA: Diagnosis not present

## 2024-01-06 ENCOUNTER — Other Ambulatory Visit: Payer: Self-pay

## 2024-01-06 ENCOUNTER — Emergency Department (HOSPITAL_BASED_OUTPATIENT_CLINIC_OR_DEPARTMENT_OTHER)

## 2024-01-06 ENCOUNTER — Emergency Department (HOSPITAL_BASED_OUTPATIENT_CLINIC_OR_DEPARTMENT_OTHER)
Admission: EM | Admit: 2024-01-06 | Discharge: 2024-01-06 | Disposition: A | Discharge status: Home / Self Care | Attending: Emergency Medicine | Admitting: Emergency Medicine

## 2024-01-06 ENCOUNTER — Telehealth: Payer: Self-pay | Admitting: *Deleted

## 2024-01-06 DIAGNOSIS — R110 Nausea: Secondary | ICD-10-CM | POA: Diagnosis not present

## 2024-01-06 DIAGNOSIS — I10 Essential (primary) hypertension: Secondary | ICD-10-CM | POA: Diagnosis not present

## 2024-01-06 DIAGNOSIS — Z79899 Other long term (current) drug therapy: Secondary | ICD-10-CM | POA: Insufficient documentation

## 2024-01-06 DIAGNOSIS — R109 Unspecified abdominal pain: Secondary | ICD-10-CM | POA: Insufficient documentation

## 2024-01-06 DIAGNOSIS — Z5900 Homelessness unspecified: Secondary | ICD-10-CM | POA: Diagnosis not present

## 2024-01-06 DIAGNOSIS — J45909 Unspecified asthma, uncomplicated: Secondary | ICD-10-CM | POA: Diagnosis not present

## 2024-01-06 DIAGNOSIS — E039 Hypothyroidism, unspecified: Secondary | ICD-10-CM | POA: Insufficient documentation

## 2024-01-06 DIAGNOSIS — Z21 Asymptomatic human immunodeficiency virus [HIV] infection status: Secondary | ICD-10-CM | POA: Diagnosis not present

## 2024-01-06 DIAGNOSIS — R1031 Right lower quadrant pain: Secondary | ICD-10-CM | POA: Diagnosis not present

## 2024-01-06 DIAGNOSIS — N289 Disorder of kidney and ureter, unspecified: Secondary | ICD-10-CM | POA: Diagnosis not present

## 2024-01-06 DIAGNOSIS — Z9049 Acquired absence of other specified parts of digestive tract: Secondary | ICD-10-CM | POA: Diagnosis not present

## 2024-01-06 LAB — COMPREHENSIVE METABOLIC PANEL WITH GFR
ALT: 19 U/L (ref 0–44)
AST: 18 U/L (ref 15–41)
Albumin: 4.3 g/dL (ref 3.5–5.0)
Alkaline Phosphatase: 78 U/L (ref 38–126)
Anion gap: 10 (ref 5–15)
BUN: 8 mg/dL (ref 6–20)
CO2: 27 mmol/L (ref 22–32)
Calcium: 9.8 mg/dL (ref 8.9–10.3)
Chloride: 104 mmol/L (ref 98–111)
Creatinine, Ser: 0.96 mg/dL (ref 0.44–1.00)
GFR, Estimated: >60 mL/min (ref >60)
Glucose, Bld: 93 mg/dL (ref 70–99)
Potassium: 4 mmol/L (ref 3.5–5.1)
Sodium: 141 mmol/L (ref 135–145)
Total Bilirubin: 0.5 mg/dL (ref 0.0–1.2)
Total Protein: 7.4 g/dL (ref 6.5–8.1)

## 2024-01-06 LAB — CBC
HCT: 42.8 % (ref 36.0–46.0)
Hemoglobin: 13.5 g/dL (ref 12.0–15.0)
MCH: 27.1 pg (ref 26.0–34.0)
MCHC: 31.5 g/dL (ref 30.0–36.0)
MCV: 85.9 fL (ref 80.0–100.0)
Platelets: 335 K/uL (ref 150–400)
RBC: 4.98 MIL/uL (ref 3.87–5.11)
RDW: 14 % (ref 11.5–15.5)
WBC: 9.3 K/uL (ref 4.0–10.5)
nRBC: 0 % (ref 0.0–0.2)

## 2024-01-06 LAB — URINALYSIS, ROUTINE W REFLEX MICROSCOPIC
Bilirubin Urine: NEGATIVE
Glucose, UA: NEGATIVE mg/dL
Hgb urine dipstick: NEGATIVE
Ketones, ur: NEGATIVE mg/dL
Leukocytes,Ua: NEGATIVE
Nitrite: NEGATIVE
Specific Gravity, Urine: 1.018 (ref 1.005–1.030)
pH: 5 (ref 5.0–8.0)

## 2024-01-06 MED ORDER — MORPHINE SULFATE (PF) 4 MG/ML IV SOLN
4.0000 mg | Freq: Once | INTRAVENOUS | Status: AC
  Administered 2024-01-06: 4 mg via INTRAVENOUS
  Filled 2024-01-06: qty 1

## 2024-01-06 MED ORDER — MORPHINE SULFATE (PF) 2 MG/ML IV SOLN
2.0000 mg | Freq: Once | INTRAVENOUS | Status: AC
  Administered 2024-01-06: 2 mg via INTRAVENOUS
  Filled 2024-01-06: qty 1

## 2024-01-06 MED ORDER — ONDANSETRON HCL 4 MG/2ML IJ SOLN
4.0000 mg | Freq: Once | INTRAMUSCULAR | Status: AC
  Administered 2024-01-06: 4 mg via INTRAVENOUS
  Filled 2024-01-06: qty 2

## 2024-01-06 MED ORDER — PANTOPRAZOLE SODIUM 40 MG IV SOLR
40.0000 mg | Freq: Once | INTRAVENOUS | Status: AC
  Administered 2024-01-06: 40 mg via INTRAVENOUS
  Filled 2024-01-06: qty 10

## 2024-01-06 NOTE — Telephone Encounter (Signed)
 Copied from CRM [id number]. Topic: General - Other >> Jan 06, 2024  1:33 PM Diannia H wrote: Reason for CRM: Edmonia was calling from [facility] wanted to know did May receive her fax? Order number is [id number], if you did or didn't receive it could you call her and let her know and if she does not answer, you can leave a message, callback number is [phone].

## 2024-01-06 NOTE — ED Triage Notes (Addendum)
 Patient to ED reporting right sided flank pain wrapping around to right upper abd and worsening over the weekend. Patient has hx of kidney stones and chronic pain but reports this is worse and not responding to prescribed oxycodone . Blood noted in underwear today and patient unsure if it is from urine.

## 2024-01-06 NOTE — ED Notes (Addendum)
 PT stated mutiple complaints after holding breath for CT scan. PT placed on ECG monitor as a precaution. Charge RN informed, Assigned nurse not at nurses station at this time.

## 2024-01-06 NOTE — Telephone Encounter (Signed)
 No longer a pt at [facility] with [facility] called; left message on her vm.

## 2024-01-06 NOTE — ED Notes (Signed)
 Pt complaining of worsening leg swelling. Rn notified

## 2024-01-06 NOTE — ED Provider Notes (Signed)
 This is a 58 year old female presenting emergency department for right sided pain.  Seen primarily by morning team.  See their note for full HPI.  Vital signs and labs reviewed.  Reassuring.  No fever or tachycardia to suggest systemic infection.  Maintaining oxygen saturation on room air not in respiratory distress.  Hemodynamically stable.  Has a benign abdominal exam.  Labs without leukocytosis fever or tachycardia to suggest systemic infection.  No anemia.  Comprehensive panel without abnormality.  No transaminitis to suggest PAD biliary disease.  BUN and creatinine normal.  Normal kidney function.  UA not indicative of urinary tract infection.  CT abdomen had some incidental findings with questionable right renal mass.  Patient is feeling improved after pain medications.  Did have ultrasound ordered of right upper quadrant, however not having significant tenderness on my xam.  Normal LFTs.  Had prior cholecystectomy and CT abdomen with no biliary dilation.  Low suspicion for hepatobiliary disease.  Ultrasound canceled.  I discussed follow-up with primary doctor and urology.  She has an appointment with urology in close to a month.  I also discussed following up with her pain management doctor for adjustment of her pain medications as she does have some chronic pain issues.  Given normal vitals, labs and imaging that she is safe for discharge at this time.    Neysa Caron PARAS, DO 01/06/24 1653

## 2024-01-06 NOTE — Discharge Instructions (Signed)
 As discussed, your vital signs, physical exam and lab work and CT scan were all reassuring.  As such we feel that you are safe for discharge at this home as you do not have any life-threatening or emergent causes for your symptoms.  We would like you to follow-up with your primary doctor.  Your CT scan did show some renal masses that need further imaging.  Please follow-up with your urologist and primary doctor to arrange for MRI.  You may also see your pain management doctor for adjustments to your pain medications.  Please return if develop any new or worsening symptoms that are concerning to you.

## 2024-01-06 NOTE — ED Provider Notes (Signed)
 [place] EMERGENCY DEPARTMENT AT [facility] Provider Note   CSN: [id number] Arrival date & time: 01/06/24  1113     History {Add pertinent medical, surgical, social history, OB history to HPI:1} Chief Complaint  Patient presents with   Flank Pain    Brianna Roberts is a 58 y.o. female with PMH as listed below who presents with multiple complaints. .  Presents with right sided flank pain wrapping around to right upper abd and worsening over the weekend. Patient has hx of kidney stones and chronic pain but reports this is worse and not responding to prescribed oxycodone . Blood noted in underwear today, patient states she is sure it is from either her urethra or her vagina and not her rectum.  She denies any fevers or chills. States this abdominal pain has been going on for indeterminate amount of time but now worsening over this weekend. Also suffers from chronic fibromyalgia and her home pain medications aren't helping this abd pain. +nausea with no vomiting.  Past Medical History:  Diagnosis Date   Allergic rhinitis 10/05/2016   Perennial.  To everything.  Has seen allergists and had testing, which indicated that her only allergy is to mouse hair.     Allergy    Anxiety    Arthritis    Asthma    Asthma, chronic 10/05/2016   Reports annual bronchial infection requiring hospitalization.     Bilateral leg edema 10/05/2016   Work-up did not identify cause.        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 Sharp pain in bilateral frontal and temple area of the head. Pain is constant and waxes and wanes. 7/10 to 10/10. +photophobia. Intermittent left eye vision loss.    History:  MRI Of brain was unremarkable. Pt still suffers from cognitive issues. She states because of insurance change she has not been able to follow up with her re   Neuromuscular disorder (HCC)    Ovarian cyst 10/05/2016   RIGHT sided.  Causes pain.     Positive HTLV-1 antibody 04/22/2023   Thyroid  disease    TIA (transient ischemic attack)        Home Medications Prior to Admission medications   Medication Sig Start Date End Date Taking? Authorizing Provider  albuterol  (VENTOLIN  HFA) 108 (90 Base) MCG/ACT inhaler Inhale 1-2 puffs into the lungs every 6 (six) hours as needed for wheezing or shortness of breath. 01/23/23   Raford Lenis, MD  amitriptyline  (ELAVIL ) 100 MG tablet Take 1 tablet (100 mg total) by mouth at bedtime. 08/02/23   Masters, Izetta, DO  amLODipine  (NORVASC ) 10 MG tablet Take 1 tablet (10 mg total) by mouth daily. 03/26/23   Jolaine Pac, DO  Blood Glucose Monitoring Suppl (ONETOUCH VERIO) w/Device KIT 1 each by Does not apply route as needed. 03/28/23   Jolaine Pac, DO  diazepam  (VALIUM ) 5 MG tablet Take 1 tablet (5 mg total) by mouth every 8 (eight) hours as needed for anxiety. 09/03/23   Marylu Gee, DO  fluticasone  (FLONASE ) 50 MCG/ACT nasal spray Place 1 spray into both nostrils daily. 03/26/23   Jolaine Pac, DO  Fluticasone -Umeclidin-Vilant (TRELEGY ELLIPTA ) 200-62.5-25 MCG/ACT AEPB Inhale 1 Inhalation into the lungs daily at 6 (six) AM. 08/30/23   Marylu Gee, DO  furosemide  (LASIX ) 40 MG tablet Take 1 tablet (40 mg total) by mouth 2 (two) times daily. 03/26/23   Jolaine Pac, DO  glucose blood test strip Use as instructed to check blood sugar three times daily 04/01/23   Gabino Boga, MD   Lancets Thin MISC Use to check home glucose three times daily 04/01/23   Gabino Boga, MD  levothyroxine  (SYNTHROID ) 50 MCG tablet Take 1 tablet (50 mcg total) by mouth daily before breakfast. 03/06/23   Tobie Gaines, DO  meclizine  (ANTIVERT ) 25 MG tablet Take 1 tablet (25 mg total) by mouth 3 (three) times daily as needed for dizziness. 08/04/23   Ruthell Lonni FALCON, PA-C  meloxicam  (MOBIC ) 7.5 MG tablet TAKE 2 TABLETS BY MOUTH IN THE MORNING AND AT BEDTIME 11/25/23   Marylu Gee, DO  metoprolol  tartrate (LOPRESSOR ) 100 MG tablet Take 1 tablet (100 mg total) by mouth 2 (two) times daily. 03/26/23   Jolaine Pac, DO  Nerve Stimulator (PRO COMFORT TENS UNIT) DEVI 1 Device by Does not apply route daily as needed. 12/27/16   Juliane Che, PA  nystatin  cream (MYCOSTATIN ) Apply 1 Application topically 2 (two) times daily. 03/26/23   Jolaine Pac, DO  ondansetron  (ZOFRAN -ODT) 8 MG disintegrating tablet Take 1 tablet (8 mg total) by mouth every 8 (eight) hours as needed for nausea or vomiting. 01/25/23   Tobie Gaines, DO  potassium chloride  (KLOR-CON  M) 10 MEQ tablet Take 1 tablet (10 mEq total) by mouth 2 (two) times daily. 09/02/23   Marylu Gee, DO  triamcinolone  (KENALOG ) 0.025 % ointment Apply 1 Application topically 2 (two) times daily. 12/10/23   Burnette, Jennifer M, PA-C  Triamcinolone  Acetonide (TRIAMCINOLONE  0.1 % CREAM : EUCERIN) CREA Apply 1 Application topically daily as needed. 09/03/23   Marylu Gee, DO  valACYclovir  (VALTREX ) 1000 MG tablet Take 1 tablet (1,000 mg total) by mouth daily. 03/26/23   Jolaine Pac, DO      Allergies    Hydromorphone, Iodinated contrast media, Sulfa antibiotics, Codeine, Nickel, Penicillins, Spironolactone, Cefdinir, and Adhesive [tape]    Review of Systems   Review of Systems A 10 point review of systems was performed and is negative unless otherwise reported in HPI.  Physical Exam Updated Vital Signs BP 112/84 (BP Location: Left Arm)   Pulse  81   Temp 98.6 F (37 C) (Temporal)   Resp 16   SpO2 99%  Physical Exam General: Normal appearing obese female, lying in bed.  HEENT: PERRLA, Sclera anicteric, MMM, trachea midline.  Cardiology: RRR, no murmurs/rubs/gallops.  Resp: Normal respiratory rate and effort. CTAB, no wheezes, rhonchi, crackles.  Abd: Soft, RUQ tenderness, non-distended. No rebound tenderness or guarding.  GU: Deferred. MSK: No peripheral edema or signs of trauma. Extremities without deformity or TTP. No cyanosis or clubbing. Skin: warm, dry.  Back: +R CVA tenderness Neuro: A&Ox4, CNs II-XII grossly intact. MAEs. Sensation grossly intact.  Psych: Normal mood and affect.   ED Results / Procedures / Treatments   Labs (all labs ordered are listed, but only abnormal results are displayed) Labs Reviewed  URINALYSIS, ROUTINE  W REFLEX MICROSCOPIC - Abnormal; Notable for the following components:      Result Value   Protein, ur TRACE (*)    All other components within normal limits  COMPREHENSIVE METABOLIC PANEL WITH GFR  CBC    EKG None  Radiology No results found.  Procedures Procedures  {Document cardiac monitor, telemetry assessment procedure when appropriate:1}  Medications Ordered in ED Medications  pantoprazole  (PROTONIX ) injection 40 mg (40 mg Intravenous Given 01/06/24 1458)  morphine  (PF) 4 MG/ML injection 4 mg (4 mg Intravenous Given 01/06/24 1500)  ondansetron  (ZOFRAN ) injection 4 mg (4 mg Intravenous Given 01/06/24 1459)  morphine  (PF) 2 MG/ML injection 2 mg (2 mg Intravenous Given 01/06/24 1715)    ED Course/ Medical Decision Making/ A&P                          Medical Decision Making Amount and/or Complexity of Data Reviewed Labs: ordered. Radiology: ordered. Decision-making details documented in ED Course.  Risk Prescription drug management.    This patient presents to the ED for concern of multiple complaints inc R sided abdominal pain, this involves an extensive number of  treatment options, and is a complaint that carries with it a high risk of complications and morbidity.  I considered the following differential and admission for this acute, potentially life threatening condition.   MDM:    For DDX for abdominal pain includes but is not limited to:  Abdominal exam without peritoneal signs. No evidence of acute abdomen at this time. Considered acute hepatobiliary disease (including acute cholecystitis or cholangitis), acute pancreatitis (neg lipase). CT with no evidence of    Clinical Course as of 01/09/24 2059  Mon Jan 06, 2024  1423 CT Renal Stone Study 1. Two indeterminate, intermediate density lesions in the right kidney. Further evaluation with nonemergent pre and post contrast MRI or CT should be considered. MRI is preferred in younger patients (due to lack of ionizing radiation) and for evaluating calcified lesion(s).  2.  No nephrolithiasis or hydronephrosis.   [HN]    Clinical Course User Index [HN] Franklyn Sid SAILOR, MD    Labs: I Ordered, and personally interpreted labs.  The pertinent results include:  those listed above  Imaging Studies ordered: I ordered imaging studies including CT renal stone I independently visualized and interpreted imaging. I agree with the radiologist interpretation  Additional history obtained from chart review.    Reevaluation: After the interventions noted above, I reevaluated the patient and found that they have :stayed the same  Social Determinants of Health: Lives independently  Disposition:  Patient is signed out to the oncoming ED physician Dr. Neysa who is made aware of her history, presentation, exam, workup, and plan.    Co morbidities that complicate the patient evaluation  Past Medical History:  Diagnosis Date   Allergic rhinitis 10/05/2016   Perennial.  To everything.  Has seen allergists and had testing, which indicated that her only allergy is to mouse hair.     Allergy    Anxiety     Arthritis    Asthma    Asthma, chronic 10/05/2016   Reports annual bronchial infection requiring hospitalization.     Bilateral leg edema 10/05/2016   Work-up did not identify cause.        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 Sharp pain in bilateral frontal and temple area of the head. Pain is constant and waxes and wanes. 7/10 to 10/10. +photophobia. Intermittent left eye vision loss.    History:  MRI Of brain was unremarkable. Pt still suffers from cognitive issues. She states because of insurance change she has not been able to follow up with her re   Neuromuscular disorder (HCC)    Ovarian cyst 10/05/2016   RIGHT sided.  Causes pain.     Positive HTLV-1 antibody 04/22/2023   Thyroid  disease    TIA (transient ischemic attack)      Medicines No orders of the defined types were placed in this encounter.   I have reviewed the patients home medicines and have made adjustments as needed  Problem List / ED Course: Problem List Items Addressed This Visit   None        {Document critical care time when appropriate:1} {Document review of labs and clinical decision tools ie heart score, Chads2Vasc2 etc:1}  {Document your independent review of radiology images, and any outside records:1} {Document your discussion with family members, caretakers, and with consultants:1} {Document social determinants of health  affecting pt's care:1} {Document your decision making why or why not admission, treatments were needed:1}  This note was created using dictation software, which may contain spelling or grammatical errors.

## 2024-01-08 DIAGNOSIS — M5030 Other cervical disc degeneration, unspecified cervical region: Secondary | ICD-10-CM | POA: Diagnosis not present

## 2024-01-08 DIAGNOSIS — J4489 Other specified chronic obstructive pulmonary disease: Secondary | ICD-10-CM | POA: Diagnosis not present

## 2024-01-08 DIAGNOSIS — G4733 Obstructive sleep apnea (adult) (pediatric): Secondary | ICD-10-CM | POA: Diagnosis not present

## 2024-01-08 DIAGNOSIS — G43909 Migraine, unspecified, not intractable, without status migrainosus: Secondary | ICD-10-CM | POA: Diagnosis not present

## 2024-01-08 DIAGNOSIS — G9332 Myalgic encephalomyelitis/chronic fatigue syndrome: Secondary | ICD-10-CM | POA: Diagnosis not present

## 2024-01-08 DIAGNOSIS — G709 Myoneural disorder, unspecified: Secondary | ICD-10-CM | POA: Diagnosis not present

## 2024-01-08 DIAGNOSIS — M797 Fibromyalgia: Secondary | ICD-10-CM | POA: Diagnosis not present

## 2024-01-08 DIAGNOSIS — Z87440 Personal history of urinary (tract) infections: Secondary | ICD-10-CM | POA: Diagnosis not present

## 2024-01-08 DIAGNOSIS — M25512 Pain in left shoulder: Secondary | ICD-10-CM | POA: Diagnosis not present

## 2024-01-08 DIAGNOSIS — M1990 Unspecified osteoarthritis, unspecified site: Secondary | ICD-10-CM | POA: Diagnosis not present

## 2024-01-08 DIAGNOSIS — Z7951 Long term (current) use of inhaled steroids: Secondary | ICD-10-CM | POA: Diagnosis not present

## 2024-01-08 DIAGNOSIS — Z5982 Transportation insecurity: Secondary | ICD-10-CM | POA: Diagnosis not present

## 2024-01-08 DIAGNOSIS — E039 Hypothyroidism, unspecified: Secondary | ICD-10-CM | POA: Diagnosis not present

## 2024-01-08 DIAGNOSIS — E66813 Obesity, class 3: Secondary | ICD-10-CM | POA: Diagnosis not present

## 2024-01-08 DIAGNOSIS — F32A Depression, unspecified: Secondary | ICD-10-CM | POA: Diagnosis not present

## 2024-01-08 DIAGNOSIS — J309 Allergic rhinitis, unspecified: Secondary | ICD-10-CM | POA: Diagnosis not present

## 2024-01-08 DIAGNOSIS — E119 Type 2 diabetes mellitus without complications: Secondary | ICD-10-CM | POA: Diagnosis not present

## 2024-01-08 DIAGNOSIS — F419 Anxiety disorder, unspecified: Secondary | ICD-10-CM | POA: Diagnosis not present

## 2024-01-08 DIAGNOSIS — I10 Essential (primary) hypertension: Secondary | ICD-10-CM | POA: Diagnosis not present

## 2024-01-08 DIAGNOSIS — Z791 Long term (current) use of non-steroidal anti-inflammatories (NSAID): Secondary | ICD-10-CM | POA: Diagnosis not present

## 2024-01-08 DIAGNOSIS — Z8673 Personal history of transient ischemic attack (TIA), and cerebral infarction without residual deficits: Secondary | ICD-10-CM | POA: Diagnosis not present

## 2024-01-08 DIAGNOSIS — N200 Calculus of kidney: Secondary | ICD-10-CM | POA: Diagnosis not present

## 2024-01-08 DIAGNOSIS — R471 Dysarthria and anarthria: Secondary | ICD-10-CM | POA: Diagnosis not present

## 2024-01-08 DIAGNOSIS — G8929 Other chronic pain: Secondary | ICD-10-CM | POA: Diagnosis not present

## 2024-01-08 DIAGNOSIS — Z79899 Other long term (current) drug therapy: Secondary | ICD-10-CM | POA: Diagnosis not present

## 2024-01-09 DIAGNOSIS — F411 Generalized anxiety disorder: Secondary | ICD-10-CM | POA: Diagnosis not present

## 2024-01-09 DIAGNOSIS — F32A Depression, unspecified: Secondary | ICD-10-CM | POA: Diagnosis not present

## 2024-01-09 DIAGNOSIS — Z7951 Long term (current) use of inhaled steroids: Secondary | ICD-10-CM | POA: Diagnosis not present

## 2024-01-09 DIAGNOSIS — Z87440 Personal history of urinary (tract) infections: Secondary | ICD-10-CM | POA: Diagnosis not present

## 2024-01-09 DIAGNOSIS — G709 Myoneural disorder, unspecified: Secondary | ICD-10-CM | POA: Diagnosis not present

## 2024-01-09 DIAGNOSIS — E66813 Obesity, class 3: Secondary | ICD-10-CM | POA: Diagnosis not present

## 2024-01-09 DIAGNOSIS — N200 Calculus of kidney: Secondary | ICD-10-CM | POA: Diagnosis not present

## 2024-01-09 DIAGNOSIS — Z8673 Personal history of transient ischemic attack (TIA), and cerebral infarction without residual deficits: Secondary | ICD-10-CM | POA: Diagnosis not present

## 2024-01-09 DIAGNOSIS — G8929 Other chronic pain: Secondary | ICD-10-CM | POA: Diagnosis not present

## 2024-01-09 DIAGNOSIS — M5030 Other cervical disc degeneration, unspecified cervical region: Secondary | ICD-10-CM | POA: Diagnosis not present

## 2024-01-09 DIAGNOSIS — M1990 Unspecified osteoarthritis, unspecified site: Secondary | ICD-10-CM | POA: Diagnosis not present

## 2024-01-09 DIAGNOSIS — J4489 Other specified chronic obstructive pulmonary disease: Secondary | ICD-10-CM | POA: Diagnosis not present

## 2024-01-09 DIAGNOSIS — F419 Anxiety disorder, unspecified: Secondary | ICD-10-CM | POA: Diagnosis not present

## 2024-01-09 DIAGNOSIS — G4733 Obstructive sleep apnea (adult) (pediatric): Secondary | ICD-10-CM | POA: Diagnosis not present

## 2024-01-09 DIAGNOSIS — M25512 Pain in left shoulder: Secondary | ICD-10-CM | POA: Diagnosis not present

## 2024-01-09 DIAGNOSIS — Z79899 Other long term (current) drug therapy: Secondary | ICD-10-CM | POA: Diagnosis not present

## 2024-01-09 DIAGNOSIS — R471 Dysarthria and anarthria: Secondary | ICD-10-CM | POA: Diagnosis not present

## 2024-01-09 DIAGNOSIS — M797 Fibromyalgia: Secondary | ICD-10-CM | POA: Diagnosis not present

## 2024-01-09 DIAGNOSIS — Z791 Long term (current) use of non-steroidal anti-inflammatories (NSAID): Secondary | ICD-10-CM | POA: Diagnosis not present

## 2024-01-09 DIAGNOSIS — E039 Hypothyroidism, unspecified: Secondary | ICD-10-CM | POA: Diagnosis not present

## 2024-01-09 DIAGNOSIS — I10 Essential (primary) hypertension: Secondary | ICD-10-CM | POA: Diagnosis not present

## 2024-01-09 DIAGNOSIS — G9332 Myalgic encephalomyelitis/chronic fatigue syndrome: Secondary | ICD-10-CM | POA: Diagnosis not present

## 2024-01-09 DIAGNOSIS — G43909 Migraine, unspecified, not intractable, without status migrainosus: Secondary | ICD-10-CM | POA: Diagnosis not present

## 2024-01-09 DIAGNOSIS — F341 Dysthymic disorder: Secondary | ICD-10-CM | POA: Diagnosis not present

## 2024-01-09 DIAGNOSIS — J309 Allergic rhinitis, unspecified: Secondary | ICD-10-CM | POA: Diagnosis not present

## 2024-01-09 DIAGNOSIS — E119 Type 2 diabetes mellitus without complications: Secondary | ICD-10-CM | POA: Diagnosis not present

## 2024-01-09 DIAGNOSIS — Z5982 Transportation insecurity: Secondary | ICD-10-CM | POA: Diagnosis not present

## 2024-01-10 DIAGNOSIS — Z8673 Personal history of transient ischemic attack (TIA), and cerebral infarction without residual deficits: Secondary | ICD-10-CM | POA: Diagnosis not present

## 2024-01-10 DIAGNOSIS — Z79899 Other long term (current) drug therapy: Secondary | ICD-10-CM | POA: Diagnosis not present

## 2024-01-10 DIAGNOSIS — M1990 Unspecified osteoarthritis, unspecified site: Secondary | ICD-10-CM | POA: Diagnosis not present

## 2024-01-10 DIAGNOSIS — E119 Type 2 diabetes mellitus without complications: Secondary | ICD-10-CM | POA: Diagnosis not present

## 2024-01-10 DIAGNOSIS — F419 Anxiety disorder, unspecified: Secondary | ICD-10-CM | POA: Diagnosis not present

## 2024-01-10 DIAGNOSIS — E66813 Obesity, class 3: Secondary | ICD-10-CM | POA: Diagnosis not present

## 2024-01-10 DIAGNOSIS — F32A Depression, unspecified: Secondary | ICD-10-CM | POA: Diagnosis not present

## 2024-01-10 DIAGNOSIS — Z791 Long term (current) use of non-steroidal anti-inflammatories (NSAID): Secondary | ICD-10-CM | POA: Diagnosis not present

## 2024-01-10 DIAGNOSIS — G8929 Other chronic pain: Secondary | ICD-10-CM | POA: Diagnosis not present

## 2024-01-10 DIAGNOSIS — J4489 Other specified chronic obstructive pulmonary disease: Secondary | ICD-10-CM | POA: Diagnosis not present

## 2024-01-10 DIAGNOSIS — M5030 Other cervical disc degeneration, unspecified cervical region: Secondary | ICD-10-CM | POA: Diagnosis not present

## 2024-01-10 DIAGNOSIS — R471 Dysarthria and anarthria: Secondary | ICD-10-CM | POA: Diagnosis not present

## 2024-01-10 DIAGNOSIS — Z87440 Personal history of urinary (tract) infections: Secondary | ICD-10-CM | POA: Diagnosis not present

## 2024-01-10 DIAGNOSIS — J309 Allergic rhinitis, unspecified: Secondary | ICD-10-CM | POA: Diagnosis not present

## 2024-01-10 DIAGNOSIS — G43909 Migraine, unspecified, not intractable, without status migrainosus: Secondary | ICD-10-CM | POA: Diagnosis not present

## 2024-01-10 DIAGNOSIS — G9332 Myalgic encephalomyelitis/chronic fatigue syndrome: Secondary | ICD-10-CM | POA: Diagnosis not present

## 2024-01-10 DIAGNOSIS — M25512 Pain in left shoulder: Secondary | ICD-10-CM | POA: Diagnosis not present

## 2024-01-10 DIAGNOSIS — M797 Fibromyalgia: Secondary | ICD-10-CM | POA: Diagnosis not present

## 2024-01-10 DIAGNOSIS — G4733 Obstructive sleep apnea (adult) (pediatric): Secondary | ICD-10-CM | POA: Diagnosis not present

## 2024-01-10 DIAGNOSIS — G709 Myoneural disorder, unspecified: Secondary | ICD-10-CM | POA: Diagnosis not present

## 2024-01-10 DIAGNOSIS — E039 Hypothyroidism, unspecified: Secondary | ICD-10-CM | POA: Diagnosis not present

## 2024-01-10 DIAGNOSIS — Z5982 Transportation insecurity: Secondary | ICD-10-CM | POA: Diagnosis not present

## 2024-01-10 DIAGNOSIS — N200 Calculus of kidney: Secondary | ICD-10-CM | POA: Diagnosis not present

## 2024-01-10 DIAGNOSIS — I10 Essential (primary) hypertension: Secondary | ICD-10-CM | POA: Diagnosis not present

## 2024-01-10 DIAGNOSIS — Z7951 Long term (current) use of inhaled steroids: Secondary | ICD-10-CM | POA: Diagnosis not present

## 2024-01-13 DIAGNOSIS — J309 Allergic rhinitis, unspecified: Secondary | ICD-10-CM | POA: Diagnosis not present

## 2024-01-13 DIAGNOSIS — Z79899 Other long term (current) drug therapy: Secondary | ICD-10-CM | POA: Diagnosis not present

## 2024-01-13 DIAGNOSIS — M25512 Pain in left shoulder: Secondary | ICD-10-CM | POA: Diagnosis not present

## 2024-01-13 DIAGNOSIS — G709 Myoneural disorder, unspecified: Secondary | ICD-10-CM | POA: Diagnosis not present

## 2024-01-13 DIAGNOSIS — M797 Fibromyalgia: Secondary | ICD-10-CM | POA: Diagnosis not present

## 2024-01-13 DIAGNOSIS — Z7951 Long term (current) use of inhaled steroids: Secondary | ICD-10-CM | POA: Diagnosis not present

## 2024-01-13 DIAGNOSIS — G8929 Other chronic pain: Secondary | ICD-10-CM | POA: Diagnosis not present

## 2024-01-13 DIAGNOSIS — F32A Depression, unspecified: Secondary | ICD-10-CM | POA: Diagnosis not present

## 2024-01-13 DIAGNOSIS — G4733 Obstructive sleep apnea (adult) (pediatric): Secondary | ICD-10-CM | POA: Diagnosis not present

## 2024-01-13 DIAGNOSIS — Z8673 Personal history of transient ischemic attack (TIA), and cerebral infarction without residual deficits: Secondary | ICD-10-CM | POA: Diagnosis not present

## 2024-01-13 DIAGNOSIS — R471 Dysarthria and anarthria: Secondary | ICD-10-CM | POA: Diagnosis not present

## 2024-01-13 DIAGNOSIS — E039 Hypothyroidism, unspecified: Secondary | ICD-10-CM | POA: Diagnosis not present

## 2024-01-13 DIAGNOSIS — M5030 Other cervical disc degeneration, unspecified cervical region: Secondary | ICD-10-CM | POA: Diagnosis not present

## 2024-01-13 DIAGNOSIS — J4489 Other specified chronic obstructive pulmonary disease: Secondary | ICD-10-CM | POA: Diagnosis not present

## 2024-01-13 DIAGNOSIS — M1990 Unspecified osteoarthritis, unspecified site: Secondary | ICD-10-CM | POA: Diagnosis not present

## 2024-01-13 DIAGNOSIS — I10 Essential (primary) hypertension: Secondary | ICD-10-CM | POA: Diagnosis not present

## 2024-01-13 DIAGNOSIS — G9332 Myalgic encephalomyelitis/chronic fatigue syndrome: Secondary | ICD-10-CM | POA: Diagnosis not present

## 2024-01-13 DIAGNOSIS — G43909 Migraine, unspecified, not intractable, without status migrainosus: Secondary | ICD-10-CM | POA: Diagnosis not present

## 2024-01-13 DIAGNOSIS — E66813 Obesity, class 3: Secondary | ICD-10-CM | POA: Diagnosis not present

## 2024-01-13 DIAGNOSIS — F419 Anxiety disorder, unspecified: Secondary | ICD-10-CM | POA: Diagnosis not present

## 2024-01-13 DIAGNOSIS — E119 Type 2 diabetes mellitus without complications: Secondary | ICD-10-CM | POA: Diagnosis not present

## 2024-01-13 DIAGNOSIS — Z791 Long term (current) use of non-steroidal anti-inflammatories (NSAID): Secondary | ICD-10-CM | POA: Diagnosis not present

## 2024-01-13 DIAGNOSIS — Z5982 Transportation insecurity: Secondary | ICD-10-CM | POA: Diagnosis not present

## 2024-01-13 DIAGNOSIS — F341 Dysthymic disorder: Secondary | ICD-10-CM | POA: Diagnosis not present

## 2024-01-13 DIAGNOSIS — Z87440 Personal history of urinary (tract) infections: Secondary | ICD-10-CM | POA: Diagnosis not present

## 2024-01-13 DIAGNOSIS — N200 Calculus of kidney: Secondary | ICD-10-CM | POA: Diagnosis not present

## 2024-01-13 DIAGNOSIS — F411 Generalized anxiety disorder: Secondary | ICD-10-CM | POA: Diagnosis not present

## 2024-01-15 ENCOUNTER — Other Ambulatory Visit (HOSPITAL_COMMUNITY): Payer: Self-pay

## 2024-01-15 DIAGNOSIS — Z7951 Long term (current) use of inhaled steroids: Secondary | ICD-10-CM | POA: Diagnosis not present

## 2024-01-15 DIAGNOSIS — F32A Depression, unspecified: Secondary | ICD-10-CM | POA: Diagnosis not present

## 2024-01-15 DIAGNOSIS — M797 Fibromyalgia: Secondary | ICD-10-CM | POA: Diagnosis not present

## 2024-01-15 DIAGNOSIS — G709 Myoneural disorder, unspecified: Secondary | ICD-10-CM | POA: Diagnosis not present

## 2024-01-15 DIAGNOSIS — M1990 Unspecified osteoarthritis, unspecified site: Secondary | ICD-10-CM | POA: Diagnosis not present

## 2024-01-15 DIAGNOSIS — E039 Hypothyroidism, unspecified: Secondary | ICD-10-CM | POA: Diagnosis not present

## 2024-01-15 DIAGNOSIS — G43909 Migraine, unspecified, not intractable, without status migrainosus: Secondary | ICD-10-CM | POA: Diagnosis not present

## 2024-01-15 DIAGNOSIS — Z5982 Transportation insecurity: Secondary | ICD-10-CM | POA: Diagnosis not present

## 2024-01-15 DIAGNOSIS — M25512 Pain in left shoulder: Secondary | ICD-10-CM | POA: Diagnosis not present

## 2024-01-15 DIAGNOSIS — G4733 Obstructive sleep apnea (adult) (pediatric): Secondary | ICD-10-CM | POA: Diagnosis not present

## 2024-01-15 DIAGNOSIS — Z79899 Other long term (current) drug therapy: Secondary | ICD-10-CM | POA: Diagnosis not present

## 2024-01-15 DIAGNOSIS — F419 Anxiety disorder, unspecified: Secondary | ICD-10-CM | POA: Diagnosis not present

## 2024-01-15 DIAGNOSIS — Z8673 Personal history of transient ischemic attack (TIA), and cerebral infarction without residual deficits: Secondary | ICD-10-CM | POA: Diagnosis not present

## 2024-01-15 DIAGNOSIS — I10 Essential (primary) hypertension: Secondary | ICD-10-CM | POA: Diagnosis not present

## 2024-01-15 DIAGNOSIS — J4489 Other specified chronic obstructive pulmonary disease: Secondary | ICD-10-CM | POA: Diagnosis not present

## 2024-01-15 DIAGNOSIS — Z87440 Personal history of urinary (tract) infections: Secondary | ICD-10-CM | POA: Diagnosis not present

## 2024-01-15 DIAGNOSIS — E119 Type 2 diabetes mellitus without complications: Secondary | ICD-10-CM | POA: Diagnosis not present

## 2024-01-15 DIAGNOSIS — Z791 Long term (current) use of non-steroidal anti-inflammatories (NSAID): Secondary | ICD-10-CM | POA: Diagnosis not present

## 2024-01-15 DIAGNOSIS — R471 Dysarthria and anarthria: Secondary | ICD-10-CM | POA: Diagnosis not present

## 2024-01-15 DIAGNOSIS — J309 Allergic rhinitis, unspecified: Secondary | ICD-10-CM | POA: Diagnosis not present

## 2024-01-15 DIAGNOSIS — M5030 Other cervical disc degeneration, unspecified cervical region: Secondary | ICD-10-CM | POA: Diagnosis not present

## 2024-01-15 DIAGNOSIS — G9332 Myalgic encephalomyelitis/chronic fatigue syndrome: Secondary | ICD-10-CM | POA: Diagnosis not present

## 2024-01-15 DIAGNOSIS — E66813 Obesity, class 3: Secondary | ICD-10-CM | POA: Diagnosis not present

## 2024-01-15 DIAGNOSIS — N200 Calculus of kidney: Secondary | ICD-10-CM | POA: Diagnosis not present

## 2024-01-15 DIAGNOSIS — G8929 Other chronic pain: Secondary | ICD-10-CM | POA: Diagnosis not present

## 2024-01-16 DIAGNOSIS — M5030 Other cervical disc degeneration, unspecified cervical region: Secondary | ICD-10-CM | POA: Diagnosis not present

## 2024-01-16 DIAGNOSIS — G709 Myoneural disorder, unspecified: Secondary | ICD-10-CM | POA: Diagnosis not present

## 2024-01-16 DIAGNOSIS — N200 Calculus of kidney: Secondary | ICD-10-CM | POA: Diagnosis not present

## 2024-01-16 DIAGNOSIS — Z7951 Long term (current) use of inhaled steroids: Secondary | ICD-10-CM | POA: Diagnosis not present

## 2024-01-16 DIAGNOSIS — G9332 Myalgic encephalomyelitis/chronic fatigue syndrome: Secondary | ICD-10-CM | POA: Diagnosis not present

## 2024-01-16 DIAGNOSIS — G4733 Obstructive sleep apnea (adult) (pediatric): Secondary | ICD-10-CM | POA: Diagnosis not present

## 2024-01-16 DIAGNOSIS — J309 Allergic rhinitis, unspecified: Secondary | ICD-10-CM | POA: Diagnosis not present

## 2024-01-16 DIAGNOSIS — G8929 Other chronic pain: Secondary | ICD-10-CM | POA: Diagnosis not present

## 2024-01-16 DIAGNOSIS — Z87440 Personal history of urinary (tract) infections: Secondary | ICD-10-CM | POA: Diagnosis not present

## 2024-01-16 DIAGNOSIS — M25512 Pain in left shoulder: Secondary | ICD-10-CM | POA: Diagnosis not present

## 2024-01-16 DIAGNOSIS — F32A Depression, unspecified: Secondary | ICD-10-CM | POA: Diagnosis not present

## 2024-01-16 DIAGNOSIS — M1990 Unspecified osteoarthritis, unspecified site: Secondary | ICD-10-CM | POA: Diagnosis not present

## 2024-01-16 DIAGNOSIS — I10 Essential (primary) hypertension: Secondary | ICD-10-CM | POA: Diagnosis not present

## 2024-01-16 DIAGNOSIS — Z5982 Transportation insecurity: Secondary | ICD-10-CM | POA: Diagnosis not present

## 2024-01-16 DIAGNOSIS — E039 Hypothyroidism, unspecified: Secondary | ICD-10-CM | POA: Diagnosis not present

## 2024-01-16 DIAGNOSIS — Z8673 Personal history of transient ischemic attack (TIA), and cerebral infarction without residual deficits: Secondary | ICD-10-CM | POA: Diagnosis not present

## 2024-01-16 DIAGNOSIS — E66813 Obesity, class 3: Secondary | ICD-10-CM | POA: Diagnosis not present

## 2024-01-16 DIAGNOSIS — Z791 Long term (current) use of non-steroidal anti-inflammatories (NSAID): Secondary | ICD-10-CM | POA: Diagnosis not present

## 2024-01-16 DIAGNOSIS — M797 Fibromyalgia: Secondary | ICD-10-CM | POA: Diagnosis not present

## 2024-01-16 DIAGNOSIS — G43909 Migraine, unspecified, not intractable, without status migrainosus: Secondary | ICD-10-CM | POA: Diagnosis not present

## 2024-01-16 DIAGNOSIS — Z79899 Other long term (current) drug therapy: Secondary | ICD-10-CM | POA: Diagnosis not present

## 2024-01-16 DIAGNOSIS — F419 Anxiety disorder, unspecified: Secondary | ICD-10-CM | POA: Diagnosis not present

## 2024-01-16 DIAGNOSIS — E119 Type 2 diabetes mellitus without complications: Secondary | ICD-10-CM | POA: Diagnosis not present

## 2024-01-16 DIAGNOSIS — J4489 Other specified chronic obstructive pulmonary disease: Secondary | ICD-10-CM | POA: Diagnosis not present

## 2024-01-16 DIAGNOSIS — R471 Dysarthria and anarthria: Secondary | ICD-10-CM | POA: Diagnosis not present

## 2024-01-22 ENCOUNTER — Encounter: Admitting: Student

## 2024-01-22 DIAGNOSIS — M797 Fibromyalgia: Secondary | ICD-10-CM | POA: Diagnosis not present

## 2024-01-22 DIAGNOSIS — M47812 Spondylosis without myelopathy or radiculopathy, cervical region: Secondary | ICD-10-CM | POA: Diagnosis not present

## 2024-01-22 DIAGNOSIS — N200 Calculus of kidney: Secondary | ICD-10-CM | POA: Diagnosis not present

## 2024-01-22 DIAGNOSIS — M25512 Pain in left shoulder: Secondary | ICD-10-CM | POA: Diagnosis not present

## 2024-01-22 DIAGNOSIS — G709 Myoneural disorder, unspecified: Secondary | ICD-10-CM | POA: Diagnosis not present

## 2024-01-22 DIAGNOSIS — F32A Depression, unspecified: Secondary | ICD-10-CM | POA: Diagnosis not present

## 2024-01-22 DIAGNOSIS — E119 Type 2 diabetes mellitus without complications: Secondary | ICD-10-CM | POA: Diagnosis not present

## 2024-01-22 DIAGNOSIS — E66813 Obesity, class 3: Secondary | ICD-10-CM | POA: Diagnosis not present

## 2024-01-22 DIAGNOSIS — Z87440 Personal history of urinary (tract) infections: Secondary | ICD-10-CM | POA: Diagnosis not present

## 2024-01-22 DIAGNOSIS — E039 Hypothyroidism, unspecified: Secondary | ICD-10-CM | POA: Diagnosis not present

## 2024-01-22 DIAGNOSIS — R471 Dysarthria and anarthria: Secondary | ICD-10-CM | POA: Diagnosis not present

## 2024-01-22 DIAGNOSIS — Z79890 Hormone replacement therapy (postmenopausal): Secondary | ICD-10-CM | POA: Diagnosis not present

## 2024-01-22 DIAGNOSIS — G894 Chronic pain syndrome: Secondary | ICD-10-CM | POA: Diagnosis not present

## 2024-01-22 DIAGNOSIS — G4733 Obstructive sleep apnea (adult) (pediatric): Secondary | ICD-10-CM | POA: Diagnosis not present

## 2024-01-22 DIAGNOSIS — J4489 Other specified chronic obstructive pulmonary disease: Secondary | ICD-10-CM | POA: Diagnosis not present

## 2024-01-22 DIAGNOSIS — I10 Essential (primary) hypertension: Secondary | ICD-10-CM | POA: Diagnosis not present

## 2024-01-22 DIAGNOSIS — M1990 Unspecified osteoarthritis, unspecified site: Secondary | ICD-10-CM | POA: Diagnosis not present

## 2024-01-22 DIAGNOSIS — M5030 Other cervical disc degeneration, unspecified cervical region: Secondary | ICD-10-CM | POA: Diagnosis not present

## 2024-01-22 DIAGNOSIS — G43909 Migraine, unspecified, not intractable, without status migrainosus: Secondary | ICD-10-CM | POA: Diagnosis not present

## 2024-01-22 DIAGNOSIS — Z7951 Long term (current) use of inhaled steroids: Secondary | ICD-10-CM | POA: Diagnosis not present

## 2024-01-22 DIAGNOSIS — Z8673 Personal history of transient ischemic attack (TIA), and cerebral infarction without residual deficits: Secondary | ICD-10-CM | POA: Diagnosis not present

## 2024-01-22 DIAGNOSIS — J309 Allergic rhinitis, unspecified: Secondary | ICD-10-CM | POA: Diagnosis not present

## 2024-01-22 DIAGNOSIS — Z791 Long term (current) use of non-steroidal anti-inflammatories (NSAID): Secondary | ICD-10-CM | POA: Diagnosis not present

## 2024-01-22 DIAGNOSIS — Z79899 Other long term (current) drug therapy: Secondary | ICD-10-CM | POA: Diagnosis not present

## 2024-01-22 DIAGNOSIS — F419 Anxiety disorder, unspecified: Secondary | ICD-10-CM | POA: Diagnosis not present

## 2024-01-23 DIAGNOSIS — M25512 Pain in left shoulder: Secondary | ICD-10-CM | POA: Diagnosis not present

## 2024-01-23 DIAGNOSIS — F341 Dysthymic disorder: Secondary | ICD-10-CM | POA: Diagnosis not present

## 2024-01-23 DIAGNOSIS — E66813 Obesity, class 3: Secondary | ICD-10-CM | POA: Diagnosis not present

## 2024-01-23 DIAGNOSIS — N200 Calculus of kidney: Secondary | ICD-10-CM | POA: Diagnosis not present

## 2024-01-23 DIAGNOSIS — M797 Fibromyalgia: Secondary | ICD-10-CM | POA: Diagnosis not present

## 2024-01-23 DIAGNOSIS — G4733 Obstructive sleep apnea (adult) (pediatric): Secondary | ICD-10-CM | POA: Diagnosis not present

## 2024-01-23 DIAGNOSIS — M1990 Unspecified osteoarthritis, unspecified site: Secondary | ICD-10-CM | POA: Diagnosis not present

## 2024-01-23 DIAGNOSIS — I10 Essential (primary) hypertension: Secondary | ICD-10-CM | POA: Diagnosis not present

## 2024-01-23 DIAGNOSIS — J4489 Other specified chronic obstructive pulmonary disease: Secondary | ICD-10-CM | POA: Diagnosis not present

## 2024-01-23 DIAGNOSIS — G709 Myoneural disorder, unspecified: Secondary | ICD-10-CM | POA: Diagnosis not present

## 2024-01-23 DIAGNOSIS — E039 Hypothyroidism, unspecified: Secondary | ICD-10-CM | POA: Diagnosis not present

## 2024-01-23 DIAGNOSIS — R471 Dysarthria and anarthria: Secondary | ICD-10-CM | POA: Diagnosis not present

## 2024-01-23 DIAGNOSIS — G43909 Migraine, unspecified, not intractable, without status migrainosus: Secondary | ICD-10-CM | POA: Diagnosis not present

## 2024-01-23 DIAGNOSIS — Z791 Long term (current) use of non-steroidal anti-inflammatories (NSAID): Secondary | ICD-10-CM | POA: Diagnosis not present

## 2024-01-23 DIAGNOSIS — E119 Type 2 diabetes mellitus without complications: Secondary | ICD-10-CM | POA: Diagnosis not present

## 2024-01-23 DIAGNOSIS — G894 Chronic pain syndrome: Secondary | ICD-10-CM | POA: Diagnosis not present

## 2024-01-23 DIAGNOSIS — M47812 Spondylosis without myelopathy or radiculopathy, cervical region: Secondary | ICD-10-CM | POA: Diagnosis not present

## 2024-01-23 DIAGNOSIS — F32A Depression, unspecified: Secondary | ICD-10-CM | POA: Diagnosis not present

## 2024-01-23 DIAGNOSIS — F411 Generalized anxiety disorder: Secondary | ICD-10-CM | POA: Diagnosis not present

## 2024-01-23 DIAGNOSIS — Z79890 Hormone replacement therapy (postmenopausal): Secondary | ICD-10-CM | POA: Diagnosis not present

## 2024-01-23 DIAGNOSIS — Z8673 Personal history of transient ischemic attack (TIA), and cerebral infarction without residual deficits: Secondary | ICD-10-CM | POA: Diagnosis not present

## 2024-01-23 DIAGNOSIS — M5030 Other cervical disc degeneration, unspecified cervical region: Secondary | ICD-10-CM | POA: Diagnosis not present

## 2024-01-23 DIAGNOSIS — Z79899 Other long term (current) drug therapy: Secondary | ICD-10-CM | POA: Diagnosis not present

## 2024-01-23 DIAGNOSIS — Z7951 Long term (current) use of inhaled steroids: Secondary | ICD-10-CM | POA: Diagnosis not present

## 2024-01-23 DIAGNOSIS — J309 Allergic rhinitis, unspecified: Secondary | ICD-10-CM | POA: Diagnosis not present

## 2024-01-23 DIAGNOSIS — Z87440 Personal history of urinary (tract) infections: Secondary | ICD-10-CM | POA: Diagnosis not present

## 2024-01-23 DIAGNOSIS — F419 Anxiety disorder, unspecified: Secondary | ICD-10-CM | POA: Diagnosis not present

## 2024-01-24 DIAGNOSIS — E119 Type 2 diabetes mellitus without complications: Secondary | ICD-10-CM | POA: Diagnosis not present

## 2024-01-24 DIAGNOSIS — Z79890 Hormone replacement therapy (postmenopausal): Secondary | ICD-10-CM | POA: Diagnosis not present

## 2024-01-24 DIAGNOSIS — M47812 Spondylosis without myelopathy or radiculopathy, cervical region: Secondary | ICD-10-CM | POA: Diagnosis not present

## 2024-01-24 DIAGNOSIS — G43909 Migraine, unspecified, not intractable, without status migrainosus: Secondary | ICD-10-CM | POA: Diagnosis not present

## 2024-01-24 DIAGNOSIS — Z87440 Personal history of urinary (tract) infections: Secondary | ICD-10-CM | POA: Diagnosis not present

## 2024-01-24 DIAGNOSIS — E66813 Obesity, class 3: Secondary | ICD-10-CM | POA: Diagnosis not present

## 2024-01-24 DIAGNOSIS — F32A Depression, unspecified: Secondary | ICD-10-CM | POA: Diagnosis not present

## 2024-01-24 DIAGNOSIS — Z7951 Long term (current) use of inhaled steroids: Secondary | ICD-10-CM | POA: Diagnosis not present

## 2024-01-24 DIAGNOSIS — Z79899 Other long term (current) drug therapy: Secondary | ICD-10-CM | POA: Diagnosis not present

## 2024-01-24 DIAGNOSIS — F419 Anxiety disorder, unspecified: Secondary | ICD-10-CM | POA: Diagnosis not present

## 2024-01-24 DIAGNOSIS — J4489 Other specified chronic obstructive pulmonary disease: Secondary | ICD-10-CM | POA: Diagnosis not present

## 2024-01-24 DIAGNOSIS — I10 Essential (primary) hypertension: Secondary | ICD-10-CM | POA: Diagnosis not present

## 2024-01-24 DIAGNOSIS — N200 Calculus of kidney: Secondary | ICD-10-CM | POA: Diagnosis not present

## 2024-01-24 DIAGNOSIS — M1990 Unspecified osteoarthritis, unspecified site: Secondary | ICD-10-CM | POA: Diagnosis not present

## 2024-01-24 DIAGNOSIS — E039 Hypothyroidism, unspecified: Secondary | ICD-10-CM | POA: Diagnosis not present

## 2024-01-24 DIAGNOSIS — G894 Chronic pain syndrome: Secondary | ICD-10-CM | POA: Diagnosis not present

## 2024-01-24 DIAGNOSIS — J309 Allergic rhinitis, unspecified: Secondary | ICD-10-CM | POA: Diagnosis not present

## 2024-01-24 DIAGNOSIS — R471 Dysarthria and anarthria: Secondary | ICD-10-CM | POA: Diagnosis not present

## 2024-01-24 DIAGNOSIS — M5030 Other cervical disc degeneration, unspecified cervical region: Secondary | ICD-10-CM | POA: Diagnosis not present

## 2024-01-24 DIAGNOSIS — G4733 Obstructive sleep apnea (adult) (pediatric): Secondary | ICD-10-CM | POA: Diagnosis not present

## 2024-01-24 DIAGNOSIS — G709 Myoneural disorder, unspecified: Secondary | ICD-10-CM | POA: Diagnosis not present

## 2024-01-24 DIAGNOSIS — M25512 Pain in left shoulder: Secondary | ICD-10-CM | POA: Diagnosis not present

## 2024-01-24 DIAGNOSIS — Z8673 Personal history of transient ischemic attack (TIA), and cerebral infarction without residual deficits: Secondary | ICD-10-CM | POA: Diagnosis not present

## 2024-01-24 DIAGNOSIS — Z791 Long term (current) use of non-steroidal anti-inflammatories (NSAID): Secondary | ICD-10-CM | POA: Diagnosis not present

## 2024-01-24 DIAGNOSIS — M797 Fibromyalgia: Secondary | ICD-10-CM | POA: Diagnosis not present

## 2024-01-26 DIAGNOSIS — R413 Other amnesia: Secondary | ICD-10-CM | POA: Diagnosis not present

## 2024-01-26 DIAGNOSIS — I509 Heart failure, unspecified: Secondary | ICD-10-CM | POA: Diagnosis not present

## 2024-01-26 DIAGNOSIS — E119 Type 2 diabetes mellitus without complications: Secondary | ICD-10-CM | POA: Diagnosis not present

## 2024-01-26 DIAGNOSIS — M62838 Other muscle spasm: Secondary | ICD-10-CM | POA: Diagnosis not present

## 2024-01-26 DIAGNOSIS — R630 Anorexia: Secondary | ICD-10-CM | POA: Diagnosis not present

## 2024-01-26 DIAGNOSIS — N952 Postmenopausal atrophic vaginitis: Secondary | ICD-10-CM | POA: Diagnosis not present

## 2024-01-26 DIAGNOSIS — L732 Hidradenitis suppurativa: Secondary | ICD-10-CM | POA: Diagnosis not present

## 2024-01-26 DIAGNOSIS — M1711 Unilateral primary osteoarthritis, right knee: Secondary | ICD-10-CM | POA: Diagnosis not present

## 2024-01-26 DIAGNOSIS — Z79899 Other long term (current) drug therapy: Secondary | ICD-10-CM | POA: Diagnosis not present

## 2024-01-26 DIAGNOSIS — Z9181 History of falling: Secondary | ICD-10-CM | POA: Diagnosis not present

## 2024-01-26 DIAGNOSIS — G4730 Sleep apnea, unspecified: Secondary | ICD-10-CM | POA: Diagnosis not present

## 2024-01-28 DIAGNOSIS — Z79890 Hormone replacement therapy (postmenopausal): Secondary | ICD-10-CM | POA: Diagnosis not present

## 2024-01-28 DIAGNOSIS — E039 Hypothyroidism, unspecified: Secondary | ICD-10-CM | POA: Diagnosis not present

## 2024-01-28 DIAGNOSIS — Z79899 Other long term (current) drug therapy: Secondary | ICD-10-CM | POA: Diagnosis not present

## 2024-01-28 DIAGNOSIS — J4489 Other specified chronic obstructive pulmonary disease: Secondary | ICD-10-CM | POA: Diagnosis not present

## 2024-01-28 DIAGNOSIS — E66813 Obesity, class 3: Secondary | ICD-10-CM | POA: Diagnosis not present

## 2024-01-28 DIAGNOSIS — M797 Fibromyalgia: Secondary | ICD-10-CM | POA: Diagnosis not present

## 2024-01-28 DIAGNOSIS — G894 Chronic pain syndrome: Secondary | ICD-10-CM | POA: Diagnosis not present

## 2024-01-28 DIAGNOSIS — F419 Anxiety disorder, unspecified: Secondary | ICD-10-CM | POA: Diagnosis not present

## 2024-01-28 DIAGNOSIS — Z87440 Personal history of urinary (tract) infections: Secondary | ICD-10-CM | POA: Diagnosis not present

## 2024-01-28 DIAGNOSIS — N200 Calculus of kidney: Secondary | ICD-10-CM | POA: Diagnosis not present

## 2024-01-28 DIAGNOSIS — F32A Depression, unspecified: Secondary | ICD-10-CM | POA: Diagnosis not present

## 2024-01-28 DIAGNOSIS — J309 Allergic rhinitis, unspecified: Secondary | ICD-10-CM | POA: Diagnosis not present

## 2024-01-28 DIAGNOSIS — M5030 Other cervical disc degeneration, unspecified cervical region: Secondary | ICD-10-CM | POA: Diagnosis not present

## 2024-01-28 DIAGNOSIS — M47812 Spondylosis without myelopathy or radiculopathy, cervical region: Secondary | ICD-10-CM | POA: Diagnosis not present

## 2024-01-28 DIAGNOSIS — Z791 Long term (current) use of non-steroidal anti-inflammatories (NSAID): Secondary | ICD-10-CM | POA: Diagnosis not present

## 2024-01-28 DIAGNOSIS — R471 Dysarthria and anarthria: Secondary | ICD-10-CM | POA: Diagnosis not present

## 2024-01-28 DIAGNOSIS — Z7951 Long term (current) use of inhaled steroids: Secondary | ICD-10-CM | POA: Diagnosis not present

## 2024-01-28 DIAGNOSIS — E119 Type 2 diabetes mellitus without complications: Secondary | ICD-10-CM | POA: Diagnosis not present

## 2024-01-28 DIAGNOSIS — M1990 Unspecified osteoarthritis, unspecified site: Secondary | ICD-10-CM | POA: Diagnosis not present

## 2024-01-28 DIAGNOSIS — M25512 Pain in left shoulder: Secondary | ICD-10-CM | POA: Diagnosis not present

## 2024-01-28 DIAGNOSIS — I10 Essential (primary) hypertension: Secondary | ICD-10-CM | POA: Diagnosis not present

## 2024-01-28 DIAGNOSIS — Z8673 Personal history of transient ischemic attack (TIA), and cerebral infarction without residual deficits: Secondary | ICD-10-CM | POA: Diagnosis not present

## 2024-01-28 DIAGNOSIS — G43909 Migraine, unspecified, not intractable, without status migrainosus: Secondary | ICD-10-CM | POA: Diagnosis not present

## 2024-01-28 DIAGNOSIS — G4733 Obstructive sleep apnea (adult) (pediatric): Secondary | ICD-10-CM | POA: Diagnosis not present

## 2024-01-28 DIAGNOSIS — G709 Myoneural disorder, unspecified: Secondary | ICD-10-CM | POA: Diagnosis not present

## 2024-01-29 DIAGNOSIS — M25512 Pain in left shoulder: Secondary | ICD-10-CM | POA: Diagnosis not present

## 2024-01-29 DIAGNOSIS — F419 Anxiety disorder, unspecified: Secondary | ICD-10-CM | POA: Diagnosis not present

## 2024-01-29 DIAGNOSIS — R471 Dysarthria and anarthria: Secondary | ICD-10-CM | POA: Diagnosis not present

## 2024-01-29 DIAGNOSIS — M1990 Unspecified osteoarthritis, unspecified site: Secondary | ICD-10-CM | POA: Diagnosis not present

## 2024-01-29 DIAGNOSIS — I10 Essential (primary) hypertension: Secondary | ICD-10-CM | POA: Diagnosis not present

## 2024-01-29 DIAGNOSIS — M47812 Spondylosis without myelopathy or radiculopathy, cervical region: Secondary | ICD-10-CM | POA: Diagnosis not present

## 2024-01-29 DIAGNOSIS — J4489 Other specified chronic obstructive pulmonary disease: Secondary | ICD-10-CM | POA: Diagnosis not present

## 2024-01-29 DIAGNOSIS — G43909 Migraine, unspecified, not intractable, without status migrainosus: Secondary | ICD-10-CM | POA: Diagnosis not present

## 2024-01-29 DIAGNOSIS — Z791 Long term (current) use of non-steroidal anti-inflammatories (NSAID): Secondary | ICD-10-CM | POA: Diagnosis not present

## 2024-01-29 DIAGNOSIS — E119 Type 2 diabetes mellitus without complications: Secondary | ICD-10-CM | POA: Diagnosis not present

## 2024-01-29 DIAGNOSIS — E039 Hypothyroidism, unspecified: Secondary | ICD-10-CM | POA: Diagnosis not present

## 2024-01-29 DIAGNOSIS — Z8673 Personal history of transient ischemic attack (TIA), and cerebral infarction without residual deficits: Secondary | ICD-10-CM | POA: Diagnosis not present

## 2024-01-29 DIAGNOSIS — G4733 Obstructive sleep apnea (adult) (pediatric): Secondary | ICD-10-CM | POA: Diagnosis not present

## 2024-01-29 DIAGNOSIS — M5030 Other cervical disc degeneration, unspecified cervical region: Secondary | ICD-10-CM | POA: Diagnosis not present

## 2024-01-29 DIAGNOSIS — Z87440 Personal history of urinary (tract) infections: Secondary | ICD-10-CM | POA: Diagnosis not present

## 2024-01-29 DIAGNOSIS — Z79890 Hormone replacement therapy (postmenopausal): Secondary | ICD-10-CM | POA: Diagnosis not present

## 2024-01-29 DIAGNOSIS — Z7951 Long term (current) use of inhaled steroids: Secondary | ICD-10-CM | POA: Diagnosis not present

## 2024-01-29 DIAGNOSIS — G709 Myoneural disorder, unspecified: Secondary | ICD-10-CM | POA: Diagnosis not present

## 2024-01-29 DIAGNOSIS — J309 Allergic rhinitis, unspecified: Secondary | ICD-10-CM | POA: Diagnosis not present

## 2024-01-29 DIAGNOSIS — E66813 Obesity, class 3: Secondary | ICD-10-CM | POA: Diagnosis not present

## 2024-01-29 DIAGNOSIS — F32A Depression, unspecified: Secondary | ICD-10-CM | POA: Diagnosis not present

## 2024-01-29 DIAGNOSIS — M797 Fibromyalgia: Secondary | ICD-10-CM | POA: Diagnosis not present

## 2024-01-29 DIAGNOSIS — Z79899 Other long term (current) drug therapy: Secondary | ICD-10-CM | POA: Diagnosis not present

## 2024-01-29 DIAGNOSIS — G894 Chronic pain syndrome: Secondary | ICD-10-CM | POA: Diagnosis not present

## 2024-01-29 DIAGNOSIS — N200 Calculus of kidney: Secondary | ICD-10-CM | POA: Diagnosis not present

## 2024-01-30 DIAGNOSIS — F411 Generalized anxiety disorder: Secondary | ICD-10-CM | POA: Diagnosis not present

## 2024-01-30 DIAGNOSIS — F341 Dysthymic disorder: Secondary | ICD-10-CM | POA: Diagnosis not present

## 2024-01-31 ENCOUNTER — Other Ambulatory Visit (HOSPITAL_BASED_OUTPATIENT_CLINIC_OR_DEPARTMENT_OTHER): Payer: Self-pay | Admitting: Physician Assistant

## 2024-01-31 DIAGNOSIS — Z1231 Encounter for screening mammogram for malignant neoplasm of breast: Secondary | ICD-10-CM

## 2024-02-01 DIAGNOSIS — F341 Dysthymic disorder: Secondary | ICD-10-CM | POA: Diagnosis not present

## 2024-02-01 DIAGNOSIS — F411 Generalized anxiety disorder: Secondary | ICD-10-CM | POA: Diagnosis not present

## 2024-02-03 DIAGNOSIS — F411 Generalized anxiety disorder: Secondary | ICD-10-CM | POA: Diagnosis not present

## 2024-02-03 DIAGNOSIS — F341 Dysthymic disorder: Secondary | ICD-10-CM | POA: Diagnosis not present

## 2024-02-04 ENCOUNTER — Inpatient Hospital Stay (HOSPITAL_BASED_OUTPATIENT_CLINIC_OR_DEPARTMENT_OTHER): Admission: RE | Admit: 2024-02-04 | Source: Ambulatory Visit | Admitting: Radiology

## 2024-02-04 DIAGNOSIS — Z1231 Encounter for screening mammogram for malignant neoplasm of breast: Secondary | ICD-10-CM

## 2024-02-06 DIAGNOSIS — F411 Generalized anxiety disorder: Secondary | ICD-10-CM | POA: Diagnosis not present

## 2024-02-06 DIAGNOSIS — F341 Dysthymic disorder: Secondary | ICD-10-CM | POA: Diagnosis not present

## 2024-02-10 DIAGNOSIS — F341 Dysthymic disorder: Secondary | ICD-10-CM | POA: Diagnosis not present

## 2024-02-10 DIAGNOSIS — F411 Generalized anxiety disorder: Secondary | ICD-10-CM | POA: Diagnosis not present

## 2024-02-13 DIAGNOSIS — G894 Chronic pain syndrome: Secondary | ICD-10-CM | POA: Diagnosis not present

## 2024-02-13 DIAGNOSIS — M25512 Pain in left shoulder: Secondary | ICD-10-CM | POA: Diagnosis not present

## 2024-02-13 DIAGNOSIS — Z79890 Hormone replacement therapy (postmenopausal): Secondary | ICD-10-CM | POA: Diagnosis not present

## 2024-02-13 DIAGNOSIS — G43909 Migraine, unspecified, not intractable, without status migrainosus: Secondary | ICD-10-CM | POA: Diagnosis not present

## 2024-02-13 DIAGNOSIS — G4733 Obstructive sleep apnea (adult) (pediatric): Secondary | ICD-10-CM | POA: Diagnosis not present

## 2024-02-13 DIAGNOSIS — M47812 Spondylosis without myelopathy or radiculopathy, cervical region: Secondary | ICD-10-CM | POA: Diagnosis not present

## 2024-02-13 DIAGNOSIS — N200 Calculus of kidney: Secondary | ICD-10-CM | POA: Diagnosis not present

## 2024-02-13 DIAGNOSIS — Z791 Long term (current) use of non-steroidal anti-inflammatories (NSAID): Secondary | ICD-10-CM | POA: Diagnosis not present

## 2024-02-13 DIAGNOSIS — F32A Depression, unspecified: Secondary | ICD-10-CM | POA: Diagnosis not present

## 2024-02-13 DIAGNOSIS — Z87440 Personal history of urinary (tract) infections: Secondary | ICD-10-CM | POA: Diagnosis not present

## 2024-02-13 DIAGNOSIS — M5030 Other cervical disc degeneration, unspecified cervical region: Secondary | ICD-10-CM | POA: Diagnosis not present

## 2024-02-13 DIAGNOSIS — M797 Fibromyalgia: Secondary | ICD-10-CM | POA: Diagnosis not present

## 2024-02-13 DIAGNOSIS — Z79899 Other long term (current) drug therapy: Secondary | ICD-10-CM | POA: Diagnosis not present

## 2024-02-13 DIAGNOSIS — E66813 Obesity, class 3: Secondary | ICD-10-CM | POA: Diagnosis not present

## 2024-02-13 DIAGNOSIS — M1990 Unspecified osteoarthritis, unspecified site: Secondary | ICD-10-CM | POA: Diagnosis not present

## 2024-02-13 DIAGNOSIS — I10 Essential (primary) hypertension: Secondary | ICD-10-CM | POA: Diagnosis not present

## 2024-02-13 DIAGNOSIS — R471 Dysarthria and anarthria: Secondary | ICD-10-CM | POA: Diagnosis not present

## 2024-02-13 DIAGNOSIS — G709 Myoneural disorder, unspecified: Secondary | ICD-10-CM | POA: Diagnosis not present

## 2024-02-13 DIAGNOSIS — Z8673 Personal history of transient ischemic attack (TIA), and cerebral infarction without residual deficits: Secondary | ICD-10-CM | POA: Diagnosis not present

## 2024-02-13 DIAGNOSIS — Z7951 Long term (current) use of inhaled steroids: Secondary | ICD-10-CM | POA: Diagnosis not present

## 2024-02-13 DIAGNOSIS — E039 Hypothyroidism, unspecified: Secondary | ICD-10-CM | POA: Diagnosis not present

## 2024-02-13 DIAGNOSIS — J309 Allergic rhinitis, unspecified: Secondary | ICD-10-CM | POA: Diagnosis not present

## 2024-02-13 DIAGNOSIS — E119 Type 2 diabetes mellitus without complications: Secondary | ICD-10-CM | POA: Diagnosis not present

## 2024-02-13 DIAGNOSIS — J4489 Other specified chronic obstructive pulmonary disease: Secondary | ICD-10-CM | POA: Diagnosis not present

## 2024-02-13 DIAGNOSIS — F419 Anxiety disorder, unspecified: Secondary | ICD-10-CM | POA: Diagnosis not present

## 2024-02-14 DIAGNOSIS — E039 Hypothyroidism, unspecified: Secondary | ICD-10-CM | POA: Diagnosis not present

## 2024-02-14 DIAGNOSIS — G894 Chronic pain syndrome: Secondary | ICD-10-CM | POA: Diagnosis not present

## 2024-02-14 DIAGNOSIS — J309 Allergic rhinitis, unspecified: Secondary | ICD-10-CM | POA: Diagnosis not present

## 2024-02-14 DIAGNOSIS — Z8673 Personal history of transient ischemic attack (TIA), and cerebral infarction without residual deficits: Secondary | ICD-10-CM | POA: Diagnosis not present

## 2024-02-14 DIAGNOSIS — G4733 Obstructive sleep apnea (adult) (pediatric): Secondary | ICD-10-CM | POA: Diagnosis not present

## 2024-02-14 DIAGNOSIS — M1990 Unspecified osteoarthritis, unspecified site: Secondary | ICD-10-CM | POA: Diagnosis not present

## 2024-02-14 DIAGNOSIS — Z79899 Other long term (current) drug therapy: Secondary | ICD-10-CM | POA: Diagnosis not present

## 2024-02-14 DIAGNOSIS — F32A Depression, unspecified: Secondary | ICD-10-CM | POA: Diagnosis not present

## 2024-02-14 DIAGNOSIS — Z87440 Personal history of urinary (tract) infections: Secondary | ICD-10-CM | POA: Diagnosis not present

## 2024-02-14 DIAGNOSIS — M47812 Spondylosis without myelopathy or radiculopathy, cervical region: Secondary | ICD-10-CM | POA: Diagnosis not present

## 2024-02-14 DIAGNOSIS — R471 Dysarthria and anarthria: Secondary | ICD-10-CM | POA: Diagnosis not present

## 2024-02-14 DIAGNOSIS — G43909 Migraine, unspecified, not intractable, without status migrainosus: Secondary | ICD-10-CM | POA: Diagnosis not present

## 2024-02-14 DIAGNOSIS — I10 Essential (primary) hypertension: Secondary | ICD-10-CM | POA: Diagnosis not present

## 2024-02-14 DIAGNOSIS — G709 Myoneural disorder, unspecified: Secondary | ICD-10-CM | POA: Diagnosis not present

## 2024-02-14 DIAGNOSIS — F419 Anxiety disorder, unspecified: Secondary | ICD-10-CM | POA: Diagnosis not present

## 2024-02-14 DIAGNOSIS — Z79890 Hormone replacement therapy (postmenopausal): Secondary | ICD-10-CM | POA: Diagnosis not present

## 2024-02-14 DIAGNOSIS — Z791 Long term (current) use of non-steroidal anti-inflammatories (NSAID): Secondary | ICD-10-CM | POA: Diagnosis not present

## 2024-02-14 DIAGNOSIS — E119 Type 2 diabetes mellitus without complications: Secondary | ICD-10-CM | POA: Diagnosis not present

## 2024-02-14 DIAGNOSIS — Z7951 Long term (current) use of inhaled steroids: Secondary | ICD-10-CM | POA: Diagnosis not present

## 2024-02-14 DIAGNOSIS — N200 Calculus of kidney: Secondary | ICD-10-CM | POA: Diagnosis not present

## 2024-02-14 DIAGNOSIS — J4489 Other specified chronic obstructive pulmonary disease: Secondary | ICD-10-CM | POA: Diagnosis not present

## 2024-02-14 DIAGNOSIS — E66813 Obesity, class 3: Secondary | ICD-10-CM | POA: Diagnosis not present

## 2024-02-14 DIAGNOSIS — M25512 Pain in left shoulder: Secondary | ICD-10-CM | POA: Diagnosis not present

## 2024-02-14 DIAGNOSIS — M5030 Other cervical disc degeneration, unspecified cervical region: Secondary | ICD-10-CM | POA: Diagnosis not present

## 2024-02-14 DIAGNOSIS — M797 Fibromyalgia: Secondary | ICD-10-CM | POA: Diagnosis not present

## 2024-02-19 DIAGNOSIS — E66813 Obesity, class 3: Secondary | ICD-10-CM | POA: Diagnosis not present

## 2024-02-19 DIAGNOSIS — M1990 Unspecified osteoarthritis, unspecified site: Secondary | ICD-10-CM | POA: Diagnosis not present

## 2024-02-19 DIAGNOSIS — E039 Hypothyroidism, unspecified: Secondary | ICD-10-CM | POA: Diagnosis not present

## 2024-02-19 DIAGNOSIS — M47812 Spondylosis without myelopathy or radiculopathy, cervical region: Secondary | ICD-10-CM | POA: Diagnosis not present

## 2024-02-19 DIAGNOSIS — G4733 Obstructive sleep apnea (adult) (pediatric): Secondary | ICD-10-CM | POA: Diagnosis not present

## 2024-02-19 DIAGNOSIS — E119 Type 2 diabetes mellitus without complications: Secondary | ICD-10-CM | POA: Diagnosis not present

## 2024-02-19 DIAGNOSIS — M5030 Other cervical disc degeneration, unspecified cervical region: Secondary | ICD-10-CM | POA: Diagnosis not present

## 2024-02-19 DIAGNOSIS — F411 Generalized anxiety disorder: Secondary | ICD-10-CM | POA: Diagnosis not present

## 2024-02-19 DIAGNOSIS — F32A Depression, unspecified: Secondary | ICD-10-CM | POA: Diagnosis not present

## 2024-02-19 DIAGNOSIS — F419 Anxiety disorder, unspecified: Secondary | ICD-10-CM | POA: Diagnosis not present

## 2024-02-19 DIAGNOSIS — Z7951 Long term (current) use of inhaled steroids: Secondary | ICD-10-CM | POA: Diagnosis not present

## 2024-02-19 DIAGNOSIS — Z8673 Personal history of transient ischemic attack (TIA), and cerebral infarction without residual deficits: Secondary | ICD-10-CM | POA: Diagnosis not present

## 2024-02-19 DIAGNOSIS — R471 Dysarthria and anarthria: Secondary | ICD-10-CM | POA: Diagnosis not present

## 2024-02-19 DIAGNOSIS — J4489 Other specified chronic obstructive pulmonary disease: Secondary | ICD-10-CM | POA: Diagnosis not present

## 2024-02-19 DIAGNOSIS — Z87440 Personal history of urinary (tract) infections: Secondary | ICD-10-CM | POA: Diagnosis not present

## 2024-02-19 DIAGNOSIS — Z791 Long term (current) use of non-steroidal anti-inflammatories (NSAID): Secondary | ICD-10-CM | POA: Diagnosis not present

## 2024-02-19 DIAGNOSIS — M25512 Pain in left shoulder: Secondary | ICD-10-CM | POA: Diagnosis not present

## 2024-02-19 DIAGNOSIS — J309 Allergic rhinitis, unspecified: Secondary | ICD-10-CM | POA: Diagnosis not present

## 2024-02-19 DIAGNOSIS — G709 Myoneural disorder, unspecified: Secondary | ICD-10-CM | POA: Diagnosis not present

## 2024-02-19 DIAGNOSIS — M797 Fibromyalgia: Secondary | ICD-10-CM | POA: Diagnosis not present

## 2024-02-19 DIAGNOSIS — Z79899 Other long term (current) drug therapy: Secondary | ICD-10-CM | POA: Diagnosis not present

## 2024-02-19 DIAGNOSIS — G43909 Migraine, unspecified, not intractable, without status migrainosus: Secondary | ICD-10-CM | POA: Diagnosis not present

## 2024-02-19 DIAGNOSIS — G894 Chronic pain syndrome: Secondary | ICD-10-CM | POA: Diagnosis not present

## 2024-02-19 DIAGNOSIS — F341 Dysthymic disorder: Secondary | ICD-10-CM | POA: Diagnosis not present

## 2024-02-19 DIAGNOSIS — I10 Essential (primary) hypertension: Secondary | ICD-10-CM | POA: Diagnosis not present

## 2024-02-19 DIAGNOSIS — Z79890 Hormone replacement therapy (postmenopausal): Secondary | ICD-10-CM | POA: Diagnosis not present

## 2024-02-19 DIAGNOSIS — N200 Calculus of kidney: Secondary | ICD-10-CM | POA: Diagnosis not present

## 2024-02-25 DIAGNOSIS — M797 Fibromyalgia: Secondary | ICD-10-CM | POA: Diagnosis not present

## 2024-02-25 DIAGNOSIS — J4489 Other specified chronic obstructive pulmonary disease: Secondary | ICD-10-CM | POA: Diagnosis not present

## 2024-02-25 DIAGNOSIS — I10 Essential (primary) hypertension: Secondary | ICD-10-CM | POA: Diagnosis not present

## 2024-02-25 DIAGNOSIS — M1990 Unspecified osteoarthritis, unspecified site: Secondary | ICD-10-CM | POA: Diagnosis not present

## 2024-02-25 DIAGNOSIS — M47812 Spondylosis without myelopathy or radiculopathy, cervical region: Secondary | ICD-10-CM | POA: Diagnosis not present

## 2024-02-25 DIAGNOSIS — F32A Depression, unspecified: Secondary | ICD-10-CM | POA: Diagnosis not present

## 2024-02-25 DIAGNOSIS — Z79899 Other long term (current) drug therapy: Secondary | ICD-10-CM | POA: Diagnosis not present

## 2024-02-25 DIAGNOSIS — Z79890 Hormone replacement therapy (postmenopausal): Secondary | ICD-10-CM | POA: Diagnosis not present

## 2024-02-25 DIAGNOSIS — E66813 Obesity, class 3: Secondary | ICD-10-CM | POA: Diagnosis not present

## 2024-02-25 DIAGNOSIS — G4733 Obstructive sleep apnea (adult) (pediatric): Secondary | ICD-10-CM | POA: Diagnosis not present

## 2024-02-25 DIAGNOSIS — E119 Type 2 diabetes mellitus without complications: Secondary | ICD-10-CM | POA: Diagnosis not present

## 2024-02-25 DIAGNOSIS — R471 Dysarthria and anarthria: Secondary | ICD-10-CM | POA: Diagnosis not present

## 2024-02-25 DIAGNOSIS — Z87440 Personal history of urinary (tract) infections: Secondary | ICD-10-CM | POA: Diagnosis not present

## 2024-02-25 DIAGNOSIS — G709 Myoneural disorder, unspecified: Secondary | ICD-10-CM | POA: Diagnosis not present

## 2024-02-25 DIAGNOSIS — G43909 Migraine, unspecified, not intractable, without status migrainosus: Secondary | ICD-10-CM | POA: Diagnosis not present

## 2024-02-25 DIAGNOSIS — G894 Chronic pain syndrome: Secondary | ICD-10-CM | POA: Diagnosis not present

## 2024-02-25 DIAGNOSIS — M5030 Other cervical disc degeneration, unspecified cervical region: Secondary | ICD-10-CM | POA: Diagnosis not present

## 2024-02-25 DIAGNOSIS — Z791 Long term (current) use of non-steroidal anti-inflammatories (NSAID): Secondary | ICD-10-CM | POA: Diagnosis not present

## 2024-02-25 DIAGNOSIS — N200 Calculus of kidney: Secondary | ICD-10-CM | POA: Diagnosis not present

## 2024-02-25 DIAGNOSIS — Z7951 Long term (current) use of inhaled steroids: Secondary | ICD-10-CM | POA: Diagnosis not present

## 2024-02-25 DIAGNOSIS — E039 Hypothyroidism, unspecified: Secondary | ICD-10-CM | POA: Diagnosis not present

## 2024-02-25 DIAGNOSIS — Z8673 Personal history of transient ischemic attack (TIA), and cerebral infarction without residual deficits: Secondary | ICD-10-CM | POA: Diagnosis not present

## 2024-02-25 DIAGNOSIS — J309 Allergic rhinitis, unspecified: Secondary | ICD-10-CM | POA: Diagnosis not present

## 2024-02-25 DIAGNOSIS — M25512 Pain in left shoulder: Secondary | ICD-10-CM | POA: Diagnosis not present

## 2024-02-25 DIAGNOSIS — F419 Anxiety disorder, unspecified: Secondary | ICD-10-CM | POA: Diagnosis not present

## 2024-02-28 DIAGNOSIS — Z7951 Long term (current) use of inhaled steroids: Secondary | ICD-10-CM | POA: Diagnosis not present

## 2024-02-28 DIAGNOSIS — E119 Type 2 diabetes mellitus without complications: Secondary | ICD-10-CM | POA: Diagnosis not present

## 2024-02-28 DIAGNOSIS — E039 Hypothyroidism, unspecified: Secondary | ICD-10-CM | POA: Diagnosis not present

## 2024-02-28 DIAGNOSIS — Z8673 Personal history of transient ischemic attack (TIA), and cerebral infarction without residual deficits: Secondary | ICD-10-CM | POA: Diagnosis not present

## 2024-02-28 DIAGNOSIS — M5030 Other cervical disc degeneration, unspecified cervical region: Secondary | ICD-10-CM | POA: Diagnosis not present

## 2024-02-28 DIAGNOSIS — Z79899 Other long term (current) drug therapy: Secondary | ICD-10-CM | POA: Diagnosis not present

## 2024-02-28 DIAGNOSIS — Z79890 Hormone replacement therapy (postmenopausal): Secondary | ICD-10-CM | POA: Diagnosis not present

## 2024-02-28 DIAGNOSIS — J309 Allergic rhinitis, unspecified: Secondary | ICD-10-CM | POA: Diagnosis not present

## 2024-02-28 DIAGNOSIS — Z791 Long term (current) use of non-steroidal anti-inflammatories (NSAID): Secondary | ICD-10-CM | POA: Diagnosis not present

## 2024-02-28 DIAGNOSIS — I10 Essential (primary) hypertension: Secondary | ICD-10-CM | POA: Diagnosis not present

## 2024-02-28 DIAGNOSIS — R471 Dysarthria and anarthria: Secondary | ICD-10-CM | POA: Diagnosis not present

## 2024-02-28 DIAGNOSIS — M47812 Spondylosis without myelopathy or radiculopathy, cervical region: Secondary | ICD-10-CM | POA: Diagnosis not present

## 2024-02-28 DIAGNOSIS — N200 Calculus of kidney: Secondary | ICD-10-CM | POA: Diagnosis not present

## 2024-02-28 DIAGNOSIS — J4489 Other specified chronic obstructive pulmonary disease: Secondary | ICD-10-CM | POA: Diagnosis not present

## 2024-02-28 DIAGNOSIS — M1990 Unspecified osteoarthritis, unspecified site: Secondary | ICD-10-CM | POA: Diagnosis not present

## 2024-02-28 DIAGNOSIS — G709 Myoneural disorder, unspecified: Secondary | ICD-10-CM | POA: Diagnosis not present

## 2024-02-28 DIAGNOSIS — E66813 Obesity, class 3: Secondary | ICD-10-CM | POA: Diagnosis not present

## 2024-02-28 DIAGNOSIS — M797 Fibromyalgia: Secondary | ICD-10-CM | POA: Diagnosis not present

## 2024-02-28 DIAGNOSIS — G43909 Migraine, unspecified, not intractable, without status migrainosus: Secondary | ICD-10-CM | POA: Diagnosis not present

## 2024-02-28 DIAGNOSIS — Z87440 Personal history of urinary (tract) infections: Secondary | ICD-10-CM | POA: Diagnosis not present

## 2024-02-28 DIAGNOSIS — F32A Depression, unspecified: Secondary | ICD-10-CM | POA: Diagnosis not present

## 2024-02-28 DIAGNOSIS — F419 Anxiety disorder, unspecified: Secondary | ICD-10-CM | POA: Diagnosis not present

## 2024-02-28 DIAGNOSIS — M25512 Pain in left shoulder: Secondary | ICD-10-CM | POA: Diagnosis not present

## 2024-02-28 DIAGNOSIS — G4733 Obstructive sleep apnea (adult) (pediatric): Secondary | ICD-10-CM | POA: Diagnosis not present

## 2024-02-28 DIAGNOSIS — G894 Chronic pain syndrome: Secondary | ICD-10-CM | POA: Diagnosis not present

## 2024-02-29 ENCOUNTER — Inpatient Hospital Stay (HOSPITAL_BASED_OUTPATIENT_CLINIC_OR_DEPARTMENT_OTHER): Admission: RE | Admit: 2024-02-29 | Source: Ambulatory Visit | Admitting: Radiology

## 2024-02-29 ENCOUNTER — Encounter (HOSPITAL_COMMUNITY): Payer: Self-pay | Admitting: *Deleted

## 2024-02-29 ENCOUNTER — Other Ambulatory Visit: Payer: Self-pay

## 2024-02-29 ENCOUNTER — Emergency Department (HOSPITAL_COMMUNITY)
Admission: EM | Admit: 2024-02-29 | Discharge: 2024-03-01 | Disposition: A | Discharge status: Home / Self Care | Attending: Emergency Medicine | Admitting: Emergency Medicine

## 2024-02-29 ENCOUNTER — Emergency Department (HOSPITAL_COMMUNITY)

## 2024-02-29 DIAGNOSIS — I10 Essential (primary) hypertension: Secondary | ICD-10-CM | POA: Insufficient documentation

## 2024-02-29 DIAGNOSIS — E039 Hypothyroidism, unspecified: Secondary | ICD-10-CM | POA: Insufficient documentation

## 2024-02-29 DIAGNOSIS — Z79890 Hormone replacement therapy (postmenopausal): Secondary | ICD-10-CM | POA: Diagnosis not present

## 2024-02-29 DIAGNOSIS — Z8673 Personal history of transient ischemic attack (TIA), and cerebral infarction without residual deficits: Secondary | ICD-10-CM | POA: Insufficient documentation

## 2024-02-29 DIAGNOSIS — Z79899 Other long term (current) drug therapy: Secondary | ICD-10-CM | POA: Insufficient documentation

## 2024-02-29 DIAGNOSIS — R202 Paresthesia of skin: Secondary | ICD-10-CM | POA: Insufficient documentation

## 2024-02-29 DIAGNOSIS — R200 Anesthesia of skin: Secondary | ICD-10-CM | POA: Diagnosis not present

## 2024-02-29 DIAGNOSIS — G43809 Other migraine, not intractable, without status migrainosus: Secondary | ICD-10-CM | POA: Insufficient documentation

## 2024-02-29 LAB — CBC
HCT: 41.9 % (ref 36.0–46.0)
Hemoglobin: 13.4 g/dL (ref 12.0–15.0)
MCH: 27.5 pg (ref 26.0–34.0)
MCHC: 32 g/dL (ref 30.0–36.0)
MCV: 85.9 fL (ref 80.0–100.0)
Platelets: 327 K/uL (ref 150–400)
RBC: 4.88 MIL/uL (ref 3.87–5.11)
RDW: 13.6 % (ref 11.5–15.5)
WBC: 9.6 K/uL (ref 4.0–10.5)
nRBC: 0 % (ref 0.0–0.2)

## 2024-02-29 LAB — COMPREHENSIVE METABOLIC PANEL WITH GFR
ALT: 18 U/L (ref 0–44)
AST: 16 U/L (ref 15–41)
Albumin: 3.5 g/dL (ref 3.5–5.0)
Alkaline Phosphatase: 58 U/L (ref 38–126)
Anion gap: 12 (ref 5–15)
BUN: 6 mg/dL (ref 6–20)
CO2: 25 mmol/L (ref 22–32)
Calcium: 8.7 mg/dL — ABNORMAL LOW (ref 8.9–10.3)
Chloride: 103 mmol/L (ref 98–111)
Creatinine, Ser: 1.04 mg/dL — ABNORMAL HIGH (ref 0.44–1.00)
GFR, Estimated: >60 mL/min (ref >60)
Glucose, Bld: 102 mg/dL — ABNORMAL HIGH (ref 70–99)
Potassium: 3.4 mmol/L — ABNORMAL LOW (ref 3.5–5.1)
Sodium: 140 mmol/L (ref 135–145)
Total Bilirubin: 1.2 mg/dL (ref 0.0–1.2)
Total Protein: 6.6 g/dL (ref 6.5–8.1)

## 2024-02-29 LAB — DIFFERENTIAL
Abs Immature Granulocytes: 0.03 K/uL (ref 0.00–0.07)
Basophils Absolute: 0.1 K/uL (ref 0.0–0.1)
Basophils Relative: 1 %
Eosinophils Absolute: 0.2 K/uL (ref 0.0–0.5)
Eosinophils Relative: 2 %
Immature Granulocytes: 0 %
Lymphocytes Relative: 50 %
Lymphs Abs: 4.8 K/uL — ABNORMAL HIGH (ref 0.7–4.0)
Monocytes Absolute: 0.6 K/uL (ref 0.1–1.0)
Monocytes Relative: 6 %
Neutro Abs: 3.9 K/uL (ref 1.7–7.7)
Neutrophils Relative %: 41 %

## 2024-02-29 MED ORDER — FENTANYL CITRATE PF 50 MCG/ML IJ SOSY
50.0000 ug | PREFILLED_SYRINGE | Freq: Once | INTRAMUSCULAR | Status: AC
  Administered 2024-02-29: 50 ug via INTRAVENOUS
  Filled 2024-02-29: qty 1

## 2024-02-29 MED ORDER — KETOROLAC TROMETHAMINE 15 MG/ML IJ SOLN
15.0000 mg | Freq: Once | INTRAMUSCULAR | Status: AC
  Administered 2024-02-29: 15 mg via INTRAVENOUS
  Filled 2024-02-29: qty 1

## 2024-02-29 MED ORDER — PROCHLORPERAZINE EDISYLATE 10 MG/2ML IJ SOLN
10.0000 mg | Freq: Once | INTRAMUSCULAR | Status: AC
  Administered 2024-02-29: 10 mg via INTRAVENOUS
  Filled 2024-02-29: qty 2

## 2024-02-29 MED ORDER — METOCLOPRAMIDE HCL 5 MG/ML IJ SOLN
10.0000 mg | Freq: Once | INTRAMUSCULAR | Status: AC
  Administered 2024-02-29: 10 mg via INTRAVENOUS
  Filled 2024-02-29: qty 2

## 2024-02-29 NOTE — ED Provider Notes (Signed)
 [place] EMERGENCY DEPARTMENT AT [facility] Provider Note   CSN: [id number] Arrival date & time: 02/29/24  2102     Patient presents with: Numbness   Brianna Roberts is a 58 y.o. female.   HPI   Patient has a history of anxiety depression hypertension thyroid  disease degenerative disc disease hypothyroidism HIV TIA fibromyalgia.  Patient presents to the ED with complaints of numbness on the left side of her face.  Patient also reports some numbness in the left hand.  Symptoms ongoing for 2 days.  Patient reports a pins and needle type sensation primarily around her mouth on the left side.  Prior to Admission medications   Medication Sig Start Date End Date Taking? Authorizing Provider  albuterol  (VENTOLIN  HFA) 108 (90 Base) MCG/ACT inhaler Inhale 1-2 puffs into the lungs every 6 (six) hours as needed for wheezing or shortness of breath. 01/23/23   Raford Lenis, MD  amitriptyline  (ELAVIL ) 100 MG tablet Take 1 tablet (100 mg total) by mouth at bedtime. 08/02/23   Masters, Katie, DO  amLODipine  (NORVASC ) 10 MG tablet Take 1 tablet (10 mg total) by mouth daily. 03/26/23   Jolaine Pac, DO  Blood Glucose Monitoring Suppl (ONETOUCH VERIO) w/Device KIT 1 each by Does not apply route as needed. 03/28/23   Jolaine Pac, DO  clindamycin (CLINDAGEL) 1 % gel Apply 1 Application topically 2 (two) times daily. 12/29/23   [provider]  diazepam  (VALIUM ) 2 MG tablet Take 2 mg by mouth 2 (two) times daily. 12/29/23   [provider]  diazepam  (VALIUM ) 5 MG tablet Take 1 tablet (5 mg total) by mouth every 8 (eight) hours as needed for anxiety. 09/03/23   Marylu Gee, DO  fluticasone  (FLONASE ) 50 MCG/ACT nasal spray Place 1 spray into both nostrils daily. 03/26/23   Jolaine Pac, DO  Fluticasone -Umeclidin-Vilant (TRELEGY ELLIPTA ) 200-62.5-25 MCG/ACT AEPB Inhale 1 Inhalation into the lungs daily at 6 (six) AM. 08/30/23   Marylu Gee, DO  furosemide  (LASIX ) 40 MG tablet Take  1 tablet (40 mg total) by mouth 2 (two) times daily. 03/26/23   Jolaine Pac, DO  glucose blood test strip Use as instructed to check blood sugar three times daily 04/01/23   Gabino Boga, MD  hydrOXYzine  (ATARAX ) 10 MG tablet Take 10 mg by mouth 2 (two) times daily. 12/28/23   [provider]  Lancets Thin MISC Use to check home glucose three times daily 04/01/23   Gabino Boga, MD  levothyroxine  (SYNTHROID ) 50 MCG tablet Take 1 tablet (50 mcg total) by mouth daily before breakfast. 03/06/23   Tobie Gaines, DO  meclizine  (ANTIVERT ) 25 MG tablet Take 1 tablet (25 mg total) by mouth 3 (three) times daily as needed for dizziness. 08/04/23   Ruthell Lonni FALCON, PA-C  meloxicam  (MOBIC ) 7.5 MG tablet TAKE 2 TABLETS BY MOUTH IN THE MORNING AND AT BEDTIME 11/25/23   Marylu Gee, DO  metoprolol  tartrate (LOPRESSOR ) 100 MG tablet Take 1 tablet (100 mg total) by mouth 2 (two) times daily. 03/26/23   Jolaine Pac, DO  Nerve Stimulator (PRO COMFORT TENS UNIT) DEVI 1 Device by Does not apply route daily as needed. 12/27/16   Juliane Che, PA  nystatin  cream (MYCOSTATIN ) Apply 1 Application topically 2 (two) times daily. 03/26/23   Jolaine Pac, DO  ondansetron  (ZOFRAN -ODT) 8 MG disintegrating tablet Take 1 tablet (8 mg total) by mouth every 8 (eight) hours as needed for nausea or vomiting. 01/25/23   Tobie Gaines, DO  oxyCODONE -acetaminophen  (  PERCOCET) 10-325 MG tablet Take 1 tablet by mouth daily as needed. 12/29/23   [provider]  potassium chloride  (KLOR-CON  M) 10 MEQ tablet Take 1 tablet (10 mEq total) by mouth 2 (two) times daily. 09/02/23   Marylu Gee, DO  triamcinolone  (KENALOG ) 0.025 % ointment Apply 1 Application topically 2 (two) times daily. 12/10/23   Burnette, Jennifer M, PA-C  Triamcinolone  Acetonide (TRIAMCINOLONE  0.1 % CREAM : EUCERIN) CREA Apply 1 Application topically daily as needed. 09/03/23   Marylu Gee, DO  valACYclovir  (VALTREX ) 1000 MG tablet Take 1 tablet  (1,000 mg total) by mouth daily. 03/26/23   Jolaine Pac, DO    Allergies: Hydromorphone, Iodinated contrast media, Sulfa antibiotics, Codeine, Nickel, Penicillins, Spironolactone, Cefdinir, and Adhesive [tape]    Review of Systems  Updated Vital Signs BP (!) 108/54 (BP Location: Right Arm)   Pulse 76   Temp 97.9 F (36.6 C) (Oral)   Resp 14   Ht 1.753 m (5' 9)   Wt (!) 163.4 kg   SpO2 100%   BMI 53.20 kg/m   Physical Exam Vitals and nursing note reviewed.  Constitutional:      General: She is not in acute distress.    Appearance: She is well-developed.  HENT:     Head: Normocephalic and atraumatic.     Right Ear: External ear normal.     Left Ear: External ear normal.  Eyes:     General: No visual field deficit or scleral icterus.       Right eye: No discharge.        Left eye: No discharge.     Conjunctiva/sclera: Conjunctivae normal.  Neck:     Trachea: No tracheal deviation.  Cardiovascular:     Rate and Rhythm: Normal rate and regular rhythm.  Pulmonary:     Effort: Pulmonary effort is normal. No respiratory distress.     Breath sounds: Normal breath sounds. No stridor. No wheezing or rales.  Abdominal:     General: Bowel sounds are normal. There is no distension.     Palpations: Abdomen is soft.     Tenderness: There is no abdominal tenderness. There is no guarding or rebound.  Musculoskeletal:        General: No tenderness.     Cervical back: Neck supple.  Skin:    General: Skin is warm and dry.     Findings: No rash.  Neurological:     Mental Status: She is alert and oriented to person, place, and time.     Cranial Nerves: No cranial nerve deficit, dysarthria or facial asymmetry.     Sensory: No sensory deficit.     Motor: No abnormal muscle tone, seizure activity or pronator drift.     Coordination: Coordination normal.     Comments:  able to hold both legs off bed for 5 seconds, sensation intact in all extremities,  no left or right sided neglect,  normal finger-nose exam bilaterally, no nystagmus noted   Psychiatric:        Mood and Affect: Mood normal.     (all labs ordered are listed, but only abnormal results are displayed) Labs Reviewed  DIFFERENTIAL - Abnormal; Notable for the following components:      Result Value   Lymphs Abs 4.8 (*)    All other components within normal limits  COMPREHENSIVE METABOLIC PANEL WITH GFR - Abnormal; Notable for the following components:   Potassium 3.4 (*)    Glucose, Bld 102 (*)  Creatinine, Ser 1.04 (*)    Calcium 8.7 (*)    All other components within normal limits  CBC  I-STAT CHEM 8, ED    EKG: EKG Interpretation Date/Time:  Saturday February 29 2024 21:11:11 EDT Ventricular Rate:  76 PR Interval:  158 QRS Duration:  101 QT Interval:  436 QTC Calculation: 491 R Axis:   39  Text Interpretation: Sinus rhythm Borderline T abnormalities, anterior leads Borderline prolonged QT interval No significant change since last tracing Confirmed by Randol Simmonds [phone]) on 02/29/2024 9:15:48 PM  Radiology: CT HEAD WO CONTRAST Result Date: 02/29/2024 CLINICAL DATA:  Facial numbness, left arm numbness EXAM: CT HEAD WITHOUT CONTRAST TECHNIQUE: Contiguous axial images were obtained from the base of the skull through the vertex without intravenous contrast. RADIATION DOSE REDUCTION: This exam was performed according to the departmental dose-optimization program which includes automated exposure control, adjustment of the mA and/or kV according to patient size and/or use of iterative reconstruction technique. COMPARISON:  07/17/2022 FINDINGS: Brain: No acute infarct or hemorrhage. Lateral ventricles and midline structures are unremarkable. No acute extra-axial fluid collections. No mass effect. Vascular: No hyperdense vessel or unexpected calcification. Skull: Normal. Negative for fracture or focal lesion. Sinuses/Orbits: No acute finding. Other: None. IMPRESSION: 1. No acute intracranial process.  Electronically Signed   By: Ozell Daring M.D.   On: 02/29/2024 22:10     Procedures   Medications Ordered in the ED  prochlorperazine  (COMPAZINE ) injection 10 mg (has no administration in time range)  fentaNYL  (SUBLIMAZE ) injection 50 mcg (has no administration in time range)  metoCLOPramide  (REGLAN ) injection 10 mg (10 mg Intravenous Given 02/29/24 2202)  ketorolac  (TORADOL ) 15 MG/ML injection 15 mg (15 mg Intravenous Given 02/29/24 2202)    Clinical Course as of 02/29/24 2255  Sat Feb 29, 2024  2251 Head CT without acute abnormality.  CBC and metabolic panel unremarkable. [JK]    Clinical Course User Index [JK] Randol Simmonds, MD                                 Medical Decision Making Amount and/or Complexity of Data Reviewed Labs: ordered. Radiology: ordered.  Risk Prescription drug management.   Patient presented with complaints of facial numbness.  Patient also reported some numbness in her hand.  On exam however she has no focal deficits.  Her sensation is intact.  Laboratory test and CT scan are reassuring.  Suspect symptoms are likely related to migraine at this time.  Discussed options of proceeding with MRI to evaluate for occult stroke not evident on CT scan.  Patient would prefer to try additional medication for headache and reassess.     Final diagnoses:  Paresthesias  Other migraine without status migrainosus, not intractable    ED Discharge Orders     None          Randol Simmonds, MD 02/29/24 2310

## 2024-02-29 NOTE — ED Triage Notes (Addendum)
 Pt here via [facility] from home for L hand numbness x 2 days and facial numbness since 11 am.  LVO negative per [facility].  Pt states the numbness started like pins and needs and now feels as if she has been given lidocaine.    Pt also c/o L frontal headache that radiates down the L side of her neck.  Bp 138/100 Hr 72 Sats 97% Cbg 117  PT took 2 asa at 1900

## 2024-03-01 MED ORDER — DIAZEPAM 5 MG/ML IJ SOLN: 5.0000 mg | Freq: Once | INTRAMUSCULAR | Status: DC

## 2024-03-01 NOTE — ED Provider Notes (Signed)
 I assumed care of this patient from previous provider.  Please see their note for further details of history, exam, and MDM.   Briefly patient is a 58 y.o. female who presented with left sided paresthesia and headache. Work up thus far is reassuring with a normal CT head.  Getting headache cocktail.  Plan to reassess and determine if patient needs an MRI to rule out stroke.   On reassessment, patient does feel better still having perioral numbness.  We discussed getting MRI the patient would like to defer.  The patient appears reasonably screened and/or stabilized for discharge and I doubt any other medical condition or other [employer] requiring further screening, evaluation, or treatment in the ED at this time. I have discussed the findings, Dx and Tx plan with the patient/family who expressed understanding and agree(s) with the plan. Discharge instructions discussed at length. The patient/family was given strict return precautions who verbalized understanding of the instructions. No further questions at time of discharge.  Disposition: Discharge  Condition: Good  ED Discharge Orders     None         Follow Up: Jerome Heron Ruth, PA-C [address] [place], [place] [address] [phone]  Call  to schedule an appointment for close follow up  [facility] [address]     [address] [place]  [address] [phone] Call           Trine Raynell Moder, MD 03/01/24 [phone]

## 2024-03-01 NOTE — Discharge Instructions (Signed)
 Thank you for allowing [place] to take care of you today.  We hope you begin feeling better soon.  To-Do: Please follow-up with your primary doctor. Please return to the Emergency Department or call 911 if you experience chest pain, shortness of breath, severe pain, severe fever, altered mental status, or have any reason to think that you need emergency medical care.  Thank you again.  Hope you feel better soon.  Department of Emergency Medicine [facility]

## 2024-03-05 DIAGNOSIS — F411 Generalized anxiety disorder: Secondary | ICD-10-CM | POA: Diagnosis not present

## 2024-03-05 DIAGNOSIS — F341 Dysthymic disorder: Secondary | ICD-10-CM | POA: Diagnosis not present

## 2024-03-09 DIAGNOSIS — F411 Generalized anxiety disorder: Secondary | ICD-10-CM | POA: Diagnosis not present

## 2024-03-09 DIAGNOSIS — F341 Dysthymic disorder: Secondary | ICD-10-CM | POA: Diagnosis not present

## 2024-03-16 DIAGNOSIS — F341 Dysthymic disorder: Secondary | ICD-10-CM | POA: Diagnosis not present

## 2024-03-16 DIAGNOSIS — F411 Generalized anxiety disorder: Secondary | ICD-10-CM | POA: Diagnosis not present

## 2024-03-17 DIAGNOSIS — K580 Irritable bowel syndrome with diarrhea: Secondary | ICD-10-CM | POA: Diagnosis not present

## 2024-03-17 DIAGNOSIS — M62838 Other muscle spasm: Secondary | ICD-10-CM | POA: Diagnosis not present

## 2024-03-17 DIAGNOSIS — N952 Postmenopausal atrophic vaginitis: Secondary | ICD-10-CM | POA: Diagnosis not present

## 2024-03-17 DIAGNOSIS — Z79899 Other long term (current) drug therapy: Secondary | ICD-10-CM | POA: Diagnosis not present

## 2024-03-17 DIAGNOSIS — R413 Other amnesia: Secondary | ICD-10-CM | POA: Diagnosis not present

## 2024-03-17 DIAGNOSIS — G4730 Sleep apnea, unspecified: Secondary | ICD-10-CM | POA: Diagnosis not present

## 2024-03-17 DIAGNOSIS — M1711 Unilateral primary osteoarthritis, right knee: Secondary | ICD-10-CM | POA: Diagnosis not present

## 2024-03-17 DIAGNOSIS — R399 Unspecified symptoms and signs involving the genitourinary system: Secondary | ICD-10-CM | POA: Diagnosis not present

## 2024-03-17 DIAGNOSIS — Z9181 History of falling: Secondary | ICD-10-CM | POA: Diagnosis not present

## 2024-03-17 DIAGNOSIS — R630 Anorexia: Secondary | ICD-10-CM | POA: Diagnosis not present

## 2024-03-17 DIAGNOSIS — E119 Type 2 diabetes mellitus without complications: Secondary | ICD-10-CM | POA: Diagnosis not present

## 2024-03-17 DIAGNOSIS — L732 Hidradenitis suppurativa: Secondary | ICD-10-CM | POA: Diagnosis not present

## 2024-03-19 ENCOUNTER — Other Ambulatory Visit: Payer: Self-pay | Admitting: Student

## 2024-03-19 DIAGNOSIS — L509 Urticaria, unspecified: Secondary | ICD-10-CM

## 2024-03-31 DIAGNOSIS — F341 Dysthymic disorder: Secondary | ICD-10-CM | POA: Diagnosis not present

## 2024-03-31 DIAGNOSIS — F411 Generalized anxiety disorder: Secondary | ICD-10-CM | POA: Diagnosis not present

## 2024-04-01 DIAGNOSIS — G4730 Sleep apnea, unspecified: Secondary | ICD-10-CM | POA: Diagnosis not present

## 2024-04-01 DIAGNOSIS — M170 Bilateral primary osteoarthritis of knee: Secondary | ICD-10-CM | POA: Diagnosis not present

## 2024-04-01 DIAGNOSIS — G43909 Migraine, unspecified, not intractable, without status migrainosus: Secondary | ICD-10-CM | POA: Diagnosis not present

## 2024-04-01 DIAGNOSIS — I110 Hypertensive heart disease with heart failure: Secondary | ICD-10-CM | POA: Diagnosis not present

## 2024-04-01 DIAGNOSIS — M160 Bilateral primary osteoarthritis of hip: Secondary | ICD-10-CM | POA: Diagnosis not present

## 2024-04-01 DIAGNOSIS — I509 Heart failure, unspecified: Secondary | ICD-10-CM | POA: Diagnosis not present

## 2024-04-01 DIAGNOSIS — R413 Other amnesia: Secondary | ICD-10-CM | POA: Diagnosis not present

## 2024-04-01 DIAGNOSIS — K580 Irritable bowel syndrome with diarrhea: Secondary | ICD-10-CM | POA: Diagnosis not present

## 2024-04-01 DIAGNOSIS — E669 Obesity, unspecified: Secondary | ICD-10-CM | POA: Diagnosis not present

## 2024-04-01 DIAGNOSIS — M47816 Spondylosis without myelopathy or radiculopathy, lumbar region: Secondary | ICD-10-CM | POA: Diagnosis not present

## 2024-04-01 DIAGNOSIS — F411 Generalized anxiety disorder: Secondary | ICD-10-CM | POA: Diagnosis not present

## 2024-04-01 DIAGNOSIS — Z6841 Body Mass Index (BMI) 40.0 and over, adult: Secondary | ICD-10-CM | POA: Diagnosis not present

## 2024-04-01 DIAGNOSIS — G8929 Other chronic pain: Secondary | ICD-10-CM | POA: Diagnosis not present

## 2024-04-01 DIAGNOSIS — N952 Postmenopausal atrophic vaginitis: Secondary | ICD-10-CM | POA: Diagnosis not present

## 2024-04-01 DIAGNOSIS — Z87440 Personal history of urinary (tract) infections: Secondary | ICD-10-CM | POA: Diagnosis not present

## 2024-04-01 DIAGNOSIS — M810 Age-related osteoporosis without current pathological fracture: Secondary | ICD-10-CM | POA: Diagnosis not present

## 2024-04-01 DIAGNOSIS — M62838 Other muscle spasm: Secondary | ICD-10-CM | POA: Diagnosis not present

## 2024-04-01 DIAGNOSIS — F32A Depression, unspecified: Secondary | ICD-10-CM | POA: Diagnosis not present

## 2024-04-01 DIAGNOSIS — L732 Hidradenitis suppurativa: Secondary | ICD-10-CM | POA: Diagnosis not present

## 2024-04-01 DIAGNOSIS — E119 Type 2 diabetes mellitus without complications: Secondary | ICD-10-CM | POA: Diagnosis not present

## 2024-04-01 DIAGNOSIS — Z7951 Long term (current) use of inhaled steroids: Secondary | ICD-10-CM | POA: Diagnosis not present

## 2024-04-01 DIAGNOSIS — F17210 Nicotine dependence, cigarettes, uncomplicated: Secondary | ICD-10-CM | POA: Diagnosis not present

## 2024-04-01 DIAGNOSIS — E781 Pure hyperglyceridemia: Secondary | ICD-10-CM | POA: Diagnosis not present

## 2024-04-01 DIAGNOSIS — J4489 Other specified chronic obstructive pulmonary disease: Secondary | ICD-10-CM | POA: Diagnosis not present

## 2024-04-01 DIAGNOSIS — R1310 Dysphagia, unspecified: Secondary | ICD-10-CM | POA: Diagnosis not present

## 2024-04-03 ENCOUNTER — Other Ambulatory Visit: Payer: Self-pay | Admitting: Student

## 2024-04-03 DIAGNOSIS — I10 Essential (primary) hypertension: Secondary | ICD-10-CM

## 2024-04-06 NOTE — Telephone Encounter (Signed)
 NO LONGER [facility] PATIENT

## 2024-04-07 DIAGNOSIS — K580 Irritable bowel syndrome with diarrhea: Secondary | ICD-10-CM | POA: Diagnosis not present

## 2024-04-07 DIAGNOSIS — G8929 Other chronic pain: Secondary | ICD-10-CM | POA: Diagnosis not present

## 2024-04-07 DIAGNOSIS — F17210 Nicotine dependence, cigarettes, uncomplicated: Secondary | ICD-10-CM | POA: Diagnosis not present

## 2024-04-07 DIAGNOSIS — I509 Heart failure, unspecified: Secondary | ICD-10-CM | POA: Diagnosis not present

## 2024-04-07 DIAGNOSIS — F411 Generalized anxiety disorder: Secondary | ICD-10-CM | POA: Diagnosis not present

## 2024-04-07 DIAGNOSIS — F32A Depression, unspecified: Secondary | ICD-10-CM | POA: Diagnosis not present

## 2024-04-07 DIAGNOSIS — M62838 Other muscle spasm: Secondary | ICD-10-CM | POA: Diagnosis not present

## 2024-04-07 DIAGNOSIS — Z6841 Body Mass Index (BMI) 40.0 and over, adult: Secondary | ICD-10-CM | POA: Diagnosis not present

## 2024-04-07 DIAGNOSIS — M47816 Spondylosis without myelopathy or radiculopathy, lumbar region: Secondary | ICD-10-CM | POA: Diagnosis not present

## 2024-04-07 DIAGNOSIS — N952 Postmenopausal atrophic vaginitis: Secondary | ICD-10-CM | POA: Diagnosis not present

## 2024-04-07 DIAGNOSIS — R413 Other amnesia: Secondary | ICD-10-CM | POA: Diagnosis not present

## 2024-04-07 DIAGNOSIS — I110 Hypertensive heart disease with heart failure: Secondary | ICD-10-CM | POA: Diagnosis not present

## 2024-04-07 DIAGNOSIS — G4730 Sleep apnea, unspecified: Secondary | ICD-10-CM | POA: Diagnosis not present

## 2024-04-07 DIAGNOSIS — E119 Type 2 diabetes mellitus without complications: Secondary | ICD-10-CM | POA: Diagnosis not present

## 2024-04-07 DIAGNOSIS — M160 Bilateral primary osteoarthritis of hip: Secondary | ICD-10-CM | POA: Diagnosis not present

## 2024-04-07 DIAGNOSIS — J4489 Other specified chronic obstructive pulmonary disease: Secondary | ICD-10-CM | POA: Diagnosis not present

## 2024-04-07 DIAGNOSIS — M170 Bilateral primary osteoarthritis of knee: Secondary | ICD-10-CM | POA: Diagnosis not present

## 2024-04-07 DIAGNOSIS — E781 Pure hyperglyceridemia: Secondary | ICD-10-CM | POA: Diagnosis not present

## 2024-04-07 DIAGNOSIS — Z87440 Personal history of urinary (tract) infections: Secondary | ICD-10-CM | POA: Diagnosis not present

## 2024-04-07 DIAGNOSIS — M810 Age-related osteoporosis without current pathological fracture: Secondary | ICD-10-CM | POA: Diagnosis not present

## 2024-04-07 DIAGNOSIS — E669 Obesity, unspecified: Secondary | ICD-10-CM | POA: Diagnosis not present

## 2024-04-07 DIAGNOSIS — G43909 Migraine, unspecified, not intractable, without status migrainosus: Secondary | ICD-10-CM | POA: Diagnosis not present

## 2024-04-07 DIAGNOSIS — Z7951 Long term (current) use of inhaled steroids: Secondary | ICD-10-CM | POA: Diagnosis not present

## 2024-04-07 DIAGNOSIS — R1310 Dysphagia, unspecified: Secondary | ICD-10-CM | POA: Diagnosis not present

## 2024-04-07 DIAGNOSIS — L732 Hidradenitis suppurativa: Secondary | ICD-10-CM | POA: Diagnosis not present

## 2024-04-09 DIAGNOSIS — G8929 Other chronic pain: Secondary | ICD-10-CM | POA: Diagnosis not present

## 2024-04-09 DIAGNOSIS — F17210 Nicotine dependence, cigarettes, uncomplicated: Secondary | ICD-10-CM | POA: Diagnosis not present

## 2024-04-09 DIAGNOSIS — G4730 Sleep apnea, unspecified: Secondary | ICD-10-CM | POA: Diagnosis not present

## 2024-04-09 DIAGNOSIS — M810 Age-related osteoporosis without current pathological fracture: Secondary | ICD-10-CM | POA: Diagnosis not present

## 2024-04-09 DIAGNOSIS — M160 Bilateral primary osteoarthritis of hip: Secondary | ICD-10-CM | POA: Diagnosis not present

## 2024-04-09 DIAGNOSIS — L732 Hidradenitis suppurativa: Secondary | ICD-10-CM | POA: Diagnosis not present

## 2024-04-09 DIAGNOSIS — M47816 Spondylosis without myelopathy or radiculopathy, lumbar region: Secondary | ICD-10-CM | POA: Diagnosis not present

## 2024-04-09 DIAGNOSIS — Z6841 Body Mass Index (BMI) 40.0 and over, adult: Secondary | ICD-10-CM | POA: Diagnosis not present

## 2024-04-09 DIAGNOSIS — F411 Generalized anxiety disorder: Secondary | ICD-10-CM | POA: Diagnosis not present

## 2024-04-09 DIAGNOSIS — J4489 Other specified chronic obstructive pulmonary disease: Secondary | ICD-10-CM | POA: Diagnosis not present

## 2024-04-09 DIAGNOSIS — M170 Bilateral primary osteoarthritis of knee: Secondary | ICD-10-CM | POA: Diagnosis not present

## 2024-04-09 DIAGNOSIS — E119 Type 2 diabetes mellitus without complications: Secondary | ICD-10-CM | POA: Diagnosis not present

## 2024-04-09 DIAGNOSIS — R413 Other amnesia: Secondary | ICD-10-CM | POA: Diagnosis not present

## 2024-04-09 DIAGNOSIS — F341 Dysthymic disorder: Secondary | ICD-10-CM | POA: Diagnosis not present

## 2024-04-09 DIAGNOSIS — Z7951 Long term (current) use of inhaled steroids: Secondary | ICD-10-CM | POA: Diagnosis not present

## 2024-04-09 DIAGNOSIS — G43909 Migraine, unspecified, not intractable, without status migrainosus: Secondary | ICD-10-CM | POA: Diagnosis not present

## 2024-04-09 DIAGNOSIS — F32A Depression, unspecified: Secondary | ICD-10-CM | POA: Diagnosis not present

## 2024-04-09 DIAGNOSIS — I110 Hypertensive heart disease with heart failure: Secondary | ICD-10-CM | POA: Diagnosis not present

## 2024-04-09 DIAGNOSIS — Z87440 Personal history of urinary (tract) infections: Secondary | ICD-10-CM | POA: Diagnosis not present

## 2024-04-09 DIAGNOSIS — I509 Heart failure, unspecified: Secondary | ICD-10-CM | POA: Diagnosis not present

## 2024-04-09 DIAGNOSIS — R1310 Dysphagia, unspecified: Secondary | ICD-10-CM | POA: Diagnosis not present

## 2024-04-09 DIAGNOSIS — E781 Pure hyperglyceridemia: Secondary | ICD-10-CM | POA: Diagnosis not present

## 2024-04-09 DIAGNOSIS — K580 Irritable bowel syndrome with diarrhea: Secondary | ICD-10-CM | POA: Diagnosis not present

## 2024-04-09 DIAGNOSIS — M62838 Other muscle spasm: Secondary | ICD-10-CM | POA: Diagnosis not present

## 2024-04-09 DIAGNOSIS — N952 Postmenopausal atrophic vaginitis: Secondary | ICD-10-CM | POA: Diagnosis not present

## 2024-04-09 DIAGNOSIS — E669 Obesity, unspecified: Secondary | ICD-10-CM | POA: Diagnosis not present

## 2024-04-13 DIAGNOSIS — E781 Pure hyperglyceridemia: Secondary | ICD-10-CM | POA: Diagnosis not present

## 2024-04-13 DIAGNOSIS — E119 Type 2 diabetes mellitus without complications: Secondary | ICD-10-CM | POA: Diagnosis not present

## 2024-04-13 DIAGNOSIS — J4489 Other specified chronic obstructive pulmonary disease: Secondary | ICD-10-CM | POA: Diagnosis not present

## 2024-04-13 DIAGNOSIS — I509 Heart failure, unspecified: Secondary | ICD-10-CM | POA: Diagnosis not present

## 2024-04-13 DIAGNOSIS — M47816 Spondylosis without myelopathy or radiculopathy, lumbar region: Secondary | ICD-10-CM | POA: Diagnosis not present

## 2024-04-13 DIAGNOSIS — M810 Age-related osteoporosis without current pathological fracture: Secondary | ICD-10-CM | POA: Diagnosis not present

## 2024-04-13 DIAGNOSIS — N952 Postmenopausal atrophic vaginitis: Secondary | ICD-10-CM | POA: Diagnosis not present

## 2024-04-13 DIAGNOSIS — M170 Bilateral primary osteoarthritis of knee: Secondary | ICD-10-CM | POA: Diagnosis not present

## 2024-04-13 DIAGNOSIS — F32A Depression, unspecified: Secondary | ICD-10-CM | POA: Diagnosis not present

## 2024-04-13 DIAGNOSIS — R413 Other amnesia: Secondary | ICD-10-CM | POA: Diagnosis not present

## 2024-04-13 DIAGNOSIS — E669 Obesity, unspecified: Secondary | ICD-10-CM | POA: Diagnosis not present

## 2024-04-13 DIAGNOSIS — Z6841 Body Mass Index (BMI) 40.0 and over, adult: Secondary | ICD-10-CM | POA: Diagnosis not present

## 2024-04-13 DIAGNOSIS — G8929 Other chronic pain: Secondary | ICD-10-CM | POA: Diagnosis not present

## 2024-04-13 DIAGNOSIS — M160 Bilateral primary osteoarthritis of hip: Secondary | ICD-10-CM | POA: Diagnosis not present

## 2024-04-13 DIAGNOSIS — G43909 Migraine, unspecified, not intractable, without status migrainosus: Secondary | ICD-10-CM | POA: Diagnosis not present

## 2024-04-13 DIAGNOSIS — L732 Hidradenitis suppurativa: Secondary | ICD-10-CM | POA: Diagnosis not present

## 2024-04-13 DIAGNOSIS — G4730 Sleep apnea, unspecified: Secondary | ICD-10-CM | POA: Diagnosis not present

## 2024-04-13 DIAGNOSIS — I110 Hypertensive heart disease with heart failure: Secondary | ICD-10-CM | POA: Diagnosis not present

## 2024-04-13 DIAGNOSIS — F341 Dysthymic disorder: Secondary | ICD-10-CM | POA: Diagnosis not present

## 2024-04-13 DIAGNOSIS — F411 Generalized anxiety disorder: Secondary | ICD-10-CM | POA: Diagnosis not present

## 2024-04-13 DIAGNOSIS — K580 Irritable bowel syndrome with diarrhea: Secondary | ICD-10-CM | POA: Diagnosis not present

## 2024-04-13 DIAGNOSIS — Z7951 Long term (current) use of inhaled steroids: Secondary | ICD-10-CM | POA: Diagnosis not present

## 2024-04-13 DIAGNOSIS — R1310 Dysphagia, unspecified: Secondary | ICD-10-CM | POA: Diagnosis not present

## 2024-04-13 DIAGNOSIS — Z87440 Personal history of urinary (tract) infections: Secondary | ICD-10-CM | POA: Diagnosis not present

## 2024-04-13 DIAGNOSIS — M62838 Other muscle spasm: Secondary | ICD-10-CM | POA: Diagnosis not present

## 2024-04-13 DIAGNOSIS — F17210 Nicotine dependence, cigarettes, uncomplicated: Secondary | ICD-10-CM | POA: Diagnosis not present

## 2024-04-20 DIAGNOSIS — R1310 Dysphagia, unspecified: Secondary | ICD-10-CM | POA: Diagnosis not present

## 2024-04-20 DIAGNOSIS — F341 Dysthymic disorder: Secondary | ICD-10-CM | POA: Diagnosis not present

## 2024-04-20 DIAGNOSIS — K580 Irritable bowel syndrome with diarrhea: Secondary | ICD-10-CM | POA: Diagnosis not present

## 2024-04-20 DIAGNOSIS — E781 Pure hyperglyceridemia: Secondary | ICD-10-CM | POA: Diagnosis not present

## 2024-04-20 DIAGNOSIS — M47816 Spondylosis without myelopathy or radiculopathy, lumbar region: Secondary | ICD-10-CM | POA: Diagnosis not present

## 2024-04-20 DIAGNOSIS — Z87440 Personal history of urinary (tract) infections: Secondary | ICD-10-CM | POA: Diagnosis not present

## 2024-04-20 DIAGNOSIS — N952 Postmenopausal atrophic vaginitis: Secondary | ICD-10-CM | POA: Diagnosis not present

## 2024-04-20 DIAGNOSIS — M170 Bilateral primary osteoarthritis of knee: Secondary | ICD-10-CM | POA: Diagnosis not present

## 2024-04-20 DIAGNOSIS — Z6841 Body Mass Index (BMI) 40.0 and over, adult: Secondary | ICD-10-CM | POA: Diagnosis not present

## 2024-04-20 DIAGNOSIS — Z7951 Long term (current) use of inhaled steroids: Secondary | ICD-10-CM | POA: Diagnosis not present

## 2024-04-20 DIAGNOSIS — G43909 Migraine, unspecified, not intractable, without status migrainosus: Secondary | ICD-10-CM | POA: Diagnosis not present

## 2024-04-20 DIAGNOSIS — R413 Other amnesia: Secondary | ICD-10-CM | POA: Diagnosis not present

## 2024-04-20 DIAGNOSIS — M810 Age-related osteoporosis without current pathological fracture: Secondary | ICD-10-CM | POA: Diagnosis not present

## 2024-04-20 DIAGNOSIS — J4489 Other specified chronic obstructive pulmonary disease: Secondary | ICD-10-CM | POA: Diagnosis not present

## 2024-04-20 DIAGNOSIS — L732 Hidradenitis suppurativa: Secondary | ICD-10-CM | POA: Diagnosis not present

## 2024-04-20 DIAGNOSIS — F32A Depression, unspecified: Secondary | ICD-10-CM | POA: Diagnosis not present

## 2024-04-20 DIAGNOSIS — F17210 Nicotine dependence, cigarettes, uncomplicated: Secondary | ICD-10-CM | POA: Diagnosis not present

## 2024-04-20 DIAGNOSIS — E119 Type 2 diabetes mellitus without complications: Secondary | ICD-10-CM | POA: Diagnosis not present

## 2024-04-20 DIAGNOSIS — I509 Heart failure, unspecified: Secondary | ICD-10-CM | POA: Diagnosis not present

## 2024-04-20 DIAGNOSIS — F411 Generalized anxiety disorder: Secondary | ICD-10-CM | POA: Diagnosis not present

## 2024-04-20 DIAGNOSIS — E669 Obesity, unspecified: Secondary | ICD-10-CM | POA: Diagnosis not present

## 2024-04-20 DIAGNOSIS — G4730 Sleep apnea, unspecified: Secondary | ICD-10-CM | POA: Diagnosis not present

## 2024-04-20 DIAGNOSIS — G8929 Other chronic pain: Secondary | ICD-10-CM | POA: Diagnosis not present

## 2024-04-20 DIAGNOSIS — M62838 Other muscle spasm: Secondary | ICD-10-CM | POA: Diagnosis not present

## 2024-04-20 DIAGNOSIS — M160 Bilateral primary osteoarthritis of hip: Secondary | ICD-10-CM | POA: Diagnosis not present

## 2024-04-20 DIAGNOSIS — I110 Hypertensive heart disease with heart failure: Secondary | ICD-10-CM | POA: Diagnosis not present

## 2024-04-21 DIAGNOSIS — E039 Hypothyroidism, unspecified: Secondary | ICD-10-CM | POA: Diagnosis not present

## 2024-04-21 DIAGNOSIS — Z1330 Encounter for screening examination for mental health and behavioral disorders, unspecified: Secondary | ICD-10-CM | POA: Diagnosis not present

## 2024-04-21 DIAGNOSIS — G8929 Other chronic pain: Secondary | ICD-10-CM | POA: Diagnosis not present

## 2024-04-21 DIAGNOSIS — L732 Hidradenitis suppurativa: Secondary | ICD-10-CM | POA: Diagnosis not present

## 2024-04-21 DIAGNOSIS — R0602 Shortness of breath: Secondary | ICD-10-CM | POA: Diagnosis not present

## 2024-04-21 DIAGNOSIS — F17210 Nicotine dependence, cigarettes, uncomplicated: Secondary | ICD-10-CM | POA: Diagnosis not present

## 2024-04-21 DIAGNOSIS — N952 Postmenopausal atrophic vaginitis: Secondary | ICD-10-CM | POA: Diagnosis not present

## 2024-04-21 DIAGNOSIS — Z87440 Personal history of urinary (tract) infections: Secondary | ICD-10-CM | POA: Diagnosis not present

## 2024-04-21 DIAGNOSIS — Z6841 Body Mass Index (BMI) 40.0 and over, adult: Secondary | ICD-10-CM | POA: Diagnosis not present

## 2024-04-21 DIAGNOSIS — E669 Obesity, unspecified: Secondary | ICD-10-CM | POA: Diagnosis not present

## 2024-04-21 DIAGNOSIS — J4489 Other specified chronic obstructive pulmonary disease: Secondary | ICD-10-CM | POA: Diagnosis not present

## 2024-04-21 DIAGNOSIS — M62838 Other muscle spasm: Secondary | ICD-10-CM | POA: Diagnosis not present

## 2024-04-21 DIAGNOSIS — G4730 Sleep apnea, unspecified: Secondary | ICD-10-CM | POA: Diagnosis not present

## 2024-04-21 DIAGNOSIS — I110 Hypertensive heart disease with heart failure: Secondary | ICD-10-CM | POA: Diagnosis not present

## 2024-04-21 DIAGNOSIS — I509 Heart failure, unspecified: Secondary | ICD-10-CM | POA: Diagnosis not present

## 2024-04-21 DIAGNOSIS — E119 Type 2 diabetes mellitus without complications: Secondary | ICD-10-CM | POA: Diagnosis not present

## 2024-04-21 DIAGNOSIS — R1310 Dysphagia, unspecified: Secondary | ICD-10-CM | POA: Diagnosis not present

## 2024-04-21 DIAGNOSIS — M47816 Spondylosis without myelopathy or radiculopathy, lumbar region: Secondary | ICD-10-CM | POA: Diagnosis not present

## 2024-04-21 DIAGNOSIS — M170 Bilateral primary osteoarthritis of knee: Secondary | ICD-10-CM | POA: Diagnosis not present

## 2024-04-21 DIAGNOSIS — E1169 Type 2 diabetes mellitus with other specified complication: Secondary | ICD-10-CM | POA: Diagnosis not present

## 2024-04-21 DIAGNOSIS — I152 Hypertension secondary to endocrine disorders: Secondary | ICD-10-CM | POA: Diagnosis not present

## 2024-04-21 DIAGNOSIS — K580 Irritable bowel syndrome with diarrhea: Secondary | ICD-10-CM | POA: Diagnosis not present

## 2024-04-21 DIAGNOSIS — J45909 Unspecified asthma, uncomplicated: Secondary | ICD-10-CM | POA: Diagnosis not present

## 2024-04-21 DIAGNOSIS — Z7951 Long term (current) use of inhaled steroids: Secondary | ICD-10-CM | POA: Diagnosis not present

## 2024-04-21 DIAGNOSIS — M160 Bilateral primary osteoarthritis of hip: Secondary | ICD-10-CM | POA: Diagnosis not present

## 2024-04-21 DIAGNOSIS — R39198 Other difficulties with micturition: Secondary | ICD-10-CM | POA: Diagnosis not present

## 2024-04-21 DIAGNOSIS — E1159 Type 2 diabetes mellitus with other circulatory complications: Secondary | ICD-10-CM | POA: Diagnosis not present

## 2024-04-21 DIAGNOSIS — R600 Localized edema: Secondary | ICD-10-CM | POA: Diagnosis not present

## 2024-04-21 DIAGNOSIS — G43909 Migraine, unspecified, not intractable, without status migrainosus: Secondary | ICD-10-CM | POA: Diagnosis not present

## 2024-04-21 DIAGNOSIS — N9089 Other specified noninflammatory disorders of vulva and perineum: Secondary | ICD-10-CM | POA: Diagnosis not present

## 2024-04-21 DIAGNOSIS — F411 Generalized anxiety disorder: Secondary | ICD-10-CM | POA: Diagnosis not present

## 2024-04-21 DIAGNOSIS — E781 Pure hyperglyceridemia: Secondary | ICD-10-CM | POA: Diagnosis not present

## 2024-04-21 DIAGNOSIS — F32A Depression, unspecified: Secondary | ICD-10-CM | POA: Diagnosis not present

## 2024-04-21 DIAGNOSIS — G894 Chronic pain syndrome: Secondary | ICD-10-CM | POA: Diagnosis not present

## 2024-04-21 DIAGNOSIS — M810 Age-related osteoporosis without current pathological fracture: Secondary | ICD-10-CM | POA: Diagnosis not present

## 2024-04-21 DIAGNOSIS — R413 Other amnesia: Secondary | ICD-10-CM | POA: Diagnosis not present

## 2024-04-23 DIAGNOSIS — Z87440 Personal history of urinary (tract) infections: Secondary | ICD-10-CM | POA: Diagnosis not present

## 2024-04-23 DIAGNOSIS — M62838 Other muscle spasm: Secondary | ICD-10-CM | POA: Diagnosis not present

## 2024-04-23 DIAGNOSIS — M810 Age-related osteoporosis without current pathological fracture: Secondary | ICD-10-CM | POA: Diagnosis not present

## 2024-04-23 DIAGNOSIS — E119 Type 2 diabetes mellitus without complications: Secondary | ICD-10-CM | POA: Diagnosis not present

## 2024-04-23 DIAGNOSIS — M170 Bilateral primary osteoarthritis of knee: Secondary | ICD-10-CM | POA: Diagnosis not present

## 2024-04-23 DIAGNOSIS — R1310 Dysphagia, unspecified: Secondary | ICD-10-CM | POA: Diagnosis not present

## 2024-04-23 DIAGNOSIS — F17210 Nicotine dependence, cigarettes, uncomplicated: Secondary | ICD-10-CM | POA: Diagnosis not present

## 2024-04-23 DIAGNOSIS — G43909 Migraine, unspecified, not intractable, without status migrainosus: Secondary | ICD-10-CM | POA: Diagnosis not present

## 2024-04-23 DIAGNOSIS — E669 Obesity, unspecified: Secondary | ICD-10-CM | POA: Diagnosis not present

## 2024-04-23 DIAGNOSIS — F32A Depression, unspecified: Secondary | ICD-10-CM | POA: Diagnosis not present

## 2024-04-23 DIAGNOSIS — F411 Generalized anxiety disorder: Secondary | ICD-10-CM | POA: Diagnosis not present

## 2024-04-23 DIAGNOSIS — N952 Postmenopausal atrophic vaginitis: Secondary | ICD-10-CM | POA: Diagnosis not present

## 2024-04-23 DIAGNOSIS — R413 Other amnesia: Secondary | ICD-10-CM | POA: Diagnosis not present

## 2024-04-23 DIAGNOSIS — J4489 Other specified chronic obstructive pulmonary disease: Secondary | ICD-10-CM | POA: Diagnosis not present

## 2024-04-23 DIAGNOSIS — I509 Heart failure, unspecified: Secondary | ICD-10-CM | POA: Diagnosis not present

## 2024-04-23 DIAGNOSIS — I110 Hypertensive heart disease with heart failure: Secondary | ICD-10-CM | POA: Diagnosis not present

## 2024-04-23 DIAGNOSIS — E781 Pure hyperglyceridemia: Secondary | ICD-10-CM | POA: Diagnosis not present

## 2024-04-23 DIAGNOSIS — Z6841 Body Mass Index (BMI) 40.0 and over, adult: Secondary | ICD-10-CM | POA: Diagnosis not present

## 2024-04-23 DIAGNOSIS — M47816 Spondylosis without myelopathy or radiculopathy, lumbar region: Secondary | ICD-10-CM | POA: Diagnosis not present

## 2024-04-23 DIAGNOSIS — G8929 Other chronic pain: Secondary | ICD-10-CM | POA: Diagnosis not present

## 2024-04-23 DIAGNOSIS — K580 Irritable bowel syndrome with diarrhea: Secondary | ICD-10-CM | POA: Diagnosis not present

## 2024-04-23 DIAGNOSIS — L732 Hidradenitis suppurativa: Secondary | ICD-10-CM | POA: Diagnosis not present

## 2024-04-23 DIAGNOSIS — G4730 Sleep apnea, unspecified: Secondary | ICD-10-CM | POA: Diagnosis not present

## 2024-04-23 DIAGNOSIS — Z7951 Long term (current) use of inhaled steroids: Secondary | ICD-10-CM | POA: Diagnosis not present

## 2024-04-23 DIAGNOSIS — M160 Bilateral primary osteoarthritis of hip: Secondary | ICD-10-CM | POA: Diagnosis not present

## 2024-04-27 DIAGNOSIS — F17210 Nicotine dependence, cigarettes, uncomplicated: Secondary | ICD-10-CM | POA: Diagnosis not present

## 2024-04-27 DIAGNOSIS — Z87440 Personal history of urinary (tract) infections: Secondary | ICD-10-CM | POA: Diagnosis not present

## 2024-04-27 DIAGNOSIS — M47816 Spondylosis without myelopathy or radiculopathy, lumbar region: Secondary | ICD-10-CM | POA: Diagnosis not present

## 2024-04-27 DIAGNOSIS — M810 Age-related osteoporosis without current pathological fracture: Secondary | ICD-10-CM | POA: Diagnosis not present

## 2024-04-27 DIAGNOSIS — M62838 Other muscle spasm: Secondary | ICD-10-CM | POA: Diagnosis not present

## 2024-04-27 DIAGNOSIS — E781 Pure hyperglyceridemia: Secondary | ICD-10-CM | POA: Diagnosis not present

## 2024-04-27 DIAGNOSIS — I509 Heart failure, unspecified: Secondary | ICD-10-CM | POA: Diagnosis not present

## 2024-04-27 DIAGNOSIS — F32A Depression, unspecified: Secondary | ICD-10-CM | POA: Diagnosis not present

## 2024-04-27 DIAGNOSIS — K580 Irritable bowel syndrome with diarrhea: Secondary | ICD-10-CM | POA: Diagnosis not present

## 2024-04-27 DIAGNOSIS — N952 Postmenopausal atrophic vaginitis: Secondary | ICD-10-CM | POA: Diagnosis not present

## 2024-04-27 DIAGNOSIS — Z7951 Long term (current) use of inhaled steroids: Secondary | ICD-10-CM | POA: Diagnosis not present

## 2024-04-27 DIAGNOSIS — F411 Generalized anxiety disorder: Secondary | ICD-10-CM | POA: Diagnosis not present

## 2024-04-27 DIAGNOSIS — R1310 Dysphagia, unspecified: Secondary | ICD-10-CM | POA: Diagnosis not present

## 2024-04-27 DIAGNOSIS — G8929 Other chronic pain: Secondary | ICD-10-CM | POA: Diagnosis not present

## 2024-04-27 DIAGNOSIS — G4730 Sleep apnea, unspecified: Secondary | ICD-10-CM | POA: Diagnosis not present

## 2024-04-27 DIAGNOSIS — Z6841 Body Mass Index (BMI) 40.0 and over, adult: Secondary | ICD-10-CM | POA: Diagnosis not present

## 2024-04-27 DIAGNOSIS — G43909 Migraine, unspecified, not intractable, without status migrainosus: Secondary | ICD-10-CM | POA: Diagnosis not present

## 2024-04-27 DIAGNOSIS — E669 Obesity, unspecified: Secondary | ICD-10-CM | POA: Diagnosis not present

## 2024-04-27 DIAGNOSIS — M170 Bilateral primary osteoarthritis of knee: Secondary | ICD-10-CM | POA: Diagnosis not present

## 2024-04-27 DIAGNOSIS — J4489 Other specified chronic obstructive pulmonary disease: Secondary | ICD-10-CM | POA: Diagnosis not present

## 2024-04-27 DIAGNOSIS — F341 Dysthymic disorder: Secondary | ICD-10-CM | POA: Diagnosis not present

## 2024-04-27 DIAGNOSIS — E119 Type 2 diabetes mellitus without complications: Secondary | ICD-10-CM | POA: Diagnosis not present

## 2024-04-27 DIAGNOSIS — R413 Other amnesia: Secondary | ICD-10-CM | POA: Diagnosis not present

## 2024-04-27 DIAGNOSIS — L732 Hidradenitis suppurativa: Secondary | ICD-10-CM | POA: Diagnosis not present

## 2024-04-27 DIAGNOSIS — M160 Bilateral primary osteoarthritis of hip: Secondary | ICD-10-CM | POA: Diagnosis not present

## 2024-04-27 DIAGNOSIS — I110 Hypertensive heart disease with heart failure: Secondary | ICD-10-CM | POA: Diagnosis not present

## 2024-04-29 DIAGNOSIS — G8929 Other chronic pain: Secondary | ICD-10-CM | POA: Diagnosis not present

## 2024-04-29 DIAGNOSIS — G43909 Migraine, unspecified, not intractable, without status migrainosus: Secondary | ICD-10-CM | POA: Diagnosis not present

## 2024-04-29 DIAGNOSIS — F411 Generalized anxiety disorder: Secondary | ICD-10-CM | POA: Diagnosis not present

## 2024-04-29 DIAGNOSIS — K580 Irritable bowel syndrome with diarrhea: Secondary | ICD-10-CM | POA: Diagnosis not present

## 2024-04-29 DIAGNOSIS — F17210 Nicotine dependence, cigarettes, uncomplicated: Secondary | ICD-10-CM | POA: Diagnosis not present

## 2024-04-29 DIAGNOSIS — M47816 Spondylosis without myelopathy or radiculopathy, lumbar region: Secondary | ICD-10-CM | POA: Diagnosis not present

## 2024-04-29 DIAGNOSIS — L732 Hidradenitis suppurativa: Secondary | ICD-10-CM | POA: Diagnosis not present

## 2024-04-29 DIAGNOSIS — Z87440 Personal history of urinary (tract) infections: Secondary | ICD-10-CM | POA: Diagnosis not present

## 2024-04-29 DIAGNOSIS — E781 Pure hyperglyceridemia: Secondary | ICD-10-CM | POA: Diagnosis not present

## 2024-04-29 DIAGNOSIS — M160 Bilateral primary osteoarthritis of hip: Secondary | ICD-10-CM | POA: Diagnosis not present

## 2024-04-29 DIAGNOSIS — J4489 Other specified chronic obstructive pulmonary disease: Secondary | ICD-10-CM | POA: Diagnosis not present

## 2024-04-29 DIAGNOSIS — R1310 Dysphagia, unspecified: Secondary | ICD-10-CM | POA: Diagnosis not present

## 2024-04-29 DIAGNOSIS — I509 Heart failure, unspecified: Secondary | ICD-10-CM | POA: Diagnosis not present

## 2024-04-29 DIAGNOSIS — Z6841 Body Mass Index (BMI) 40.0 and over, adult: Secondary | ICD-10-CM | POA: Diagnosis not present

## 2024-04-29 DIAGNOSIS — E119 Type 2 diabetes mellitus without complications: Secondary | ICD-10-CM | POA: Diagnosis not present

## 2024-04-29 DIAGNOSIS — R413 Other amnesia: Secondary | ICD-10-CM | POA: Diagnosis not present

## 2024-04-29 DIAGNOSIS — G4730 Sleep apnea, unspecified: Secondary | ICD-10-CM | POA: Diagnosis not present

## 2024-04-29 DIAGNOSIS — M810 Age-related osteoporosis without current pathological fracture: Secondary | ICD-10-CM | POA: Diagnosis not present

## 2024-04-29 DIAGNOSIS — F32A Depression, unspecified: Secondary | ICD-10-CM | POA: Diagnosis not present

## 2024-04-29 DIAGNOSIS — M62838 Other muscle spasm: Secondary | ICD-10-CM | POA: Diagnosis not present

## 2024-04-29 DIAGNOSIS — M170 Bilateral primary osteoarthritis of knee: Secondary | ICD-10-CM | POA: Diagnosis not present

## 2024-04-29 DIAGNOSIS — I110 Hypertensive heart disease with heart failure: Secondary | ICD-10-CM | POA: Diagnosis not present

## 2024-04-29 DIAGNOSIS — Z7951 Long term (current) use of inhaled steroids: Secondary | ICD-10-CM | POA: Diagnosis not present

## 2024-04-29 DIAGNOSIS — N952 Postmenopausal atrophic vaginitis: Secondary | ICD-10-CM | POA: Diagnosis not present

## 2024-04-30 DIAGNOSIS — R0989 Other specified symptoms and signs involving the circulatory and respiratory systems: Secondary | ICD-10-CM | POA: Diagnosis not present

## 2024-04-30 DIAGNOSIS — F341 Dysthymic disorder: Secondary | ICD-10-CM | POA: Diagnosis not present

## 2024-04-30 DIAGNOSIS — R109 Unspecified abdominal pain: Secondary | ICD-10-CM | POA: Diagnosis not present

## 2024-04-30 DIAGNOSIS — R0602 Shortness of breath: Secondary | ICD-10-CM | POA: Diagnosis not present

## 2024-04-30 DIAGNOSIS — M79651 Pain in right thigh: Secondary | ICD-10-CM | POA: Diagnosis not present

## 2024-04-30 NOTE — Progress Notes (Addendum)
 04/30/2024  [facility]  Patient ID:  Brianna Roberts is a 58 y.o. (DOB 09/25/1965) female.    AI technology was used in creating this visit note. Verbal consent from the patient/caregiver was obtained prior to its use.    Assessment and Plan  Kaytelyn was seen today for abdominal pain.  Diagnoses and all orders for this visit:  Right sided abdominal pain -     CT Abdomen Pelvis WO IV Contrast; Future -     CT Femur Right  WO Contrast; Future -     ketoROLAC  tromethamine  (TORADOL ) injection 60 mg - Patient declines CT scan to be scheduled for today, would like to wait until tomorrow. Office staff assisting with scheduling.  -Educated patient on importance of seeking emergent care if symptoms continued to worsen or failed to improve.  Right thigh pain -     CT Abdomen Pelvis WO IV Contrast; Future -     CT Femur Right  WO Contrast; Future -     ketoROLAC  tromethamine  (TORADOL ) injection 60 mg  Shortness of breath -     BNP (B-Type Natriuretic Peptide); Future -     BNP (B-Type Natriuretic Peptide)  Suspected CHF (congestive heart failure) -     BNP (B-Type Natriuretic Peptide); Future -     BNP (B-Type Natriuretic Peptide) Assessment & Plan 1. Abdominal pain and swelling: - Abdominal pain and swelling have progressively worsened since 03/2024. The pain is primarily in the groin area and exacerbated by movement. - A CT scan of the abdomen and pelvis without contrast will be ordered to investigate the cause of the pain and swelling. Additionally, a CT scan of the femur without contrast will be ordered to assess the leg pain and swelling - The patient will be referred to the lymphedema clinic for further evaluation if necessary.  2. Groin pain: - Severe groin pain initially subsided after urination but has since become constant and intense. - An ultrasound previously ruled out a blood clot. - A CT scan of the abdomen and pelvis without contrast will be  ordered to further investigate the cause of the groin pain.  3. Weight management: - The patient's weight has fluctuated significantly, raising concerns about potential underlying conditions such as congestive heart failure. - A BNP test will be conducted today to assess cardiac function.  4. Medication management: - A prescription for meloxicam  has been sent to the pharmacy.  Follow-up: The patient will return on 05/22/2024 for further evaluation.  Follow up if symptoms worsen or fail to improve.   Risks, benefits, and alternatives of the medications and treatment plan prescribed today were discussed, and patient expressed understanding. Plan follow-up as discussed or as needed if any worsening symptoms or change in condition.    A yearly preventative health exam was recommended and current age based recommendations were discussed.   Subjective   Patient ID:  Kashmere Daywalt is a 58 y.o. (DOB [birth year]) female    Patient presents with  . Abdominal Pain    Ongoing for a while, swelling also     History of Present Illness The patient presents for evaluation of abdominal pain and swelling, groin pain, weight management, and medication management.  She reports a significant increase in abdominal pain, describing it as awful. The pain has been progressive over the past few years, with the right side appearing larger than usual. She first noticed the swelling on 04/07/2024, a week prior to her departure. During a  home physical therapy session yesterday morning, the swelling in her abdomen was noted to be worsening. An ultrasound performed at [facility] post-flight revealed no clots. She has a history of urinary retention dating back to 2019, which has been progressively worsening. She has passed kidney stones and experienced hematuria. She sought urological care when these symptoms first appeared.   She experiences groin pain, which initially subsided upon bladder emptying but has since  intensified and become constant. She has a long-standing rash that occasionally flares up but never completely resolves. She has a history of skin issues and retains her ovaries and tubes. She has had an ovarian cyst for several years but is unaware of its current size due to lack of gynecological care following a hysterectomy.  She expresses concern about her fluctuating weight. On 04/21/2024, she weighed 368 pounds, which decreased to 362 pounds three days later. On 04/14/2024, she weighed 358 pounds before traveling to [facility], and upon her return, her weight increased to 368 pounds. She is aware of the need to monitor her weight due to her congestive heart failure diagnosis.  Reports she is currently experiencing a fibromyalgia flare-up, resulting in widespread pain. She is scheduled to see a new pain management specialist tomorrow. She prefers meloxicam  for pain management and has requested a refill.   PAST SURGICAL HISTORY: - Hysterectomy - History of kidney stones   Current Outpatient Medications  Medication Instructions  . acetaminophen  (TYLENOL ) 650 mg, Every 6 hours as needed  . albuterol  sulfate HFA (PROVENTIL ,VENTOLIN ,PROAIR ) 108 (90 Base) MCG/ACT inhaler 2 puffs, Inhalation, Every 6 hours as needed  . amitriptyline  HCl (ELAVIL ) 100 mg tablet TAKE 1 TABLET BY MOUTH ONCE DAILY AT BEDTIME  . amLODIPine  besylate (NORVASC ) 10 mg, Oral, Daily  . aspirin  (ECOTRIN LOW DOSE) 81 mg, Daily  . azelastine  (ASTELIN ) 0.1 % nasal spray 2 sprays, Both Nostrils, Daily as needed  . Blood Glucose Monitoring Suppl (ONETOUCH VERIO FLEX SYSTEM) w/Device KIT USE TO CHECK BLOOD SUGAR AS DIRECTED  . budesonide -formoterol  (SYMBICORT ) 160-4.5 mcg/actuation inhaler 2 puffs, Inhalation, 2 times a day  . clobetasol (TEMOVATE) 0.05 % ointment APPLY  OINTMENT TOPICALLY TWICE DAILY. USE  IN  PLACE  OF  TRIAMCINOLONE   . cyanocobalamin  100 mcg, Oral, Daily  . diazepam  (VALIUM ) 5 mg tablet TAKE 1 TABLET BY MOUTH EVERY 12  HOURS AS NEEDED FOR ANXIETY OR SLEEP  . ergocalciferol (VITAMIN D2) 50,000 Units, Oral, Weekly  . erythromycin (ILOTYCIN) ophthalmic ointment SMARTSIG:In Eye(s)  . fluticasone  propionate (FLONASE ) 50 mcg/actuation nasal spray 1 spray, Both Nostrils, Daily  . furosemide  (LASIX ) 40 mg, Oral, 2 times a day  . hydrocortisone  (CORTAID) 1 % cream APPLY TOPICALLY TO AFFECTED AREA TWICE DAILY  . hydrOXYzine  HCl (ATARAX ) 10 mg tablet TAKE 1 TO 2 & 1/2 (TWO & ONE-HALF) TABLETS BY MOUTH EVERY 8 HOURS AS NEEDED FOR ITCHING  . Incontinence Supply Disposable (PREVAIL WASHCLOTHS) MISC Use daily as directed  . ipratropium-albuterol  (DUONEB) 0.5-2.5 mg/3 mL ML SOLN nebulizer solution 3 mLs, Inhalation, 4 times a day as needed  . levothyroxine  sodium (SYNTHROID ,LEVOTHROID,LOVOXYL) 50 mcg, Oral, Daily  . meloxicam  (MOBIC ) 15 mg, Oral, Daily as needed  . metoprolol  tartrate (LOPRESSOR ) 100 mg tablet TAKE 1 TABLET BY MOUTH TWICE DAILY . APPOINTMENT REQUIRED FOR FUTURE REFILLS  . Naloxone HCl 4 MG/0.1ML LIQD nasal spray 1 spray, Nasal, Once as needed  . Nerve Stimulator (PRO COMFORT TENS UNIT) DEVI Daily as needed  . nystatin  (MYCOSTATIN ) cream Topical, 2 times a day  .  nystatin  (MYCOSTATIN ) powder APPLY TOPICALLY TWICE DAILY  . ONETOUCH DELICA LANCETS 33G MISC E11.9 Use to test blood sugars 3 time daily. DAW  . oxyCODONE  HCl (OXYCONTIN ) 10 mg, Daily as needed  . potassium chloride  (K-DUR,KLOR-CON ) 20 mEq CR tablet 20 mEq, Oral, Daily  . triamcinolone  (KENALOG ) 0.5% ointment Topical, 2 times a day  . valacyclovir  (VALTREX ) 1,000 mg, Oral, Daily   Patient Care Team: Rosaline Viviane Maid, FNP as PCP - General (Family Medicine) Layman Lorrene Meissner, MD as Consulting Physician (Neurology) Onetha JINNY Larger, MD as Consulting Physician (Gastroenterology) Santana SAILOR Phlegar, NP as Nurse Practitioner (Family Medicine) Social History   Tobacco Use  . Smoking status: Every Day    Current packs/day: 0.25    Average  packs/day: 0.3 packs/day for 40.8 years (10.2 ttl pk-yrs)    Types: Cigarettes    Start date: 1985    Passive exposure: Never  . Smokeless tobacco: Never  Substance Use Topics  . Alcohol  use: Not Currently    Reviewed and updated this visit by provider: Tobacco  Allergies  Meds  Problems  Med Hx  Surg Hx  Fam Hx      Review of Systems  Cardiovascular:  Positive for leg swelling.  Gastrointestinal:  Positive for abdominal pain. Negative for constipation, diarrhea, nausea and vomiting.      Objective   Vitals:   04/30/24 1335  BP: 136/86  Patient Position: Sitting  Pulse: 80  Temp: 97.7 F (36.5 C)  TempSrc: Temporal  Resp: 18  Height: 5' 9 (1.753 m)  Weight: (!) 364 lb (165.1 kg)  SpO2: 98%  BMI (Calculated): 53.7  PainSc:   8  PainLoc: Abdomen   Wt Readings from Last 3 Encounters:  04/30/24 (!) 364 lb (165.1 kg)  04/21/24 (!) 368 lb (166.9 kg)  11/27/22 (!) 357 lb 12.8 oz (162.3 kg)   Physical Exam Gastrointestinal: Abdomen is tender to palpation, particularly on the right side. Notable swelling and fluid accumulation observed. Extremities: Swelling and tenderness noted in the right lower extremity (thigh area). Skin: No visible wounds or lesions on the abdomen or lower extremities.   Physical Exam Constitutional:      General: She is not in acute distress. Cardiovascular:     Rate and Rhythm: Normal rate.  Pulmonary:     Effort: No respiratory distress.  Abdominal:     General: There is no distension.     Palpations: Abdomen is soft. There is no mass.     Tenderness: There is abdominal tenderness.  Neurological:     Mental Status: She is alert and oriented to person, place, and time. Mental status is at baseline.  Psychiatric:        Mood and Affect: Affect is tearful.     Rosaline JENEANE Maid, FNP

## 2024-05-02 DIAGNOSIS — F411 Generalized anxiety disorder: Secondary | ICD-10-CM | POA: Diagnosis not present

## 2024-05-02 DIAGNOSIS — N952 Postmenopausal atrophic vaginitis: Secondary | ICD-10-CM | POA: Diagnosis not present

## 2024-05-02 DIAGNOSIS — M62838 Other muscle spasm: Secondary | ICD-10-CM | POA: Diagnosis not present

## 2024-05-02 DIAGNOSIS — K580 Irritable bowel syndrome with diarrhea: Secondary | ICD-10-CM | POA: Diagnosis not present

## 2024-05-02 DIAGNOSIS — G43909 Migraine, unspecified, not intractable, without status migrainosus: Secondary | ICD-10-CM | POA: Diagnosis not present

## 2024-05-02 DIAGNOSIS — M170 Bilateral primary osteoarthritis of knee: Secondary | ICD-10-CM | POA: Diagnosis not present

## 2024-05-02 DIAGNOSIS — F32A Depression, unspecified: Secondary | ICD-10-CM | POA: Diagnosis not present

## 2024-05-02 DIAGNOSIS — Z87440 Personal history of urinary (tract) infections: Secondary | ICD-10-CM | POA: Diagnosis not present

## 2024-05-02 DIAGNOSIS — F17210 Nicotine dependence, cigarettes, uncomplicated: Secondary | ICD-10-CM | POA: Diagnosis not present

## 2024-05-02 DIAGNOSIS — I110 Hypertensive heart disease with heart failure: Secondary | ICD-10-CM | POA: Diagnosis not present

## 2024-05-02 DIAGNOSIS — E119 Type 2 diabetes mellitus without complications: Secondary | ICD-10-CM | POA: Diagnosis not present

## 2024-05-02 DIAGNOSIS — R413 Other amnesia: Secondary | ICD-10-CM | POA: Diagnosis not present

## 2024-05-02 DIAGNOSIS — G8929 Other chronic pain: Secondary | ICD-10-CM | POA: Diagnosis not present

## 2024-05-02 DIAGNOSIS — E781 Pure hyperglyceridemia: Secondary | ICD-10-CM | POA: Diagnosis not present

## 2024-05-02 DIAGNOSIS — Z7951 Long term (current) use of inhaled steroids: Secondary | ICD-10-CM | POA: Diagnosis not present

## 2024-05-02 DIAGNOSIS — Z6841 Body Mass Index (BMI) 40.0 and over, adult: Secondary | ICD-10-CM | POA: Diagnosis not present

## 2024-05-02 DIAGNOSIS — E669 Obesity, unspecified: Secondary | ICD-10-CM | POA: Diagnosis not present

## 2024-05-02 DIAGNOSIS — G4730 Sleep apnea, unspecified: Secondary | ICD-10-CM | POA: Diagnosis not present

## 2024-05-02 DIAGNOSIS — M47816 Spondylosis without myelopathy or radiculopathy, lumbar region: Secondary | ICD-10-CM | POA: Diagnosis not present

## 2024-05-02 DIAGNOSIS — R1310 Dysphagia, unspecified: Secondary | ICD-10-CM | POA: Diagnosis not present

## 2024-05-02 DIAGNOSIS — I509 Heart failure, unspecified: Secondary | ICD-10-CM | POA: Diagnosis not present

## 2024-05-02 DIAGNOSIS — M160 Bilateral primary osteoarthritis of hip: Secondary | ICD-10-CM | POA: Diagnosis not present

## 2024-05-02 DIAGNOSIS — J4489 Other specified chronic obstructive pulmonary disease: Secondary | ICD-10-CM | POA: Diagnosis not present

## 2024-05-02 DIAGNOSIS — M810 Age-related osteoporosis without current pathological fracture: Secondary | ICD-10-CM | POA: Diagnosis not present

## 2024-05-02 DIAGNOSIS — L732 Hidradenitis suppurativa: Secondary | ICD-10-CM | POA: Diagnosis not present

## 2024-05-04 ENCOUNTER — Other Ambulatory Visit: Payer: Self-pay

## 2024-05-04 ENCOUNTER — Emergency Department (HOSPITAL_COMMUNITY)

## 2024-05-04 ENCOUNTER — Emergency Department (HOSPITAL_COMMUNITY)
Admission: EM | Admit: 2024-05-04 | Discharge: 2024-05-04 | Disposition: A | Discharge status: Home / Self Care | Attending: Emergency Medicine | Admitting: Emergency Medicine

## 2024-05-04 DIAGNOSIS — R0989 Other specified symptoms and signs involving the circulatory and respiratory systems: Secondary | ICD-10-CM | POA: Diagnosis not present

## 2024-05-04 DIAGNOSIS — E039 Hypothyroidism, unspecified: Secondary | ICD-10-CM | POA: Diagnosis not present

## 2024-05-04 DIAGNOSIS — E119 Type 2 diabetes mellitus without complications: Secondary | ICD-10-CM | POA: Insufficient documentation

## 2024-05-04 DIAGNOSIS — I509 Heart failure, unspecified: Secondary | ICD-10-CM | POA: Insufficient documentation

## 2024-05-04 DIAGNOSIS — Q6102 Congenital multiple renal cysts: Secondary | ICD-10-CM

## 2024-05-04 DIAGNOSIS — N281 Cyst of kidney, acquired: Secondary | ICD-10-CM | POA: Diagnosis not present

## 2024-05-04 DIAGNOSIS — Z743 Need for continuous supervision: Secondary | ICD-10-CM | POA: Diagnosis not present

## 2024-05-04 DIAGNOSIS — R109 Unspecified abdominal pain: Secondary | ICD-10-CM | POA: Diagnosis not present

## 2024-05-04 DIAGNOSIS — R9431 Abnormal electrocardiogram [ECG] [EKG]: Secondary | ICD-10-CM | POA: Diagnosis not present

## 2024-05-04 DIAGNOSIS — N289 Disorder of kidney and ureter, unspecified: Secondary | ICD-10-CM | POA: Diagnosis not present

## 2024-05-04 DIAGNOSIS — R609 Edema, unspecified: Secondary | ICD-10-CM | POA: Diagnosis not present

## 2024-05-04 DIAGNOSIS — Z90710 Acquired absence of both cervix and uterus: Secondary | ICD-10-CM | POA: Diagnosis not present

## 2024-05-04 DIAGNOSIS — Z79899 Other long term (current) drug therapy: Secondary | ICD-10-CM | POA: Insufficient documentation

## 2024-05-04 DIAGNOSIS — R1011 Right upper quadrant pain: Secondary | ICD-10-CM | POA: Insufficient documentation

## 2024-05-04 DIAGNOSIS — K7460 Unspecified cirrhosis of liver: Secondary | ICD-10-CM | POA: Diagnosis not present

## 2024-05-04 DIAGNOSIS — R110 Nausea: Secondary | ICD-10-CM | POA: Insufficient documentation

## 2024-05-04 DIAGNOSIS — R600 Localized edema: Secondary | ICD-10-CM | POA: Diagnosis not present

## 2024-05-04 DIAGNOSIS — J449 Chronic obstructive pulmonary disease, unspecified: Secondary | ICD-10-CM | POA: Diagnosis not present

## 2024-05-04 DIAGNOSIS — M1611 Unilateral primary osteoarthritis, right hip: Secondary | ICD-10-CM | POA: Diagnosis not present

## 2024-05-04 DIAGNOSIS — I517 Cardiomegaly: Secondary | ICD-10-CM | POA: Diagnosis not present

## 2024-05-04 DIAGNOSIS — K580 Irritable bowel syndrome with diarrhea: Secondary | ICD-10-CM | POA: Diagnosis not present

## 2024-05-04 DIAGNOSIS — Z9049 Acquired absence of other specified parts of digestive tract: Secondary | ICD-10-CM | POA: Diagnosis not present

## 2024-05-04 DIAGNOSIS — M1711 Unilateral primary osteoarthritis, right knee: Secondary | ICD-10-CM | POA: Diagnosis not present

## 2024-05-04 DIAGNOSIS — I10 Essential (primary) hypertension: Secondary | ICD-10-CM | POA: Diagnosis not present

## 2024-05-04 DIAGNOSIS — R1031 Right lower quadrant pain: Secondary | ICD-10-CM | POA: Diagnosis not present

## 2024-05-04 DIAGNOSIS — J811 Chronic pulmonary edema: Secondary | ICD-10-CM | POA: Diagnosis not present

## 2024-05-04 DIAGNOSIS — R112 Nausea with vomiting, unspecified: Secondary | ICD-10-CM | POA: Diagnosis not present

## 2024-05-04 LAB — CBC
HCT: 41.1 % (ref 36.0–46.0)
Hemoglobin: 12.9 g/dL (ref 12.0–15.0)
MCH: 26.9 pg (ref 26.0–34.0)
MCHC: 31.4 g/dL (ref 30.0–36.0)
MCV: 85.8 fL (ref 80.0–100.0)
Platelets: 355 K/uL (ref 150–400)
RBC: 4.79 MIL/uL (ref 3.87–5.11)
RDW: 13.9 % (ref 11.5–15.5)
WBC: 9.4 K/uL (ref 4.0–10.5)
nRBC: 0 % (ref 0.0–0.2)

## 2024-05-04 LAB — RESP PANEL BY RT-PCR (RSV, FLU A&B, COVID)  RVPGX2
Influenza A by PCR: NEGATIVE
Influenza B by PCR: NEGATIVE
Resp Syncytial Virus by PCR: NEGATIVE
SARS Coronavirus 2 by RT PCR: NEGATIVE

## 2024-05-04 LAB — URINALYSIS, ROUTINE W REFLEX MICROSCOPIC
Bilirubin Urine: NEGATIVE
Glucose, UA: NEGATIVE mg/dL
Hgb urine dipstick: NEGATIVE
Ketones, ur: NEGATIVE mg/dL
Leukocytes,Ua: NEGATIVE
Nitrite: NEGATIVE
Protein, ur: NEGATIVE mg/dL
Specific Gravity, Urine: 1.01 (ref 1.005–1.030)
pH: 6 (ref 5.0–8.0)

## 2024-05-04 LAB — COMPREHENSIVE METABOLIC PANEL WITH GFR
ALT: 18 U/L (ref 0–44)
AST: 15 U/L (ref 15–41)
Albumin: 3.5 g/dL (ref 3.5–5.0)
Alkaline Phosphatase: 56 U/L (ref 38–126)
Anion gap: 10 (ref 5–15)
BUN: 6 mg/dL (ref 6–20)
CO2: 26 mmol/L (ref 22–32)
Calcium: 8.5 mg/dL — ABNORMAL LOW (ref 8.9–10.3)
Chloride: 107 mmol/L (ref 98–111)
Creatinine, Ser: 0.99 mg/dL (ref 0.44–1.00)
GFR, Estimated: >60 mL/min (ref >60)
Glucose, Bld: 101 mg/dL — ABNORMAL HIGH (ref 70–99)
Potassium: 3.5 mmol/L (ref 3.5–5.1)
Sodium: 143 mmol/L (ref 135–145)
Total Bilirubin: 1.1 mg/dL (ref 0.0–1.2)
Total Protein: 6.5 g/dL (ref 6.5–8.1)

## 2024-05-04 LAB — I-STAT CHEM 8, ED
BUN: 5 mg/dL — ABNORMAL LOW (ref 6–20)
Calcium, Ion: 1 mmol/L — ABNORMAL LOW (ref 1.15–1.40)
Chloride: 105 mmol/L (ref 98–111)
Creatinine, Ser: 1 mg/dL (ref 0.44–1.00)
Glucose, Bld: 91 mg/dL (ref 70–99)
HCT: 38 % (ref 36.0–46.0)
Hemoglobin: 12.9 g/dL (ref 12.0–15.0)
Potassium: 3.1 mmol/L — ABNORMAL LOW (ref 3.5–5.1)
Sodium: 142 mmol/L (ref 135–145)
TCO2: 26 mmol/L (ref 22–32)

## 2024-05-04 LAB — TROPONIN I (HIGH SENSITIVITY)
Troponin I (High Sensitivity): 4 ng/L (ref <18)
Troponin I (High Sensitivity): 4 ng/L (ref <18)

## 2024-05-04 LAB — BRAIN NATRIURETIC PEPTIDE: B Natriuretic Peptide: 23.1 pg/mL (ref 0.0–100.0)

## 2024-05-04 LAB — LIPASE, BLOOD: Lipase: 20 U/L (ref 11–51)

## 2024-05-04 LAB — MAGNESIUM: Magnesium: 1.8 mg/dL (ref 1.7–2.4)

## 2024-05-04 MED ORDER — DICYCLOMINE HCL 20 MG PO TABS: 20.0000 mg | ORAL_TABLET | Freq: Two times a day (BID) | ORAL | 0 refills | Status: AC

## 2024-05-04 MED ORDER — DIPHENHYDRAMINE HCL 50 MG/ML IJ SOLN: 50.0000 mg | Freq: Once | INTRAMUSCULAR | Status: DC

## 2024-05-04 MED ORDER — IOHEXOL 350 MG/ML SOLN
75.0000 mL | Freq: Once | INTRAVENOUS | Status: AC | PRN
  Administered 2024-05-04: 75 mL via INTRAVENOUS

## 2024-05-04 MED ORDER — IBUPROFEN 400 MG PO TABS
600.0000 mg | ORAL_TABLET | Freq: Once | ORAL | Status: AC
  Administered 2024-05-04: 600 mg via ORAL
  Filled 2024-05-04: qty 1

## 2024-05-04 MED ORDER — DIPHENHYDRAMINE HCL 50 MG/ML IJ SOLN
50.0000 mg | Freq: Once | INTRAMUSCULAR | Status: AC
  Administered 2024-05-04: 50 mg via INTRAVENOUS
  Filled 2024-05-04: qty 1

## 2024-05-04 MED ORDER — DIAZEPAM 2 MG PO TABS
2.0000 mg | ORAL_TABLET | Freq: Once | ORAL | Status: AC
Start: 2024-05-04 — End: 2024-05-04
  Administered 2024-05-04: 2 mg via ORAL
  Filled 2024-05-04: qty 1

## 2024-05-04 MED ORDER — FENTANYL CITRATE (PF) 50 MCG/ML IJ SOSY
50.0000 ug | PREFILLED_SYRINGE | Freq: Once | INTRAMUSCULAR | Status: AC
  Administered 2024-05-04: 50 ug via INTRAVENOUS
  Filled 2024-05-04: qty 1

## 2024-05-04 MED ORDER — MORPHINE SULFATE (PF) 4 MG/ML IV SOLN
4.0000 mg | Freq: Once | INTRAVENOUS | Status: AC
  Administered 2024-05-04: 4 mg via INTRAVENOUS
  Filled 2024-05-04: qty 1

## 2024-05-04 MED ORDER — METHYLPREDNISOLONE SODIUM SUCC 40 MG IJ SOLR: 40.0000 mg | Freq: Once | INTRAMUSCULAR | Status: DC

## 2024-05-04 MED ORDER — DIPHENHYDRAMINE HCL 25 MG PO CAPS: 50.0000 mg | ORAL_CAPSULE | Freq: Once | ORAL | Status: DC

## 2024-05-04 MED ORDER — DIPHENHYDRAMINE HCL 25 MG PO CAPS: 50.0000 mg | ORAL_CAPSULE | Freq: Once | ORAL | Status: AC

## 2024-05-04 MED ORDER — METHYLPREDNISOLONE SODIUM SUCC 125 MG IJ SOLR
125.0000 mg | Freq: Once | INTRAMUSCULAR | Status: AC
  Administered 2024-05-04: 125 mg via INTRAVENOUS
  Filled 2024-05-04: qty 2

## 2024-05-04 MED ORDER — ACETAMINOPHEN 500 MG PO TABS
1000.0000 mg | ORAL_TABLET | Freq: Once | ORAL | Status: AC
  Administered 2024-05-04: 1000 mg via ORAL
  Filled 2024-05-04: qty 2

## 2024-05-04 MED ORDER — ONDANSETRON HCL 4 MG/2ML IJ SOLN
4.0000 mg | Freq: Once | INTRAMUSCULAR | Status: AC
  Administered 2024-05-04: 4 mg via INTRAVENOUS
  Filled 2024-05-04: qty 2

## 2024-05-04 MED ORDER — POTASSIUM CHLORIDE CRYS ER 20 MEQ PO TBCR
40.0000 meq | EXTENDED_RELEASE_TABLET | Freq: Once | ORAL | Status: AC
  Administered 2024-05-04: 40 meq via ORAL
  Filled 2024-05-04: qty 2

## 2024-05-04 NOTE — Discharge Instructions (Addendum)
 Follow-up with your primary care doctor.  Return for any concerning symptoms.  Your CT scan with contrast showed that you have benign renal cysts.  No findings within the liver.  Follow-up with your primary care doctor.  Return for any concerning symptoms. Your potassium was slightly low.  You are given a potassium supplement in the emergency department.  Have your primary care recheck this in about a week to ensure that it is not dropping again.

## 2024-05-04 NOTE — ED Provider Triage Note (Signed)
 Emergency Medicine Provider Triage Evaluation Note  Brianna Roberts , Roberts 58 y.o. female  was evaluated in triage.  Pt complains of rlq pain and swelling. Had ct at [facility] today. Sent here do to pain.  Review of Systems  Positive: Abd pain Negative:   Physical Exam  BP (!) 103/51 (BP Location: Right Arm)   Pulse 67   Temp 98.4 F (36.9 C)   Resp 18   SpO2 95%  Gen:   Awake, no distress   Resp:  Normal effort  ABD:  Diffuse tenderness  MSK:   Moves extremities without difficulty  Other:    Medical Decision Making  Medically screening exam initiated at 1:41 PM.  Appropriate orders placed.  Brianna Roberts was informed that the remainder of the evaluation will be completed by another provider, this initial triage assessment does not replace that evaluation, and the importance of remaining in the ED until their evaluation is complete.  Abd pain   Brianna Caridi A, PA-C 05/04/24 1342

## 2024-05-04 NOTE — ED Provider Notes (Addendum)
 [facility] Provider Note   CSN: [id number] Arrival date & time: 05/04/24  1240     Patient presents with: Abdominal Pain   Brianna Roberts is a 58 y.o. female with past medical history of hypertension, IBS-D, type 2 diabetes, COPD, possible new onset CHF who presents to the emergency department for evaluation of worsening right-sided abdominal pain and swelling.  Patient states she was evaluated by her PCP on Thursday 11/6 and that is when she noticed swelling in her abdomen becoming painful.  She states the swelling has been present since 04/07/2024.  Patient also reports low back pain and pain extending into her thigh.  She is followed by [facility] and she had a CT scan this morning, however the imaging is not crossing over at this time.  Patient states she feels like there is fluid in there and she states she cannot handle the pain.  She does endorse nausea without vomiting.  She has had 4 episodes of diarrhea today without hematochezia or melena.  Patient has also had multiple abdominal surgeries which include cholecystectomy and partial hysterectomy.    Abdominal Pain      Prior to Admission medications   Medication Sig Start Date End Date Taking? Authorizing Provider  albuterol  (VENTOLIN  HFA) 108 (90 Base) MCG/ACT inhaler Inhale 1-2 puffs into the lungs every 6 (six) hours as needed for wheezing or shortness of breath. 01/23/23   Raford Lenis, MD  amitriptyline  (ELAVIL ) 100 MG tablet Take 1 tablet (100 mg total) by mouth at bedtime. 08/02/23   Masters, Katie, DO  amLODipine  (NORVASC ) 10 MG tablet Take 1 tablet (10 mg total) by mouth daily. 03/26/23   Jolaine Pac, DO  clindamycin (CLINDAGEL) 1 % gel Apply 1 Application topically 2 (two) times daily. 12/29/23   [provider]  diazepam  (VALIUM ) 2 MG tablet Take 2 mg by mouth 2 (two) times daily. 12/29/23   [provider]  diazepam  (VALIUM ) 5 MG tablet Take 1 tablet (5 mg  total) by mouth every 8 (eight) hours as needed for anxiety. 09/03/23   Marylu Gee, DO  fluticasone  (FLONASE ) 50 MCG/ACT nasal spray Place 1 spray into both nostrils daily. 03/26/23   Jolaine Pac, DO  Fluticasone -Umeclidin-Vilant (TRELEGY ELLIPTA ) 200-62.5-25 MCG/ACT AEPB Inhale 1 Inhalation into the lungs daily at 6 (six) AM. 08/30/23   Marylu Gee, DO  furosemide  (LASIX ) 40 MG tablet Take 1 tablet (40 mg total) by mouth 2 (two) times daily. 03/26/23   Jolaine Pac, DO  hydrOXYzine  (ATARAX ) 10 MG tablet Take 10 mg by mouth 2 (two) times daily. 12/28/23   [provider]  levothyroxine  (SYNTHROID ) 50 MCG tablet Take 1 tablet (50 mcg total) by mouth daily before breakfast. 03/06/23   Tobie Gaines, DO  meclizine  (ANTIVERT ) 25 MG tablet Take 1 tablet (25 mg total) by mouth 3 (three) times daily as needed for dizziness. 08/04/23   Ruthell Lonni FALCON, PA-C  meloxicam  (MOBIC ) 7.5 MG tablet TAKE 2 TABLETS BY MOUTH IN THE MORNING AND AT BEDTIME 11/25/23   Marylu Gee, DO  metoprolol  tartrate (LOPRESSOR ) 100 MG tablet Take 1 tablet (100 mg total) by mouth 2 (two) times daily. 03/26/23   Jolaine Pac, DO  nystatin  cream (MYCOSTATIN ) Apply 1 Application topically 2 (two) times daily. 03/26/23   Jolaine Pac, DO  ondansetron  (ZOFRAN -ODT) 8 MG disintegrating tablet Take 1 tablet (8 mg total) by mouth every 8 (eight) hours as needed for nausea or vomiting. 01/25/23  Tobie Gaines, DO  oxyCODONE -acetaminophen  (PERCOCET) 10-325 MG tablet Take 1 tablet by mouth daily as needed. 12/29/23   [provider]  potassium chloride  (KLOR-CON  M) 10 MEQ tablet Take 1 tablet (10 mEq total) by mouth 2 (two) times daily. 09/02/23   Marylu Gee, DO  triamcinolone  (KENALOG ) 0.025 % ointment Apply 1 Application topically 2 (two) times daily. 12/10/23   Burnette, Jennifer M, PA-C  Triamcinolone  Acetonide (TRIAMCINOLONE  0.1 % CREAM : EUCERIN) CREA Apply 1 Application topically daily as needed. 09/03/23   Marylu Gee, DO  valACYclovir  (VALTREX ) 1000 MG tablet Take 1 tablet (1,000 mg total) by mouth daily. 03/26/23   Jolaine Pac, DO    Allergies: Hydromorphone, Iodinated contrast media, Sulfa antibiotics, Codeine, Nickel, Penicillins, Spironolactone, Cefdinir, and Adhesive [tape]    Review of Systems  Gastrointestinal:  Positive for abdominal pain.    Updated Vital Signs BP 116/61 (BP Location: Left Arm)   Pulse 72   Temp 98.4 F (36.9 C)   Resp 18   SpO2 100%   Physical Exam Vitals and nursing note reviewed.  Constitutional:      General: She is in acute distress.     Appearance: Normal appearance.  HENT:     Head: Normocephalic and atraumatic.     Mouth/Throat:     Mouth: Mucous membranes are moist.  Eyes:     General: No scleral icterus.       Right eye: No discharge.        Left eye: No discharge.     Conjunctiva/sclera: Conjunctivae normal.  Cardiovascular:     Rate and Rhythm: Normal rate and regular rhythm.     Pulses: Normal pulses.  Pulmonary:     Effort: Pulmonary effort is normal.     Breath sounds: Normal breath sounds.  Abdominal:     General: There is no distension.     Tenderness: There is abdominal tenderness. There is no rebound. Negative signs include Murphy's sign and Rovsing's sign.     Comments: Significant abdominal tenderness to right upper and right lower quadrant.  Right-sided back pain is also tender to palpation.  Musculoskeletal:        General: No deformity.     Cervical back: Normal range of motion.     Right lower leg: 2+ Edema present.     Left lower leg: 2+ Edema present.  Skin:    General: Skin is warm and dry.     Capillary Refill: Capillary refill takes less than 2 seconds.  Neurological:     Mental Status: She is alert.     Motor: No weakness.  Psychiatric:        Mood and Affect: Mood normal.     (all labs ordered are listed, but only abnormal results are displayed) Labs Reviewed  I-STAT CHEM 8, ED - Abnormal; Notable for  the following components:      Result Value   Potassium 3.1 (*)    BUN 5 (*)    Calcium, Ion 1.00 (*)    All other components within normal limits  RESP PANEL BY RT-PCR (RSV, FLU A&B, COVID)  [id number]  CBC  URINALYSIS, ROUTINE W REFLEX MICROSCOPIC  LIPASE, BLOOD  COMPREHENSIVE METABOLIC PANEL WITH GFR  BRAIN NATRIURETIC PEPTIDE  MAGNESIUM   TROPONIN I (HIGH SENSITIVITY)    EKG: None  Radiology: CT OUTSIDE FILMS BODY/ABD/PELVIS Result Date: 05/04/2024 This examination belongs to an outside facility and is stored here for comparison purposes only.  Contact the originating outside  institution for any associated report or interpretation.   Procedures   Medications Ordered in the ED  potassium chloride  SA (KLOR-CON  M) CR tablet 40 mEq (has no administration in time range)  morphine  (PF) 4 MG/ML injection 4 mg (4 mg Intravenous Given 05/04/24 1536)  ondansetron  (ZOFRAN ) injection 4 mg (4 mg Intravenous Given 05/04/24 1536)                                Medical Decision Making Amount and/or Complexity of Data Reviewed Labs: ordered. Radiology: ordered.  Risk Prescription drug management.   This patient presents to the ED for concern of abdominal pain and swelling, this involves an extensive number of treatment options, and is a complaint that carries with it a high risk of complications and morbidity.  Differential diagnosis includes: Mesenteric ischemia, UTI, pancreatitis, ascites, abdominal wall hernia, steatotic liver disease, hydronephrosis, malignancy, CHF exacerbation  Co morbidities:  morbid obesity, type 2 diabetes, hypothyroid, IBS-D, congestive heart failure   Additional history: Patient was evaluated by her PCP on 11/6 and obtained CT scan this [facility] at [facility].  Image will not currently crossover.  Lab Tests:  I Ordered, and personally interpreted labs.  The pertinent results include:    - Potassium: 3.1  Imaging Studies:  I ordered imaging studies  including CT abdomen pelvis with contrast Imaging not yet complete at time of handoff   Cardiac Monitoring/ECG:  The patient was maintained on a cardiac monitor.  I personally viewed and interpreted the cardiac monitored which showed an underlying rhythm of: Normal sinus  Medicines ordered and prescription drug management:  I ordered medication including  Medications  potassium chloride  SA (KLOR-CON  M) CR tablet 40 mEq (has no administration in time range)  morphine  (PF) 4 MG/ML injection 4 mg (4 mg Intravenous Given 05/04/24 1536)  ondansetron  (ZOFRAN ) injection 4 mg (4 mg Intravenous Given 05/04/24 1536)   for pain management Reevaluation of the patient after these medicines showed that the patient improved I have reviewed the patients home medicines and have made adjustments as needed  Test Considered:   none  Critical Interventions:   none  Consultations Obtained: None  Problem List / ED Course:     ICD-10-CM   1. Right sided abdominal pain  R10.9     2. Nausea without vomiting  R11.0       MDM: 58 year old female who presents emergency department for evaluation of significantly worsening abdominal pain and swelling.  Patient states she initially noticed right sided abdominal swelling on 04/07/2024, but it was not painful.  However, when she was seen by her PCP on 11/6, she reported increased pain at that time.  Patient also reports nausea without vomiting and diarrhea.  She denies chest pain and shortness of breath, but does report increased edema to her lower extremities for which she took Lasix  this morning.  She was advised to obtain an outpatient CT of her abdomen and pelvis, which she did this morning.  However, this exam was performed at [facility] and the results are not crossing over. Photograph of patient's CT results taken and placed in chart. Recommendation from [facility] was to repeat CT with contrast. Given patient's contrast allergy, she will need premeds prior to  imaging.  Patient also reports dysuria, but her urine does not appear infected.  No leukocytosis.  I-STAT and lipase currently pending.  BNP pending. Handoff given to Amjad, PA-C who will continue patient  workup.   Dispostion:  After consideration of the diagnostic results and the patients response to treatment, I feel that the patient would benefit from CT abdomen and pelvis with contrast and reassessment after imaging complete.   Final diagnoses:  Right sided abdominal pain  Nausea without vomiting    ED Discharge Orders     None          Torrence Marry RAMAN, PA-C 05/04/24 1538    Betzabeth Derringer, Marry RAMAN, PA-C 05/04/24 1538    Dean Clarity, MD 05/04/24 [id number]

## 2024-05-04 NOTE — ED Triage Notes (Signed)
 Patient with swelling and pain to RLQ for 1.5 weeks. Went to imaging center for ordered scan to further eval same but they were unable to complete the imaging and called EMS since she was unable to tolerate lying down d/t pain.

## 2024-05-04 NOTE — ED Notes (Signed)
 Lab was called, troponin and magnesium  to be added on

## 2024-05-04 NOTE — ED Notes (Signed)
 Patient transported to CT

## 2024-05-04 NOTE — ED Provider Notes (Signed)
 See previous note for full details.  Patient received in signout.  Has history of CHF and has had some peripheral edema as well as abdominal pain and abdominal swelling.  She had a CT scan performed outpatient and states that she was sent in for a CT scan with contrast.  She states typically she requires Benadryl  due to a previous reaction she had.   Physical Exam  BP 116/61 (BP Location: Left Arm)   Pulse 72   Temp 98.4 F (36.9 C)   Resp 18   SpO2 100%   Procedures  Procedures  ED Course / MDM    Medical Decision Making Amount and/or Complexity of Data Reviewed Labs: ordered. Radiology: ordered.  Risk Prescription drug management.   Patient CT scan performed was without contrast.  Patient wondering why we did not obtain it with contrast. She showed me the results of her scan from outpatient.  Attached under media tab. Will repeat CT scan with contrast.  Will also obtain additional labs to ensure she is not in decompensated heart failure.  CT with contrast shows benign renal cyst.  No other acute process. Bentyl prescribed.  Patient has Zofran  at home.  She has chronic pain medications as well.  She is stable for discharge.  She is in agreement with plan.      Hildegard Loge, PA-C 05/04/24 2143    Mannie Pac T, DO 05/05/24 2327

## 2024-05-04 NOTE — ED Notes (Signed)
 This RN reviewed discharge instructions with patient. She verbalized understanding and denied any further questions. PT well appearing upon discharge and reports 10/10 abd pain. Pt ambulated wheeled out b w/c.Pt endorses ride home.

## 2024-05-04 NOTE — ED Notes (Signed)
 Pt negative for airborne precautions.

## 2024-05-04 NOTE — ED Notes (Signed)
 Pt to xray

## 2024-05-05 DIAGNOSIS — E669 Obesity, unspecified: Secondary | ICD-10-CM | POA: Diagnosis not present

## 2024-05-05 DIAGNOSIS — F17210 Nicotine dependence, cigarettes, uncomplicated: Secondary | ICD-10-CM | POA: Diagnosis not present

## 2024-05-05 DIAGNOSIS — L732 Hidradenitis suppurativa: Secondary | ICD-10-CM | POA: Diagnosis not present

## 2024-05-05 DIAGNOSIS — G8929 Other chronic pain: Secondary | ICD-10-CM | POA: Diagnosis not present

## 2024-05-05 DIAGNOSIS — R413 Other amnesia: Secondary | ICD-10-CM | POA: Diagnosis not present

## 2024-05-05 DIAGNOSIS — Z87440 Personal history of urinary (tract) infections: Secondary | ICD-10-CM | POA: Diagnosis not present

## 2024-05-05 DIAGNOSIS — N952 Postmenopausal atrophic vaginitis: Secondary | ICD-10-CM | POA: Diagnosis not present

## 2024-05-05 DIAGNOSIS — K580 Irritable bowel syndrome with diarrhea: Secondary | ICD-10-CM | POA: Diagnosis not present

## 2024-05-05 DIAGNOSIS — J4489 Other specified chronic obstructive pulmonary disease: Secondary | ICD-10-CM | POA: Diagnosis not present

## 2024-05-05 DIAGNOSIS — E119 Type 2 diabetes mellitus without complications: Secondary | ICD-10-CM | POA: Diagnosis not present

## 2024-05-05 DIAGNOSIS — G43909 Migraine, unspecified, not intractable, without status migrainosus: Secondary | ICD-10-CM | POA: Diagnosis not present

## 2024-05-05 DIAGNOSIS — F32A Depression, unspecified: Secondary | ICD-10-CM | POA: Diagnosis not present

## 2024-05-05 DIAGNOSIS — I110 Hypertensive heart disease with heart failure: Secondary | ICD-10-CM | POA: Diagnosis not present

## 2024-05-05 DIAGNOSIS — M810 Age-related osteoporosis without current pathological fracture: Secondary | ICD-10-CM | POA: Diagnosis not present

## 2024-05-05 DIAGNOSIS — E781 Pure hyperglyceridemia: Secondary | ICD-10-CM | POA: Diagnosis not present

## 2024-05-05 DIAGNOSIS — I509 Heart failure, unspecified: Secondary | ICD-10-CM | POA: Diagnosis not present

## 2024-05-05 DIAGNOSIS — Z7951 Long term (current) use of inhaled steroids: Secondary | ICD-10-CM | POA: Diagnosis not present

## 2024-05-05 DIAGNOSIS — Z6841 Body Mass Index (BMI) 40.0 and over, adult: Secondary | ICD-10-CM | POA: Diagnosis not present

## 2024-05-05 DIAGNOSIS — M160 Bilateral primary osteoarthritis of hip: Secondary | ICD-10-CM | POA: Diagnosis not present

## 2024-05-05 DIAGNOSIS — M62838 Other muscle spasm: Secondary | ICD-10-CM | POA: Diagnosis not present

## 2024-05-05 DIAGNOSIS — F411 Generalized anxiety disorder: Secondary | ICD-10-CM | POA: Diagnosis not present

## 2024-05-05 DIAGNOSIS — G4730 Sleep apnea, unspecified: Secondary | ICD-10-CM | POA: Diagnosis not present

## 2024-05-05 DIAGNOSIS — M170 Bilateral primary osteoarthritis of knee: Secondary | ICD-10-CM | POA: Diagnosis not present

## 2024-05-05 DIAGNOSIS — R1310 Dysphagia, unspecified: Secondary | ICD-10-CM | POA: Diagnosis not present

## 2024-05-05 DIAGNOSIS — M47816 Spondylosis without myelopathy or radiculopathy, lumbar region: Secondary | ICD-10-CM | POA: Diagnosis not present

## 2024-05-07 DIAGNOSIS — F411 Generalized anxiety disorder: Secondary | ICD-10-CM | POA: Diagnosis not present

## 2024-05-07 DIAGNOSIS — F341 Dysthymic disorder: Secondary | ICD-10-CM | POA: Diagnosis not present

## 2024-05-11 ENCOUNTER — Ambulatory Visit: Admitting: Diagnostic Neuroimaging

## 2024-05-11 DIAGNOSIS — F411 Generalized anxiety disorder: Secondary | ICD-10-CM | POA: Diagnosis not present

## 2024-05-11 DIAGNOSIS — F341 Dysthymic disorder: Secondary | ICD-10-CM | POA: Diagnosis not present

## 2024-05-12 DIAGNOSIS — N952 Postmenopausal atrophic vaginitis: Secondary | ICD-10-CM | POA: Diagnosis not present

## 2024-05-12 DIAGNOSIS — M47816 Spondylosis without myelopathy or radiculopathy, lumbar region: Secondary | ICD-10-CM | POA: Diagnosis not present

## 2024-05-12 DIAGNOSIS — E119 Type 2 diabetes mellitus without complications: Secondary | ICD-10-CM | POA: Diagnosis not present

## 2024-05-12 DIAGNOSIS — M62838 Other muscle spasm: Secondary | ICD-10-CM | POA: Diagnosis not present

## 2024-05-12 DIAGNOSIS — I110 Hypertensive heart disease with heart failure: Secondary | ICD-10-CM | POA: Diagnosis not present

## 2024-05-12 DIAGNOSIS — Z87440 Personal history of urinary (tract) infections: Secondary | ICD-10-CM | POA: Diagnosis not present

## 2024-05-12 DIAGNOSIS — F32A Depression, unspecified: Secondary | ICD-10-CM | POA: Diagnosis not present

## 2024-05-12 DIAGNOSIS — I509 Heart failure, unspecified: Secondary | ICD-10-CM | POA: Diagnosis not present

## 2024-05-12 DIAGNOSIS — Z7951 Long term (current) use of inhaled steroids: Secondary | ICD-10-CM | POA: Diagnosis not present

## 2024-05-12 DIAGNOSIS — M160 Bilateral primary osteoarthritis of hip: Secondary | ICD-10-CM | POA: Diagnosis not present

## 2024-05-12 DIAGNOSIS — F17210 Nicotine dependence, cigarettes, uncomplicated: Secondary | ICD-10-CM | POA: Diagnosis not present

## 2024-05-12 DIAGNOSIS — J4489 Other specified chronic obstructive pulmonary disease: Secondary | ICD-10-CM | POA: Diagnosis not present

## 2024-05-12 DIAGNOSIS — F411 Generalized anxiety disorder: Secondary | ICD-10-CM | POA: Diagnosis not present

## 2024-05-12 DIAGNOSIS — R413 Other amnesia: Secondary | ICD-10-CM | POA: Diagnosis not present

## 2024-05-12 DIAGNOSIS — R1310 Dysphagia, unspecified: Secondary | ICD-10-CM | POA: Diagnosis not present

## 2024-05-12 DIAGNOSIS — E781 Pure hyperglyceridemia: Secondary | ICD-10-CM | POA: Diagnosis not present

## 2024-05-12 DIAGNOSIS — G43909 Migraine, unspecified, not intractable, without status migrainosus: Secondary | ICD-10-CM | POA: Diagnosis not present

## 2024-05-12 DIAGNOSIS — K580 Irritable bowel syndrome with diarrhea: Secondary | ICD-10-CM | POA: Diagnosis not present

## 2024-05-12 DIAGNOSIS — M170 Bilateral primary osteoarthritis of knee: Secondary | ICD-10-CM | POA: Diagnosis not present

## 2024-05-12 DIAGNOSIS — G8929 Other chronic pain: Secondary | ICD-10-CM | POA: Diagnosis not present

## 2024-05-12 DIAGNOSIS — E669 Obesity, unspecified: Secondary | ICD-10-CM | POA: Diagnosis not present

## 2024-05-12 DIAGNOSIS — L732 Hidradenitis suppurativa: Secondary | ICD-10-CM | POA: Diagnosis not present

## 2024-05-12 DIAGNOSIS — M810 Age-related osteoporosis without current pathological fracture: Secondary | ICD-10-CM | POA: Diagnosis not present

## 2024-05-12 DIAGNOSIS — Z6841 Body Mass Index (BMI) 40.0 and over, adult: Secondary | ICD-10-CM | POA: Diagnosis not present

## 2024-05-12 DIAGNOSIS — G4730 Sleep apnea, unspecified: Secondary | ICD-10-CM | POA: Diagnosis not present

## 2024-05-13 DIAGNOSIS — G43909 Migraine, unspecified, not intractable, without status migrainosus: Secondary | ICD-10-CM | POA: Diagnosis not present

## 2024-05-13 DIAGNOSIS — K580 Irritable bowel syndrome with diarrhea: Secondary | ICD-10-CM | POA: Diagnosis not present

## 2024-05-13 DIAGNOSIS — R1310 Dysphagia, unspecified: Secondary | ICD-10-CM | POA: Diagnosis not present

## 2024-05-13 DIAGNOSIS — M160 Bilateral primary osteoarthritis of hip: Secondary | ICD-10-CM | POA: Diagnosis not present

## 2024-05-13 DIAGNOSIS — F411 Generalized anxiety disorder: Secondary | ICD-10-CM | POA: Diagnosis not present

## 2024-05-13 DIAGNOSIS — N952 Postmenopausal atrophic vaginitis: Secondary | ICD-10-CM | POA: Diagnosis not present

## 2024-05-13 DIAGNOSIS — G8929 Other chronic pain: Secondary | ICD-10-CM | POA: Diagnosis not present

## 2024-05-13 DIAGNOSIS — Z7951 Long term (current) use of inhaled steroids: Secondary | ICD-10-CM | POA: Diagnosis not present

## 2024-05-13 DIAGNOSIS — E669 Obesity, unspecified: Secondary | ICD-10-CM | POA: Diagnosis not present

## 2024-05-13 DIAGNOSIS — Z6841 Body Mass Index (BMI) 40.0 and over, adult: Secondary | ICD-10-CM | POA: Diagnosis not present

## 2024-05-13 DIAGNOSIS — M810 Age-related osteoporosis without current pathological fracture: Secondary | ICD-10-CM | POA: Diagnosis not present

## 2024-05-13 DIAGNOSIS — J4489 Other specified chronic obstructive pulmonary disease: Secondary | ICD-10-CM | POA: Diagnosis not present

## 2024-05-13 DIAGNOSIS — M62838 Other muscle spasm: Secondary | ICD-10-CM | POA: Diagnosis not present

## 2024-05-13 DIAGNOSIS — L732 Hidradenitis suppurativa: Secondary | ICD-10-CM | POA: Diagnosis not present

## 2024-05-13 DIAGNOSIS — G4730 Sleep apnea, unspecified: Secondary | ICD-10-CM | POA: Diagnosis not present

## 2024-05-13 DIAGNOSIS — M170 Bilateral primary osteoarthritis of knee: Secondary | ICD-10-CM | POA: Diagnosis not present

## 2024-05-13 DIAGNOSIS — F17210 Nicotine dependence, cigarettes, uncomplicated: Secondary | ICD-10-CM | POA: Diagnosis not present

## 2024-05-13 DIAGNOSIS — I110 Hypertensive heart disease with heart failure: Secondary | ICD-10-CM | POA: Diagnosis not present

## 2024-05-13 DIAGNOSIS — E119 Type 2 diabetes mellitus without complications: Secondary | ICD-10-CM | POA: Diagnosis not present

## 2024-05-13 DIAGNOSIS — F32A Depression, unspecified: Secondary | ICD-10-CM | POA: Diagnosis not present

## 2024-05-13 DIAGNOSIS — M47816 Spondylosis without myelopathy or radiculopathy, lumbar region: Secondary | ICD-10-CM | POA: Diagnosis not present

## 2024-05-13 DIAGNOSIS — E781 Pure hyperglyceridemia: Secondary | ICD-10-CM | POA: Diagnosis not present

## 2024-05-13 DIAGNOSIS — R413 Other amnesia: Secondary | ICD-10-CM | POA: Diagnosis not present

## 2024-05-13 DIAGNOSIS — Z87440 Personal history of urinary (tract) infections: Secondary | ICD-10-CM | POA: Diagnosis not present

## 2024-05-13 DIAGNOSIS — I509 Heart failure, unspecified: Secondary | ICD-10-CM | POA: Diagnosis not present

## 2024-05-14 ENCOUNTER — Other Ambulatory Visit: Payer: Self-pay | Admitting: Student

## 2024-05-14 DIAGNOSIS — Z87440 Personal history of urinary (tract) infections: Secondary | ICD-10-CM | POA: Diagnosis not present

## 2024-05-14 DIAGNOSIS — M810 Age-related osteoporosis without current pathological fracture: Secondary | ICD-10-CM | POA: Diagnosis not present

## 2024-05-14 DIAGNOSIS — G43909 Migraine, unspecified, not intractable, without status migrainosus: Secondary | ICD-10-CM | POA: Diagnosis not present

## 2024-05-14 DIAGNOSIS — F32A Depression, unspecified: Secondary | ICD-10-CM | POA: Diagnosis not present

## 2024-05-14 DIAGNOSIS — I509 Heart failure, unspecified: Secondary | ICD-10-CM | POA: Diagnosis not present

## 2024-05-14 DIAGNOSIS — E781 Pure hyperglyceridemia: Secondary | ICD-10-CM | POA: Diagnosis not present

## 2024-05-14 DIAGNOSIS — G4730 Sleep apnea, unspecified: Secondary | ICD-10-CM | POA: Diagnosis not present

## 2024-05-14 DIAGNOSIS — G8929 Other chronic pain: Secondary | ICD-10-CM | POA: Diagnosis not present

## 2024-05-14 DIAGNOSIS — F17210 Nicotine dependence, cigarettes, uncomplicated: Secondary | ICD-10-CM | POA: Diagnosis not present

## 2024-05-14 DIAGNOSIS — M170 Bilateral primary osteoarthritis of knee: Secondary | ICD-10-CM | POA: Diagnosis not present

## 2024-05-14 DIAGNOSIS — N952 Postmenopausal atrophic vaginitis: Secondary | ICD-10-CM | POA: Diagnosis not present

## 2024-05-14 DIAGNOSIS — E039 Hypothyroidism, unspecified: Secondary | ICD-10-CM

## 2024-05-14 DIAGNOSIS — R413 Other amnesia: Secondary | ICD-10-CM | POA: Diagnosis not present

## 2024-05-14 DIAGNOSIS — J4489 Other specified chronic obstructive pulmonary disease: Secondary | ICD-10-CM | POA: Diagnosis not present

## 2024-05-14 DIAGNOSIS — L732 Hidradenitis suppurativa: Secondary | ICD-10-CM | POA: Diagnosis not present

## 2024-05-14 DIAGNOSIS — I110 Hypertensive heart disease with heart failure: Secondary | ICD-10-CM | POA: Diagnosis not present

## 2024-05-14 DIAGNOSIS — E669 Obesity, unspecified: Secondary | ICD-10-CM | POA: Diagnosis not present

## 2024-05-14 DIAGNOSIS — R1310 Dysphagia, unspecified: Secondary | ICD-10-CM | POA: Diagnosis not present

## 2024-05-14 DIAGNOSIS — M62838 Other muscle spasm: Secondary | ICD-10-CM | POA: Diagnosis not present

## 2024-05-14 DIAGNOSIS — E119 Type 2 diabetes mellitus without complications: Secondary | ICD-10-CM | POA: Diagnosis not present

## 2024-05-14 DIAGNOSIS — F411 Generalized anxiety disorder: Secondary | ICD-10-CM | POA: Diagnosis not present

## 2024-05-14 DIAGNOSIS — M47816 Spondylosis without myelopathy or radiculopathy, lumbar region: Secondary | ICD-10-CM | POA: Diagnosis not present

## 2024-05-14 DIAGNOSIS — M160 Bilateral primary osteoarthritis of hip: Secondary | ICD-10-CM | POA: Diagnosis not present

## 2024-05-14 DIAGNOSIS — Z7951 Long term (current) use of inhaled steroids: Secondary | ICD-10-CM | POA: Diagnosis not present

## 2024-05-14 DIAGNOSIS — K580 Irritable bowel syndrome with diarrhea: Secondary | ICD-10-CM | POA: Diagnosis not present

## 2024-05-14 DIAGNOSIS — Z6841 Body Mass Index (BMI) 40.0 and over, adult: Secondary | ICD-10-CM | POA: Diagnosis not present

## 2024-05-15 NOTE — Telephone Encounter (Signed)
 NO LONGER [facility] PATIENT

## 2024-05-18 DIAGNOSIS — K580 Irritable bowel syndrome with diarrhea: Secondary | ICD-10-CM | POA: Diagnosis not present

## 2024-05-18 DIAGNOSIS — L732 Hidradenitis suppurativa: Secondary | ICD-10-CM | POA: Diagnosis not present

## 2024-05-18 DIAGNOSIS — R1310 Dysphagia, unspecified: Secondary | ICD-10-CM | POA: Diagnosis not present

## 2024-05-18 DIAGNOSIS — F411 Generalized anxiety disorder: Secondary | ICD-10-CM | POA: Diagnosis not present

## 2024-05-18 DIAGNOSIS — F17210 Nicotine dependence, cigarettes, uncomplicated: Secondary | ICD-10-CM | POA: Diagnosis not present

## 2024-05-18 DIAGNOSIS — Z7951 Long term (current) use of inhaled steroids: Secondary | ICD-10-CM | POA: Diagnosis not present

## 2024-05-18 DIAGNOSIS — R413 Other amnesia: Secondary | ICD-10-CM | POA: Diagnosis not present

## 2024-05-18 DIAGNOSIS — E119 Type 2 diabetes mellitus without complications: Secondary | ICD-10-CM | POA: Diagnosis not present

## 2024-05-18 DIAGNOSIS — I509 Heart failure, unspecified: Secondary | ICD-10-CM | POA: Diagnosis not present

## 2024-05-18 DIAGNOSIS — M62838 Other muscle spasm: Secondary | ICD-10-CM | POA: Diagnosis not present

## 2024-05-18 DIAGNOSIS — I110 Hypertensive heart disease with heart failure: Secondary | ICD-10-CM | POA: Diagnosis not present

## 2024-05-18 DIAGNOSIS — G4730 Sleep apnea, unspecified: Secondary | ICD-10-CM | POA: Diagnosis not present

## 2024-05-18 DIAGNOSIS — G43909 Migraine, unspecified, not intractable, without status migrainosus: Secondary | ICD-10-CM | POA: Diagnosis not present

## 2024-05-18 DIAGNOSIS — G8929 Other chronic pain: Secondary | ICD-10-CM | POA: Diagnosis not present

## 2024-05-18 DIAGNOSIS — N952 Postmenopausal atrophic vaginitis: Secondary | ICD-10-CM | POA: Diagnosis not present

## 2024-05-18 DIAGNOSIS — Z87440 Personal history of urinary (tract) infections: Secondary | ICD-10-CM | POA: Diagnosis not present

## 2024-05-18 DIAGNOSIS — M160 Bilateral primary osteoarthritis of hip: Secondary | ICD-10-CM | POA: Diagnosis not present

## 2024-05-18 DIAGNOSIS — J4489 Other specified chronic obstructive pulmonary disease: Secondary | ICD-10-CM | POA: Diagnosis not present

## 2024-05-18 DIAGNOSIS — Z6841 Body Mass Index (BMI) 40.0 and over, adult: Secondary | ICD-10-CM | POA: Diagnosis not present

## 2024-05-18 DIAGNOSIS — M47816 Spondylosis without myelopathy or radiculopathy, lumbar region: Secondary | ICD-10-CM | POA: Diagnosis not present

## 2024-05-18 DIAGNOSIS — E781 Pure hyperglyceridemia: Secondary | ICD-10-CM | POA: Diagnosis not present

## 2024-05-18 DIAGNOSIS — M810 Age-related osteoporosis without current pathological fracture: Secondary | ICD-10-CM | POA: Diagnosis not present

## 2024-05-18 DIAGNOSIS — F32A Depression, unspecified: Secondary | ICD-10-CM | POA: Diagnosis not present

## 2024-05-18 DIAGNOSIS — E669 Obesity, unspecified: Secondary | ICD-10-CM | POA: Diagnosis not present

## 2024-05-18 DIAGNOSIS — M170 Bilateral primary osteoarthritis of knee: Secondary | ICD-10-CM | POA: Diagnosis not present

## 2024-05-19 DIAGNOSIS — E119 Type 2 diabetes mellitus without complications: Secondary | ICD-10-CM | POA: Diagnosis not present

## 2024-05-19 DIAGNOSIS — M47816 Spondylosis without myelopathy or radiculopathy, lumbar region: Secondary | ICD-10-CM | POA: Diagnosis not present

## 2024-05-19 DIAGNOSIS — E669 Obesity, unspecified: Secondary | ICD-10-CM | POA: Diagnosis not present

## 2024-05-19 DIAGNOSIS — M170 Bilateral primary osteoarthritis of knee: Secondary | ICD-10-CM | POA: Diagnosis not present

## 2024-05-19 DIAGNOSIS — Z7951 Long term (current) use of inhaled steroids: Secondary | ICD-10-CM | POA: Diagnosis not present

## 2024-05-19 DIAGNOSIS — F341 Dysthymic disorder: Secondary | ICD-10-CM | POA: Diagnosis not present

## 2024-05-19 DIAGNOSIS — N952 Postmenopausal atrophic vaginitis: Secondary | ICD-10-CM | POA: Diagnosis not present

## 2024-05-19 DIAGNOSIS — J4489 Other specified chronic obstructive pulmonary disease: Secondary | ICD-10-CM | POA: Diagnosis not present

## 2024-05-19 DIAGNOSIS — F32A Depression, unspecified: Secondary | ICD-10-CM | POA: Diagnosis not present

## 2024-05-19 DIAGNOSIS — R413 Other amnesia: Secondary | ICD-10-CM | POA: Diagnosis not present

## 2024-05-19 DIAGNOSIS — L732 Hidradenitis suppurativa: Secondary | ICD-10-CM | POA: Diagnosis not present

## 2024-05-19 DIAGNOSIS — R1310 Dysphagia, unspecified: Secondary | ICD-10-CM | POA: Diagnosis not present

## 2024-05-19 DIAGNOSIS — G8929 Other chronic pain: Secondary | ICD-10-CM | POA: Diagnosis not present

## 2024-05-19 DIAGNOSIS — Z87440 Personal history of urinary (tract) infections: Secondary | ICD-10-CM | POA: Diagnosis not present

## 2024-05-19 DIAGNOSIS — M810 Age-related osteoporosis without current pathological fracture: Secondary | ICD-10-CM | POA: Diagnosis not present

## 2024-05-19 DIAGNOSIS — G43909 Migraine, unspecified, not intractable, without status migrainosus: Secondary | ICD-10-CM | POA: Diagnosis not present

## 2024-05-19 DIAGNOSIS — F411 Generalized anxiety disorder: Secondary | ICD-10-CM | POA: Diagnosis not present

## 2024-05-19 DIAGNOSIS — Z6841 Body Mass Index (BMI) 40.0 and over, adult: Secondary | ICD-10-CM | POA: Diagnosis not present

## 2024-05-19 DIAGNOSIS — M62838 Other muscle spasm: Secondary | ICD-10-CM | POA: Diagnosis not present

## 2024-05-19 DIAGNOSIS — M160 Bilateral primary osteoarthritis of hip: Secondary | ICD-10-CM | POA: Diagnosis not present

## 2024-05-19 DIAGNOSIS — F17210 Nicotine dependence, cigarettes, uncomplicated: Secondary | ICD-10-CM | POA: Diagnosis not present

## 2024-05-19 DIAGNOSIS — I110 Hypertensive heart disease with heart failure: Secondary | ICD-10-CM | POA: Diagnosis not present

## 2024-05-19 DIAGNOSIS — E781 Pure hyperglyceridemia: Secondary | ICD-10-CM | POA: Diagnosis not present

## 2024-05-19 DIAGNOSIS — K580 Irritable bowel syndrome with diarrhea: Secondary | ICD-10-CM | POA: Diagnosis not present

## 2024-05-19 DIAGNOSIS — G4730 Sleep apnea, unspecified: Secondary | ICD-10-CM | POA: Diagnosis not present

## 2024-05-19 DIAGNOSIS — I509 Heart failure, unspecified: Secondary | ICD-10-CM | POA: Diagnosis not present

## 2024-05-22 DIAGNOSIS — E1159 Type 2 diabetes mellitus with other circulatory complications: Secondary | ICD-10-CM | POA: Diagnosis not present

## 2024-05-22 DIAGNOSIS — I152 Hypertension secondary to endocrine disorders: Secondary | ICD-10-CM | POA: Diagnosis not present

## 2024-05-25 DIAGNOSIS — F411 Generalized anxiety disorder: Secondary | ICD-10-CM | POA: Diagnosis not present

## 2024-05-25 DIAGNOSIS — F341 Dysthymic disorder: Secondary | ICD-10-CM | POA: Diagnosis not present

## 2024-05-26 DIAGNOSIS — R1310 Dysphagia, unspecified: Secondary | ICD-10-CM | POA: Diagnosis not present

## 2024-05-26 DIAGNOSIS — L732 Hidradenitis suppurativa: Secondary | ICD-10-CM | POA: Diagnosis not present

## 2024-05-26 DIAGNOSIS — Z6841 Body Mass Index (BMI) 40.0 and over, adult: Secondary | ICD-10-CM | POA: Diagnosis not present

## 2024-05-26 DIAGNOSIS — R413 Other amnesia: Secondary | ICD-10-CM | POA: Diagnosis not present

## 2024-05-26 DIAGNOSIS — E781 Pure hyperglyceridemia: Secondary | ICD-10-CM | POA: Diagnosis not present

## 2024-05-26 DIAGNOSIS — F17210 Nicotine dependence, cigarettes, uncomplicated: Secondary | ICD-10-CM | POA: Diagnosis not present

## 2024-05-26 DIAGNOSIS — Z7951 Long term (current) use of inhaled steroids: Secondary | ICD-10-CM | POA: Diagnosis not present

## 2024-05-26 DIAGNOSIS — E119 Type 2 diabetes mellitus without complications: Secondary | ICD-10-CM | POA: Diagnosis not present

## 2024-05-26 DIAGNOSIS — M160 Bilateral primary osteoarthritis of hip: Secondary | ICD-10-CM | POA: Diagnosis not present

## 2024-05-26 DIAGNOSIS — F32A Depression, unspecified: Secondary | ICD-10-CM | POA: Diagnosis not present

## 2024-05-26 DIAGNOSIS — K580 Irritable bowel syndrome with diarrhea: Secondary | ICD-10-CM | POA: Diagnosis not present

## 2024-05-26 DIAGNOSIS — I509 Heart failure, unspecified: Secondary | ICD-10-CM | POA: Diagnosis not present

## 2024-05-26 DIAGNOSIS — M62838 Other muscle spasm: Secondary | ICD-10-CM | POA: Diagnosis not present

## 2024-05-26 DIAGNOSIS — N952 Postmenopausal atrophic vaginitis: Secondary | ICD-10-CM | POA: Diagnosis not present

## 2024-05-26 DIAGNOSIS — J4489 Other specified chronic obstructive pulmonary disease: Secondary | ICD-10-CM | POA: Diagnosis not present

## 2024-05-26 DIAGNOSIS — M810 Age-related osteoporosis without current pathological fracture: Secondary | ICD-10-CM | POA: Diagnosis not present

## 2024-05-26 DIAGNOSIS — G43909 Migraine, unspecified, not intractable, without status migrainosus: Secondary | ICD-10-CM | POA: Diagnosis not present

## 2024-05-26 DIAGNOSIS — M170 Bilateral primary osteoarthritis of knee: Secondary | ICD-10-CM | POA: Diagnosis not present

## 2024-05-26 DIAGNOSIS — Z87440 Personal history of urinary (tract) infections: Secondary | ICD-10-CM | POA: Diagnosis not present

## 2024-05-26 DIAGNOSIS — M47816 Spondylosis without myelopathy or radiculopathy, lumbar region: Secondary | ICD-10-CM | POA: Diagnosis not present

## 2024-05-26 DIAGNOSIS — G8929 Other chronic pain: Secondary | ICD-10-CM | POA: Diagnosis not present

## 2024-05-26 DIAGNOSIS — E669 Obesity, unspecified: Secondary | ICD-10-CM | POA: Diagnosis not present

## 2024-05-26 DIAGNOSIS — F411 Generalized anxiety disorder: Secondary | ICD-10-CM | POA: Diagnosis not present

## 2024-05-26 DIAGNOSIS — I110 Hypertensive heart disease with heart failure: Secondary | ICD-10-CM | POA: Diagnosis not present

## 2024-05-26 DIAGNOSIS — G4730 Sleep apnea, unspecified: Secondary | ICD-10-CM | POA: Diagnosis not present

## 2024-05-27 DIAGNOSIS — I110 Hypertensive heart disease with heart failure: Secondary | ICD-10-CM | POA: Diagnosis not present

## 2024-05-27 DIAGNOSIS — E119 Type 2 diabetes mellitus without complications: Secondary | ICD-10-CM | POA: Diagnosis not present

## 2024-05-27 DIAGNOSIS — Z6841 Body Mass Index (BMI) 40.0 and over, adult: Secondary | ICD-10-CM | POA: Diagnosis not present

## 2024-05-27 DIAGNOSIS — M160 Bilateral primary osteoarthritis of hip: Secondary | ICD-10-CM | POA: Diagnosis not present

## 2024-05-27 DIAGNOSIS — K580 Irritable bowel syndrome with diarrhea: Secondary | ICD-10-CM | POA: Diagnosis not present

## 2024-05-27 DIAGNOSIS — F17210 Nicotine dependence, cigarettes, uncomplicated: Secondary | ICD-10-CM | POA: Diagnosis not present

## 2024-05-27 DIAGNOSIS — E781 Pure hyperglyceridemia: Secondary | ICD-10-CM | POA: Diagnosis not present

## 2024-05-27 DIAGNOSIS — G4730 Sleep apnea, unspecified: Secondary | ICD-10-CM | POA: Diagnosis not present

## 2024-05-27 DIAGNOSIS — M170 Bilateral primary osteoarthritis of knee: Secondary | ICD-10-CM | POA: Diagnosis not present

## 2024-05-27 DIAGNOSIS — G8929 Other chronic pain: Secondary | ICD-10-CM | POA: Diagnosis not present

## 2024-05-27 DIAGNOSIS — F32A Depression, unspecified: Secondary | ICD-10-CM | POA: Diagnosis not present

## 2024-05-27 DIAGNOSIS — Z7951 Long term (current) use of inhaled steroids: Secondary | ICD-10-CM | POA: Diagnosis not present

## 2024-05-27 DIAGNOSIS — Z87440 Personal history of urinary (tract) infections: Secondary | ICD-10-CM | POA: Diagnosis not present

## 2024-05-27 DIAGNOSIS — I509 Heart failure, unspecified: Secondary | ICD-10-CM | POA: Diagnosis not present

## 2024-05-27 DIAGNOSIS — R413 Other amnesia: Secondary | ICD-10-CM | POA: Diagnosis not present

## 2024-05-27 DIAGNOSIS — G43909 Migraine, unspecified, not intractable, without status migrainosus: Secondary | ICD-10-CM | POA: Diagnosis not present

## 2024-05-27 DIAGNOSIS — J4489 Other specified chronic obstructive pulmonary disease: Secondary | ICD-10-CM | POA: Diagnosis not present

## 2024-05-27 DIAGNOSIS — N952 Postmenopausal atrophic vaginitis: Secondary | ICD-10-CM | POA: Diagnosis not present

## 2024-05-27 DIAGNOSIS — F411 Generalized anxiety disorder: Secondary | ICD-10-CM | POA: Diagnosis not present

## 2024-05-27 DIAGNOSIS — L732 Hidradenitis suppurativa: Secondary | ICD-10-CM | POA: Diagnosis not present

## 2024-05-27 DIAGNOSIS — R1310 Dysphagia, unspecified: Secondary | ICD-10-CM | POA: Diagnosis not present

## 2024-05-27 DIAGNOSIS — E669 Obesity, unspecified: Secondary | ICD-10-CM | POA: Diagnosis not present

## 2024-05-27 DIAGNOSIS — M62838 Other muscle spasm: Secondary | ICD-10-CM | POA: Diagnosis not present

## 2024-05-27 DIAGNOSIS — M810 Age-related osteoporosis without current pathological fracture: Secondary | ICD-10-CM | POA: Diagnosis not present

## 2024-05-27 DIAGNOSIS — M47816 Spondylosis without myelopathy or radiculopathy, lumbar region: Secondary | ICD-10-CM | POA: Diagnosis not present

## 2024-05-28 DIAGNOSIS — J449 Chronic obstructive pulmonary disease, unspecified: Secondary | ICD-10-CM | POA: Diagnosis not present

## 2024-05-28 DIAGNOSIS — I517 Cardiomegaly: Secondary | ICD-10-CM | POA: Diagnosis not present

## 2024-05-28 DIAGNOSIS — F411 Generalized anxiety disorder: Secondary | ICD-10-CM | POA: Diagnosis not present

## 2024-05-28 DIAGNOSIS — F341 Dysthymic disorder: Secondary | ICD-10-CM | POA: Diagnosis not present

## 2024-05-28 DIAGNOSIS — R9431 Abnormal electrocardiogram [ECG] [EKG]: Secondary | ICD-10-CM | POA: Diagnosis not present

## 2024-05-28 DIAGNOSIS — R0602 Shortness of breath: Secondary | ICD-10-CM | POA: Diagnosis not present

## 2024-05-28 DIAGNOSIS — R609 Edema, unspecified: Secondary | ICD-10-CM | POA: Diagnosis not present

## 2024-05-28 DIAGNOSIS — I5032 Chronic diastolic (congestive) heart failure: Secondary | ICD-10-CM | POA: Diagnosis not present

## 2024-05-28 DIAGNOSIS — F17200 Nicotine dependence, unspecified, uncomplicated: Secondary | ICD-10-CM | POA: Diagnosis not present

## 2024-05-28 DIAGNOSIS — R5381 Other malaise: Secondary | ICD-10-CM | POA: Diagnosis not present

## 2024-06-01 ENCOUNTER — Other Ambulatory Visit: Payer: Self-pay | Admitting: Student

## 2024-06-01 ENCOUNTER — Ambulatory Visit: Admitting: Pulmonary Disease

## 2024-06-01 DIAGNOSIS — F341 Dysthymic disorder: Secondary | ICD-10-CM | POA: Diagnosis not present

## 2024-06-01 DIAGNOSIS — F411 Generalized anxiety disorder: Secondary | ICD-10-CM | POA: Diagnosis not present

## 2024-06-01 DIAGNOSIS — J3089 Other allergic rhinitis: Secondary | ICD-10-CM

## 2024-06-02 NOTE — Telephone Encounter (Signed)
 NO LONGER [facility] PATIENT

## 2024-06-04 ENCOUNTER — Ambulatory Visit (HOSPITAL_BASED_OUTPATIENT_CLINIC_OR_DEPARTMENT_OTHER): Admitting: Pulmonary Disease

## 2024-06-11 DIAGNOSIS — F341 Dysthymic disorder: Secondary | ICD-10-CM | POA: Diagnosis not present

## 2024-06-11 DIAGNOSIS — F411 Generalized anxiety disorder: Secondary | ICD-10-CM | POA: Diagnosis not present

## 2024-06-11 NOTE — Progress Notes (Deleted)
 New Patient Pulmonology Office Visit   Subjective:  Patient ID: Brianna Roberts, female    DOB: 08/01/1965  MRN: [id number]  Referred by: Jerome Heron Ruth, PA-C  CC: No chief complaint on file.   HPI Brianna Roberts is a 58 y.o. female with *** for shortness of breath.  {PULM QUESTIONNAIRES (Optional):33196}  ROS  Allergies: Hydromorphone, Iodinated contrast media, Sulfa antibiotics, Codeine, Nickel, Penicillins, Spironolactone, Cefdinir, and Adhesive [tape] Current Medications[1] Past Medical History:  Diagnosis Date   Allergic rhinitis 10/05/2016   Perennial.  To everything.  Has seen allergists and had testing, which indicated that her only allergy is to mouse hair.     Allergy    Anxiety    Arthritis    Asthma    Asthma, chronic 10/05/2016   Reports annual bronchial infection requiring hospitalization.     Bilateral leg edema 10/05/2016   Work-up did not identify cause.        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 Sharp pain in bilateral frontal and temple area of the head. Pain is constant and waxes and wanes. 7/10 to 10/10. +photophobia. Intermittent left eye vision loss.    History:  MRI Of brain was unremarkable. Pt still suffers from cognitive issues. She states because of insurance change she has not been able to follow up with her re   Neuromuscular disorder (HCC)    Ovarian cyst 10/05/2016    RIGHT sided.  Causes pain.     Positive HTLV-1 antibody 04/22/2023   Thyroid  disease    TIA (transient ischemic attack)    Past Surgical History:  Procedure Laterality Date   ABDOMINAL HYSTERECTOMY     Partial-ovaries and tubes remain   BREAST BIOPSY Left    CHOLECYSTECTOMY  2006   COLONOSCOPY  07/27/2015   [place]   HERNIA REPAIR  1997   TUMOR EXCISION Right 1984   Sinuses   TUMOR EXCISION Left 2015   sinuses   URACHAL CYST EXCISION  1997   Family History  Problem Relation Age of Onset   Diabetes Mother    Heart disease Mother    Hyperlipidemia Mother    Hypertension Mother    Stroke Mother    Mental illness Mother    Heart failure Mother    Headache Mother        Johnsie Finder Cyst   Breast cancer Mother 59       diagnosed 3 times   Heart disease Father    Alcohol  abuse Father    Hypertension Sister    Heart disease Brother    Mental illness Brother    COPD Brother    Heart failure Brother    Hypertension Sister    Colon cancer Neg Hx    Social History   Socioeconomic History   Marital status: Widowed    Spouse  name: [name]   Number of children: 0   Years of education: college+   Highest education level: Bachelor's degree (e.g., BA, AB, BS)  Occupational History   Occupation: disability    Comment: fibromyalgia  Tobacco Use   Smoking status: Every Day    Current packs/day: 0.30    Average packs/day: 0.3 packs/day for 33.0 years (9.9 ttl pk-yrs)    Types: Cigarettes   Smokeless tobacco: Never  Vaping Use   Vaping status: Never Used  Substance and Sexual Activity   Alcohol  use: No   Drug use: No   Sexual activity: Never  Other Topics Concern   Not on file  Social History Narrative   Moved to [place], [place] from [place], [place]  04/28/2016 in need of a change.   Her family remains in [place].   Lives alone.   Social Drivers of Health   Tobacco Use: High Risk (06/01/2024)   Received from [facility]   Patient History     Smoking Tobacco Use: Every Day    Smokeless Tobacco Use: Never    Passive Exposure: Never  Financial Resource Strain: High Risk (05/21/2024)   Received from [facility]   Overall Financial Resource Strain (CARDIA)    How hard is it for you to pay for the very basics like food, housing, medical care, and heating?: Very hard  Food Insecurity: Food Insecurity Present (05/21/2024)   Received from [facility]   Epic    Within the past 12 months, you worried that your food would run out before you got the money to buy more.: Often true    Within the past 12 months, the food you bought just didn't last and you didn't have money to get more.: Often true  Transportation Needs: Unmet Transportation Needs (05/21/2024)   Received from [facility]    In the past 12 months, has lack of transportation kept you from medical appointments or from getting medications?: Yes    Lack of Transportation (Non-Medical): Not on file  Physical Activity: Inactive (05/21/2024)   Received from [facility]   Exercise Vital Sign    On average, how many days per week do you engage in moderate to strenuous exercise (like a brisk walk)?: 0 days    Minutes of Exercise per Session: Not on file  Stress: Stress Concern Present (05/21/2024)   Received from [facility] - Occupational Stress Questionnaire    Do you feel stress - tense, restless, nervous, or anxious, or unable to sleep at night because your mind is troubled all the time - these days?: Very much  Social Connections: Socially Isolated (05/21/2024)   Received from [facility]   Social Network    How would you rate your social network (family, work, friends)?: Little participation, lonely and socially isolated  Intimate Partner Violence: Patient Declined (04/18/2024)   Received from [facility]   HITS    Over the last 12 months how often did your partner physically hurt you?: Patient declined    Over  the last 12 months how often did your partner insult you or talk down to you?: Patient declined    Over the last 12 months how often did your partner threaten you with physical harm?: Patient declined    Over the last 12 months how often did your partner scream or curse at you?: Patient declined  Depression (PHQ2-9): High Risk (08/30/2023)   Depression (PHQ2-9)    PHQ-2  Score: 24  Alcohol  Screen: Not on file  Housing: High Risk (05/21/2024)   Received from [facility]    In the last 12 months, was there a time when you were not able to pay the mortgage or rent on time?: Yes    Number of Times Moved in the Last Year: Not on file    At any time in the past 12 months, were you homeless or living in a shelter (including now)?: No  Utilities: At Risk (05/21/2024)   Received from [facility]    In the past 12 months has the electric, gas, oil, or water company threatened to shut off services in your home?: Yes  Health Literacy: Not on file       Objective:  There were no vitals taken for this visit. {Pulm Vitals (Optional):32837}  Physical Exam  Diagnostic Review:  {Labs (Optional):32838}     Assessment & Plan:   Assessment & Plan   No orders of the defined types were placed in this encounter.     No follow-ups on file.   Estevan Kersh, MD    [1]  Current Outpatient Medications:    albuterol  (VENTOLIN  HFA) 108 (90 Base) MCG/ACT inhaler, Inhale 1-2 puffs into the lungs every 6 (six) hours as needed for wheezing or shortness of breath., Disp: 1 each, Rfl: 0   amitriptyline  (ELAVIL ) 100 MG tablet, Take 1 tablet (100 mg total) by mouth at bedtime., Disp: 30 tablet, Rfl: 0   amLODipine  (NORVASC ) 10 MG tablet, Take 1 tablet (10 mg total) by mouth daily., Disp: 90 tablet, Rfl: 3   clindamycin (CLINDAGEL) 1 % gel, Apply 1 Application topically 2 (two) times daily., Disp: , Rfl:    diazepam  (VALIUM ) 2 MG tablet, Take 2 mg by mouth 2 (two) times daily., Disp: ,  Rfl:    diazepam  (VALIUM ) 5 MG tablet, Take 1 tablet (5 mg total) by mouth every 8 (eight) hours as needed for anxiety., Disp: 60 tablet, Rfl: 0   dicyclomine  (BENTYL ) 20 MG tablet, Take 1 tablet (20 mg total) by mouth 2 (two) times daily., Disp: 20 tablet, Rfl: 0   fluticasone  (FLONASE ) 50 MCG/ACT nasal spray, Place 1 spray into both nostrils daily., Disp: 16 g, Rfl: 11   Fluticasone -Umeclidin-Vilant (TRELEGY ELLIPTA ) 200-62.5-25 MCG/ACT AEPB, Inhale 1 Inhalation into the lungs daily at 6 (six) AM., Disp: 60 each, Rfl: 1   furosemide  (LASIX ) 40 MG tablet, Take 1 tablet (40 mg total) by mouth 2 (two) times daily., Disp: 60 tablet, Rfl: 11   hydrOXYzine  (ATARAX ) 10 MG tablet, Take 10 mg by mouth 2 (two) times daily., Disp: , Rfl:    levothyroxine  (SYNTHROID ) 50 MCG tablet, Take 1 tablet (50 mcg total) by mouth daily before breakfast., Disp: 90 tablet, Rfl: 3   meclizine  (ANTIVERT ) 25 MG tablet, Take 1 tablet (25 mg total) by mouth 3 (three) times daily as needed for dizziness., Disp: 30 tablet, Rfl: 0   meloxicam  (MOBIC ) 7.5 MG tablet, TAKE 2 TABLETS BY MOUTH IN THE MORNING AND AT BEDTIME, Disp: 120 tablet, Rfl: 0   metoprolol  tartrate (LOPRESSOR ) 100 MG tablet, Take 1 tablet (100 mg total) by mouth 2 (two) times daily., Disp: 180 tablet, Rfl: 3   nystatin  cream (MYCOSTATIN ), Apply 1 Application topically 2 (two) times daily., Disp: 30 g, Rfl: 11   ondansetron  (ZOFRAN -ODT) 8 MG disintegrating tablet, Take 1 tablet (8 mg total) by mouth every 8 (eight) hours as needed for  nausea or vomiting., Disp: 20 tablet, Rfl: 0   oxyCODONE -acetaminophen  (PERCOCET) 10-325 MG tablet, Take 1 tablet by mouth daily as needed., Disp: , Rfl:    potassium chloride  (KLOR-CON  M) 10 MEQ tablet, Take 1 tablet (10 mEq total) by mouth 2 (two) times daily., Disp: 60 tablet, Rfl: 3   triamcinolone  (KENALOG ) 0.025 % ointment, Apply 1 Application topically 2 (two) times daily., Disp: 30 g, Rfl: 0   Triamcinolone  Acetonide  (TRIAMCINOLONE  0.1 % CREAM : EUCERIN) CREA, Apply 1 Application topically daily as needed., Disp: 1 each, Rfl: 3   valACYclovir  (VALTREX ) 1000 MG tablet, Take 1 tablet (1,000 mg total) by mouth daily., Disp: 30 tablet, Rfl: 3

## 2024-06-12 ENCOUNTER — Ambulatory Visit (HOSPITAL_BASED_OUTPATIENT_CLINIC_OR_DEPARTMENT_OTHER): Admitting: Pulmonary Disease

## 2024-06-19 DIAGNOSIS — Z5982 Transportation insecurity: Secondary | ICD-10-CM | POA: Diagnosis not present

## 2024-06-19 DIAGNOSIS — F331 Major depressive disorder, recurrent, moderate: Secondary | ICD-10-CM | POA: Diagnosis not present

## 2024-06-19 DIAGNOSIS — G4733 Obstructive sleep apnea (adult) (pediatric): Secondary | ICD-10-CM | POA: Diagnosis not present

## 2024-06-23 ENCOUNTER — Other Ambulatory Visit: Payer: Self-pay | Admitting: Student

## 2024-06-23 ENCOUNTER — Ambulatory Visit (HOSPITAL_BASED_OUTPATIENT_CLINIC_OR_DEPARTMENT_OTHER): Admitting: Pulmonary Disease

## 2024-06-23 NOTE — Telephone Encounter (Signed)
 NO LONGER [facility] PATIENT

## 2024-06-23 NOTE — Progress Notes (Unsigned)
 "  New Patient Pulmonology Office Visit   Subjective:  Patient ID: Brianna Roberts, female    DOB: [birth year]  MRN: [id number]  Referred by: Jerome Heron Ruth, PA-C  CC: No chief complaint on file.   HPI Brianna Roberts is a 58 y.o. female with HTN, HLD, and HFpEF who presents for initial consultation in the setting of dyspnea.  {PULM QUESTIONNAIRES (Optional):33196}  ROS  Allergies: Hydromorphone, Iodinated contrast media, Sulfa antibiotics, Codeine, Nickel, Penicillins, Spironolactone, Cefdinir, and Adhesive [tape] Current Medications[1] Past Medical History:  Diagnosis Date   Allergic rhinitis 10/05/2016   Perennial.  To everything.  Has seen allergists and had testing, which indicated that her only allergy is to mouse hair.     Allergy    Anxiety    Arthritis    Asthma    Asthma, chronic 10/05/2016   Reports annual bronchial infection requiring hospitalization.     Bilateral leg edema 10/05/2016   Work-up did not identify cause.        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 Sharp pain in bilateral frontal and temple area of the head. Pain is constant and waxes and wanes. 7/10 to 10/10. +photophobia. Intermittent left eye vision loss.    History:  MRI Of brain was unremarkable. Pt still suffers from cognitive issues. She states because of insurance change she has not been able to follow up with her re    Neuromuscular disorder (HCC)    Ovarian cyst 10/05/2016   RIGHT sided.  Causes pain.     Positive HTLV-1 antibody 04/22/2023   Thyroid  disease    TIA (transient ischemic attack)    Past Surgical History:  Procedure Laterality Date   ABDOMINAL HYSTERECTOMY     Partial-ovaries and tubes remain   BREAST BIOPSY Left    CHOLECYSTECTOMY  2006   COLONOSCOPY  07/27/2015   [place]   HERNIA REPAIR  1997   TUMOR EXCISION Right 1984   Sinuses   TUMOR EXCISION Left 2015   sinuses   URACHAL CYST EXCISION  1997   Family History  Problem Relation Age of Onset   Diabetes Mother    Heart disease Mother    Hyperlipidemia Mother    Hypertension Mother    Stroke Mother    Mental illness Mother    Heart failure Mother    Headache Mother        Johnsie Finder Cyst   Breast cancer Mother 6       diagnosed 3 times   Heart disease Father    Alcohol  abuse Father    Hypertension Sister    Heart disease Brother    Mental illness Brother    COPD Brother    Heart failure Brother    Hypertension Sister    Colon cancer Neg Hx    Social History   Socioeconomic  History   Marital status: Widowed    Spouse name: [name]   Number of children: 0   Years of education: college+   Highest education level: Bachelor's degree (e.g., BA, AB, BS)  Occupational History   Occupation: disability    Comment: fibromyalgia  Tobacco Use   Smoking status: Every Day    Current packs/day: 0.30    Average packs/day: 0.3 packs/day for 33.0 years (9.9 ttl pk-yrs)    Types: Cigarettes   Smokeless tobacco: Never  Vaping Use   Vaping status: Never Used  Substance and Sexual Activity   Alcohol  use: No   Drug use: No   Sexual activity: Never  Other Topics Concern   Not on file  Social History Narrative   Moved to [place], [place] from [place], [place]  04/28/2016 in need of a change.   Her family remains in [place].   Lives alone.   Social Drivers of Health   Tobacco Use: High Risk  (06/19/2024)   Received from [facility]   Patient History    Smoking Tobacco Use: Every Day    Smokeless Tobacco Use: Never    Passive Exposure: Never  Financial Resource Strain: High Risk (05/21/2024)   Received from [facility]   Overall Financial Resource Strain (CARDIA)    How hard is it for you to pay for the very basics like food, housing, medical care, and heating?: Very hard  Food Insecurity: Food Insecurity Present (05/21/2024)   Received from [facility]   Epic    Within the past 12 months, you worried that your food would run out before you got the money to buy more.: Often true    Within the past 12 months, the food you bought just didn't last and you didn't have money to get more.: Often true  Transportation Needs: Unmet Transportation Needs (05/21/2024)   Received from [facility]    In the past 12 months, has lack of transportation kept you from medical appointments or from getting medications?: Yes    Lack of Transportation (Non-Medical): Not on file  Physical Activity: Inactive (05/21/2024)   Received from [facility]   Exercise Vital Sign    On average, how many days per week do you engage in moderate to strenuous exercise (like a brisk walk)?: 0 days    Minutes of Exercise per Session: Not on file  Stress: Stress Concern Present (05/21/2024)   Received from [facility] - Occupational Stress Questionnaire    Do you feel stress - tense, restless, nervous, or anxious, or unable to sleep at night because your mind is troubled all the time - these days?: Very much  Social Connections: Socially Isolated (05/21/2024)   Received from [facility]   Social Network    How would you rate your social network (family, work, friends)?: Little participation, lonely and socially isolated  Intimate Partner Violence: Patient Declined (04/18/2024)   Received from [facility]   HITS    Over the last 12 months how  often did your partner physically hurt you?: Patient declined    Over the last 12 months how often did your partner insult you or talk down to you?: Patient declined    Over the last 12 months how often did your partner threaten you with physical harm?: Patient declined    Over the last 12 months how often did your partner scream or curse at you?: Patient declined  Depression (PHQ2-9): High  Risk (08/30/2023)   Depression (PHQ2-9)    PHQ-2 Score: 24  Alcohol  Screen: Not on file  Housing: High Risk (05/21/2024)   Received from [facility]    In the last 12 months, was there a time when you were not able to pay the mortgage or rent on time?: Yes    Number of Times Moved in the Last Year: Not on file    At any time in the past 12 months, were you homeless or living in a shelter (including now)?: No  Utilities: At Risk (05/21/2024)   Received from [facility]    In the past 12 months has the electric, gas, oil, or water company threatened to shut off services in your home?: Yes  Health Literacy: Not on file       Objective:  There were no vitals taken for this visit. {Pulm Vitals (Optional):32837}  Physical Exam  Diagnostic Review:  {Labs (Optional):32838}  CXR 04/2024: cardiomegaly, pulmonary vascular congestion  Echo 2019: - Left ventricle: The cavity size was normal. Systolic function was    normal. The estimated ejection fraction was in the range of 60%    to 65%. Wall motion was normal; there were no regional wall    motion abnormalities. Doppler parameters are consistent with    abnormal left ventricular relaxation (grade 1 diastolic    dysfunction).     Assessment & Plan:   Assessment & Plan   No orders of the defined types were placed in this encounter.     No follow-ups on file.   Tavarion Babington, MD    [1]  Current Outpatient Medications:    albuterol  (VENTOLIN  HFA) 108 (90 Base) MCG/ACT inhaler, Inhale 1-2 puffs into the lungs every 6  (six) hours as needed for wheezing or shortness of breath., Disp: 1 each, Rfl: 0   amitriptyline  (ELAVIL ) 100 MG tablet, Take 1 tablet (100 mg total) by mouth at bedtime., Disp: 30 tablet, Rfl: 0   amLODipine  (NORVASC ) 10 MG tablet, Take 1 tablet (10 mg total) by mouth daily., Disp: 90 tablet, Rfl: 3   clindamycin (CLINDAGEL) 1 % gel, Apply 1 Application topically 2 (two) times daily., Disp: , Rfl:    diazepam  (VALIUM ) 2 MG tablet, Take 2 mg by mouth 2 (two) times daily., Disp: , Rfl:    diazepam  (VALIUM ) 5 MG tablet, Take 1 tablet (5 mg total) by mouth every 8 (eight) hours as needed for anxiety., Disp: 60 tablet, Rfl: 0   dicyclomine  (BENTYL ) 20 MG tablet, Take 1 tablet (20 mg total) by mouth 2 (two) times daily., Disp: 20 tablet, Rfl: 0   fluticasone  (FLONASE ) 50 MCG/ACT nasal spray, Place 1 spray into both nostrils daily., Disp: 16 g, Rfl: 11   Fluticasone -Umeclidin-Vilant (TRELEGY ELLIPTA ) 200-62.5-25 MCG/ACT AEPB, Inhale 1 Inhalation into the lungs daily at 6 (six) AM., Disp: 60 each, Rfl: 1   furosemide  (LASIX ) 40 MG tablet, Take 1 tablet (40 mg total) by mouth 2 (two) times daily., Disp: 60 tablet, Rfl: 11   hydrOXYzine  (ATARAX ) 10 MG tablet, Take 10 mg by mouth 2 (two) times daily., Disp: , Rfl:    levothyroxine  (SYNTHROID ) 50 MCG tablet, Take 1 tablet (50 mcg total) by mouth daily before breakfast., Disp: 90 tablet, Rfl: 3   meclizine  (ANTIVERT ) 25 MG tablet, Take 1 tablet (25 mg total) by mouth 3 (three) times daily as needed for dizziness., Disp: 30 tablet, Rfl: 0   meloxicam  (MOBIC ) 7.5 MG  tablet, TAKE 2 TABLETS BY MOUTH IN THE MORNING AND AT BEDTIME, Disp: 120 tablet, Rfl: 0   metoprolol  tartrate (LOPRESSOR ) 100 MG tablet, Take 1 tablet (100 mg total) by mouth 2 (two) times daily., Disp: 180 tablet, Rfl: 3   nystatin  cream (MYCOSTATIN ), Apply 1 Application topically 2 (two) times daily., Disp: 30 g, Rfl: 11   ondansetron  (ZOFRAN -ODT) 8 MG disintegrating tablet, Take 1 tablet (8 mg  total) by mouth every 8 (eight) hours as needed for nausea or vomiting., Disp: 20 tablet, Rfl: 0   oxyCODONE -acetaminophen  (PERCOCET) 10-325 MG tablet, Take 1 tablet by mouth daily as needed., Disp: , Rfl:    potassium chloride  (KLOR-CON  M) 10 MEQ tablet, Take 1 tablet (10 mEq total) by mouth 2 (two) times daily., Disp: 60 tablet, Rfl: 3   triamcinolone  (KENALOG ) 0.025 % ointment, Apply 1 Application topically 2 (two) times daily., Disp: 30 g, Rfl: 0   Triamcinolone  Acetonide (TRIAMCINOLONE  0.1 % CREAM : EUCERIN) CREA, Apply 1 Application topically daily as needed., Disp: 1 each, Rfl: 3   valACYclovir  (VALTREX ) 1000 MG tablet, Take 1 tablet (1,000 mg total) by mouth daily., Disp: 30 tablet, Rfl: 3  "

## 2024-06-24 ENCOUNTER — Encounter (HOSPITAL_BASED_OUTPATIENT_CLINIC_OR_DEPARTMENT_OTHER): Payer: Self-pay | Admitting: Pulmonary Disease

## 2024-06-24 ENCOUNTER — Ambulatory Visit (HOSPITAL_BASED_OUTPATIENT_CLINIC_OR_DEPARTMENT_OTHER): Admitting: Pulmonary Disease

## 2024-06-24 VITALS — BP 113/75 | HR 76 | Ht 69.0 in | Wt 378.0 lb

## 2024-06-24 DIAGNOSIS — J4540 Moderate persistent asthma, uncomplicated: Secondary | ICD-10-CM | POA: Diagnosis not present

## 2024-06-24 DIAGNOSIS — F17210 Nicotine dependence, cigarettes, uncomplicated: Secondary | ICD-10-CM | POA: Diagnosis not present

## 2024-06-24 DIAGNOSIS — G4733 Obstructive sleep apnea (adult) (pediatric): Secondary | ICD-10-CM

## 2024-06-24 DIAGNOSIS — Z6841 Body Mass Index (BMI) 40.0 and over, adult: Secondary | ICD-10-CM | POA: Diagnosis not present

## 2024-06-24 DIAGNOSIS — I5032 Chronic diastolic (congestive) heart failure: Secondary | ICD-10-CM | POA: Diagnosis not present

## 2024-06-24 DIAGNOSIS — F17200 Nicotine dependence, unspecified, uncomplicated: Secondary | ICD-10-CM

## 2024-06-24 LAB — BASIC METABOLIC PANEL WITH GFR
BUN/Creatinine Ratio: 8 — ABNORMAL LOW (ref 9–23)
BUN: 8 mg/dL (ref 6–24)
CO2: 25 mmol/L (ref 20–29)
Calcium: 9.3 mg/dL (ref 8.7–10.2)
Chloride: 104 mmol/L (ref 96–106)
Creatinine, Ser: 0.95 mg/dL (ref 0.57–1.00)
Glucose: 98 mg/dL (ref 70–99)
Potassium: 4.5 mmol/L (ref 3.5–5.2)
Sodium: 144 mmol/L (ref 134–144)
eGFR: 69 mL/min/1.73

## 2024-06-24 MED ORDER — FUROSEMIDE 40 MG PO TABS: ORAL_TABLET | ORAL | 11 refills | Status: AC

## 2024-06-24 NOTE — Assessment & Plan Note (Signed)
 Obstructive sleep apnea Long-standing obstructive sleep apnea with CPAP use since 2012. Symptoms of daytime fatigue and snoring persist. No recent sleep study to reassess CPAP settings. ESS 12, Mallampati IV, STOP BANG 5, Neck circ 15 inches. Will repeat split night sleep study. I suspect patient has OSA versus OHS. - Ordered split night sleep study to reassess CPAP settings. - Will consider new PAP machine based on sleep study results.

## 2024-06-24 NOTE — Patient Instructions (Signed)
" °  VISIT SUMMARY: During your visit, we discussed your worsening shortness of breath, mobility issues, and other health concerns. We reviewed your current medications and made some adjustments to better manage your symptoms. We also ordered several tests to further evaluate your condition and provided recommendations for lifestyle changes to improve your overall health.  YOUR PLAN: CHRONIC DIASTOLIC HEART FAILURE WITH PULMONARY EDEMA: Your heart failure is causing fluid buildup in your lungs and body, leading to shortness of breath and swelling. -Take 80 mg of lasix  in morning and 40 mg in evening on Mondays, Wednesdays and Fridays - Take 40 mg of lasix  in the morning and 40 mg in evening on all other days -Avoid salt intake to prevent fluid retention. -Blood work ordered to check kidney function and potassium levels. -Monitor your weight and adjust diuretics based on weight changes.  MODERATE PERSISTENT ASTHMA: Your asthma symptoms, including wheezing and shortness of breath, may be worsened by mold exposure. -Pulmonary function test ordered to assess for COPD and asthma. -Avoid mold exposure and consider relocating if necessary. -Hold off on using Trelegy until after pulmonary function test results.  OBSTRUCTIVE SLEEP APNEA: You have long-standing sleep apnea and continue to experience daytime fatigue and snoring. -Split night sleep study ordered to reassess CPAP settings. -Consider a new CPAP machine based on sleep study results.  MORBID OBESITY DUE TO EXCESS CALORIES: Your weight fluctuations are likely due to fluid retention, and high processed food intake is contributing to salt consumption. -Reduce processed food intake to lower salt consumption. -Discuss potential for weight loss medications with your primary care provider.  NICOTINE  DEPENDENCE, CURRENT USE: You have been smoking since age 58, which is affecting your respiratory health. -Encouraged to make efforts to quit  smoking.  Contains text generated by [employer]. "

## 2024-06-25 ENCOUNTER — Ambulatory Visit: Payer: Self-pay | Admitting: Pulmonary Disease

## 2024-06-25 DIAGNOSIS — I5032 Chronic diastolic (congestive) heart failure: Secondary | ICD-10-CM

## 2024-06-26 NOTE — Telephone Encounter (Signed)
 Can you please advise?

## 2024-07-03 ENCOUNTER — Encounter (HOSPITAL_BASED_OUTPATIENT_CLINIC_OR_DEPARTMENT_OTHER): Payer: Self-pay | Admitting: Pulmonary Disease

## 2024-07-03 DIAGNOSIS — R600 Localized edema: Secondary | ICD-10-CM

## 2024-07-03 DIAGNOSIS — I5032 Chronic diastolic (congestive) heart failure: Secondary | ICD-10-CM

## 2024-07-06 ENCOUNTER — Encounter (HOSPITAL_BASED_OUTPATIENT_CLINIC_OR_DEPARTMENT_OTHER): Payer: Self-pay

## 2024-07-06 NOTE — Telephone Encounter (Signed)
 Please advise

## 2024-07-06 NOTE — Telephone Encounter (Signed)
 I dont think you ordered the Stress Test please advise on how to proceed

## 2024-07-06 NOTE — Telephone Encounter (Signed)
 Cardiologist at [facility] ordered this test. She needs to contact their office to have the test done in [place] if that's possible.

## 2024-07-07 ENCOUNTER — Ambulatory Visit (HOSPITAL_COMMUNITY)
Admission: RE | Admit: 2024-07-07 | Discharge: 2024-07-07 | Disposition: A | Discharge status: Home / Self Care | Source: Ambulatory Visit | Attending: Pulmonary Disease | Admitting: Pulmonary Disease

## 2024-07-07 ENCOUNTER — Ambulatory Visit: Payer: Self-pay | Admitting: Pulmonary Disease

## 2024-07-07 DIAGNOSIS — R600 Localized edema: Secondary | ICD-10-CM | POA: Diagnosis not present

## 2024-07-10 NOTE — Progress Notes (Unsigned)
" °  Cardiology Office Note:  .   Date:  07/10/2024  ID:  Brianna Roberts, DOB 1966-06-20, MRN [id number] PCP: Brianna Roberts Brianna Roberts  [facility] Providers Cardiologist:  None   History of Present Illness: .   No chief complaint on file.   Brianna Roberts is a 59 y.o. female with below history who presents for the evaluation of HFpEF at the request of Brianna Heron Jama, PA-C.   History of Present Illness               Problem List Obesity  -BMI 54 2. OSA 3. COPD 4. DM -A1c 6.5 5. HFpEF 6. HLD -T chol 138, TG 141, LDL 71, HDL 42    ROS: All other ROS reviewed and negative. Pertinent positives noted in the HPI.     Studies Reviewed: Brianna Roberts       Physical Exam:   VS:  There were no vitals taken for this visit.   Wt Readings from Last 3 Encounters:  06/24/24 (!) 378 lb (171.5 kg)  02/29/24 (!) 360 lb 3.7 oz (163.4 kg)  10/29/23 (!) 360 lb 4.8 oz (163.4 kg)    GEN: Well nourished, well developed in no acute distress NECK: No JVD; No carotid bruits CARDIAC: ***RRR, no murmurs, rubs, gallops RESPIRATORY:  Clear to auscultation without rales, wheezing or rhonchi  ABDOMEN: Soft, non-tender, non-distended EXTREMITIES:  No edema; No deformity  ASSESSMENT AND PLAN: .   Assessment and Plan                 {Are you ordering a CV Procedure (e.g. stress test, cath, DCCV, TEE, etc)?   Press F2        :[id number]}   Follow-up: No follow-ups on file.  Signed, Brianna Roberts. Barbaraann, MD, [facility]  [facility]  [address] [place], [place] [address] [phone]  1:57 PM   "

## 2024-07-13 ENCOUNTER — Ambulatory Visit (HOSPITAL_BASED_OUTPATIENT_CLINIC_OR_DEPARTMENT_OTHER): Admitting: Cardiovascular Disease

## 2024-07-13 DIAGNOSIS — I5032 Chronic diastolic (congestive) heart failure: Secondary | ICD-10-CM

## 2024-07-16 NOTE — Progress Notes (Unsigned)
 " Cardiology Office Note:    Date:  07/16/2024   ID:  [name], Pitcher September 13, 1965, MRN [id number]  PCP:  Jerome Heron Ruth, PA-C   [facility] Providers Cardiologist:  None { Click to update primary MD,subspecialty MD or APP then REFRESH:1}    Referring MD: Catherine Cools, MD   No chief complaint on file. ***  History of Present Illness:    Brianna Roberts is a 59 y.o. female is seen at the request of Dr Catherine for evaluation of diastolic CHF. History of HTN, HIV, asthma.   Past Medical History:  Diagnosis Date   Allergic rhinitis 10/05/2016   Perennial.  To everything.  Has seen allergists and had testing, which indicated that her only allergy is to mouse hair.     Allergy    Anxiety    Arthritis    Asthma    Asthma, chronic 10/05/2016   Reports annual bronchial infection requiring hospitalization.     Bilateral leg edema 10/05/2016   Work-up did not identify cause.        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 Sharp pain in bilateral frontal and temple area of the head. Pain is constant and waxes and wanes. 7/10 to 10/10. +photophobia. Intermittent left eye vision loss.    History:  MRI Of brain was unremarkable. Pt still suffers from cognitive issues. She states because of insurance change she has not been able to follow up with her re   Neuromuscular disorder (HCC)    Ovarian cyst  10/05/2016   RIGHT sided.  Causes pain.     Positive HTLV-1 antibody 04/22/2023   Thyroid  disease    TIA (transient ischemic attack)     Past Surgical History:  Procedure Laterality Date   ABDOMINAL HYSTERECTOMY     Partial-ovaries and tubes remain   BREAST BIOPSY Left    CHOLECYSTECTOMY  2006   COLONOSCOPY  07/27/2015   [place]   HERNIA REPAIR  1997   TUMOR EXCISION Right 1984   Sinuses   TUMOR EXCISION Left 2015   sinuses   URACHAL CYST EXCISION  1997    Current Medications: Active Medications[1]   Allergies:   Hydromorphone, Iodinated contrast media, Sulfa antibiotics, Codeine, Nickel, Penicillins, Spironolactone, Cefdinir, and Adhesive [tape]   Social History   Socioeconomic History   Marital status: Widowed    Spouse name: [name]   Number of children: 0   Years of education: college+   Highest education level: Bachelor's degree (e.g., BA, AB, BS)  Occupational History   Occupation: disability    Comment: fibromyalgia  Tobacco Use   Smoking status: Every Day    Current packs/day: 0.30    Average packs/day: 0.3 packs/day for 33.0 years (9.9 ttl pk-yrs)    Types: Cigarettes   Smokeless tobacco: Never  Vaping Use  Vaping status: Never Used  Substance and Sexual Activity   Alcohol  use: No   Drug use: No   Sexual activity: Never  Other Topics Concern   Not on file  Social History Narrative   Moved to [place], [place] from [place], [place]  04/28/2016 in need of a change.   Her family remains in [place].   Lives alone.   Social Drivers of Health   Tobacco Use: High Risk (06/24/2024)   Patient History    Smoking Tobacco Use: Every Day    Smokeless Tobacco Use: Never    Passive Exposure: Not on file  Financial Resource Strain: High Risk (05/21/2024)   Received from [facility]   Overall Financial Resource Strain (CARDIA)    How hard is it for you to pay for the very basics like food, housing, medical care, and heating?: Very hard   Food Insecurity: Food Insecurity Present (05/21/2024)   Received from [facility]   Epic    Within the past 12 months, you worried that your food would run out before you got the money to buy more.: Often true    Within the past 12 months, the food you bought just didn't last and you didn't have money to get more.: Often true  Transportation Needs: Unmet Transportation Needs (05/21/2024)   Received from [facility]    In the past 12 months, has lack of transportation kept you from medical appointments or from getting medications?: Yes    Lack of Transportation (Non-Medical): Not on file  Physical Activity: Inactive (05/21/2024)   Received from [facility]   Exercise Vital Sign    On average, how many days per week do you engage in moderate to strenuous exercise (like a brisk walk)?: 0 days    Minutes of Exercise per Session: Not on file  Stress: Stress Concern Present (05/21/2024)   Received from [facility] Occupational [facility] - Occupational Stress Questionnaire    Do you feel stress - tense, restless, nervous, or anxious, or unable to sleep at night because your mind is troubled all the time - these days?: Very much  Social Connections: Socially Isolated (05/21/2024)   Received from [facility]   Social Network    How would you rate your social network (family, work, friends)?: Little participation, lonely and socially isolated  Depression (PHQ2-9): High Risk (08/30/2023)   Depression (PHQ2-9)    PHQ-2 Score: 24  Alcohol  Screen: Not on file  Housing: High Risk (05/21/2024)   Received from [facility]    In the last 12 months, was there a time when you were not able to pay the mortgage or rent on time?: Yes    Number of Times Moved in the Last Year: Not on file    At any time in the past 12 months, were you homeless or living in a shelter (including now)?: No  Utilities: At Risk (05/21/2024)   Received from [facility]     In the past 12 months has the electric, gas, oil, or water company threatened to shut off services in your home?: Yes  Health Literacy: Not on file     Family History: The patient's ***family history includes Alcohol  abuse in her father; Breast cancer (age of onset: 26) in her mother; COPD in her brother; Diabetes in her mother; Headache in her mother; Heart disease in her brother, father, and mother; Heart failure in her brother and mother;  Hyperlipidemia in her mother; Hypertension in her mother, sister, and sister; Mental illness in her brother and mother; Stroke in her mother. There is no history of Colon cancer.  ROS:   Please see the history of present illness.    *** All other systems reviewed and are negative.  EKGs/Labs/Other Studies Reviewed:    The following studies were reviewed today: Echo 12/14/17: Study Conclusions   - Left ventricle: The cavity size was normal. Systolic function was    normal. The estimated ejection fraction was in the range of 60%    to 65%. Wall motion was normal; there were no regional wall    motion abnormalities. Doppler parameters are consistent with    abnormal left ventricular relaxation (grade 1 diastolic    dysfunction).       Recent Labs: 05/04/2024: ALT 18; B Natriuretic Peptide 23.1; Hemoglobin 12.9; Magnesium  1.8; Platelets 355 06/24/2024: BUN 8; Creatinine, Ser 0.95; Potassium 4.5; Sodium 144  Recent Lipid Panel    Component Value Date/Time   CHOL 128 12/14/2017 0516   CHOL 143 12/27/2016 1040   TRIG 196 (H) 12/14/2017 0516   HDL 35 (L) 12/14/2017 0516   HDL 53 12/27/2016 1040   CHOLHDL 3.7 12/14/2017 0516   VLDL 39 12/14/2017 0516   LDLCALC 54 12/14/2017 0516   LDLCALC 70 12/27/2016 1040     Risk Assessment/Calculations:   {Does this patient have ATRIAL FIBRILLATION?:[phone]}  No BP recorded.  {Refresh Note OR Click here to enter BP  :1}***         Physical Exam:    VS:  There were no vitals taken for this visit.     Wt Readings from Last 3 Encounters:  06/24/24 (!) 378 lb (171.5 kg)  02/29/24 (!) 360 lb 3.7 oz (163.4 kg)  10/29/23 (!) 360 lb 4.8 oz (163.4 kg)     GEN: *** Well nourished, well developed in no acute distress HEENT: Normal NECK: No JVD; No carotid bruits LYMPHATICS: No lymphadenopathy CARDIAC: ***RRR, no murmurs, rubs, gallops RESPIRATORY:  Clear to auscultation without rales, wheezing or rhonchi  ABDOMEN: Soft, non-tender, non-distended MUSCULOSKELETAL:  No edema; No deformity  SKIN: Warm and dry NEUROLOGIC:  Alert and oriented x 3 PSYCHIATRIC:  Normal affect   ASSESSMENT:    No diagnosis found. PLAN:    In order of problems listed above:  ***      {Are you ordering a CV Procedure (e.g. stress test, cath, DCCV, TEE, etc)?   Press F2        :[id number]}    Medication Adjustments/Labs and Tests Ordered: Current medicines are reviewed at length with the patient today.  Concerns regarding medicines are outlined above.  No orders of the defined types were placed in this encounter.  No orders of the defined types were placed in this encounter.   There are no Patient Instructions on file for this visit.   Signed, Stacie Templin, MD  07/16/2024 8:51 AM    [facility]     [1]  No outpatient medications have been marked as taking for the 07/17/24 encounter (Appointment) with Currie Dennin M, MD.   "

## 2024-07-17 ENCOUNTER — Ambulatory Visit: Admitting: Cardiology

## 2024-07-24 ENCOUNTER — Ambulatory Visit: Attending: Internal Medicine | Admitting: Internal Medicine

## 2024-07-24 ENCOUNTER — Encounter: Payer: Self-pay | Admitting: Internal Medicine

## 2024-07-24 VITALS — BP 124/66 | HR 81 | Ht 69.0 in | Wt 368.0 lb

## 2024-07-24 DIAGNOSIS — G4733 Obstructive sleep apnea (adult) (pediatric): Secondary | ICD-10-CM | POA: Diagnosis not present

## 2024-07-24 DIAGNOSIS — Z720 Tobacco use: Secondary | ICD-10-CM

## 2024-07-24 DIAGNOSIS — E119 Type 2 diabetes mellitus without complications: Secondary | ICD-10-CM

## 2024-07-24 DIAGNOSIS — Z6841 Body Mass Index (BMI) 40.0 and over, adult: Secondary | ICD-10-CM

## 2024-07-24 DIAGNOSIS — I509 Heart failure, unspecified: Secondary | ICD-10-CM | POA: Diagnosis not present

## 2024-07-24 DIAGNOSIS — R079 Chest pain, unspecified: Secondary | ICD-10-CM | POA: Diagnosis not present

## 2024-07-24 MED ORDER — ASPIRIN 81 MG PO TBEC: 81.0000 mg | DELAYED_RELEASE_TABLET | Freq: Every day | ORAL | Status: AC

## 2024-07-24 MED ORDER — PREDNISONE 50 MG PO TABS: ORAL_TABLET | ORAL | 0 refills | Status: DC

## 2024-07-24 NOTE — Patient Instructions (Addendum)
 Medication Instructions:   START TAKING :  ASPIRIN   81 MG ONCE  A DAY     TAKE PREDNISONE   50 MG  : AS PRESCRIBED  PER ALLERGIES FOR DYE  :  Prednisone  50 mg - take 13 hours prior to test  Take another Prednisone  50 mg 7 hours prior to test     Take another Prednisone  50 mg 1 hour prior to test     Take Benadryl  50 mg 1 hour prior to test  MAKE SURE  TO  TAKE  DAY OF PROCEDURE :  YOUR METOPROLOL   100  MG  ONCE  2 HOURS  PRIOR  TO SCHEDULED     *If you need a refill on your cardiac medications before your next appointment, please call your pharmacy*    Lab Work: NONE ORDERED  TODAY    If you have labs (blood work) drawn today and your tests are completely normal, you will receive your results only by: MyChart Message (if you have MyChart) OR A paper copy in the mail If you have any lab test that is abnormal or we need to change your treatment, we will call you to review the results.   Testing/Procedures: Your physician has requested that you have an echocardiogram. Echocardiography is a painless test that uses sound waves to create images of your heart. It provides your doctor with information about the size and shape of your heart and how well your hearts chambers and valves are working. This procedure takes approximately one hour. There are no restrictions for this procedure. Please do NOT wear cologne, perfume, aftershave, or lotions (deodorant is allowed). Please arrive 15 minutes prior to your appointment time.  Please note: We ask at that you not bring children with you during ultrasound (echo/ vascular) testing. Due to room size and safety concerns, children are not allowed in the ultrasound rooms during exams. Our front office staff cannot provide observation of children in our lobby area while testing is being conducted. An adult accompanying a patient to their appointment will only be allowed in the ultrasound room at the discretion of the ultrasound technician under special  circumstances. We apologize for any inconvenience.   Non-Cardiac CT Angiography (CTA), is a special type of CT scan that uses a computer to produce multi-dimensional views of major blood vessels throughout the body. In CT angiography, a contrast material is injected through an IV to help visualize the blood vessels   Follow-Up: At [facility], you and your health needs are our priority.  As part of our continuing mission to provide you with exceptional heart care, our providers are all part of one team.  This team includes your primary Cardiologist (physician) and Advanced Practice Providers or APPs (Physician Assistants and Nurse Practitioners) who all work together to provide you with the care you need, when you need it.  Your next appointment:    1 month(s)   Provider:  Dr  Kriste    We recommend signing up for the patient portal called MyChart.  Sign up information is provided on this After Visit Summary.  MyChart is used to connect with patients for Virtual Visits (Telemedicine).  Patients are able to view lab/test results, encounter notes, upcoming appointments, etc.  Non-urgent messages can be sent to your provider as well.   To learn more about what you can do with MyChart, go to [email or web address].   Other Instructions   Your cardiac CT will be scheduled at one of the below  locations:   [facility] [address] [place], [place] [address] [phone] (Severe contrast allergies only)  OR   [facility] [address] [place], [place] [address] [phone]  OR   [facility] [address] [place], [place] [address] [phone]  OR   [facility] [address]  [place], [place] [address]  OR   [facility] [address] [place], [place] [phone]  If scheduled at [facility], please arrive at the [facility] and Children's Entrance (Entrance C2)  of [facility] 30 minutes prior to test start time. You can use the FREE valet parking offered at entrance C (encouraged to control the heart rate for the test)  Proceed to the [facility] Radiology Department (first floor) to check-in and test prep.  All radiology patients and guests should use entrance C2 at [facility], accessed from [facility], even though the hospital's physical address listed is [address].  If scheduled at the Heart and Vascular Tower at [address], please enter the parking lot using the [address] entrance and use the FREE valet service at the patient drop-off area. Enter the building and check-in with registration on the main floor.  If scheduled at [facility], please arrive to the Heart and Vascular Center 15 mins early for check-in and test prep.  There is spacious parking and easy access to the radiology department from the [facility] Heart and Vascular entrance. Please enter here and check-in with the desk attendant.   If scheduled at [facility], please arrive 30 minutes early for check-in and test prep.  Please follow these instructions carefully (unless otherwise directed):  An IV will be required for this test and Nitroglycerin will be given.  Hold all erectile dysfunction medications at least 3 days (72 hrs) prior to test. (Ie viagra, cialis, sildenafil, tadalafil, etc)   On the Night Before the Test: Be sure to Drink plenty of water. Do not consume any caffeinated/decaffeinated beverages or chocolate 12 hours prior to your test. Do not take any antihistamines 12 hours prior to your test.  If the patient has contrast allergy: Patient will need a prescription for Prednisone  and very clear instructions (as follows): Prednisone  50 mg - take 13 hours prior to test Take another Prednisone  50 mg 7 hours prior to test Take another Prednisone  50 mg 1 hour prior to test Take Benadryl  50  mg 1 hour prior to test Patient must complete all four doses of above prophylactic medications. Patient will need a ride after test due to Benadryl .  On the Day of the Test: Drink plenty of water until 1 hour prior to the test. Do not eat any food 1 hour prior to test. You may take your regular medications prior to the test.  Take metoprolol  (Lopressor ) two hours prior to test. If you take Furosemide /Hydrochlorothiazide/Spironolactone/Chlorthalidone, please HOLD on the morning of the test. Patients who wear a continuous glucose monitor MUST remove the device prior to scanning. FEMALES- please wear underwire-free bra if available, avoid dresses & tight clothing       After the Test: Drink plenty of water. After receiving IV contrast, you may experience a mild flushed feeling. This is normal. On occasion, you may experience a mild rash up to 24 hours after the test. This is not dangerous. If this occurs, you can take Benadryl  25 mg, Zyrtec, Claritin, or  Allegra and increase your fluid intake. (Patients taking Tikosyn should avoid Benadryl , and may take Zyrtec, Claritin, or Allegra) If you experience trouble breathing, this can be serious. If it is severe call 911 IMMEDIATELY. If it is mild, please call our office.  We will call to schedule your test 2-4 weeks out understanding that some insurance companies will need an authorization prior to the service being performed.   For more information and frequently asked questions, please visit our website : [email or web address]  For non-scheduling related questions, please contact the cardiac imaging nurse navigator should you have any questions/concerns: Cardiac Imaging Nurse Navigators Direct Office Dial: [phone]   For scheduling needs, including cancellations and rescheduling, please call Brittany, [phone].  For billing questions, please call [phone].

## 2024-07-24 NOTE — Progress Notes (Signed)
 " Cardiology Office Note:  .   Date:  07/24/2024  ID:  Brianna Roberts, DOB [birth year], MRN [id number] PCP: Suzie Rosaline PEDLAR, NP  [facility] Providers Cardiologist:  None    History of Present Illness: .     Discussed the use of AI scribe software for clinical note transcription with the patient, who gave verbal consent to proceed.  History of Present Illness Brianna Roberts is a 59 year old female with diastolic heart failure, moderate persistent asthma, and COPD who presents with chronic shortness of breath and chest pain.  Dyspnea and wheezing - Chronic severe shortness of breath, worsened with exertion - Associated with wheezing, perceived as originating from the throat - Asthma inhaler and DuoNet provide relief of chest wheezing - History of moderate persistent asthma and COPD - Prior evaluation for chronic shortness of breath considered multifactorial - History of living in a mold-infested environment, believed to contribute to respiratory symptoms - Pulmonology increased Lasix  dosage after chest x-ray in November showed cardiomegaly and pulmonary edema  Chest pain - Sharp, stabbing chest pain occurring at rest and with activity - On Monday, chest pain radiated to arm and jaw, accompanied by exhaustion and sluggishness - Did not seek emergency care due to logistical and financial concerns  Edema and fluid retention - Complains of severe pitting edema  - Significant weight fluctuations, recent weights ranging from 379 to 382 pounds - Difficulty emptying bladder, worsened after increased Lasix  dosage due to body habitus/pressure from pannus - complaints of asymmetrical abdominal mass, increasing in size and causing discomfort.  CT abdomen pelvis did not show any abdominal mass per se  Cardiac history and symptoms - Diastolic heart failure diagnosis - History of cardiomegaly and pulmonary edema on chest x-ray - Current medications include amlodipine  10 mg, Lasix   (specific regimen), and metoprolol  tartrate 100 mg twice daily - Echocardiogram ordered but not completed - Family history significant for congestive heart failure (mother, brother) and cardiac arrest (sister, another family member) - Expresses concern about health and safety, particularly regarding heart condition and risk of sudden cardiac events - Informed of congestive heart failure diagnosis by [facility]  Obstructive sleep apnea - Obstructive sleep apnea managed with CPAP  Diabetes mellitus - Type 2 diabetes mellitus - Discovered diabetes diagnosis through pharmacist  Nicotine  use - Nicotine  dependence - Continues to smoke, but not frequently   Chronic pain and swelling - History of fibromyalgia and scoliosis contributing to chronic pain - Current fatigue and shortness of breath are distinct from usual pain - History of angioedema and swelling in various body parts, suspected to be related to current symptoms - Swelling experienced after flights  Abdominal mass - Asymmetrical abdominal mass, approximately 20 pounds, increasing in size and causing discomfort and concern  Immunologic and infectious disease history - Reports that she is a participant in [facility] study for HTLV-1          ROS: Remaining review of systems negative  Studies Reviewed: SABRA   EKG Interpretation Date/Time:  Friday July 24 2024 10:18:07 EST Ventricular Rate:  81 PR Interval:  148 QRS Duration:  94 QT Interval:  398 QTC Calculation: 462 R Axis:   11  Text Interpretation: Normal sinus rhythm Normal ECG When compared with ECG of 04-May-2024 15:41, No significant change was found Confirmed by Kriste Hicks [phone]) on 07/24/2024 10:25:34 AM    Results Labs Lipid panel (05/22/2024): Total cholesterol 138, HDL 42, LDL 71, triglycerides 41  Radiology Chest  X-ray (05/04/2024): Cardiomegaly and pulmonary edema; no radiographic evidence of COPD  Diagnostic Echocardiogram (12/14/2017): Left  ventricular ejection fraction 60-65%, no regional wall motion abnormalities, grade 1 diastolic dysfunction Risk Assessment/Calculations:             Physical Exam:   VS:  BP 124/66   Pulse 81   Ht 5' 9 (1.753 m)   Wt (!) 368 lb (166.9 kg)   SpO2 95%   BMI 54.34 kg/m    Wt Readings from Last 3 Encounters:  07/24/24 (!) 368 lb (166.9 kg)  06/24/24 (!) 378 lb (171.5 kg)  02/29/24 (!) 360 lb 3.7 oz (163.4 kg)    GEN: Well nourished, well developed in no acute distress, morbidly obese NECK: No JVD; No carotid bruits CARDIAC:  RRR, no murmurs, no rubs, no gallops RESPIRATORY:  Clear to auscultation without rales, wheezing or rhonchi  ABDOMEN: Soft, non-tender, non-distended EXTREMITIES: 1+ bilateral lower extremity pitting edema; No deformity   ASSESSMENT AND PLAN: .    Assessment and Plan Assessment & Plan Chronic diastolic heart failure Diastolic dysfunction exacerbated by hypertension, obesity, and sleep apnea. Symptoms include exertional dyspnea, chest pain, and peripheral edema. Current management includes Lasix , amlodipine , and metoprolol .  SGLT2 contraindicated due to UTIs. Spironolactone contraindicated due to allergy. Weight loss and control of risk factors are crucial for improving diastolic function. - Ordered echocardiogram to assess current heart function. - Ordered CCTA, premedicated with steroids and Benadryl  due to contrast allergy. - Continue Lasix  as prescribed by pulmonology (80 mg in a.m. and 40 mg in p.m. Monday Wednesday Friday and 40 mg twice daily Tuesday Thursday Saturday Sunday). - Patient would benefit from starting on either a GLP-1 agonist or GIP/GLP-1 agonist as shown by evidence in the STEP-HFpEF trial and SUMMIT trial. Advised the patient to discuss these medications with her PCP - Encouraged smoking cessation and weight loss.  Atypical chest pain Patient complaining of what sounds like 2 different types of chest pain.  Has a right sided chest pain at  rest which sounds more musculoskeletal in nature.  Also experienced a one-time episode of left-sided chest discomfort with radiation to the jaw and shoulder which is concerning for cardiac etiology. - CCTA with contrast allergy premedication.  She will take her metoprolol  tartrate 100 mg 2 hours prior to the study (did not take this morning) - Start aspirin  81 mg - Advised to contact 911 if her chest pain recurs  Dyspnea/dyspnea on exertion, likely multifactorial: Diastolic heart failure, deconditioning, obesity, possible OHS, possibly CAD, possible asthma, exposure to black mold at home Being worked up for underlying asthma and has PFTs scheduled.  Follows with pulmonology - Given throat pain and shortness of breath would also consider vocal cord spasm and ENT referral if PFTs are unremarkable.  Will defer to pulmonology - CCTA with contrast allergy premedication as above  Morbid obesity Contributing to diastolic heart failure and other comorbidities. Weight fluctuations noted, possibly due to fluid retention. Weight loss is essential for improving heart function and overall health. - Discuss weight loss medications with PCP, such as Zepbound, Mounjaro, or Ozempic . - Encouraged lifestyle modifications for weight loss.  Type 2 diabetes - Advised discussion with PCP regarding meds as above  Obstructive sleep apnea Managed with CPAP. Last sleep study in 2020. CPAP machine in use but not upgraded. Sleep apnea contributes to diastolic heart failure. - Scheduled for a new sleep study to reassess CPAP settings.  Nicotine  dependence Contributing to cardiovascular and respiratory issues. Smoking  cessation is crucial for improving overall health and managing heart failure. - Encouraged smoking cessation.  HTLV 1 Reports that she follows with the [employer]            Follow up: 1 month  Signed, Emeline FORBES Calender, DO  07/24/2024 11:38 AM    [facility] "

## 2024-07-26 ENCOUNTER — Emergency Department (HOSPITAL_COMMUNITY)

## 2024-07-26 ENCOUNTER — Other Ambulatory Visit: Payer: Self-pay

## 2024-07-26 ENCOUNTER — Observation Stay (HOSPITAL_COMMUNITY)
Admission: EM | Admit: 2024-07-26 | Discharge: 2024-07-27 | Disposition: A | Attending: Emergency Medicine | Admitting: Emergency Medicine

## 2024-07-26 ENCOUNTER — Encounter (HOSPITAL_COMMUNITY): Payer: Self-pay | Admitting: Internal Medicine

## 2024-07-26 DIAGNOSIS — I10 Essential (primary) hypertension: Secondary | ICD-10-CM | POA: Diagnosis present

## 2024-07-26 DIAGNOSIS — Z79899 Other long term (current) drug therapy: Secondary | ICD-10-CM | POA: Insufficient documentation

## 2024-07-26 DIAGNOSIS — I5032 Chronic diastolic (congestive) heart failure: Secondary | ICD-10-CM | POA: Insufficient documentation

## 2024-07-26 DIAGNOSIS — F419 Anxiety disorder, unspecified: Secondary | ICD-10-CM | POA: Diagnosis present

## 2024-07-26 DIAGNOSIS — R5381 Other malaise: Secondary | ICD-10-CM | POA: Diagnosis present

## 2024-07-26 DIAGNOSIS — E039 Hypothyroidism, unspecified: Secondary | ICD-10-CM | POA: Diagnosis present

## 2024-07-26 DIAGNOSIS — E119 Type 2 diabetes mellitus without complications: Secondary | ICD-10-CM | POA: Insufficient documentation

## 2024-07-26 DIAGNOSIS — F32A Depression, unspecified: Secondary | ICD-10-CM | POA: Diagnosis present

## 2024-07-26 DIAGNOSIS — I110 Hypertensive heart disease with heart failure: Secondary | ICD-10-CM | POA: Insufficient documentation

## 2024-07-26 DIAGNOSIS — R079 Chest pain, unspecified: Principal | ICD-10-CM

## 2024-07-26 DIAGNOSIS — K589 Irritable bowel syndrome without diarrhea: Secondary | ICD-10-CM | POA: Diagnosis present

## 2024-07-26 DIAGNOSIS — G4733 Obstructive sleep apnea (adult) (pediatric): Secondary | ICD-10-CM | POA: Diagnosis present

## 2024-07-26 DIAGNOSIS — J441 Chronic obstructive pulmonary disease with (acute) exacerbation: Principal | ICD-10-CM | POA: Diagnosis present

## 2024-07-26 DIAGNOSIS — E876 Hypokalemia: Secondary | ICD-10-CM | POA: Insufficient documentation

## 2024-07-26 DIAGNOSIS — Z794 Long term (current) use of insulin: Secondary | ICD-10-CM | POA: Insufficient documentation

## 2024-07-26 DIAGNOSIS — A6009 Herpesviral infection of other urogenital tract: Secondary | ICD-10-CM | POA: Insufficient documentation

## 2024-07-26 DIAGNOSIS — F418 Other specified anxiety disorders: Secondary | ICD-10-CM | POA: Insufficient documentation

## 2024-07-26 DIAGNOSIS — F17210 Nicotine dependence, cigarettes, uncomplicated: Secondary | ICD-10-CM | POA: Insufficient documentation

## 2024-07-26 DIAGNOSIS — M797 Fibromyalgia: Secondary | ICD-10-CM | POA: Insufficient documentation

## 2024-07-26 DIAGNOSIS — B9733 Human T-cell lymphotrophic virus, type I [HTLV-I] as the cause of diseases classified elsewhere: Secondary | ICD-10-CM | POA: Insufficient documentation

## 2024-07-26 DIAGNOSIS — R300 Dysuria: Secondary | ICD-10-CM | POA: Insufficient documentation

## 2024-07-26 LAB — URINALYSIS, W/ REFLEX TO CULTURE (INFECTION SUSPECTED)
Bilirubin Urine: NEGATIVE
Glucose, UA: NEGATIVE mg/dL
Hgb urine dipstick: NEGATIVE
Ketones, ur: NEGATIVE mg/dL
Leukocytes,Ua: NEGATIVE
Nitrite: NEGATIVE
Protein, ur: 100 mg/dL — AB
Specific Gravity, Urine: 1.02 (ref 1.005–1.030)
pH: 5 (ref 5.0–8.0)

## 2024-07-26 LAB — CBC WITH DIFFERENTIAL/PLATELET
Abs Immature Granulocytes: 0.03 10*3/uL (ref 0.00–0.07)
Basophils Absolute: 0.1 10*3/uL (ref 0.0–0.1)
Basophils Relative: 1 %
Eosinophils Absolute: 0.2 10*3/uL (ref 0.0–0.5)
Eosinophils Relative: 3 %
HCT: 37.7 % (ref 36.0–46.0)
Hemoglobin: 12.3 g/dL (ref 12.0–15.0)
Immature Granulocytes: 0 %
Lymphocytes Relative: 40 %
Lymphs Abs: 3.5 10*3/uL (ref 0.7–4.0)
MCH: 27.9 pg (ref 26.0–34.0)
MCHC: 32.6 g/dL (ref 30.0–36.0)
MCV: 85.5 fL (ref 80.0–100.0)
Monocytes Absolute: 0.6 10*3/uL (ref 0.1–1.0)
Monocytes Relative: 7 %
Neutro Abs: 4.4 10*3/uL (ref 1.7–7.7)
Neutrophils Relative %: 49 %
Platelets: 307 10*3/uL (ref 150–400)
RBC: 4.41 MIL/uL (ref 3.87–5.11)
RDW: 13.5 % (ref 11.5–15.5)
WBC: 8.8 10*3/uL (ref 4.0–10.5)
nRBC: 0 % (ref 0.0–0.2)

## 2024-07-26 LAB — RESPIRATORY PANEL BY PCR

## 2024-07-26 LAB — COMPREHENSIVE METABOLIC PANEL WITH GFR
ALT: 22 U/L (ref 0–44)
AST: 20 U/L (ref 15–41)
Albumin: 3.9 g/dL (ref 3.5–5.0)
Alkaline Phosphatase: 67 U/L (ref 38–126)
Anion gap: 11 (ref 5–15)
BUN: 7 mg/dL (ref 6–20)
CO2: 25 mmol/L (ref 22–32)
Calcium: 8.9 mg/dL (ref 8.9–10.3)
Chloride: 106 mmol/L (ref 98–111)
Creatinine, Ser: 0.91 mg/dL (ref 0.44–1.00)
GFR, Estimated: >60 mL/min
Glucose, Bld: 155 mg/dL — ABNORMAL HIGH (ref 70–99)
Potassium: 3.3 mmol/L — ABNORMAL LOW (ref 3.5–5.1)
Sodium: 142 mmol/L (ref 135–145)
Total Bilirubin: 0.9 mg/dL (ref 0.0–1.2)
Total Protein: 6.5 g/dL (ref 6.5–8.1)

## 2024-07-26 LAB — RESP PANEL BY RT-PCR (RSV, FLU A&B, COVID)  RVPGX2
Influenza A by PCR: NEGATIVE
Influenza B by PCR: NEGATIVE
Resp Syncytial Virus by PCR: NEGATIVE
SARS Coronavirus 2 by RT PCR: NEGATIVE

## 2024-07-26 LAB — TROPONIN T, HIGH SENSITIVITY
Troponin T High Sensitivity: 6 ng/L (ref 0–19)
Troponin T High Sensitivity: 7 ng/L (ref 0–19)

## 2024-07-26 LAB — PRO BRAIN NATRIURETIC PEPTIDE: Pro Brain Natriuretic Peptide: <50 pg/mL

## 2024-07-26 LAB — GLUCOSE, CAPILLARY
Glucose-Capillary: 231 mg/dL — ABNORMAL HIGH (ref 70–99)
Glucose-Capillary: 232 mg/dL — ABNORMAL HIGH (ref 70–99)
Glucose-Capillary: 277 mg/dL — ABNORMAL HIGH (ref 70–99)

## 2024-07-26 MED ORDER — FUROSEMIDE 40 MG PO TABS: 40.0000 mg | ORAL_TABLET | ORAL | Status: DC

## 2024-07-26 MED ORDER — DICLOFENAC SODIUM 1 % EX GEL
4.0000 g | Freq: Four times a day (QID) | CUTANEOUS | Status: DC
  Administered 2024-07-26 (×3): 4 g via TOPICAL
  Filled 2024-07-26: qty 100

## 2024-07-26 MED ORDER — IPRATROPIUM-ALBUTEROL 0.5-2.5 (3) MG/3ML IN SOLN
3.0000 mL | Freq: Four times a day (QID) | RESPIRATORY_TRACT | Status: DC
  Administered 2024-07-26 – 2024-07-27 (×3): 3 mL via RESPIRATORY_TRACT
  Filled 2024-07-26 (×2): qty 3

## 2024-07-26 MED ORDER — DOCUSATE SODIUM 100 MG PO CAPS
100.0000 mg | ORAL_CAPSULE | Freq: Two times a day (BID) | ORAL | Status: DC
  Filled 2024-07-26 (×3): qty 1

## 2024-07-26 MED ORDER — MORPHINE SULFATE (PF) 4 MG/ML IV SOLN
4.0000 mg | Freq: Once | INTRAVENOUS | Status: AC
  Administered 2024-07-26: 4 mg via INTRAVENOUS
  Filled 2024-07-26: qty 1

## 2024-07-26 MED ORDER — ACETAMINOPHEN 500 MG PO TABS
1000.0000 mg | ORAL_TABLET | Freq: Four times a day (QID) | ORAL | Status: DC
  Administered 2024-07-26 (×3): 1000 mg via ORAL
  Filled 2024-07-26 (×3): qty 2

## 2024-07-26 MED ORDER — FLUTICASONE FUROATE-VILANTEROL 100-25 MCG/ACT IN AEPB
1.0000 | INHALATION_SPRAY | Freq: Every day | RESPIRATORY_TRACT | Status: DC
  Administered 2024-07-26: 1 via RESPIRATORY_TRACT
  Filled 2024-07-26: qty 28

## 2024-07-26 MED ORDER — LEVOTHYROXINE SODIUM 50 MCG PO TABS
50.0000 ug | ORAL_TABLET | Freq: Every day | ORAL | Status: DC
  Administered 2024-07-27: 50 ug via ORAL
  Filled 2024-07-26 (×2): qty 1

## 2024-07-26 MED ORDER — PREDNISONE 20 MG PO TABS: 40.0000 mg | ORAL_TABLET | Freq: Every day | ORAL | 0 refills | Status: DC

## 2024-07-26 MED ORDER — PREDNISONE 20 MG PO TABS
40.0000 mg | ORAL_TABLET | Freq: Every day | ORAL | Status: DC
  Administered 2024-07-27: 40 mg via ORAL
  Filled 2024-07-26: qty 2

## 2024-07-26 MED ORDER — POTASSIUM CHLORIDE CRYS ER 10 MEQ PO TBCR
10.0000 meq | EXTENDED_RELEASE_TABLET | Freq: Two times a day (BID) | ORAL | Status: DC
  Administered 2024-07-26 – 2024-07-27 (×3): 10 meq via ORAL
  Filled 2024-07-26 (×3): qty 1

## 2024-07-26 MED ORDER — ASPIRIN 81 MG PO TBEC
81.0000 mg | DELAYED_RELEASE_TABLET | Freq: Every day | ORAL | Status: DC
  Administered 2024-07-26 – 2024-07-27 (×2): 81 mg via ORAL
  Filled 2024-07-26 (×2): qty 1

## 2024-07-26 MED ORDER — ENOXAPARIN SODIUM 80 MG/0.8ML IJ SOSY
80.0000 mg | PREFILLED_SYRINGE | INTRAMUSCULAR | Status: DC
  Administered 2024-07-26: 80 mg via SUBCUTANEOUS
  Filled 2024-07-26 (×2): qty 0.8

## 2024-07-26 MED ORDER — KETOROLAC TROMETHAMINE 15 MG/ML IJ SOLN
15.0000 mg | Freq: Once | INTRAMUSCULAR | Status: AC
  Administered 2024-07-26: 15 mg via INTRAVENOUS
  Filled 2024-07-26: qty 1

## 2024-07-26 MED ORDER — POLYETHYLENE GLYCOL 3350 17 G PO PACK: 17.0000 g | PACK | Freq: Every day | ORAL | Status: DC | PRN

## 2024-07-26 MED ORDER — ACETAMINOPHEN 325 MG PO TABS: 650.0000 mg | ORAL_TABLET | Freq: Once | ORAL | Status: DC

## 2024-07-26 MED ORDER — LIDOCAINE 5 % EX PTCH
1.0000 | MEDICATED_PATCH | CUTANEOUS | Status: DC
  Administered 2024-07-26 – 2024-07-27 (×2): 1 via TRANSDERMAL
  Filled 2024-07-26 (×2): qty 1

## 2024-07-26 MED ORDER — IPRATROPIUM-ALBUTEROL 0.5-2.5 (3) MG/3ML IN SOLN
3.0000 mL | Freq: Once | RESPIRATORY_TRACT | Status: AC
  Administered 2024-07-26: 3 mL via RESPIRATORY_TRACT
  Filled 2024-07-26: qty 3

## 2024-07-26 MED ORDER — INSULIN ASPART 100 UNIT/ML IJ SOLN
0.0000 [IU] | Freq: Three times a day (TID) | INTRAMUSCULAR | Status: DC
  Administered 2024-07-26: 7 [IU] via SUBCUTANEOUS
  Administered 2024-07-27: 4 [IU] via SUBCUTANEOUS
  Administered 2024-07-27: 11 [IU] via SUBCUTANEOUS
  Filled 2024-07-26: qty 4
  Filled 2024-07-26: qty 7
  Filled 2024-07-26: qty 11

## 2024-07-26 MED ORDER — NICOTINE 7 MG/24HR TD PT24
7.0000 mg | MEDICATED_PATCH | Freq: Every day | TRANSDERMAL | Status: DC
  Administered 2024-07-26 – 2024-07-27 (×2): 7 mg via TRANSDERMAL
  Filled 2024-07-26 (×4): qty 1

## 2024-07-26 MED ORDER — AMLODIPINE BESYLATE 5 MG PO TABS
10.0000 mg | ORAL_TABLET | Freq: Every day | ORAL | Status: DC
  Administered 2024-07-26 – 2024-07-27 (×2): 10 mg via ORAL
  Filled 2024-07-26 (×2): qty 2

## 2024-07-26 MED ORDER — ALBUTEROL SULFATE (2.5 MG/3ML) 0.083% IN NEBU
2.5000 mg | INHALATION_SOLUTION | RESPIRATORY_TRACT | Status: DC | PRN
  Administered 2024-07-27 (×2): 2.5 mg via RESPIRATORY_TRACT
  Filled 2024-07-26 (×2): qty 3

## 2024-07-26 MED ORDER — FUROSEMIDE 40 MG PO TABS
80.0000 mg | ORAL_TABLET | ORAL | Status: DC
  Administered 2024-07-27: 80 mg via ORAL
  Filled 2024-07-26: qty 2

## 2024-07-26 MED ORDER — DICYCLOMINE HCL 20 MG PO TABS
20.0000 mg | ORAL_TABLET | Freq: Two times a day (BID) | ORAL | Status: DC
  Filled 2024-07-26 (×4): qty 1

## 2024-07-26 MED ORDER — VALACYCLOVIR HCL 500 MG PO TABS
1000.0000 mg | ORAL_TABLET | Freq: Every day | ORAL | Status: DC
  Administered 2024-07-26 – 2024-07-27 (×2): 1000 mg via ORAL
  Filled 2024-07-26 (×5): qty 2

## 2024-07-26 MED ORDER — FUROSEMIDE 40 MG PO TABS
40.0000 mg | ORAL_TABLET | ORAL | Status: DC
  Administered 2024-07-26: 40 mg via ORAL
  Filled 2024-07-26: qty 1

## 2024-07-26 MED ORDER — FUROSEMIDE 20 MG PO TABS: 20.0000 mg | ORAL_TABLET | ORAL | Status: DC

## 2024-07-26 MED ORDER — ACETAMINOPHEN 650 MG RE SUPP: 650.0000 mg | Freq: Four times a day (QID) | RECTAL | Status: DC

## 2024-07-26 MED ORDER — METHYLPREDNISOLONE SODIUM SUCC 125 MG IJ SOLR
125.0000 mg | Freq: Once | INTRAMUSCULAR | Status: AC
  Administered 2024-07-26: 125 mg via INTRAVENOUS
  Filled 2024-07-26: qty 2

## 2024-07-26 MED ORDER — HYDROXYZINE HCL 10 MG PO TABS
10.0000 mg | ORAL_TABLET | Freq: Three times a day (TID) | ORAL | Status: DC | PRN
  Administered 2024-07-26: 10 mg via ORAL
  Filled 2024-07-26: qty 1

## 2024-07-26 NOTE — Plan of Care (Signed)
   Problem: Activity: Goal: Risk for activity intolerance will decrease Outcome: Progressing   Problem: Nutrition: Goal: Adequate nutrition will be maintained Outcome: Progressing   Problem: Safety: Goal: Ability to remain free from injury will improve Outcome: Progressing

## 2024-07-26 NOTE — ED Notes (Signed)
 Pt became visibly SOB when attempting getting up the bed. Also c/o feeling light headed. Admitting MD made aware.SABRASABRASABRA

## 2024-07-26 NOTE — ED Provider Notes (Signed)
 " [facility] Provider Note   CSN: [id number] Arrival date & time: 07/26/24  9454     Patient presents with: Chest Pain and Shortness of Breath   Brianna Roberts is a 59 y.o. female.   Patient to ED for evaluation of left chest pain, radiating to jaw that woke her from sleep tonight. She states the pain waxes and wanes in intensity. She has SOB. No cough or fever. Not vomiting. She reports swelling in her legs, history of CHF, followed by [facility], last seen 07/24/24. Currently on Lasix , Metoprolol  and amlodipine . She was given 5 SL NTG by EMS and aspirin  but has ongoing pain.   The history is provided by the patient and the EMS personnel. No language interpreter was used.  Chest Pain Associated symptoms: shortness of breath   Shortness of Breath Associated symptoms: chest pain        Prior to Admission medications  Medication Sig Start Date End Date Taking? Authorizing Provider  albuterol  (VENTOLIN  HFA) 108 (90 Base) MCG/ACT inhaler Inhale 1-2 puffs into the lungs every 6 (six) hours as needed for wheezing or shortness of breath. 01/23/23   Raford Lenis, MD  amitriptyline  (ELAVIL ) 100 MG tablet Take 1 tablet (100 mg total) by mouth at bedtime. 08/02/23   Masters, Katie, DO  amLODipine  (NORVASC ) 10 MG tablet Take 1 tablet (10 mg total) by mouth daily. 03/26/23   Jolaine Pac, DO  aspirin  EC 81 MG tablet Take 1 tablet (81 mg total) by mouth daily. Swallow whole. 07/24/24   Segal, Jared E, DO  clindamycin (CLINDAGEL) 1 % gel Apply 1 Application topically 2 (two) times daily. 12/29/23   [provider]  diazepam  (VALIUM ) 2 MG tablet Take 2 mg by mouth 2 (two) times daily. 12/29/23   [provider]  diazepam  (VALIUM ) 5 MG tablet Take 1 tablet (5 mg total) by mouth every 8 (eight) hours as needed for anxiety. 09/03/23   Marylu Gee, DO  dicyclomine  (BENTYL ) 20 MG tablet Take 1 tablet (20 mg total) by mouth 2 (two) times daily.  05/04/24   Hildegard, Amjad, PA-C  fluticasone  (FLONASE ) 50 MCG/ACT nasal spray Place 1 spray into both nostrils daily. 03/26/23   Jolaine Pac, DO  Fluticasone -Umeclidin-Vilant (TRELEGY ELLIPTA ) 200-62.5-25 MCG/ACT AEPB Inhale 1 Inhalation into the lungs daily at 6 (six) AM. 08/30/23   Marylu Gee, DO  furosemide  (LASIX ) 40 MG tablet Please take 2 tablets in the morning and 1 tablet in the evening Mondays, Wednesdays, and Fridays and take 1 tablet in the morning and 1 tablet in the evening on Tuesdays, Thursdays, Saturdays, and Sundays. 06/24/24   Alghanim, Paula, MD  hydrOXYzine  (ATARAX ) 10 MG tablet Take 10 mg by mouth 2 (two) times daily. 12/28/23   [provider]  levothyroxine  (SYNTHROID ) 50 MCG tablet Take 1 tablet (50 mcg total) by mouth daily before breakfast. 03/06/23   Tobie Gaines, DO  meclizine  (ANTIVERT ) 25 MG tablet Take 1 tablet (25 mg total) by mouth 3 (three) times daily as needed for dizziness. 08/04/23   Ruthell Lonni FALCON, PA-C  meloxicam  (MOBIC ) 7.5 MG tablet TAKE 2 TABLETS BY MOUTH IN THE MORNING AND AT BEDTIME 11/25/23   Marylu Gee, DO  metoprolol  tartrate (LOPRESSOR ) 100 MG tablet Take 1 tablet (100 mg total) by mouth 2 (two) times daily. 03/26/23   Jolaine Pac, DO  nystatin  cream (MYCOSTATIN ) Apply 1 Application topically 2 (two) times daily. 03/26/23   Jolaine Pac, DO  ondansetron  (ZOFRAN -ODT) 8 MG disintegrating tablet Take 1 tablet (8 mg total) by mouth every 8 (eight) hours as needed for nausea or vomiting. 01/25/23   Tobie Gaines, DO  oxyCODONE -acetaminophen  (PERCOCET) 10-325 MG tablet Take 1 tablet by mouth daily as needed. 12/29/23   [provider]  potassium chloride  (KLOR-CON  M) 10 MEQ tablet Take 1 tablet (10 mEq total) by mouth 2 (two) times daily. 09/02/23   Marylu Gee, DO  predniSONE  (DELTASONE ) 50 MG tablet 1. Prednisone  50 mg - take 13 hours prior to test 2. Take another Prednisone  50 mg 7 hours prior to test 3. Take another Prednisone  50 mg 1  hour prior to test 4. Take Benadryl  50 mg 1 hour prior to test 07/24/24   Segal, Jared E, DO  triamcinolone  (KENALOG ) 0.025 % ointment Apply 1 Application topically 2 (two) times daily. 12/10/23   Burnette, Jennifer M, PA-C  Triamcinolone  Acetonide (TRIAMCINOLONE  0.1 % CREAM : EUCERIN) CREA Apply 1 Application topically daily as needed. 09/03/23   Marylu Gee, DO  valACYclovir  (VALTREX ) 1000 MG tablet Take 1 tablet (1,000 mg total) by mouth daily. 03/26/23   Jolaine Pac, DO    Allergies: Hydromorphone, Iodinated contrast media, Sulfa antibiotics, Codeine, Nickel, Penicillins, Spironolactone, Cefdinir, and Adhesive [tape]    Review of Systems  Respiratory:  Positive for shortness of breath.   Cardiovascular:  Positive for chest pain.    Updated Vital Signs BP 107/61   Pulse 98   Temp 98.1 F (36.7 C)   Resp 16   Ht 5' 9 (1.753 m)   Wt (!) 173.3 kg   SpO2 95%   BMI 56.41 kg/m   Physical Exam Vitals and nursing note reviewed.  Constitutional:      Appearance: She is obese. She is not ill-appearing or diaphoretic.     Comments: Uncomfortable appearing.  HENT:     Head: Normocephalic.  Cardiovascular:     Rate and Rhythm: Normal rate and regular rhythm.     Heart sounds: No murmur heard. Pulmonary:     Effort: Pulmonary effort is normal.     Breath sounds: No wheezing or rhonchi.  Chest:     Chest wall: No tenderness.  Abdominal:     General: There is no distension.     Palpations: Abdomen is soft.     Tenderness: There is no abdominal tenderness.  Musculoskeletal:     Cervical back: Normal range of motion.     Right lower leg: Edema present.     Left lower leg: Edema present.  Skin:    General: Skin is warm and dry.  Neurological:     Mental Status: She is alert and oriented to person, place, and time.     (all labs ordered are listed, but only abnormal results are displayed) Labs Reviewed  CBC WITH DIFFERENTIAL/PLATELET  COMPREHENSIVE METABOLIC PANEL WITH GFR   PRO BRAIN NATRIURETIC PEPTIDE  TROPONIN T, HIGH SENSITIVITY    EKG: None  Radiology: No results found.   Procedures   Medications Ordered in the ED  morphine  (PF) 4 MG/ML injection 4 mg (4 mg Intravenous Given 07/26/24 0606)    Clinical Course as of 07/26/24 0625  Sun Jul 26, 2024  0606 Patient to ED with chest pain, SOB. H/O CHF, COPD (continuous smoker). No fever. EKG reviewed and has no ischemic changes. Labs pending. Pain addressed. DDx: CHF exacerbation vs ACS vs COPD vs PNA, vs PTX vs dissection vs PE.  [SU]  9376 Patient care signed  out to oncoming provider to review labs, re-evaluate patient condition and for appropriate disposition.  [SU]    Clinical Course User Index [SU] Odell Balls, PA-C                                 Medical Decision Making Amount and/or Complexity of Data Reviewed Labs: ordered. Radiology: ordered.  Risk Prescription drug management.       Final diagnoses:  Chest pain, unspecified type    ED Discharge Orders     None          Odell Balls, PA-C 07/26/24 9374  "

## 2024-07-26 NOTE — Care Management (Signed)
 SDOH resources added to AVS

## 2024-07-26 NOTE — Evaluation (Signed)
 Physical Therapy Evaluation Patient Details Name: Brianna Roberts MRN: [id number] DOB: [birth year] Today's Date: 07/26/2024  History of Present Illness  The pt is a 59 yo female presenting 2/1 with SOB and sharp chest pain.  Admitted for COPD exacerbation and chest pain management. PMH includes: CHF, COPD, tobacco use, fibromyalgia, obesity (BMI 57), arthritis, anxiety, HTN, TIA, thyroid  disease, and HIV.  Clinical Impression  Pt in bed upon arrival of PT, agreeable to evaluation at this time. Prior to admission the pt was mobilizing in her home without DME, but reports furniture walking and using rails on stairs to manage in the home. Pt reports she was active with HHPT, HHOT, HH RN, and HH aide, but had not seen her aide since December and has therefore been unable to shower without them. The pt presents with deficits in LE strength, endurance, power, and dynamic stability, as well as HR elevation to 115bpm with sitting EOB with pt feeling exceedingly hot and sweaty. Pt also SOB with conversation, needing rest breaks to catch her breath despite SpO2 >91% on RA. Pt will continue to benefit from skilled PT acutely to progress functional endurance, activity tolerance, and balance as well as further evaluation of SpO2 with exertion. Recommend pt resume all HH services once medically stable to return home.     If plan is discharge home, recommend the following: A little help with walking and/or transfers;A lot of help with bathing/dressing/bathroom;Assist for transportation;Help with stairs or ramp for entrance;Supervision due to cognitive status   Can travel by private vehicle        Equipment Recommendations None recommended by PT  Recommendations for Other Services       Functional Status Assessment Patient has had a recent decline in their functional status and demonstrates the ability to make significant improvements in function in a reasonable and predictable amount of time.     Precautions  / Restrictions Precautions Precautions: Fall Recall of Precautions/Restrictions: Intact Precaution/Restrictions Comments: watch HR and SpO2, on RA Restrictions Weight Bearing Restrictions Per Provider Order: No      Mobility  Bed Mobility Overal bed mobility: Modified Independent             General bed mobility comments: increased time, use of rail    Transfers Overall transfer level: Needs assistance Equipment used: None Transfers: Sit to/from Stand Sit to Stand: Contact guard assist           General transfer comment: pt using BUE support on bed to rise and maintained single UE support on bed rail in standing. HR maintained at 115bpm and pt reports too sewaty and hot to progress mobility. BP elevated but stable    Ambulation/Gait               General Gait Details: pt limited to small lateral steps along EOB, reports hot and sweaty and deferrs further ambulation     Balance Overall balance assessment: Needs assistance Sitting-balance support: No upper extremity supported, Feet supported Sitting balance-Leahy Scale: Fair     Standing balance support: Single extremity supported, During functional activity Standing balance-Leahy Scale: Fair Standing balance comment: pt maintained UE support in standing, reports DME makes her fall at home but that she has not had any falls in past 6 months                             Pertinent Vitals/Pain Pain Assessment Pain Assessment: 0-10 Pain Score: 8  Pain Location: chest Pain Descriptors / Indicators: Sharp Pain Intervention(s): Limited activity within patient's tolerance, Monitored during session, Premedicated before session, Repositioned    Home Living Family/patient expects to be discharged to:: Private residence Living Arrangements: Alone Available Help at Discharge:  (reports no assist) Type of Home: House Home Access: Stairs to enter Entrance Stairs-Rails: None Entrance Stairs-Number of  Steps: 3 Alternate Level Stairs-Number of Steps: 13 Home Layout: Two level;Bed/bath upstairs Home Equipment: Pharmacist, Hospital (2 wheels);Rollator (4 wheels);Cane - quad;Hand held shower head Additional Comments: has HHaides who are supposed to come 1x/week to assist with bathing, pt cannot complete on her own    Prior Function Prior Level of Function : Independent/Modified Independent             Mobility Comments: furniture walks, reports DME makes me fall but also that she has not had any recent falls. reports no exercise or activity, active with HHPT to walk 10-15 ft ADLs Comments: significant endurance issues, sitting every 4-5 steps, reports unable to get in shower without assistance     Extremity/Trunk Assessment   Upper Extremity Assessment Upper Extremity Assessment: Generalized weakness;LUE deficits/detail (limited shoulder ROM and strength at baseline, reports hx of bilateral frozen shoulder but can move to 90deg.) LUE Deficits / Details: reports hx of bilateral frozen shoulder but can move to 90deg. chest pain reproduced with resisted shoulder abd and extension LUE: Shoulder pain with ROM    Lower Extremity Assessment Lower Extremity Assessment: Generalized weakness;RLE deficits/detail RLE Deficits / Details: reports abdominal distension causing hip pain which limits hip ROM and knee strength due to pain. unable to tolerate more than min resistance RLE: Unable to fully assess due to pain RLE Sensation: WNL RLE Coordination: WNL    Cervical / Trunk Assessment Cervical / Trunk Assessment: Other exceptions Cervical / Trunk Exceptions: increased body habitus  Communication   Communication Communication: No apparent difficulties    Cognition Arousal: Alert Behavior During Therapy: WFL for tasks assessed/performed   PT - Cognitive impairments: No family/caregiver present to determine baseline                       PT - Cognition Comments: pt  appears to be at her baseline, anxious about medical condition but able to answer questions. Following commands: Intact       Cueing Cueing Techniques: Verbal cues     General Comments General comments (skin integrity, edema, etc.): HR 110-115bpm at rest sitting EOB with pt SOB (SpO2 91-94% on RA), and BP elevated to 160s/60. Pt diaphoretic    Exercises     Assessment/Plan    PT Assessment Patient needs continued PT services  PT Problem List Decreased strength;Decreased range of motion;Decreased activity tolerance;Decreased balance;Decreased mobility;Cardiopulmonary status limiting activity       PT Treatment Interventions DME instruction;Gait training;Stair training;Functional mobility training;Therapeutic activities;Therapeutic exercise;Balance training;Patient/family education    PT Goals (Current goals can be found in the Care Plan section)  Acute Rehab PT Goals Patient Stated Goal: to improve breathing PT Goal Formulation: With patient Time For Goal Achievement: 08/09/24 Potential to Achieve Goals: Fair    Frequency Min 2X/week        AM-PAC PT 6 Clicks Mobility  Outcome Measure Help needed turning from your back to your side while in a flat bed without using bedrails?: None Help needed moving from lying on your back to sitting on the side of a flat bed without using bedrails?: None Help needed moving to  and from a bed to a chair (including a wheelchair)?: A Little Help needed standing up from a chair using your arms (e.g., wheelchair or bedside chair)?: A Little Help needed to walk in hospital room?: Total (<20 ft on eval) Help needed climbing 3-5 steps with a railing? : Total 6 Click Score: 16    End of Session   Activity Tolerance: Other (comment) (hot, diaphoretic, HR to 115bpm seated at EOB) Patient left: in bed;with call bell/phone within reach;with bed alarm set Nurse Communication: Mobility status (pt concern for fever, sweating) PT Visit Diagnosis:  Unsteadiness on feet (R26.81);Muscle weakness (generalized) (M62.81);Pain Pain - Right/Left: Right Pain - part of body: Hip    Time: 8446-8374 PT Time Calculation (min) (ACUTE ONLY): 32 min   Charges:   PT Evaluation $PT Eval Low Complexity: 1 Low PT Treatments $Therapeutic Activity: 8-22 mins PT General Charges $$ ACUTE PT VISIT: 1 Visit         Izetta Call, PT, DPT   Acute Rehabilitation Department Office [phone] Secure Chat Communication Preferred  Izetta JULIANNA Call 07/26/2024, 6:06 PM

## 2024-07-26 NOTE — Progress Notes (Signed)
 There was a consult for placing a PIV access. This patient doesn't have any IV meds. Informed patient's RN and MD regarding this matter. Dr. Rosan is okay no PIV access at this time. HS [employer]

## 2024-07-26 NOTE — Progress Notes (Signed)
 Patient arrived to room 3E23. VS stable, patient oriented to room.

## 2024-07-26 NOTE — ED Provider Notes (Addendum)
 Signout from [employer], PA-C at shift change. Briefly, patient presents for chest pain and shortness of breath. Got nitroglycerin via EMS with no relief. Just saw cardiology Friday with same complaints. 1+ pitting edema of legs at that time, no change today.    Plan: If trop x2 neg and bnp normal, dc with cardiology follow-up.  Patient has Several cardiology follow-up scheduled.   10:05 AM Reassessment performed. Patient appears resting comfortably in the bed.  She does have a small episode of sharp chest pain last approximately 15 seconds.  This does improve.  Will give Toradol  and breathing treatment and reevaluate.  On reevaluation, patient notes chest has opened up and feeling improved after breathing treatment.  Otherwise continued stable and well-appearing.  Will treat for COPD exacerbation.  On attempting to discharge patient, patient notes her chest pain became much worse and was having difficulty breathing.  Will admit for COPD exacerbation and chest pain workup.  Labs and imaging personally reviewed and interpreted including: Labs overall reassuring including 2 negative troponins and BNP   Reviewed additional pertinent lab work and imaging with patient at bedside including: Chest x-ray and labs today discussed with patient.  Reassurance provided.  Chest pain does not appear cardiac in nature.  Patient verbalizes understanding.   Most current vital signs reviewed and are as follows: BP (!) 151/78 (BP Location: Right Wrist)   Pulse 88   Temp 98.1 F (36.7 C)   Resp 18   Ht 5' 9 (1.753 m)   Wt (!) 173.3 kg   SpO2 98%   BMI 56.41 kg/m     Plan: Initial plan was to discharge home with cardiology follow-up as she does have this scheduled.  However upon attempting to discharge patient, she notes chest pain increased as well as shortness of breath and difficulty breathing.  Will admit to hospitalist for COPD exacerbation and chest pain workup.  Internal medicine agreeable to admit  patient for evaluation, appreciate assistance.      Neysa Thersia RAMAN, PA-C 07/26/24 [id number]    Neysa Thersia RAMAN, PA-C 07/26/24 [id number]    Patsey Lot, MD 07/26/24 [phone]

## 2024-07-26 NOTE — Hospital Course (Addendum)
 Intermittent sharp CP and SOB for 1 week Cards saw Friday, believed to be non-cardiac ECHO and holter ordered Presented for worsening of CP and SOB Hx CP and COPD Very wheezy and tachypneic with movement S/p steroids, toradol , morphine    Albuterol  prn Amitryptiline 100 at bedtime Amlodipine  10 every day Aspirin  81 every day Diazepam  5 q8 prn Bentyl  20 bid Flonase  Trelegy every day Lasix  80 in am and 40 in pm Atarax  10 BID Syndroid 50 every day Meclizine  25 TID prn Meloxicam  150 BID Lopressor  100 BID Zofran  q8 prn Percocet prn Potassium 10 BID Valacyclovir  1000 qd     She reports a one week history of progressive sharp chest pain and then started wheezing on Friday night. On Friday she had to walk outside to her house after being dropped off far from her home. That's when she started wheezing and it progressed through the weekend. She slept for about 12 hours on Saturday night and was awoken by chest pain. The chest pain comes and goes, feels both like stabbing and pressure, is not associated with exertion, and is intermittent. She denies cough, nausea, emesis. She's felt run down for one week with a poor apetite.  Denies allergies. Lives by self, no pets. Has recurrent abdominal pain for which she seeks treatment for often. Has pain in her groin and dysuria, described as on fire. Has trouble emptying bladder but when she is able, the pain goes away.  She sees a urologist, gastroenterologist, cardiologist, pulmonologist.  Reports fluctuating weight. Varyig weight is 368, 374, 369.  Right now she is wheezy and has tight chest pain.   Lives alone, on disability Has a home health aid Dependent in ADLs with home health aide, does IADLs, does not drive by choice No alcohol , smokes about 2 packs per week or 1/4 pack per day since age 53   COPD UTI

## 2024-07-26 NOTE — H&P (Signed)
 " Date: 07/26/2024               Patient Name:  Brianna Roberts MRN: [id number]  DOB: 1965-08-02 Age / Sex: 59 y.o., female   PCP: Suzie Rosaline PEDLAR, NP         Medical Service: Internal Medicine Teaching Service         Attending Physician: Dr. Dayton Eastern      First Contact: Doyal Miyamoto, MD    Second Contact: Dr. Lonni Africa, DO         Pager Information: First Contact Pager: [phone]   Second Contact Pager: [phone]   SUBJECTIVE   Chief Complaint: shortness of breath   History of Present Illness: CARLIA BOMKAMP is a 59 y.o. female with a history of COPD, moderate persistent asthma, diastolic heart failure, OSA on CPAP, HTLV 1, DMII, HTN, hypothyroidism, fibromyalgia, chronic fatigue syndrome, tobacco use disorder, who presents with chest pain and shortness of breath, and admitted for COPD exacerbation without hypoxemic respiratory failure.   Of note, she was recently seen by Cardiology outpatient on 1/30 who did not feel her symptoms of sharp chest pain were cardiac in nature, but possible MSK related. They ordered CCTA with contrast allergy predmedication, and instructions to take her Metoprolol  100 mg 2 hours prior to the study. They also advised her to start aspirin  81 mg daily. Given her history of diastolic heart failure, they ordered repeat Echo.   She reports a one week history of progressive sharp chest pain and then started wheezing on Friday night. On Friday she had to walk outside to her house after being dropped off far from her home. That's when she started wheezing and it progressed through the weekend. She slept for about 12 hours on Saturday night and was awoken by chest pain. The chest pain comes and goes, feels both like stabbing and pressure, is not associated with exertion, and is intermittent. She denies cough, nausea, emesis. She's felt run down for one week with a poor apetite.  Denies allergies. Lives by herself, has no pets. Has recurrent RLQ abdominal  pain for which she seeks treatment for often. Has pain in her groin and dysuria, described as on fire. Has trouble emptying her bladder but when she is able, the pain goes away.  She sees a urologist, gastroenterologist, cardiologist, pulmonologist.  Reports fluctuating weight. Varyig weight is 368, 374, 369 lbs.   She denies recent fevers or chills, sore throat, cough, nausea, or vomiting. She endorses generalized fatigue which she has at baseline, and chest pain that is intermittent and she describes as stabbing, and worsens with palpation. She also notes rattling pain in her chest.   ED Course: Vitals: Blood pressure 104/70, pulse 86, temperature 98.1 F (36.7 C), resp. rate 15, height 5' 9 (1.753 m), weight (!) 173.3 kg, SpO2 96%.  Labs: K 133, glucose 155, otherwise BMP wnl, CBC wnl, Trops negative 6 --> 7, ProBNP < 50.  Imaging:  CXR: Pulmonary vascular congestion without frank edema and stable cardiomegaly. Received: Duoneb, Toradol , SoluMedrol 125 mg, and Morphine  Consulted: IMTS  Meds:  Patient reported: She reports that she last took her medications on Thursday 1/29 - Lasix  as prescribed by pulmonology: 80 mg in AM and 40 mg in PM Monday Wednesday Friday, and 40 mg twice daily Tuesday Thursday Saturday Sunday Albuterol  prn Amitryptiline 100 at bedtime Amlodipine  10 every day Aspirin  81 mg daily Diazepam  5 mg q8 prn Bentyl  20 mg BID Flonase  Trelegy daily  Atarax  10 mg BID Syndroid 50 mg daily Meclizine  25 mg TID prn Meloxicam  150 mg BID Lopressor  100 mg BID Zofran  q8 prn Percocet prn Potassium 10 mg BID Valacyclovir  1000 daily  Active Medications[1]  Past Medical History COPD, moderate persistent asthma, diastolic heart failure, OSA on CPAP, HTLV 1, DMII, HTN, hypothyroidism, fibromyalgia, chronic fatigue syndrome, tobacco use disorder  Past Surgical History Past Surgical History:  Procedure Laterality Date   ABDOMINAL HYSTERECTOMY     Partial-ovaries  and tubes remain   BREAST BIOPSY Left    CHOLECYSTECTOMY  2006   COLONOSCOPY  07/27/2015   [place] MD   HERNIA REPAIR  1997   TUMOR EXCISION Right 1984   Sinuses   TUMOR EXCISION Left 2015   sinuses   URACHAL CYST EXCISION  1997    Social:  Lives With: alone Occupation: On disability  Support: Sister Reena Dimes  Level of Function: dependent in some ADLs, dependent in some iADLs PCP: Suzie Rosaline PEDLAR, NP  Substances: - Tobacco: 0.25 PPD; smoking regularly since 2012 and intermittently since 1984 - Alcohol : None - Recreational Drug: None  Code Status: Full Primary Decision Maker: Sister Reena Dimes Blood Products: Does accept blood products   Family History:  Family History  Problem Relation Age of Onset   Diabetes Mother    Heart disease Mother    Hyperlipidemia Mother    Hypertension Mother    Stroke Mother    Mental illness Mother    Heart failure Mother    Headache Mother        Johnsie Finder Cyst   Breast cancer Mother 15       diagnosed 3 times   Heart disease Father    Alcohol  abuse Father    Hypertension Sister    Heart disease Brother    Mental illness Brother    COPD Brother    Heart failure Brother    Hypertension Sister    Colon cancer Neg Hx      Allergies: Allergies as of 07/26/2024 - Review Complete 07/26/2024  Allergen Reaction Noted   Hydromorphone Itching 03/20/2015   Iodinated contrast media Anaphylaxis, Swelling, and Other (See Comments) 02/01/2017   Sulfa antibiotics Anaphylaxis 09/29/2016   Codeine Other (See Comments) 03/20/2015   Nickel Rash 12/13/2017   Penicillins Rash and Other (See Comments) 03/20/2015   Spironolactone Nausea Only and Other (See Comments) 03/20/2015   Cefdinir Hives 07/17/2022   Adhesive [tape] Rash 12/13/2017    Review of Systems: A complete ROS was negative except as per HPI.   OBJECTIVE:   Physical Exam: Blood pressure 104/70, pulse 86, temperature 98.1 F (36.7 C), resp. rate 15,  height 5' 9 (1.753 m), weight (!) 173.3 kg, SpO2 96%.   Constitutional: obese-appearing female sitting in hospital bed, in no acute distress but winded during interview  HEENT: normocephalic atraumatic, mucous membranes moist, no thrush, uvula midline and symmetric  Eyes: conjunctiva non-erythematous Neck: supple, right parotid gland swollen on palpation  Cardiovascular: regular rate and rhythm, bilateral radial pulses 2+, bilateral dorsal pedal pulses 2+, brisk capillary refill bilateral feet and hands, 1+ edema bilateral lower extremities to shins  Pulmonary/Chest: normal work of breathing on room air, lungs generally clear to auscultation bilaterally with mild upper airway wheeze Abdominal: soft, non-tender, non-distended; RLQ swelling and with no skin changes of pannus folds  MSK: normal bulk and tone. Neurological: alert & oriented x 3 Skin: warm and dry, no ulcers or lesions on bilateral feet Psych: mood calm, behavior  normal, thought content normal, judgement normal    Labs: CBC    Component Value Date/Time   WBC 8.8 07/26/2024 0601   RBC 4.41 07/26/2024 0601   HGB 12.3 07/26/2024 0601   HGB 13.9 06/13/2023 1053   HCT 37.7 07/26/2024 0601   HCT 42.8 06/13/2023 1053   PLT 307 07/26/2024 0601   PLT 367 06/13/2023 1053   MCV 85.5 07/26/2024 0601   MCV 84 06/13/2023 1053   MCH 27.9 07/26/2024 0601   MCHC 32.6 07/26/2024 0601   RDW 13.5 07/26/2024 0601   RDW 13.2 06/13/2023 1053   LYMPHSABS 3.5 07/26/2024 0601   LYMPHSABS 3.7 (H) 08/09/2017 1059   MONOABS 0.6 07/26/2024 0601   EOSABS 0.2 07/26/2024 0601   EOSABS 0.2 08/09/2017 1059   BASOSABS 0.1 07/26/2024 0601   BASOSABS 0.1 08/09/2017 1059     CMP     Component Value Date/Time   NA 142 07/26/2024 0601   NA 144 06/24/2024 0945   K 3.3 (L) 07/26/2024 0601   CL 106 07/26/2024 0601   CO2 25 07/26/2024 0601   GLUCOSE 155 (H) 07/26/2024 0601   BUN 7 07/26/2024 0601   BUN 8 06/24/2024 0945   CREATININE 0.91  07/26/2024 0601   CREATININE 0.87 04/27/2017 1654   CALCIUM 8.9 07/26/2024 0601   PROT 6.5 07/26/2024 0601   PROT 7.0 03/26/2023 1109   ALBUMIN 3.9 07/26/2024 0601   ALBUMIN 4.4 03/26/2023 1109   AST 20 07/26/2024 0601   ALT 22 07/26/2024 0601   ALKPHOS 67 07/26/2024 0601   BILITOT 0.9 07/26/2024 0601   BILITOT 0.7 03/26/2023 1109   GFRNONAA >60 07/26/2024 0601   GFRAA >60 11/04/2019 1617    Imaging:  DG Chest Portable 1 View Result Date: 07/26/2024 EXAM: 1 VIEW(S) XRAY OF THE CHEST 07/26/2024 06:22:38 AM COMPARISON: 05/04/2024 CLINICAL HISTORY: Chest pain. FINDINGS: LUNGS AND PLEURA: Pulmonary vascular congestion without frank edema. No pleural effusion. No pneumothorax. HEART AND MEDIASTINUM: Stable cardiomegaly. BONES AND SOFT TISSUES: No acute osseous abnormality. IMPRESSION: 1. Pulmonary vascular congestion without frank edema. 2. Stable cardiomegaly. Electronically signed by: Waddell Calk MD 07/26/2024 06:24 AM EST RP Workstation: [id number]     EKG: personally reviewed my interpretation is NSR, no evidence of acute ischemia, borderline prolonged QTc (499 ms). Prior EKG similar  ASSESSMENT & PLAN:   Assessment & Plan by Problem: Principal Problem:   COPD exacerbation (HCC)   Cambrea A Vondra is a 59 y.o. female with a history of COPD, moderate persistent asthma, diastolic heart failure, OSA on CPAP, HTLV 1, DMII, HTN, hypothyroidism, fibromyalgia, chronic fatigue syndrome, tobacco use disorder, who presents with chest pain and shortness of breath, and admitted for COPD exacerbation without hypoxemic respiratory failure on hospital day 0  #COPD Exacerbation without hypoxemic respiratory failure  #History of moderate persistent asthma  Patient with a history of COPD not on chronic oxygen at home. She presented following concerns of chest pain and cardiac workup negative for evidence of CHF exacerbation or MI. EDP felt that lungs were wheezy on exam, she received Duoneb and  SoluMedrol. She does not have an increased oxygen requirement and does not have a cough productive of sputum. She was initially going to be discharged from the ED to home following Duoneb treatments, but as she continued to be SOB, patient felt more comfortable with admission. On physical exam, lungs sound clear with no sounds of crackles or wheezing, but perhaps some upper airway involvement. O2 sat remained > 94% at  rest, with talking, and with movement/walking, though she reported being SOB. Plan to admit for COPD exacerbation and observation.  - s/p Solu-Medrol   - Duoneb q6 hrs - Albuterol  nebulizer q2 hrs prn  - Continue home Breo ellipta  (Trelegy not on formulary) 1 puff daily - Oral prednisone  40 mg for 4 days, expected EOT 2/5 - Hold home Metoprolol  100 mg BID iso possible COPD exacerbation and bronchoconstriction  - f/u RPP  - SpO2 goal > 88%   #Chest Pain #Fibromyalgia  Chest pain which was evaluated in outpatient Cardiology setting and on presentation to ED. EKG without evidence of acute ischemia, Trops negative and flat (6 --> 7), agree that this is likely MSK in nature such as costochondritis. She does have a history of fibromyalgia and notes that she does have pain at baseline as well. - Lidocaine  patch - Voltaren  gel - Aquathermia prn  - s/p Toradol    #Dysuria Patient reports she has intermittent episodes of difficulty emptying her bladder, but noted some burning with urination on 1/30. WBC reassuringly wnl and afebrile.  - f/u UA   #Hypokalemia Labs on admission showed K of 3.3. She takes home Potassium 10 mEq twice daily. Will plan to resume this and monitor daily on BMP.  - daily BMP - Continue home Potassium 10 mEq BID   #Chronic Diastolic Heart Failure  11/2017 Echo with EF of 60 - 65% and grade 1 diastolic dysfunction. ProBNP of < 50. JVD difficult to assess given body habitus. Bilateral lower extremities with 1+ pitting edema to shins. Lungs are generally clear to  auscultation, though CXR showed pulmonary vascular congestion without frank edema. She just saw Cardiology outpatient on 1/30 who ordered repeat Echo to assess current cardiac function, though this had not been completed yet. Do not suspect that she is currently in HF exacerbation and will plan to resume home Lasix  regimen that was recently prescribed by Pulmonology.  - Continue home Lasix  80 mg in AM and 40 mg in PM Monday Wednesday Friday, and 40 mg twice daily Tuesday Thursday Saturday Sunday - Hold home Metoprolol  100 mg BID iso possible COPD exacerbation and bronchoconstriction   #Physical Deconditioning  #Morbid Obesity  #Chronic Fatigue Syndrome  #IBS Patient has a history of chronic fatigue syndrome, morbid obesity and has noticed an overall decline in her physical conditioning. When walking with RN in the ED, she had no desaturations but did report feeling SOB. She takes home Bentyl  20 mg twice daily for her IBS.  - PT/OT consult  - Continue home Bentyl  20 mg BID   #DMII Most recent A1c in 04/21/2024 was 6.5%. Currently diet-controlled. May be good candidate for GLP-1 given her diabetes and morbid obesity to help with weight reduction.  - Resistant SSI while admitted  - Recommend considering GLP-1 outpatient   #HTN History of hypertension controlled with Amlodipine  10 mg daily, Metoprolol  100 mg BID, and Lasix  80 mg in AM and 40 mg in PM Monday Wednesday Friday, and 40 mg twice daily Tuesday Thursday Saturday Sunday. Will plan to resume home Amlodipine  and Lasix  for now. Will hold home Metoprolol  iso possible COPD exacerbation and bronchoconstriction.  - Continue home Amlodipine  10 mg daily - Continue Lasix  80 mg in AM and 40 mg in PM Monday Wednesday Friday, and 40 mg twice daily Tuesday Thursday Saturday Sunday - Hold home Metoprolol  100 mg BID iso possible COPD exacerbation and bronchoconstriction   #Tobacco Use Disorder  Currently smoking 0.25 PPD; smoking regularly since 2012  and intermittently since 1984. Briefly discussed smoking cessation and overall lung health. Recommended that she discuss further with PCP regarding Chantix.  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   #HSV2 - Continue home Valacyclovir  1000 mg daily   #Anxiety  #Depression Sees behavioral therapy twice weekly. Takes home Amitryptiline 100 at bedtime prn, home Diazepam  5 mg q8 prn, and Atarax  10 mg BID prn, though these are as needed.   Best practice: Diet: Carb-Modified/Heart Healthy  VTE: Enoxaparin  Code: Full  Disposition planning: Prior to Admission Living Arrangement: Home, living alone Anticipated Discharge Location: Home  Dispo: Admit patient to Observation with expected length of stay less than 2 midnights.  Signed: Doyal Miyamoto, MD Internal Medicine Resident  07/26/2024, 12:09 PM  On Call pager: [phone]     [1]  Current Meds  Medication Sig   albuterol  (VENTOLIN  HFA) 108 (90 Base) MCG/ACT inhaler Inhale 1-2 puffs into the lungs every 6 (six) hours as needed for wheezing or shortness of breath.   amitriptyline  (ELAVIL ) 100 MG tablet Take 1 tablet (100 mg total) by mouth at bedtime.   amLODipine  (NORVASC ) 10 MG tablet Take 1 tablet (10 mg total) by mouth daily.   aspirin  EC 81 MG tablet Take 1 tablet (81 mg total) by mouth daily. Swallow whole.   clindamycin (CLINDAGEL) 1 % gel Apply 1 Application topically 2 (two) times daily as needed (rash).   diazepam  (VALIUM ) 2 MG tablet Take 2 mg by mouth every 8 (eight) hours as needed for muscle spasms or anxiety.   fluticasone  (FLONASE ) 50 MCG/ACT nasal spray Place 1 spray into both  nostrils daily.   furosemide  (LASIX ) 40 MG tablet Please take 2 tablets in the morning and 1 tablet in the evening Mondays, Wednesdays, and Fridays and take 1 tablet in the morning and 1 tablet in the evening on Tuesdays, Thursdays, Saturdays, and Sundays. (Patient taking differently: Take 20-40 mg by mouth See admin instructions. Take 2 tablets by mouth in the morning and 1 tablet by mouth in the evening on Mondays, Wednesdays, and Fridays and then take 1 tablet by mouth in the morning and 1 tablet by mouth  in the evening on Tuesdays, Thursdays, Saturdays, and Sundays per patient)   hydrOXYzine  (ATARAX ) 10 MG tablet Take 10 mg by mouth 2 (two) times daily.   ipratropium-albuterol  (DUONEB) 0.5-2.5 (3) MG/3ML SOLN Inhale 3 mLs into the lungs every 6 (six) hours as needed (shortness of breath).   levothyroxine  (SYNTHROID ) 50 MCG tablet Take 1 tablet (50 mcg total) by mouth daily before breakfast.   meclizine  (ANTIVERT ) 25 MG tablet Take 1 tablet (25 mg total) by mouth 3 (three) times daily as needed for dizziness.   meloxicam  (MOBIC ) 7.5 MG tablet TAKE 2 TABLETS BY MOUTH IN THE MORNING AND AT BEDTIME   metoprolol  tartrate (LOPRESSOR ) 100 MG tablet Take 1 tablet (100 mg total) by mouth 2 (two) times daily.   nystatin  (MYCOSTATIN /NYSTOP ) powder Apply 1 Application topically 2 (two) times daily.   nystatin  cream (MYCOSTATIN ) Apply 1 Application topically 2 (two) times daily.   ondansetron  (ZOFRAN -ODT) 8 MG disintegrating tablet Take 1 tablet (  8 mg total) by mouth every 8 (eight) hours as needed for nausea or vomiting.   oxyCODONE -acetaminophen  (PERCOCET) 10-325 MG tablet Take 1 tablet by mouth daily as needed.   potassium chloride  SA (KLOR-CON  M) 20 MEQ tablet Take 20 mEq by mouth daily.   predniSONE  (DELTASONE ) 20 MG tablet Take 2 tablets (40 mg total) by mouth daily for 5 days.   Triamcinolone  Acetonide (TRIAMCINOLONE  0.1 % CREAM : EUCERIN) CREA Apply 1 Application topically daily as needed. (Patient  taking differently: Apply 1 Application topically daily as needed for rash or itching.)   valACYclovir  (VALTREX ) 1000 MG tablet Take 1 tablet (1,000 mg total) by mouth daily.   "

## 2024-07-26 NOTE — ED Triage Notes (Signed)
 Pt BIB [facility] from home after being woken from sleep about 90 mins ago with 10/1 sharp chest pain radiating to Left jaw and shortness of breath. Pt denies nausea, vomiting, syncope, dizziness or weakness. Pt received 5 nitros and 324  ASA en route and pain was 8/10.   EMS Vitals  136/80 88 HR 95% RA 18 RR

## 2024-07-27 ENCOUNTER — Encounter: Payer: Self-pay | Admitting: Internal Medicine

## 2024-07-27 ENCOUNTER — Other Ambulatory Visit (HOSPITAL_COMMUNITY): Payer: Self-pay

## 2024-07-27 ENCOUNTER — Encounter (HOSPITAL_BASED_OUTPATIENT_CLINIC_OR_DEPARTMENT_OTHER): Payer: Self-pay | Admitting: Pulmonary Disease

## 2024-07-27 LAB — BASIC METABOLIC PANEL WITH GFR
Anion gap: 15 (ref 5–15)
BUN: 12 mg/dL (ref 6–20)
CO2: 21 mmol/L — ABNORMAL LOW (ref 22–32)
Calcium: 8.9 mg/dL (ref 8.9–10.3)
Chloride: 101 mmol/L (ref 98–111)
Creatinine, Ser: 1.1 mg/dL — ABNORMAL HIGH (ref 0.44–1.00)
GFR, Estimated: 58 mL/min — ABNORMAL LOW
Glucose, Bld: 252 mg/dL — ABNORMAL HIGH (ref 70–99)
Potassium: 3.9 mmol/L (ref 3.5–5.1)
Sodium: 137 mmol/L (ref 135–145)

## 2024-07-27 LAB — CBC
HCT: 40.3 % (ref 36.0–46.0)
Hemoglobin: 12.8 g/dL (ref 12.0–15.0)
MCH: 27.1 pg (ref 26.0–34.0)
MCHC: 31.8 g/dL (ref 30.0–36.0)
MCV: 85.2 fL (ref 80.0–100.0)
Platelets: 334 10*3/uL (ref 150–400)
RBC: 4.73 MIL/uL (ref 3.87–5.11)
RDW: 13.3 % (ref 11.5–15.5)
WBC: 16.1 10*3/uL — ABNORMAL HIGH (ref 4.0–10.5)
nRBC: 0 % (ref 0.0–0.2)

## 2024-07-27 LAB — GLUCOSE, CAPILLARY
Glucose-Capillary: 287 mg/dL — ABNORMAL HIGH (ref 70–99)
Glucose-Capillary: 189 mg/dL — ABNORMAL HIGH (ref 70–99)

## 2024-07-27 MED ORDER — OXYCODONE HCL 5 MG PO TABS
5.0000 mg | ORAL_TABLET | Freq: Every day | ORAL | Status: DC | PRN
  Administered 2024-07-27: 5 mg via ORAL
  Filled 2024-07-27: qty 1

## 2024-07-27 MED ORDER — SENNOSIDES-DOCUSATE SODIUM 8.6-50 MG PO TABS: 1.0000 | ORAL_TABLET | Freq: Two times a day (BID) | ORAL | Status: DC

## 2024-07-27 MED ORDER — ACETAMINOPHEN 650 MG RE SUPP: 650.0000 mg | Freq: Four times a day (QID) | RECTAL | Status: DC

## 2024-07-27 MED ORDER — PREDNISONE 20 MG PO TABS
40.0000 mg | ORAL_TABLET | Freq: Every day | ORAL | 0 refills | Status: AC
  Filled 2024-07-27 (×2): qty 6, 3d supply, fill #0

## 2024-07-27 MED ORDER — ALBUTEROL SULFATE (2.5 MG/3ML) 0.083% IN NEBU
2.5000 mg | INHALATION_SOLUTION | RESPIRATORY_TRACT | 12 refills | Status: AC | PRN
  Filled 2024-07-27: qty 90, 3d supply, fill #0

## 2024-07-27 MED ORDER — OXYCODONE-ACETAMINOPHEN 10-325 MG PO TABS: 1.0000 | ORAL_TABLET | Freq: Every day | ORAL | Status: DC | PRN

## 2024-07-27 MED ORDER — POLYETHYLENE GLYCOL 3350 17 G PO PACK: 17.0000 g | PACK | Freq: Every day | ORAL | Status: DC

## 2024-07-27 MED ORDER — ACETAMINOPHEN 325 MG PO TABS
650.0000 mg | ORAL_TABLET | Freq: Four times a day (QID) | ORAL | Status: DC
  Administered 2024-07-27: 650 mg via ORAL
  Filled 2024-07-27: qty 2

## 2024-07-27 MED ORDER — OXYCODONE-ACETAMINOPHEN 5-325 MG PO TABS
1.0000 | ORAL_TABLET | Freq: Every day | ORAL | Status: DC | PRN
  Administered 2024-07-27: 1 via ORAL
  Filled 2024-07-27: qty 1

## 2024-07-27 MED ORDER — NAPROXEN 250 MG PO TABS
375.0000 mg | ORAL_TABLET | Freq: Two times a day (BID) | ORAL | Status: DC | PRN
  Filled 2024-07-27: qty 1
  Filled 2024-07-27: qty 2

## 2024-07-27 MED ORDER — TAMSULOSIN HCL 0.4 MG PO CAPS: 0.4000 mg | ORAL_CAPSULE | Freq: Every day | ORAL | Status: DC

## 2024-07-27 NOTE — Care Management Obs Status (Signed)
 MEDICARE OBSERVATION STATUS NOTIFICATION   Patient Details  Name: Brianna Roberts MRN: [id number] Date of Birth: 07/31/1965   Medicare Observation Status Notification Given:  Yes    Marval Gell, RN 07/27/2024, 8:48 AM

## 2024-07-27 NOTE — Progress Notes (Signed)
 Physical Therapy Treatment Patient Details Name: Brianna Roberts MRN: [id number] DOB: May 20, 1966 Today's Date: 07/27/2024   History of Present Illness The pt is a 59 yo female presenting 2/1 with SOB and sharp chest pain.  Admitted for COPD exacerbation and chest pain management. PMH includes: CHF, COPD, tobacco use, fibromyalgia, obesity (BMI 57), arthritis, anxiety, HTN, TIA, thyroid  disease, and HIV.    PT Comments  Pt seated on EOB with increased RR and SOB, HR 111 and spO2 96% on RA. PT provided education and cues for PLB and calming strategies. Reviewed current medical status and mobility. Pt ambulates to bathroom at SPV, intermittent furniture surfing using counter. Fair balance noted with all activities; requests to remain seated in bathroom for longer period of time due to increased frequency with lasix  medications.  PT reviewed energy conservation strategies and activity modifications with pt. PT with concerns regarding pt's continued elevated HR and increased RR at rest and with light activity. PT discussed concerns with RN as well, unsure if medically ready for discharge. PT will continue to follow while pt is admitted to acute.    If plan is discharge home, recommend the following: A little help with walking and/or transfers;A lot of help with bathing/dressing/bathroom;Assist for transportation;Help with stairs or ramp for entrance;Supervision due to cognitive status   Can travel by private vehicle        Equipment Recommendations  None recommended by PT    Recommendations for Other Services       Precautions / Restrictions Precautions Precautions: Fall Recall of Precautions/Restrictions: Intact Precaution/Restrictions Comments: watch HR and SpO2, on RA Restrictions Weight Bearing Restrictions Per Provider Order: No     Mobility  Bed Mobility Overal bed mobility: Modified Independent             General bed mobility comments: requires use of handrail, increased  time and effort secondary to decreased cardiovascular endurance    Transfers Overall transfer level: Needs assistance Equipment used: None Transfers: Sit to/from Stand Sit to Stand: Contact guard assist           General transfer comment: CGA for safety from EOB, stands in forward felxed posture secondary to pain. Ambulates to bathroom with slight flexed posture, supports self on sink. Lowers with control to toilet using grab bar at SPV. Requests to remain on toilet due to frequency with lasix .    Ambulation/Gait Ambulation/Gait assistance: Contact guard assist Gait Distance (Feet): 20 Feet Assistive device: None   Gait velocity: decreased     General Gait Details: ambulates to bathroom, intermittent single UE support on sink while walking past which pt reports furniture surfing at baseline. Fair balance noted, forward flexed posture secondary to abdominal pain   Stairs             Wheelchair Mobility     Tilt Bed    Modified Rankin (Stroke Patients Only)       Balance Overall balance assessment: Needs assistance Sitting-balance support: Feet supported Sitting balance-Leahy Scale: Good     Standing balance support: During functional activity, Single extremity supported Standing balance-Leahy Scale: Fair                              Hotel Manager: No apparent difficulties  Cognition Arousal: Alert Behavior During Therapy: WFL for tasks assessed/performed   PT - Cognitive impairments: No family/caregiver present to determine baseline  PT - Cognition Comments: pt appears to be at her baseline, anxious about medical condition but able to answer questions. Following commands: Intact      Cueing Cueing Techniques: Verbal cues, Tactile cues  Exercises      General Comments General comments (skin integrity, edema, etc.): spO2 >90% during session on RA. Pt with elevated HR at rest to  110, increased with activity to 120s. Educated pt on energy conservation, seated rest breaks at home, PLB, leaving items on counter that she uses daily, activity modification and prioritizing tasks based on her energy level      Pertinent Vitals/Pain Pain Assessment Pain Assessment: Faces Faces Pain Scale: Hurts even more Pain Location: lower abdomen on L side Pain Descriptors / Indicators: Grimacing, Guarding Pain Intervention(s): Limited activity within patient's tolerance, Monitored during session, Repositioned    Home Living Family/patient expects to be discharged to:: Private residence Living Arrangements: Alone Available Help at Discharge:  (reports no assist) Type of Home: House Home Access: Stairs to enter Entrance Stairs-Rails: None Entrance Stairs-Number of Steps: 3 Alternate Level Stairs-Number of Steps: 13 Home Layout: Two level;Bed/bath upstairs Home Equipment: Pharmacist, Hospital (2 wheels);Rollator (4 wheels);Cane - quad;Hand held shower head;Toilet riser Additional Comments: has HHaides who are supposed to come 1x/week to assist with bathing, pt cannot complete on her own. Pt reports she plans to move soon as she has been breathing in black mold for the past 6 years.    Prior Function            PT Goals (current goals can now be found in the care plan section) Acute Rehab PT Goals Patient Stated Goal: to improve breathing PT Goal Formulation: With patient Time For Goal Achievement: 08/09/24 Potential to Achieve Goals: Fair Progress towards PT goals: Progressing toward goals    Frequency    Min 2X/week      PT Plan      Co-evaluation              AM-PAC PT 6 Clicks Mobility   Outcome Measure  Help needed turning from your back to your side while in a flat bed without using bedrails?: None Help needed moving from lying on your back to sitting on the side of a flat bed without using bedrails?: None Help needed moving to and from a bed  to a chair (including a wheelchair)?: A Little Help needed standing up from a chair using your arms (e.g., wheelchair or bedside chair)?: A Little Help needed to walk in hospital room?: A Lot Help needed climbing 3-5 steps with a railing? : A Lot 6 Click Score: 18    End of Session Equipment Utilized During Treatment: Gait belt Activity Tolerance: Patient limited by pain;Other (comment) (pt HR elevated throughout session even when seated at rest on EOB, 110-123) Patient left: Other (comment) (left in bathroom per pt request to remain seated there due to urinary frequency, RN and NT aware of pt position) Nurse Communication: Mobility status;Other (comment) (concerns for cardiopulmonary status with pending discharge) PT Visit Diagnosis: Unsteadiness on feet (R26.81);Muscle weakness (generalized) (M62.81);Pain Pain - Right/Left: Left Pain - part of body:  (lower abdomen)     Time: 8871-8858 PT Time Calculation (min) (ACUTE ONLY): 13 min  Charges:    $Therapeutic Activity: 8-22 mins                       Isaiah DEL. Irie Dowson, PT, DPT   [employer] 07/27/2024, 11:49 AM

## 2024-07-27 NOTE — TOC Initial Note (Signed)
 Transition of Care [facility]) - Initial/Assessment Note    Patient Details  Name: Brianna Roberts MRN: [id number] Date of Birth: Aug 19, 1965  [facility] CM/SW Contact:    Marval Gell, RN Phone Number: 07/27/2024, 8:51 AM  Clinical Narrative:                  Patient in obs for SOB, on RA. SPoke w patient and she is active for [facility] services with [facility], who has already accepted in [place], [facility] referral completed in [facility]. HH ROS orders for PT OT RN HHA added.  Patient states that she has a rollator a RW and a shower seat at home. She states that she will need transportation home    Expected Discharge Plan: Home w Home Health Services Barriers to Discharge: Continued Medical Work up   Patient Goals and CMS Choice Patient states their goals for this hospitalization and ongoing recovery are:: to go home CMS Medicare.gov Compare Post Acute Care list provided to:: Patient        Expected Discharge Plan and Services   Discharge Planning Services: CM Consult Post Acute Care Choice: Home Health Living arrangements for the past 2 months: Apartment                           HH Arranged: RN, PT, OT, Nurse's Aide HH Agency: [facility] Date [facility] Agency Contacted: 07/27/24 Time HH Agency Contacted: [phone] Representative spoke with at [facility] Agency: [facility]  Prior Living Arrangements/Services Living arrangements for the past 2 months: Apartment Lives with:: Self                   Activities of Daily Living   ADL Screening (condition at time of admission) Independently performs ADLs?: Yes (appropriate for developmental age) Is the patient deaf or have difficulty hearing?: No Does the patient have difficulty seeing, even when wearing glasses/contacts?: No Does the patient have difficulty concentrating, remembering, or making decisions?: No  Permission Sought/Granted                  Emotional Assessment              Admission diagnosis:  COPD exacerbation (HCC)  [J44.1] Chest pain, unspecified type [R07.9] Patient Active Problem List   Diagnosis Date Noted   Physical deconditioning 11/14/2023   Chronic left shoulder pain 10/29/2023   Healthcare maintenance 06/13/2023   Positive HTLV-1 antibody 04/22/2023   Type 2 diabetes mellitus without complication, without long-term current use of insulin  (HCC) 03/28/2023   Parotid swelling 03/28/2023   COPD exacerbation (HCC) 01/23/2023   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 [place] [address] Phone: [phone] Fax: [phone]  [facility] [address] [place] [address] Phone: [phone] Fax: [phone]  [facility] [place], [place] - [address] [facility] [address] [place] [address] Phone: [phone] Fax: [phone]     Social Drivers  of Health (SDOH) Social History: SDOH Screenings   Food Insecurity: Food Insecurity Present (07/26/2024)  Housing: Low Risk (07/26/2024)  Recent Concern: Housing - High Risk (05/21/2024)   Received from [facility]  Transportation Needs: Unmet Transportation Needs (07/26/2024)  Utilities: Not At Risk (07/26/2024)  Recent Concern: Utilities - At Risk (05/21/2024)   Received from [facility]  Depression (PHQ2-9): High Risk (08/30/2023)  Financial Resource Strain: High Risk (05/21/2024)   Received from [facility]  Physical Activity: Inactive (05/21/2024)   Received from [facility]  Social  Connections: Socially Isolated (05/21/2024)   Received from [facility]  Stress: Stress Concern Present (05/21/2024)   Received from [facility]  Tobacco Use: High Risk (07/26/2024)   SDOH Interventions: Food Insecurity Interventions: Programmer, Applications Provided, Inpatient [employer] Housing Interventions: [employer] Provided, Inpatient [employer] Transportation Interventions: [employer] Provided, Inpatient TOC Utilities Interventions: [employer] Provided, Inpatient TOC Social Connections Interventions: Inpatient TOC, Community Resources Provided   Readmission Risk Interventions     No data to display

## 2024-07-27 NOTE — Telephone Encounter (Signed)
 Please advise.

## 2024-08-05 ENCOUNTER — Ambulatory Visit (HOSPITAL_COMMUNITY)

## 2024-08-07 ENCOUNTER — Encounter (HOSPITAL_BASED_OUTPATIENT_CLINIC_OR_DEPARTMENT_OTHER): Admitting: Radiology

## 2024-08-07 DIAGNOSIS — Z1231 Encounter for screening mammogram for malignant neoplasm of breast: Secondary | ICD-10-CM

## 2024-08-12 ENCOUNTER — Encounter (HOSPITAL_BASED_OUTPATIENT_CLINIC_OR_DEPARTMENT_OTHER)

## 2024-08-12 ENCOUNTER — Ambulatory Visit (HOSPITAL_BASED_OUTPATIENT_CLINIC_OR_DEPARTMENT_OTHER): Admitting: Pulmonary Disease

## 2024-09-01 ENCOUNTER — Ambulatory Visit (HOSPITAL_BASED_OUTPATIENT_CLINIC_OR_DEPARTMENT_OTHER): Admitting: Pulmonary Disease

## 2024-09-03 ENCOUNTER — Ambulatory Visit (HOSPITAL_COMMUNITY)
